# Patient Record
Sex: Male | Born: 1950
Health system: Southern US, Community
[De-identification: ages and names within clinical notes are randomized; demographics above are authoritative.]

## PROBLEM LIST (undated history)

## (undated) DIAGNOSIS — E119 Type 2 diabetes mellitus without complications: Secondary | ICD-10-CM

## (undated) DIAGNOSIS — R768 Other specified abnormal immunological findings in serum: Secondary | ICD-10-CM

## (undated) DIAGNOSIS — E669 Obesity, unspecified: Secondary | ICD-10-CM

## (undated) DIAGNOSIS — Z9119 Patient's noncompliance with other medical treatment and regimen: Secondary | ICD-10-CM

## (undated) DIAGNOSIS — R9389 Abnormal findings on diagnostic imaging of other specified body structures: Secondary | ICD-10-CM

## (undated) DIAGNOSIS — E785 Hyperlipidemia, unspecified: Secondary | ICD-10-CM

## (undated) DIAGNOSIS — I10 Essential (primary) hypertension: Secondary | ICD-10-CM

## (undated) DIAGNOSIS — I739 Peripheral vascular disease, unspecified: Secondary | ICD-10-CM

## (undated) DIAGNOSIS — M109 Gout, unspecified: Secondary | ICD-10-CM

## (undated) DIAGNOSIS — Z91199 Patient's noncompliance with other medical treatment and regimen due to unspecified reason: Secondary | ICD-10-CM

## (undated) DIAGNOSIS — Z9289 Personal history of other medical treatment: Secondary | ICD-10-CM

## (undated) HISTORY — DX: Obesity, unspecified: E66.9

## (undated) HISTORY — DX: Abnormal findings on diagnostic imaging of other specified body structures: R93.89

## (undated) HISTORY — DX: Other specified abnormal immunological findings in serum: R76.8

## (undated) HISTORY — DX: Peripheral vascular disease, unspecified: I73.9

## (undated) HISTORY — DX: Hyperlipidemia, unspecified: E78.5

## (undated) HISTORY — DX: Gout, unspecified: M10.9

## (undated) HISTORY — DX: Patient's noncompliance with other medical treatment and regimen due to unspecified reason: Z91.199

## (undated) HISTORY — DX: Patient's noncompliance with other medical treatment and regimen: Z91.19

## (undated) HISTORY — PX: BALLOON ANGIOPLASTY, ARTERY: SHX564

## (undated) HISTORY — DX: Personal history of other medical treatment: Z92.89

---

## 2014-08-26 ENCOUNTER — Emergency Department (HOSPITAL_COMMUNITY): Payer: Self-pay

## 2014-08-26 ENCOUNTER — Emergency Department (HOSPITAL_COMMUNITY)
Admission: EM | Admit: 2014-08-26 | Discharge: 2014-08-26 | Disposition: A | Discharge status: Home / Self Care | Payer: Self-pay | Attending: Emergency Medicine | Admitting: Emergency Medicine

## 2014-08-26 ENCOUNTER — Encounter (HOSPITAL_COMMUNITY): Payer: Self-pay | Admitting: Emergency Medicine

## 2014-08-26 DIAGNOSIS — I10 Essential (primary) hypertension: Secondary | ICD-10-CM | POA: Insufficient documentation

## 2014-08-26 DIAGNOSIS — R0789 Other chest pain: Secondary | ICD-10-CM | POA: Insufficient documentation

## 2014-08-26 DIAGNOSIS — E669 Obesity, unspecified: Secondary | ICD-10-CM | POA: Insufficient documentation

## 2014-08-26 DIAGNOSIS — I7090 Unspecified atherosclerosis: Secondary | ICD-10-CM | POA: Insufficient documentation

## 2014-08-26 DIAGNOSIS — R51 Headache: Secondary | ICD-10-CM | POA: Insufficient documentation

## 2014-08-26 DIAGNOSIS — R519 Headache, unspecified: Secondary | ICD-10-CM

## 2014-08-26 DIAGNOSIS — E119 Type 2 diabetes mellitus without complications: Secondary | ICD-10-CM | POA: Insufficient documentation

## 2014-08-26 DIAGNOSIS — R079 Chest pain, unspecified: Secondary | ICD-10-CM | POA: Insufficient documentation

## 2014-08-26 DIAGNOSIS — R109 Unspecified abdominal pain: Secondary | ICD-10-CM | POA: Insufficient documentation

## 2014-08-26 HISTORY — DX: Type 2 diabetes mellitus without complications: E11.9

## 2014-08-26 HISTORY — DX: Essential (primary) hypertension: I10

## 2014-08-26 LAB — CBC WITH DIFFERENTIAL/PLATELET
Basophils Absolute: 0.1 10*3/uL (ref 0.0–0.1)
Basophils Relative: 1 % (ref 0–1)
Eosinophils Absolute: 0.1 10*3/uL (ref 0.0–0.7)
Eosinophils Relative: 2 % (ref 0–5)
HEMATOCRIT: 45.5 % (ref 39.0–52.0)
Hemoglobin: 15.1 g/dL (ref 13.0–17.0)
LYMPHS PCT: 33 % (ref 12–46)
Lymphs Abs: 2.2 10*3/uL (ref 0.7–4.0)
MCH: 25.2 pg — ABNORMAL LOW (ref 26.0–34.0)
MCHC: 33.2 g/dL (ref 30.0–36.0)
MCV: 75.8 fL — AB (ref 78.0–100.0)
MONO ABS: 0.5 10*3/uL (ref 0.1–1.0)
MONOS PCT: 7 % (ref 3–12)
NEUTROS ABS: 3.8 10*3/uL (ref 1.7–7.7)
Neutrophils Relative %: 57 % (ref 43–77)
Platelets: 197 10*3/uL (ref 150–400)
RBC: 6 MIL/uL — ABNORMAL HIGH (ref 4.22–5.81)
RDW: 15 % (ref 11.5–15.5)
WBC: 6.6 10*3/uL (ref 4.0–10.5)

## 2014-08-26 LAB — COMPREHENSIVE METABOLIC PANEL
ALT: 20 U/L (ref 0–53)
ANION GAP: 17 — AB (ref 5–15)
AST: 21 U/L (ref 0–37)
Albumin: 3.7 g/dL (ref 3.5–5.2)
Alkaline Phosphatase: 123 U/L — ABNORMAL HIGH (ref 39–117)
BUN: 14 mg/dL (ref 6–23)
CO2: 23 meq/L (ref 19–32)
CREATININE: 1.18 mg/dL (ref 0.50–1.35)
Calcium: 9.3 mg/dL (ref 8.4–10.5)
Chloride: 100 mEq/L (ref 96–112)
GFR, EST AFRICAN AMERICAN: 75 mL/min — AB (ref >90)
GFR, EST NON AFRICAN AMERICAN: 64 mL/min — AB (ref >90)
Glucose, Bld: 102 mg/dL — ABNORMAL HIGH (ref 70–99)
Potassium: 3.8 mEq/L (ref 3.7–5.3)
Sodium: 140 mEq/L (ref 137–147)
Total Bilirubin: 0.4 mg/dL (ref 0.3–1.2)
Total Protein: 7.7 g/dL (ref 6.0–8.3)

## 2014-08-26 LAB — TROPONIN I

## 2014-08-26 LAB — PRO B NATRIURETIC PEPTIDE: Pro B Natriuretic peptide (BNP): 21.5 pg/mL (ref 0–125)

## 2014-08-26 LAB — LIPASE, BLOOD: Lipase: 30 U/L (ref 11–59)

## 2014-08-26 MED ORDER — HYDROCHLOROTHIAZIDE 25 MG PO TABS: 25.0000 mg | ORAL_TABLET | Freq: Every day | ORAL | Status: DC

## 2014-08-26 MED ORDER — OXYCODONE-ACETAMINOPHEN 5-325 MG PO TABS: 1.0000 | ORAL_TABLET | ORAL | Status: DC | PRN

## 2014-08-26 MED ORDER — SODIUM CHLORIDE 0.9 % IV BOLUS (SEPSIS)
1000.0000 mL | Freq: Once | INTRAVENOUS | Status: AC
  Administered 2014-08-26: 1000 mL via INTRAVENOUS

## 2014-08-26 MED ORDER — OXYCODONE-ACETAMINOPHEN 5-325 MG PO TABS
1.0000 | ORAL_TABLET | Freq: Once | ORAL | Status: AC
  Administered 2014-08-26: 1 via ORAL
  Filled 2014-08-26: qty 1

## 2014-08-26 MED ORDER — IOHEXOL 350 MG/ML SOLN
50.0000 mL | Freq: Once | INTRAVENOUS | Status: AC | PRN
  Administered 2014-08-26: 50 mL via INTRAVENOUS

## 2014-08-26 MED ORDER — SODIUM CHLORIDE 0.9 % IJ SOLN
INTRAMUSCULAR | Status: AC
  Filled 2014-08-26: qty 250

## 2014-08-26 MED ORDER — MORPHINE SULFATE 4 MG/ML IJ SOLN
4.0000 mg | Freq: Once | INTRAMUSCULAR | Status: AC
Start: 2014-08-26 — End: 2014-08-26
  Administered 2014-08-26: 4 mg via INTRAVENOUS
  Filled 2014-08-26: qty 1

## 2014-08-26 NOTE — ED Notes (Signed)
Unable to draw labs with IV access - notified phlebotomy.

## 2014-08-26 NOTE — ED Notes (Signed)
Notified CT of IV access. 

## 2014-08-26 NOTE — ED Provider Notes (Signed)
CSN: [id number]     Arrival date & time 08/26/14  0855 History   First MD Initiated Contact with Patient 08/26/14 [phone]     Chief Complaint  Patient presents with  . Chest Pain  . Shortness of Breath     (Consider location/radiation/quality/duration/timing/severity/associated sxs/prior Treatment) HPI  Patient presents with multiple complaints.  Pt with hx obesity, HTN, DM, p/w several days of intermittent lightheadedness and headache, occuring mostly at night.  The lightheadedness and the sharp shooting pains in his head are intermittent, last for only seconds to minutes at a time, and the pain occurs at various locations in his head, usually shooting up one side of his head or the other.  States this is different from his typical headache.  Does have family hx brain aneurysm.   Pt also notes for the past few weeks he has had intermittent chest pain, and intermittent SOB.  The tightness in his chest occurs randomly throughout the day, lasts 1-2 minutes.  Does not happen with exertion or with deep inspiration.  Pt notes SOB occurs after he has done his daily walk in the morning. Denies family hx CAD.      Pt notes multiple other chronic problems including intermittent numbness in his extremities that come and go, occasional pains in his abdomen after eating.  Moved to [place] from [place] one month ago.  Has seen Dr Delbert Harness but plans to switch doctors.  Is taking Lisinopril for HTN.    Past Medical History  Diagnosis Date  . Hypertension   . Diabetes mellitus without complication    History reviewed. No pertinent past surgical history. History reviewed. No pertinent family history. History  Substance Use Topics  . Smoking status: Not on file  . Smokeless tobacco: Not on file  . Alcohol Use: No    Review of Systems  All other systems reviewed and are negative.     Allergies  Review of patient's allergies indicates no known allergies.  Home Medications   Prior to Admission  medications   Not on File   BP 209/106  Pulse 82  Temp(Src) 97.8 F (36.6 C) (Oral)  Resp 15  SpO2 100% Physical Exam  Nursing note and vitals reviewed. Constitutional: He appears well-developed and well-nourished. No distress.  HENT:  Head: Normocephalic and atraumatic.  Neck: Neck supple.  Cardiovascular: Normal rate and regular rhythm.   Pulmonary/Chest: Effort normal and breath sounds normal. No respiratory distress. He has no wheezes. He has no rales.  Abdominal: Soft. He exhibits no distension. There is no tenderness. There is no rebound and no guarding.  obese  Musculoskeletal: He exhibits no edema and no tenderness.  Neurological: He is alert. He exhibits normal muscle tone.  CN II-XII intact, EOMs intact, no pronator drift, grip strengths equal bilaterally; strength 5/5 in all extremities, sensation intact in all extremities; finger to nose, heel to shin, rapid alternating movements normal; gait is normal.     Skin: He is not diaphoretic.  Psychiatric: He has a normal mood and affect. His behavior is normal.    ED Course  Procedures (including critical care time) Labs Review Labs Reviewed  CBC WITH DIFFERENTIAL - Abnormal; Notable for the following:    RBC 6.00 (*)    MCV 75.8 (*)    MCH 25.2 (*)    All other components within normal limits  COMPREHENSIVE METABOLIC PANEL - Abnormal; Notable for the following:    Glucose, Bld 102 (*)    Alkaline Phosphatase 123 (*)  GFR calc non Af Amer 64 (*)    GFR calc Af Amer 75 (*)    Anion gap 17 (*)    All other components within normal limits  TROPONIN I  LIPASE, BLOOD  PRO B NATRIURETIC PEPTIDE    Imaging Review Dg Chest 2 View  08/26/2014   CLINICAL DATA:  Chest pain, shortness of breath, leg weakness. History of hypertension and asthma.  EXAM: CHEST  2 VIEW  COMPARISON:  None.  FINDINGS: Normal cardiac silhouette. Atherosclerotic plaque within a tortuous and possibly mildly ectatic thoracic aorta. Evaluation  retrosternal clear space is obscured secondary to overlying soft tissues. No focal airspace opacities. No pleural effusion or pneumothorax. No evidence of edema. No acute osseus abnormalities.  IMPRESSION: 1.  No acute cardiopulmonary disease. 2. Atherosclerotic plaque within a tortuous and possibly ectatic thoracic aorta.   Electronically Signed   By: Simonne Come M.D.   On: 08/26/2014 10:20   Ct Head Wo Contrast  08/26/2014   CLINICAL DATA:  Left parietal headache. Left leg numbness. Hypertension.  EXAM: CT HEAD WITHOUT CONTRAST  TECHNIQUE: Contiguous axial images were obtained from the base of the skull through the vertex without intravenous contrast.  COMPARISON:  None.  FINDINGS: There is mild atrophy with diffuse sulcal prominence and symmetric prominence of the bifrontal extra-axial spaces. The gray-white differentiation is maintained. No CT evidence of acute large territory infarct. No intraparenchymal or extra-axial mass or hemorrhage. Normal size and configuration of the ventricles and basilar cisterns. No midline shift. Limited visualization the paranasal sinuses and mastoid air cells are normal. No for air-fluid levels. Regional soft tissues are normal. No displaced calvarial fracture.  IMPRESSION: Mild atrophy without acute intracranial process.   Electronically Signed   By: Simonne Come M.D.   On: 08/26/2014 10:13     EKG Interpretation   Date/Time:  :29 AM Discussed pt, workup, and plan with Dr  Rhunette Croft.   MDM   Final diagnoses:  Nonintractable episodic headache, unspecified headache type  Atypical chest pain  Intermittent abdominal pain  Atherosclerotic plaque    Afebrile nontoxic patient with HTN, DM p/w multiple complaints, mostly chronic.  Pt's main concern was lightheadedness and occasional sharp shooting pains in his head.  BP 207/115 on arrival.  Neurologically intact.   CT head negative for acute change.   Pt also noting intermittent CP and SOB that are not connected and generally occur randomly or at expected times (SOB after exercise session).  These have been ongoing for weeks.  CXR shows plaque in thoracic aorta, no acute changes.  EKG with nonspecific changes.  Troponin negative.   Pt does have risk factors but his chest pain is very atypical and his last chest pain was last night.  Labs unremarkable. Blood pressure elevated upon arrival despite patient taking his lisinopril this morning.  I treated his head pains with morphine, BP improved. Attempted to do CTA to r/o aneurysm given family hx but patient  was a very difficult stick, [place] IV placed by my attending Dr Rhunette Croft.  Unfortunately the IV contrast extravasated into his right upper arm, evaluated by radiologist.   Unable to get this study.  MRI unavailable today, scheduled as outpatient.  Pt also given neurology follow up.  PCP resources given for follow up.  D/C home with percocet and HCTZ.  Discussed result, findings, treatment, and follow up  with patient.  Pt given return precautions.  Pt verbalizes understanding and agrees with plan.       Trixie Dredge, PA-C 08/26/14 1720

## 2014-08-26 NOTE — Discharge Instructions (Signed)
Read the information below.  Use the prescribed medication as directed.  Please discuss all new medications with your pharmacist.  Do not take additional tylenol while taking the prescribed pain medication to avoid overdose.  You may return to the Emergency Department at any time for worsening condition or any new symptoms that concern you.  If you develop high fevers, worsening abdominal pain, uncontrolled vomiting, or are unable to tolerate fluids by mouth, return to the ER for a recheck.    You are having a headache. No specific cause was found today for your headache. It may have been a migraine or other cause of headache. Stress, anxiety, fatigue, and depression are common triggers for headaches. Your headache today does not appear to be life-threatening or require hospitalization, but often the exact cause of headaches is not determined in the emergency department. Therefore, follow-up with your doctor is very important to find out what may have caused your headache, and whether or not you need any further diagnostic testing or treatment. Sometimes headaches can appear benign (not harmful), but then more serious symptoms can develop which should prompt an immediate re-evaluation by your doctor or the emergency department. SEEK MEDICAL ATTENTION IF: You develop possible problems with medications prescribed.  The medications don't resolve your headache, if it recurs , or if you have multiple episodes of vomiting or can't take fluids. You have a change from the usual headache. RETURN IMMEDIATELY IF you develop a sudden, severe headache or confusion, become poorly responsive or faint, develop a fever above 100.58F or problem breathing, have a change in speech, vision, swallowing, or understanding, or develop new weakness, numbness, tingling, incoordination, or have a seizure.   Abdominal Pain Many things can cause abdominal pain. Usually, abdominal pain is not caused by a disease and will improve without  treatment. It can often be observed and treated at home. Your health care provider will do a physical exam and possibly order blood tests and X-rays to help determine the seriousness of your pain. However, in many cases, more time must pass before a clear cause of the pain can be found. Before that point, your health care provider may not know if you need more testing or further treatment. HOME CARE INSTRUCTIONS  Monitor your abdominal pain for any changes. The following actions may help to alleviate any discomfort you are experiencing:  Only take over-the-counter or prescription medicines as directed by your health care provider.  Do not take laxatives unless directed to do so by your health care provider.  Try a clear liquid diet (broth, tea, or water) as directed by your health care provider. Slowly move to a bland diet as tolerated. SEEK MEDICAL CARE IF:  You have unexplained abdominal pain.  You have abdominal pain associated with nausea or diarrhea.  You have pain when you urinate or have a bowel movement.  You experience abdominal pain that wakes you in the night.  You have abdominal pain that is worsened or improved by eating food.  You have abdominal pain that is worsened with eating fatty foods.  You have a fever. SEEK IMMEDIATE MEDICAL CARE IF:   Your pain does not go away within 2 hours.  You keep throwing up (vomiting).  Your pain is felt only in portions of the abdomen, such as the right side or the left lower portion of the abdomen.  You pass bloody or black tarry stools. MAKE SURE YOU:  Understand these instructions.   Will watch your condition.  Will get help right away if you are not doing well or get worse.  Document Released: 09/16/2005 Document Revised: 12/12/2013 Document Reviewed: 08/16/2013 [facility] Patient Information 2015 [place], [place]. This information is not intended to replace advice given to you by your health care provider. Make sure you  discuss any questions you have with your health care provider.  Chest Pain (Nonspecific) It is often hard to give a diagnosis for the cause of chest pain. There is always a chance that your pain could be related to something serious, such as a heart attack or a blood clot in the lungs. You need to follow up with your doctor. HOME CARE  If antibiotic medicine was given, take it as directed by your doctor. Finish the medicine even if you start to feel better.  For the next few days, avoid activities that bring on chest pain. Continue physical activities as told by your doctor.  Do not use any tobacco products. This includes cigarettes, chewing tobacco, and e-cigarettes.  Avoid drinking alcohol.  Only take medicine as told by your doctor.  Follow your doctor's suggestions for more testing if your chest pain does not go away.  Keep all doctor visits you made. GET HELP IF:  Your chest pain does not go away, even after treatment.  You have a rash with blisters on your chest.  You have a fever. GET HELP RIGHT AWAY IF:   You have more pain or pain that spreads to your arm, neck, jaw, back, or belly (abdomen).  You have shortness of breath.  You cough more than usual or cough up blood.  You have very bad back or belly pain.  You feel sick to your stomach (nauseous) or throw up (vomit).  You have very bad weakness.  You pass out (faint).  You have chills. This is an emergency. Do not wait to see if the problems will go away. Call your local emergency services (911 in U.S.). Do not drive yourself to the hospital. MAKE SURE YOU:   Understand these instructions.  Will watch your condition.  Will get help right away if you are not doing well or get worse. Document Released: 05/25/2008 Document Revised: 12/12/2013 Document Reviewed: 05/25/2008 [facility] Patient Information 2015 [place], [place]. This information is not intended to replace advice given to you by your health care  provider. Make sure you discuss any questions you have with your health care provider.  Atherosclerosis Atherosclerosis, or hardening of the arteries, is the buildup of plaque within the major arteries in the body. Plaque is made up of fats (lipids), cholesterol, calcium, and fibrous tissue. Plaque can narrow or block blood flow within an artery. Plaque can break off and cause damage to the affected organ. Plaque can also "rupture." When plaque ruptures within an artery, a clot can form, causing a sudden (acute) blockage of the artery. Untreated atherosclerosis can cause serious health problems or death.  RISK FACTORS  High cholesterol levels.  Smoking.  Obesity.  Lack of activity or exercise.  Eating a diet high in saturated fat.  Family history.  Diabetes. SIGNS AND SYMPTOMS  Symptoms of atherosclerosis can occur when blood flow to an artery is slowed or blocked. Severity and onset of symptoms depends on how extensive the narrowing or blockage is. A sudden plaque rupture can bring immediate, life-threatening symptoms. Atherosclerosis can affect different arteries in the body, for example:  Coronary arteries. The coronary arteries supply the heart with blood. When the coronary arteries are narrowed or  blocked from atherosclerosis, this is known as coronary artery disease (CAD). CAD can cause a heart attack. Common heart attack symptoms include:  Chest pain or pain that radiates to the neck, arm, jaw, or in the upper, middle back (mid-scapular pain).  Shortness of breath without cause.  Profuse sweating while at rest.  Irregular heartbeats.  Nausea or gastrointestinal upset.  Carotid arteries. The carotid arteries supply the brain with blood. They are located on each side of your neck. When blood flow to these arteries is slowed or blocked, a transient ischemic attack (TIA) or stroke can occur. A TIA is considered a "mini-stroke" or "warning stroke." TIA symptoms are the same as  stroke symptoms, but they are temporary and last less than 24 hours. A stroke can cause permanent damage or death. Common TIA and stroke symptoms include:  Sudden numbness or weakness to one side of your body, such as the face, arm, or leg.  Sudden confusion or trouble speaking or understanding.  Sudden trouble seeing out of one or both eyes.  Sudden trouble walking, loss of balance, or dizziness.  Sudden, severe headache with no known cause.  Arteries in the legs. When arteries in the lower legs become narrowed or blocked, this is known as peripheral vascular disease (PVD). PVD can cause a symptom called claudication. Claudication is pain or a burning feeling in your legs when walking or exercising and usually goes away with rest. Very severe PVD can cause pain in your legs while at rest.  Renal arteries. The renal arteries supply the kidneys with blood. Blockage of the renal arteries can cause a decline in kidney function or high blood pressure (hypertension).  Gastrointestinal arteries (mesenteric circulation). Abdominal pain may occur after eating. DIAGNOSIS  Your health care provider may perform the following tests to diagnose atherosclerosis:  Blood tests.  Stress test.  Echocardiogram.  Nuclear scan.  Ankle/brachial index.  Ultrasonography.  Computed tomography (CT) scan.  Angiography. TREATMENT  Atherosclerosis treatment includes the following:  Lifestyle changes such as:  Quitting smoking. Your health care provider can help you with smoking cessation.  Eating a diet low in saturated fat. A registered dietitian can educate you on healthy food options, such as helping you understand the difference between good fat and bad fat.  Following an exercise program approved by your health care provider.  Maintaining a healthy weight. Lose weight as approved by your health care provider.  Have your cholesterol levels checked as directed by your health care  provider.  Medicines. Cholesterol medicines can help slow or stop the progression of atherosclerosis.  Different procedural or surgical interventions to treat atherosclerosis include:  Balloon angioplasty. The technical name for balloon angioplasty is percutaneous transluminal angioplasty (PTA). In this procedure, a catheter with a small balloon at the tip is inserted through the blocked or narrowed artery. The balloon is then inflated. When the balloon is inflated, the fatty plaque is compressed against the artery wall, allowing better blood flow within the artery.  Balloon angioplasty and stenting. In this procedure, balloon angioplasty is combined with a stenting procedure. A stent is a small, metal mesh tube that keeps the artery open. After the artery is opened up by the balloon technique, the stent is then deployed. The stent is permanent.  Open heart surgery or bypass surgery. To perform this type of surgery, a healthy vessel is first "harvested" from either the leg or arm. The harvested vessel is then used to "bypass" the blocked atherosclerotic vessel so new blood  flow can be established.  Atherectomy. Atherectomy is a procedure that uses a catheter with a sharp blade to remove plaque from an artery. A chamber in the catheter collects the plaque.  Endarterectomy. An endarterectomy is a surgical procedure where a surgeon removes plaque from an artery.  Amputation. When blockages in the lower legs are very severe and circulation cannot be restored, amputation may be required. SEEK IMMEDIATE MEDICAL CARE IF:  You are having heart attack symptoms, such as:  Chest pain or pain that radiates to the neck, arm, jaw, or in the upper, middle back (mid-scapular pain).  Shortness of breath without cause.  Profuse sweating while at rest.  Irregular heartbeats.  Nausea or gastrointestinal upset.  You are having stroke symptoms, such as sudden:  Numbness or weakness to one side of your  body, such as the face, arm, or leg.  Confusion or trouble speaking or understanding.  Trouble seeing out of one or both eyes.  Trouble walking, loss of balance, or dizziness.  Severe headache with no known cause.  Your hands or feet are bluish, cold, or you have pain in them.  You have bad abdominal pain after eating. Symptoms of heart attack or stroke may represent a serious problem that is an emergency. Do not wait to see if the symptoms will go away. Get medical help right away. Call your local emergency services (911 in the U.S.). Do not drive yourself to the hospital. Document Released: 02/27/2004 Document Revised: 04/23/2014 Document Reviewed: 02/09/2012 [facility] Patient Information 2015 [place], [place]. This information is not intended to replace advice given to you by your health care provider. Make sure you discuss any questions you have with your health care provider.  General Headache Without Cause A general headache is pain or discomfort felt around the head or neck area. The cause may not be found.  HOME CARE   Keep all doctor visits.  Only take medicines as told by your doctor.  Lie down in a dark, quiet room when you have a headache.  Keep a journal to find out if certain things bring on headaches. For example, write down:  What you eat and drink.  How much sleep you get.  Any change to your diet or medicines.  Relax by getting a massage or doing other relaxing activities.  Put ice or heat packs on the head and neck area as told by your doctor.  Lessen stress.  Sit up straight. Do not tighten (tense) your muscles.  Quit smoking if you smoke.  Lessen how much alcohol you drink.  Lessen how much caffeine you drink, or stop drinking caffeine.  Eat and sleep on a regular schedule.  Get 7 to 9 hours of sleep, or as told by your doctor.  Keep lights dim if bright lights bother you or make your headaches worse. GET HELP RIGHT AWAY IF:   Your headache  becomes really bad.  You have a fever.  You have a stiff neck.  You have trouble seeing.  Your muscles are weak, or you lose muscle control.  You lose your balance or have trouble walking.  You feel like you will pass out (faint), or you pass out.  You have really bad symptoms that are different than your first symptoms.  You have problems with the medicines given to you by your doctor.  Your medicines do not work.  Your headache feels different than the other headaches.  You feel sick to your stomach (nauseous) or throw up (  vomit). MAKE SURE YOU:   Understand these instructions.  Will watch your condition.  Will get help right away if you are not doing well or get worse. Document Released: 09/15/2008 Document Revised: 02/29/2012 Document Reviewed: 11/27/2011 [facility] Patient Information 2015 [place], [place]. This information is not intended to replace advice given to you by your health care provider. Make sure you discuss any questions you have with your health care provider.   Emergency Department Resource Guide 1) Find a Doctor and Pay Out of Pocket Although you won't have to find out who is covered by your insurance plan, it is a good idea to ask around and get recommendations. You will then need to call the office and see if the doctor you have chosen will accept you as a new patient and what types of options they offer for patients who are self-pay. Some doctors offer discounts or will set up payment plans for their patients who do not have insurance, but you will need to ask so you aren't surprised when you get to your appointment.  2) Contact Your Local Health Department Not all health departments have doctors that can see patients for sick visits, but many do, so it is worth a call to see if yours does. If you don't know where your local health department is, you can check in your phone book. The CDC also has a tool to help you locate your state's health department,  and many state websites also have listings of all of their local health departments.  3) Find a Walk-in Clinic If your illness is not likely to be very severe or complicated, you may want to try a walk in clinic. These are popping up all over the country in pharmacies, drugstores, and shopping centers. They're usually staffed by nurse practitioners or physician assistants that have been trained to treat common illnesses and complaints. They're usually fairly quick and inexpensive. However, if you have serious medical issues or chronic medical problems, these are probably not your best option.  No Primary Care Doctor: - Call Health Connect at  [phone] - they can help you locate a primary care doctor that  accepts your insurance, provides certain services, etc. - Physician Referral Service- [phone]  Chronic Pain Problems: Organization         Address  Phone   Notes  [facility]  [phone] Patients need to be referred by their primary care doctor.   Medication Assistance: Organization         Address  Phone   Notes  [facility] Medication [facility] [address] [place] [place], [place] [address] [phone] --Must be a resident of [facility] -- Must have NO insurance coverage whatsoever (no Medicaid/ Medicare, etc.) -- The pt. MUST have a primary care doctor that directs their care regularly and follows them in the community   [facility]  [phone]   [employer]  [phone]    Agencies that provide inexpensive medical care: Organization         Address  Phone   Notes  [facility]  [phone]   [facility]    [phone]   [facility] [address] [place], [place] [address] [phone]   [facility] [address], [place] [phone]   [facility]anned Parenthood    [phone]   [facility]    [phone]   [facility]  [address], [place] Phone:  [phone], Fax:  [phone] Hours of Operation:  9 am - 6 pm, M-F.  Also accepts Medicaid/Medicare and self-pay.  [facility]  [address], [place] Phone: [phone], Fax: [phone]. Hours of Operation:  8:30 am - 5:30 pm, M-F.  Also accepts Medicaid and self-pay.  [facility] [place] [address] [place] Phone: [phone]   [facility] [address] Natasha Bence [place], [place] [phone], Ext. [phone] Mondays & Thursdays: 7-9 AM.  First 15 patients are seen on a first come, first serve basis.    Medicaid-accepting [facility] Providers:  Organization         Address  Phone   Notes  [facility] [address], [employer] [phone] Also accepts self-pay patients.  [facility] [address] Laurell Josephs [place], [place]  [phone]   [facility] [address], [place] [phone]   [facility] [address] [place] [phone]   Renaye Rakers [address], [place]   [phone] Only accepts [place] patients after they have their name applied to their card.   Self-Pay (no insurance) in [facility]:  Organization         Address  Phone   Notes  Sickle Cell Patients, [facility] Medicine [address] [place], [place] [phone]   [facility] [address] [place], [place] [phone]   [facility] [place]  [address], [place] [phone]   [facility]/Dr. Osei-Bonsu  [address], [place] or [address], [place] [phone] Phone number for both [place] and [place] locations is the same.  [facility] [address], [place] [phone]   [facility] [address] [place] or [address] [phone]   [facility] [address], [place] [phone], phone; [phone], fax Sees patients 1st and 3rd Saturday of every month.  Must not qualify for public or private insurance (i.e. Medicaid, Medicare, [place] Health Choice, Veterans' Benefits)  Household income should be no more than 200% of the poverty level The clinic cannot treat you if you are pregnant or think you are pregnant  Sexually transmitted diseases are not treated at the clinic.    Dental Care: Organization         Address  Phone  Notes  [facility] Department of [facility] [address] [place], [place] [phone] Accepts children up to age 51 who are enrolled in [place] or [place] Health Choice; pregnant women with a Medicaid card; and children who have applied for Medicaid or [place] Health Choice, but were declined, whose parents can pay a reduced fee at time of service.  [facility]  [address], [place] [phone] Accepts children up to age 37 who are enrolled in [place] or [place] Health Choice; pregnant women with a Medicaid card; and children who have applied for Medicaid or [place] Health Choice, but were declined, whose parents can pay a reduced fee at time of service.  [facility]  [address]  Lynne [place] [phone] Patients are seen by appointment only. Walk-ins are not accepted. [facility] will see patients 29 years of age and older. Monday - Tuesday (8am-5pm) Most Wednesdays (8:30-5pm) $30 per visit, cash only  [facility] Adult Dental Access PRO[facility]  [address], [place] [facility] [phone] Patients are seen by appointment only. Walk-ins are not accepted. [facility] will see patients 45 years of age and older. One Wednesday Evening (Monthly: Volunteer Based).  $30 per visit,  cash only  [employer]  [phone] for adults; Children under age 56, call Graduate Pediatric Dentistry at [phone]. Children aged 12-14, please call [phone] to request a pediatric application.  Dental services are provided in all areas of dental care including fillings, crowns and bridges, complete and partial dentures, implants, gum treatment, root canals, and extractions. Preventive care is also provided. Treatment is provided to both adults and children. Patients are selected via a lottery and there is often a waiting list.   [facility] [address], [place]  [phone] [email or web address]   [facility] [address] [place], [place] [phone], Ext. [phone] Second and Fourth Thursday of each month, opens at 6:30 AM; Clinic ends at 9 AM.  Patients are seen on a first-come first-served basis, and a limited number are seen during each clinic.   [facility]  [address] Ether Griffins [place], [place] [phone]   Eligibility Requirements You must have lived in [place], [place], or [place] counties for at least the last three months.   You cannot be eligible for state or federal sponsored [place], including [employer], [place], or [employer].   You generally cannot be eligible for healthcare insurance through your employer.    How to apply: Eligibility screenings are held every Tuesday and Wednesday afternoon from 1:00 pm until 4:00 pm. You do not need an appointment for the interview!  [facility] [address] [place], [place] [phone]   [facility]  [phone]   [facility] Health Department  [phone]   [facility]  [phone]    Behavioral Health Resources in the Community: Intensive Outpatient Programs Organization         Address  Phone  Notes  [facility] [address]  [place], [place] [phone]   [facility] Outpatient [address] [place], [place] [phone]   [facility] [address] [place], [place]  [phone]   [facility] [address]  [place], [place] [phone] or [phone]   Substance Abuse Resources Organization         Address  Phone  Notes  [facility]  [phone]   [facility]  [phone]   [facility] [place]  [phone]   Floydene Flock  [phone]   Residential & Outpatient Substance Abuse Program  [phone]   Psychological Services Organization         Address  Phone  Notes  [facility]  [phone]   [facility]  [phone]   [facility] [address] [place] [phone] or [phone]    Mobile Crisis Teams Organization         Address  Phone  Notes  [facility], Mobile Crisis Care Unit  [phone]   [facility]  [address] [place], [place] [phone]   Doristine Locks  [address] [place] [phone]    Self-Help/Support Groups Organization         Address  Phone             Notes  [facility] - variety of support groups  [phone]- [phone] Call for more information  Narcotics Anonymous (NA), [facility] [address], [employer] [place]  2 meetings at this location   Statistician         Address  Phone  Notes  [facility] [address],    [place]  [phone]   [facility]  [address], [place] [address], [place], [place] [phone]   [facility] [address] [place], [place] [phone] Admissions: 8am-3pm M-F  [facility] [address]    [place], [place] [phone]   [facility] [address] [place], [place], [place] [phone]   [facility] [address],  [place], [place] [phone]   [facility] Intensive Outpatient [address], [place], [place] [phone]   [facility] ([facility] [address]  [place], [place] [phone] or [phone]   [facility] ([facility] [address] [place], [place] [phone] Accepts Medicaid  [facility] [address],  [place] [phone] Substance Abuse/Addiction Treatment   [facility] Organization         Address  Phone  Notes  [facility]  [phone]   Angie Fava, PhD [address] [place] Kn[place], [place]   [phone] or [phone]   [facility]   [address] [place], [place] [phone]   [facility] [address], [place], [place] [phone] Insurance/Medicaid/sponsorship through [facility] and Families [address]                                    [place], [place] [phone] Therapy/tele-psych/case  [facility] [address], [place] [phone]    Dr. Lolly Mustache  [phone]   [facility]  [facility] 1) [address] [place] 2) [address] [place] 3)  [address], [place] [phone]  [phone]   [facility] Child Abuse Hotline [phone] or [phone] (After Hours)

## 2014-08-26 NOTE — Progress Notes (Signed)
Patient arrived to CT c/o pain at IV site- Tested x 2 with saline flush, stated it was "stinging." Power inj test bolus of 15ml saline (of 40 ml programmed to deliver) which infiltrated and injection was stopped.  IV was dc'd, and 1 attempt to the rt forearm made by this technologist- Patient is extremely anxious, and shaking and tensed during IV stick, despite excellent blood return, the IV was removed due to patient unable to tolerate. RN notified by other CT team member, Davis.

## 2014-08-26 NOTE — Progress Notes (Signed)
Patient ID: Charles [name], male   DOB: 1951/10/27, 63 y.o.   MRN: [id number]  50 mL Omni 350 contrast extravasation in right upper arm during CT injection.  Tissue soft.  Neuromuscular function intact.  IV removed.  Orders entered in Epic.

## 2014-08-26 NOTE — ED Notes (Signed)
Patient transported to CT 

## 2014-08-26 NOTE — ED Notes (Signed)
Dr. Rhunette Croft at bedside attempting U/S guided IV access.

## 2014-08-26 NOTE — ED Notes (Signed)
Pt has multiple complaints. Reports chest pain for extended amount of time that occurs in diff places in his chest. Has sob, headache, dizziness, left leg numbness that started  Last night. bp is 207/115, reports taking his meds as prescribed.

## 2014-08-26 NOTE — ED Notes (Signed)
Patient returned from CT

## 2014-08-26 NOTE — ED Notes (Signed)
Meal bag given  

## 2014-08-26 NOTE — ED Notes (Addendum)
IV team paged for access so patient can go for CT angio. Only access is a #22 in hand (that took 3 attempts to get).

## 2014-08-26 NOTE — ED Notes (Addendum)
Unable to do CT as the #20 IV blew during the test prior to the procedure. CT team attempted to start another IV and were unsuccessful. Notified PA.

## 2014-08-26 NOTE — ED Provider Notes (Signed)
Medical screening examination/treatment/procedure(s) were conducted as a shared visit with non-physician practitioner(s) and myself.  I personally evaluated the patient during the encounter.   EKG Interpretation   Date/Time:  Sunday August 26 2014 09:02:10 EDT Ventricular Rate:  83 PR Interval:  156 QRS Duration: 86 QT Interval:  402 QTC Calculation: 472 R Axis:   62 Text Interpretation:  Sinus rhythm with sinus arrhythmia with occasional  Premature ventricular complexes Abnormal ECG Non-specific abnormality, ST  segment, and/or T-wave No comparison Confirmed by Rhunette Croft, MD, Janey Genta  [phone]) on 08/26/2014 3:16:18 PM       Angiocath insertion Performed by: Derwood Kaplan  Consent: Verbal consent obtained. Risks and benefits: risks, benefits and alternatives were discussed Time out: Immediately prior to procedure a "time out" was called to verify the correct patient, procedure, equipment, support staff and site/side marked as required.  Preparation: Patient was prepped and draped in the usual sterile fashion.  Vein Location: right antecubital fossa  Ultrasound Guided  Gauge: 20  Normal blood return and flush without difficulty Patient tolerance: Patient tolerated the procedure well with no immediate complications.   Pt with multiple complains. Chest pain - atypical. Different locations. Has CAD risk factors, but the pain is atypical and there is no acute findings. Trops x 2 ordered. We will give him cards f/u.  H/a - no focal neuro deficits. Reports fam hx of brain AN. We will get CT - A. If no aneurysm, will get Neurology f/u.      Derwood Kaplan, MD 08/26/14 [phone]

## 2014-08-26 NOTE — ED Notes (Signed)
Patient returned from CT. Per CT, IV infiltrated with contrast. Ice pack to arm, arm is elevated. Scan unable to be done. Notified PA Chad and Dr. Rhunette Croft.

## 2014-08-26 NOTE — ED Notes (Signed)
Patient returned from CT/Xray

## 2014-08-29 ENCOUNTER — Telehealth (HOSPITAL_BASED_OUTPATIENT_CLINIC_OR_DEPARTMENT_OTHER): Payer: Self-pay | Admitting: Emergency Medicine

## 2014-09-12 ENCOUNTER — Emergency Department (HOSPITAL_COMMUNITY)
Admission: EM | Admit: 2014-09-12 | Discharge: 2014-09-12 | Disposition: A | Discharge status: Home / Self Care | Payer: Self-pay | Attending: Emergency Medicine | Admitting: Emergency Medicine

## 2014-09-12 ENCOUNTER — Encounter (HOSPITAL_COMMUNITY): Payer: Self-pay | Admitting: Emergency Medicine

## 2014-09-12 ENCOUNTER — Emergency Department (HOSPITAL_COMMUNITY): Payer: Self-pay

## 2014-09-12 DIAGNOSIS — S93401A Sprain of unspecified ligament of right ankle, initial encounter: Secondary | ICD-10-CM

## 2014-09-12 DIAGNOSIS — Y9289 Other specified places as the place of occurrence of the external cause: Secondary | ICD-10-CM | POA: Insufficient documentation

## 2014-09-12 DIAGNOSIS — Z7982 Long term (current) use of aspirin: Secondary | ICD-10-CM | POA: Insufficient documentation

## 2014-09-12 DIAGNOSIS — E119 Type 2 diabetes mellitus without complications: Secondary | ICD-10-CM | POA: Insufficient documentation

## 2014-09-12 DIAGNOSIS — S99919A Unspecified injury of unspecified ankle, initial encounter: Secondary | ICD-10-CM

## 2014-09-12 DIAGNOSIS — S8990XA Unspecified injury of unspecified lower leg, initial encounter: Secondary | ICD-10-CM | POA: Insufficient documentation

## 2014-09-12 DIAGNOSIS — Y9389 Activity, other specified: Secondary | ICD-10-CM | POA: Insufficient documentation

## 2014-09-12 DIAGNOSIS — S99929A Unspecified injury of unspecified foot, initial encounter: Secondary | ICD-10-CM

## 2014-09-12 DIAGNOSIS — X500XXA Overexertion from strenuous movement or load, initial encounter: Secondary | ICD-10-CM | POA: Insufficient documentation

## 2014-09-12 DIAGNOSIS — S93409A Sprain of unspecified ligament of unspecified ankle, initial encounter: Secondary | ICD-10-CM | POA: Insufficient documentation

## 2014-09-12 DIAGNOSIS — Z79899 Other long term (current) drug therapy: Secondary | ICD-10-CM | POA: Insufficient documentation

## 2014-09-12 DIAGNOSIS — I10 Essential (primary) hypertension: Secondary | ICD-10-CM | POA: Insufficient documentation

## 2014-09-12 MED ORDER — IBUPROFEN 400 MG PO TABS
600.0000 mg | ORAL_TABLET | Freq: Once | ORAL | Status: AC
  Administered 2014-09-12: 600 mg via ORAL
  Filled 2014-09-12 (×2): qty 1

## 2014-09-12 MED ORDER — OXYCODONE-ACETAMINOPHEN 5-325 MG PO TABS: 1.0000 | ORAL_TABLET | Freq: Four times a day (QID) | ORAL | Status: DC | PRN

## 2014-09-12 MED ORDER — IBUPROFEN 600 MG PO TABS: 600.0000 mg | ORAL_TABLET | Freq: Four times a day (QID) | ORAL | Status: DC | PRN

## 2014-09-12 MED ORDER — OXYCODONE-ACETAMINOPHEN 5-325 MG PO TABS
1.0000 | ORAL_TABLET | Freq: Once | ORAL | Status: AC
  Administered 2014-09-12: 1 via ORAL
  Filled 2014-09-12: qty 1

## 2014-09-12 NOTE — ED Notes (Signed)
Patient with elevated bp.  Denies chest pain, denies headache, denies any sx.  Patient has hx of hypertension.  He will recheck bp when he gets home  He will be taking his bp meds shortly.  Patient family aware that patient to return to emergency department if he has any concerns.  ERNP aware of same

## 2014-09-12 NOTE — ED Provider Notes (Signed)
CSN: [id number]     Arrival date & time 09/12/14  0049 History   First MD Initiated Contact with Patient 09/12/14 [phone]     Chief Complaint  Patient presents with  . Ankle Injury     (Consider location/radiation/quality/duration/timing/severity/associated sxs/prior Treatment) HPI Comments: Patient states he was coming down the stairs, when he missed a step, rolling.  His right ankle.  Initially, it was fine.  He was able to ambulate without discomfort, but over a period of several hours.  He noticed swelling, and increased pain.  He tried elevation, ice, and rest with little relief  Patient is a 63 y.o. male presenting with lower extremity injury. The history is provided by the patient.  Ankle Injury This is a new problem. The current episode started today. The problem occurs constantly. The problem has been gradually worsening. Associated symptoms include joint swelling. Pertinent negatives include no fever, numbness or weakness. The symptoms are aggravated by exertion. He has tried rest and immobilization for the symptoms. The treatment provided no relief.    Past Medical History  Diagnosis Date  . Hypertension   . Diabetes mellitus without complication    History reviewed. No pertinent past surgical history. No family history on file. History  Substance Use Topics  . Smoking status: Never Smoker   . Smokeless tobacco: Not on file  . Alcohol Use: No    Review of Systems  Constitutional: Negative for fever.  Musculoskeletal: Positive for joint swelling.  Neurological: Negative for weakness and numbness.  All other systems reviewed and are negative.     Allergies  Review of patient's allergies indicates no known allergies.  Home Medications   Prior to Admission medications   Medication Sig Start Date End Date Taking? Authorizing Provider  aspirin 81 MG chewable tablet Chew 81 mg by mouth daily.     Historical Provider, MD  hydrochlorothiazide (HYDRODIURIL) 25 MG tablet  Take 1 tablet (25 mg total) by mouth daily. 08/26/14   Trixie Dredge, PA-C  ibuprofen (ADVIL,MOTRIN) 600 MG tablet Take 1 tablet (600 mg total) by mouth every 6 (six) hours as needed. 09/12/14   Arman Filter, NP  lisinopril (PRINIVIL,ZESTRIL) 20 MG tablet Take 40 mg by mouth daily.  08/13/14   Historical Provider, MD  Multiple Vitamin (MULTIVITAMIN WITH MINERALS) TABS tablet Take 1 tablet by mouth daily.    Historical Provider, MD  nitroGLYCERIN (NITROSTAT) 0.3 MG SL tablet Place 0.3 mg under the tongue every 5 (five) minutes as needed for chest pain.  08/13/14 08/13/15  Historical Provider, MD  oxyCODONE-acetaminophen (PERCOCET/ROXICET) 5-325 MG per tablet Take 1 tablet by mouth every 4 (four) hours as needed for moderate pain or severe pain. 08/26/14   Trixie Dredge, PA-C  oxyCODONE-acetaminophen (PERCOCET/ROXICET) 5-325 MG per tablet Take 1 tablet by mouth every 6 (six) hours as needed for severe pain. 09/12/14   Arman Filter, NP   BP 165/96  Pulse 88  Temp(Src) 98.2 F (36.8 C) (Oral)  Resp 14  SpO2 96% Physical Exam  Nursing note and vitals reviewed. Constitutional: He appears well-developed and well-nourished.  HENT:  Head: Normocephalic.  Eyes: Pupils are equal, round, and reactive to light.  Neck: Normal range of motion.  Cardiovascular: Normal rate.   Pulmonary/Chest: Effort normal.  Abdominal: Soft.  Musculoskeletal: He exhibits tenderness. He exhibits no edema.       Right ankle: He exhibits decreased range of motion and swelling. He exhibits no ecchymosis. Tenderness.  Neurological: He is alert.  Skin:  Skin is warm. No erythema.    ED Course  Procedures (including critical care time) Labs Review Labs Reviewed - No data to display  Imaging Review Dg Ankle Complete Right  09/12/2014   CLINICAL DATA:  Twisted ankle, pain, swelling laterally  EXAM: RIGHT ANKLE - COMPLETE 3+ VIEW  COMPARISON:  None.  FINDINGS: Moderate lateral soft tissue swelling. Small moderate joint effusion. Mild  anterior tibiotalar osteophyte formation. No fracture or dislocation.  IMPRESSION: Ankle sprain   Electronically Signed   By: Esperanza Heir M.D.   On: 09/12/2014 01:57     EKG Interpretation None      MDM  Extra reviewed.  Negative for fracture.  Patient has been placed in an ASO, given anti-inflammatory, as well as pain control.  Patient is to followup with orthopedics as needed.  He is been given rehabilitation exercises to start in 10 days to 2 weeks Final diagnoses:  Ankle sprain, right, initial encounter         Arman Filter, NP 09/12/14 0246

## 2014-09-12 NOTE — Discharge Instructions (Signed)
Acute Ankle Sprain °with Phase I Rehab °An acute ankle sprain is a partial or complete tear in one or more of the ligaments of the ankle due to traumatic injury. The severity of the injury depends on both the number of ligaments sprained and the grade of sprain. There are 3 grades of sprains.  °· A grade 1 sprain is a mild sprain. There is a slight pull without obvious tearing. There is no loss of strength, and the muscle and ligament are the correct length. °· A grade 2 sprain is a moderate sprain. There is tearing of fibers within the substance of the ligament where it connects two bones or two cartilages. The length of the ligament is increased, and there is usually decreased strength. °· A grade 3 sprain is a complete rupture of the ligament and is uncommon. °In addition to the grade of sprain, there are three types of ankle sprains.  °Lateral ankle sprains: This is a sprain of one or more of the three ligaments on the outer side (lateral) of the ankle. These are the most common sprains. °Medial ankle sprains: There is one large triangular ligament of the inner side (medial) of the ankle that is susceptible to injury. Medial ankle sprains are less common. °Syndesmosis, "high ankle," sprains: The syndesmosis is the ligament that connects the two bones of the lower leg. Syndesmosis sprains usually only occur with very severe ankle sprains. °SYMPTOMS °· Pain, tenderness, and swelling in the ankle, starting at the side of injury that may progress to the whole ankle and foot with time. °· "Pop" or tearing sensation at the time of injury. °· Bruising that may spread to the heel. °· Impaired ability to walk soon after injury. °CAUSES  °· Acute ankle sprains are caused by trauma placed on the ankle that temporarily forces or pries the anklebone (talus) out of its normal socket. °· Stretching or tearing of the ligaments that normally hold the joint in place (usually due to a twisting injury). °RISK INCREASES  WITH: °· Previous ankle sprain. °· [place] in which the foot may land awkwardly (i.e., basketball, volleyball, or soccer) or walking or running on uneven or rough surfaces. °· Shoes with inadequate support to prevent sideways motion when stress occurs. °· Poor strength and flexibility. °· Poor balance skills. °· Contact [place]. °PREVENTION  °· Warm up and stretch properly before activity. °· Maintain physical fitness: °¨ Ankle and leg flexibility, muscle strength, and endurance. °¨ Cardiovascular fitness. °· Balance training activities. °· Use proper technique and have a coach correct improper technique. °· Taping, protective strapping, bracing, or high-top tennis shoes may help prevent injury. Initially, tape is best; however, it loses most of its support function within 10 to 15 minutes. °· Wear proper-fitted protective shoes (High-top shoes with taping or bracing is more effective than either alone). °· Provide the ankle with support during [place] and practice activities for 12 months following injury. °PROGNOSIS  °· If treated properly, ankle sprains can be expected to recover completely; however, the length of recovery depends on the degree of injury. °· A grade 1 sprain usually heals enough in 5 to 7 days to allow modified activity and requires an average of 6 weeks to heal completely. °· A grade 2 sprain requires 6 to 10 weeks to heal completely. °· A grade 3 sprain requires 12 to 16 weeks to heal. °· A syndesmosis sprain often takes more than 3 months to heal. °RELATED COMPLICATIONS  °· Frequent recurrence of symptoms may   result in a chronic problem. Appropriately addressing the problem the first time decreases the frequency of recurrence and optimizes healing time. Severity of the initial sprain does not predict the likelihood of later instability. °· Injury to other structures (bone, cartilage, or tendon). °· A chronically unstable or arthritic ankle joint is a possibility with repeated  sprains. °TREATMENT °Treatment initially involves the use of ice, medication, and compression bandages to help reduce pain and inflammation. Ankle sprains are usually immobilized in a walking cast or boot to allow for healing. Crutches may be recommended to reduce pressure on the injury. After immobilization, strengthening and stretching exercises may be necessary to regain strength and a full range of motion. Surgery is rarely needed to treat ankle sprains. °MEDICATION  °· Nonsteroidal anti-inflammatory medications, such as aspirin and ibuprofen (do not take for the first 3 days after injury or within 7 days before surgery), or other minor pain relievers, such as acetaminophen, are often recommended. Take these as directed by your caregiver. Contact your caregiver immediately if any bleeding, stomach upset, or signs of an allergic reaction occur from these medications. °· Ointments applied to the skin may be helpful. °· Pain relievers may be prescribed as necessary by your caregiver. Do not take prescription pain medication for longer than 4 to 7 days. Use only as directed and only as much as you need. °HEAT AND COLD °· Cold treatment (icing) is used to relieve pain and reduce inflammation for acute and chronic cases. Cold should be applied for 10 to 15 minutes every 2 to 3 hours for inflammation and pain and immediately after any activity that aggravates your symptoms. Use ice packs or an ice massage. °· Heat treatment may be used before performing stretching and strengthening activities prescribed by your caregiver. Use a heat pack or a warm soak. °SEEK IMMEDIATE MEDICAL CARE IF:  °· Pain, swelling, or bruising worsens despite treatment. °· You experience pain, numbness, discoloration, or coldness in the foot or toes. °· New, unexplained symptoms develop (drugs used in treatment may produce side effects.) °EXERCISES  °PHASE I EXERCISES °RANGE OF MOTION (ROM) AND STRETCHING EXERCISES - Ankle Sprain, Acute Phase I,  Weeks 1 to 2 °These exercises may help you when beginning to restore flexibility in your ankle. You will likely work on these exercises for the 1 to 2 weeks after your injury. Once your physician, physical therapist, or athletic trainer sees adequate progress, he or she will advance your exercises. While completing these exercises, remember:  °· Restoring tissue flexibility helps normal motion to return to the joints. This allows healthier, less painful movement and activity. °· An effective stretch should be held for at least 30 seconds. °· A stretch should never be painful. You should only feel a gentle lengthening or release in the stretched tissue. °RANGE OF MOTION - Dorsi/Plantar Flexion °· While sitting with your right / left knee straight, draw the top of your foot upwards by flexing your ankle. Then reverse the motion, pointing your toes downward. °· Hold each position for __________ seconds. °· After completing your first set of exercises, repeat this exercise with your knee bent. °Repeat __________ times. Complete this exercise __________ times per day.  °RANGE OF MOTION - Ankle Alphabet °· Imagine your right / left big toe is a pen. °· Keeping your hip and knee still, write out the entire alphabet with your "pen." Make the letters as large as you can without increasing any discomfort. °Repeat __________ times. Complete this exercise __________   times per day.  °STRENGTHENING EXERCISES - Ankle Sprain, Acute -Phase I, Weeks 1 to 2 °These exercises may help you when beginning to restore strength in your ankle. You will likely work on these exercises for 1 to 2 weeks after your injury. Once your physician, physical therapist, or athletic trainer sees adequate progress, he or she will advance your exercises. While completing these exercises, remember:  °· Muscles can gain both the endurance and the strength needed for everyday activities through controlled exercises. °· Complete these exercises as instructed by  your physician, physical therapist, or athletic trainer. Progress the resistance and repetitions only as guided. °· You may experience muscle soreness or fatigue, but the pain or discomfort you are trying to eliminate should never worsen during these exercises. If this pain does worsen, stop and make certain you are following the directions exactly. If the pain is still present after adjustments, discontinue the exercise until you can discuss the trouble with your clinician. °STRENGTH - Dorsiflexors °· Secure a rubber exercise band/tubing to a fixed object (i.e., table, pole) and loop the other end around your right / left foot. °· Sit on the floor facing the fixed object. The band/tubing should be slightly tense when your foot is relaxed. °· Slowly draw your foot back toward you using your ankle and toes. °· Hold this position for __________ seconds. Slowly release the tension in the band and return your foot to the starting position. °Repeat __________ times. Complete this exercise __________ times per day.  °STRENGTH - Plantar-flexors  °· Sit with your right / left leg extended. Holding onto both ends of a rubber exercise band/tubing, loop it around the ball of your foot. Keep a slight tension in the band. °· Slowly push your toes away from you, pointing them downward. °· Hold this position for __________ seconds. Return slowly, controlling the tension in the band/tubing. °Repeat __________ times. Complete this exercise __________ times per day.  °STRENGTH - Ankle Eversion °· Secure one end of a rubber exercise band/tubing to a fixed object (table, pole). Loop the other end around your foot just before your toes. °· Place your fists between your knees. This will focus your strengthening at your ankle. °· Drawing the band/tubing across your opposite foot, slowly, pull your little toe out and up. Make sure the band/tubing is positioned to resist the entire motion. °· Hold this position for __________ seconds. °Have  your muscles resist the band/tubing as it slowly pulls your foot back to the starting position.  °Repeat __________ times. Complete this exercise __________ times per day.  °STRENGTH - Ankle Inversion °· Secure one end of a rubber exercise band/tubing to a fixed object (table, pole). Loop the other end around your foot just before your toes. °· Place your fists between your knees. This will focus your strengthening at your ankle. °· Slowly, pull your big toe up and in, making sure the band/tubing is positioned to resist the entire motion. °· Hold this position for __________ seconds. °· Have your muscles resist the band/tubing as it slowly pulls your foot back to the starting position. °Repeat __________ times. Complete this exercises __________ times per day.  °STRENGTH - Towel Curls °· Sit in a chair positioned on a non-carpeted surface. °· Place your right / left foot on a towel, keeping your heel on the floor. °· Pull the towel toward your heel by only curling your toes. Keep your heel on the floor. °· If instructed by your physician, physical therapist,   or athletic trainer, add weight to the end of the towel. Repeat _____5_____ times. Complete this exercise ___2_______ times per day. Document Released: 07/08/2005 Document Revised: 04/23/2014 Document Reviewed: 03/21/2009 [facility] Patient Information 2015 [place], [place]. This information is not intended to replace advice given to you by your health care provider. Make sure you discuss any questions you have with your health care provider. Start to exercise on a 10-14 with the ankle support for at least 2 weeks, and then as needed.  You have been given a referral to Dr. Charlann Boxer, if needed.  For followup

## 2014-09-12 NOTE — Progress Notes (Signed)
Orthopedic Tech Progress Note Patient Details:  Charles [name] 04/06/1951 [id number]  Ortho Devices Type of Ortho Device: ASO Ortho Device/Splint Interventions: Application   Haskell Flirt 09/12/2014, 2:07 AM

## 2014-09-12 NOTE — ED Notes (Signed)
Pt. missed his step and twisted his right ankle yesterday afternoon , presents with pain / mild swelling at right lateral ankle .

## 2014-09-12 NOTE — ED Provider Notes (Signed)
Medical screening examination/treatment/procedure(s) were performed by non-physician practitioner and as supervising physician I was immediately available for consultation/collaboration.   EKG Interpretation None        Roshanda Balazs, MD 09/12/14 1816 

## 2014-09-12 NOTE — ED Notes (Signed)
Patient denies pain and is resting comfortably.  

## 2014-10-09 ENCOUNTER — Encounter: Payer: Self-pay | Admitting: Medical

## 2014-10-09 ENCOUNTER — Ambulatory Visit (INDEPENDENT_AMBULATORY_CARE_PROVIDER_SITE_OTHER): Payer: BC Managed Care – PPO | Admitting: Medical

## 2014-10-09 VITALS — BP 172/100 | HR 92 | Temp 98.2°F | Resp 16 | Ht 67.0 in | Wt 251.0 lb

## 2014-10-09 DIAGNOSIS — K529 Noninfective gastroenteritis and colitis, unspecified: Secondary | ICD-10-CM

## 2014-10-09 DIAGNOSIS — Z23 Encounter for immunization: Secondary | ICD-10-CM

## 2014-10-09 DIAGNOSIS — M25571 Pain in right ankle and joints of right foot: Secondary | ICD-10-CM

## 2014-10-09 DIAGNOSIS — E1165 Type 2 diabetes mellitus with hyperglycemia: Secondary | ICD-10-CM

## 2014-10-09 DIAGNOSIS — S99911A Unspecified injury of right ankle, initial encounter: Secondary | ICD-10-CM

## 2014-10-09 DIAGNOSIS — M25579 Pain in unspecified ankle and joints of unspecified foot: Secondary | ICD-10-CM | POA: Insufficient documentation

## 2014-10-09 DIAGNOSIS — IMO0002 Reserved for concepts with insufficient information to code with codable children: Secondary | ICD-10-CM

## 2014-10-09 DIAGNOSIS — I10 Essential (primary) hypertension: Secondary | ICD-10-CM

## 2014-10-09 NOTE — Progress Notes (Signed)
Subjective:   Charles [name] is a 63 y.o. male presenting on 10/09/2014 with Ankle Pain, Hypertension and establish as a new patient  Was seeing doctor in [place] prior, moved here in August to live near his sister.  Ankle injury - had right ankle sprain on 09/11/14.  He missed a step going downstairs at sister's house, landed on lateral portion of right ankle, then fell and rolled onto the ground.  Was seen in the ED for this.  Had xrays, advised he had a bad sprain, given brace, crutches, pain mediation and sent him home.   Pain is not much better other than less swelling.  Now having to use cane to walk, having daily pain, pain awakes him at night.   Taking Advil OTC, 2 tablets every 4 hours, wearing and ACE wrap.   Wore the original brace for 2 weeks.    He went to the ED 3 weeks prior to the ankle sprain for dizziness, wooziness, had run out of this BP medications . Was given script for Lisinopril and HCT.   Had eval including cardiac workup, reportedly negative.   [place] [facility] [place] [facility] [place] [facility] [place].  No proir heart disease specified.  Hypertension - diagnosed 10 years ago.  Hx/o noncompliance.   Has had problems begin without insurance at times.   Since moving here didn't have doctor or insurance.   Has only been back on BP medication since recent ED visit.  Has been on Lisinopril 40 and HCTZ 25  only in the past.    Diabetes - hasn't taken meds for 3-4 years.  Diagnosed 6-7 years ago.  Was on a tiny blue pill in the past, but made him dizzy and dropped his glucose too low.  Has been controlling with diet.  Not using diet discretion.  Last glucose was 120 in [place] in July.    He notes hx/o hyperlipidemia, wants to recheck this.  His last c/o is 3+ weeks of ongoing diarrhea.  >5-6 loose stools daily, no improvement in 3 weeks.  No fever, no blood, no recent travel, no recent exposures, no household contacts with same, no recent antibiotics.   Taking nothing  for this.     Review of Systems ROS as in subjective      Objective:  BP 172/100  Pulse 92  Temp(Src) 98.2 F (36.8 C) (Oral)  Resp 16  Ht 5\' 7"  (1.702 m)  Wt 251 lb (113.853 kg)  BMI 39.30 kg/m2  General appearance: alert, no distress, WD/WN, obese AA male HEENT: normocephalic, sclerae anicteric, TMs pearly, nares patent, no discharge or erythema, pharynx normal Oral cavity: MMM, no lesions Neck: supple, no lymphadenopathy, no thyromegaly, no masses Heart: RRR, normal S1, S2, no murmurs Lungs: CTA bilaterally, no wheezes, rhonchi, or rales Abdomen: +bs, soft, non tender, non distended, no masses, no hepatomegaly, no splenomegaly Pulses: 2+ symmetric, upper and lower extremities, normal cap refill Ext: no edema MSK: tender over entire right ankle, tender over tarsal bones, tender over medial and lateral ligaments, decreased ROM of ankle in general due to pain.  otherwise LE unremarkable Neuro: normal feet monofilament exam, normal sensation      Assessment: Encounter Diagnoses  Name Primary?  . Pain in joint, ankle and foot, right Yes  . Ankle injury, right, initial encounter   . Essential hypertension   . Diabetes type 2, uncontrolled   . Flu vaccine need   . Chronic diarrhea  Plan: Ankle and foot pain - will send for xrays.  F/u pending xrays.  For now, c/t rest, elevation, ice/heat alternatively, Aleve OTC  HTN - discused risks of hypertension, noncompliance.   Restart medications.  Diabetes type 2 - labs today.  Discussed risks of poor diabetes control, compliance, diet, exercise.   Counseled on the influenza virus vaccine.  Vaccine information sheet given.  Influenza vaccine given after consent obtained.  Diarrhea - there was some miscommunications today.   I addressed his most pressing issue - ankle pain, addressed noncompliance and uncontrolled hypertension, and reviewed history.  After I had left the room he was upset I didn't address the diarrhea  issue.   I believe he had too many complaints and expectations to be handled in his first visit, but nevertheless addresses his diarrhea issue.   He will collects stool samples, turn into lab.  discussed possible etiologies of chronic diarrhea.   F/u pending labs.   Charles [name] was seen today for ankle pain, hypertension and establish as a new patient.  Diagnoses and associated orders for this visit:  Pain in joint, ankle and foot, right - DG Ankle Complete Right; Future - DG Foot Complete Right; Future  Ankle injury, right, initial encounter - DG Ankle Complete Right; Future - DG Foot Complete Right; Future  Essential hypertension - Lipid panel  Diabetes type 2, uncontrolled - Hemoglobin A1c  Flu vaccine need - Flu Vaccine QUAD 36+ mos PF IM (Fluarix Quad PF)  Chronic diarrhea - Stool culture - Clostridium difficile EIA - Fecal Lactoferrin - Ova and parasite examination    Return pending xrays, labs.

## 2014-10-10 LAB — HEMOGLOBIN A1C
HEMOGLOBIN A1C: 6.6 % — AB (ref <5.7)
Mean Plasma Glucose: 143 mg/dL — ABNORMAL HIGH (ref <117)

## 2014-10-10 MED ORDER — HYDROCHLOROTHIAZIDE 25 MG PO TABS: 25.0000 mg | ORAL_TABLET | Freq: Every day | ORAL | Status: DC

## 2014-10-10 MED ORDER — HYDROCODONE-ACETAMINOPHEN 7.5-325 MG PO TABS: 1.0000 | ORAL_TABLET | Freq: Four times a day (QID) | ORAL | Status: DC | PRN

## 2014-10-10 MED ORDER — IBUPROFEN 600 MG PO TABS: 600.0000 mg | ORAL_TABLET | Freq: Four times a day (QID) | ORAL | Status: DC | PRN

## 2014-10-10 MED ORDER — LISINOPRIL 20 MG PO TABS: 40.0000 mg | ORAL_TABLET | Freq: Every day | ORAL | Status: DC

## 2014-10-10 NOTE — Addendum Note (Signed)
Addended by: Jac CanavanYSINGER, Kourtlynn Trevor S on: 10/10/2014 01:10 PM   Modules accepted: Orders

## 2014-10-11 ENCOUNTER — Ambulatory Visit
Admission: RE | Admit: 2014-10-11 | Discharge: 2014-10-11 | Disposition: A | Discharge status: Home / Self Care | Payer: BC Managed Care – PPO | Source: Ambulatory Visit | Attending: Medical | Admitting: Medical

## 2014-10-11 DIAGNOSIS — S99911A Unspecified injury of right ankle, initial encounter: Secondary | ICD-10-CM

## 2014-10-11 DIAGNOSIS — M25571 Pain in right ankle and joints of right foot: Secondary | ICD-10-CM

## 2014-10-15 ENCOUNTER — Telehealth: Payer: Self-pay | Admitting: Medical

## 2014-10-15 NOTE — Telephone Encounter (Signed)
Call and see if he turned in the stool for studies?

## 2014-10-16 ENCOUNTER — Other Ambulatory Visit: Payer: Self-pay | Admitting: Medical

## 2014-10-16 NOTE — Telephone Encounter (Signed)
Patient has not returned the stool samples yet. He states that he will try and return them this week.

## 2014-10-17 ENCOUNTER — Other Ambulatory Visit: Payer: Self-pay | Admitting: Medical

## 2014-10-17 LAB — C. DIFFICILE GDH AND TOXIN A/B
C. DIFF TOXIN A/B: NOT DETECTED
C. difficile GDH: NOT DETECTED

## 2014-10-17 LAB — FECAL LACTOFERRIN, QUANT: Lactoferrin: POSITIVE

## 2014-10-17 LAB — OVA AND PARASITE EXAMINATION: OP: NONE SEEN

## 2014-10-17 MED ORDER — CIPROFLOXACIN HCL 250 MG PO TABS: 250.0000 mg | ORAL_TABLET | Freq: Two times a day (BID) | ORAL | Status: DC

## 2014-10-17 MED ORDER — CIPROFLOXACIN HCL 500 MG PO TABS: 500.0000 mg | ORAL_TABLET | Freq: Two times a day (BID) | ORAL | Status: DC

## 2014-10-20 LAB — STOOL CULTURE

## 2014-10-23 ENCOUNTER — Encounter: Payer: Self-pay | Admitting: Medical

## 2014-10-23 ENCOUNTER — Other Ambulatory Visit: Payer: Self-pay | Admitting: Family Medicine

## 2014-10-23 ENCOUNTER — Telehealth: Payer: Self-pay | Admitting: Medical

## 2014-10-23 ENCOUNTER — Ambulatory Visit (INDEPENDENT_AMBULATORY_CARE_PROVIDER_SITE_OTHER): Payer: BC Managed Care – PPO | Admitting: Medical

## 2014-10-23 VITALS — BP 150/100 | HR 92 | Temp 98.2°F | Resp 16 | Wt 252.0 lb

## 2014-10-23 DIAGNOSIS — I700 Atherosclerosis of aorta: Secondary | ICD-10-CM

## 2014-10-23 DIAGNOSIS — K529 Noninfective gastroenteritis and colitis, unspecified: Secondary | ICD-10-CM

## 2014-10-23 DIAGNOSIS — E1159 Type 2 diabetes mellitus with other circulatory complications: Secondary | ICD-10-CM | POA: Insufficient documentation

## 2014-10-23 DIAGNOSIS — R197 Diarrhea, unspecified: Secondary | ICD-10-CM

## 2014-10-23 DIAGNOSIS — I10 Essential (primary) hypertension: Secondary | ICD-10-CM

## 2014-10-23 DIAGNOSIS — F1990 Other psychoactive substance use, unspecified, uncomplicated: Secondary | ICD-10-CM

## 2014-10-23 DIAGNOSIS — E119 Type 2 diabetes mellitus without complications: Secondary | ICD-10-CM

## 2014-10-23 DIAGNOSIS — F1911 Other psychoactive substance abuse, in remission: Secondary | ICD-10-CM

## 2014-10-23 DIAGNOSIS — M1009 Idiopathic gout, multiple sites: Secondary | ICD-10-CM

## 2014-10-23 DIAGNOSIS — H18413 Arcus senilis, bilateral: Secondary | ICD-10-CM

## 2014-10-23 DIAGNOSIS — M2550 Pain in unspecified joint: Secondary | ICD-10-CM

## 2014-10-23 DIAGNOSIS — Z1211 Encounter for screening for malignant neoplasm of colon: Secondary | ICD-10-CM

## 2014-10-23 DIAGNOSIS — Z8619 Personal history of other infectious and parasitic diseases: Secondary | ICD-10-CM

## 2014-10-23 LAB — LIPID PANEL
CHOLESTEROL: 195 mg/dL (ref 0–200)
HDL: 51 mg/dL (ref >39)
LDL Cholesterol: 122 mg/dL — ABNORMAL HIGH (ref 0–99)
Total CHOL/HDL Ratio: 3.8 Ratio
Triglycerides: 111 mg/dL (ref <150)
VLDL: 22 mg/dL (ref 0–40)

## 2014-10-23 LAB — URIC ACID: Uric Acid, Serum: 8.1 mg/dL — ABNORMAL HIGH (ref 4.0–7.8)

## 2014-10-23 MED ORDER — AMLODIPINE BESYLATE 10 MG PO TABS: 10.0000 mg | ORAL_TABLET | Freq: Every day | ORAL | Status: DC

## 2014-10-23 NOTE — Telephone Encounter (Signed)
Refer to Dr. Elnoria HowardHung for chronic diarrhea and colon cancer screening

## 2014-10-23 NOTE — Telephone Encounter (Signed)
Patient has an appointment to see Dr. Elnoria HowardHung on 11/05/14 @ 1000 am . [phone]

## 2014-10-23 NOTE — Patient Instructions (Signed)
Thank you for giving me the opportunity to serve you today.    Your diagnosis today includes: Encounter Diagnoses  Name Primary?  . Essential hypertension Yes  . Diabetes type 2, controlled   . Acute idiopathic gout of multiple sites   . History of hepatitis B   . Atherosclerosis of aorta   . Polyarthralgia   . Arcus senilis of both eyes   . Chronic diarrhea   . Special screening for malignant neoplasms, colon   . History of drug abuse      Specific recommendations today include:  Continue lisinopril 20 mg 2 tablets daily for blood pressure  For now continue hydrochlorothiazide but we may change this depending on your gout labs  Begin new blood pressure medication amlodipine 10 mg daily  We'll call with lab results  We will refer you to Dr. Elnoria HowardHung gastroenterology for chronic diarrhea and for your first screening colonoscopy  For now take a daily probiotic such as align over-the-counter for the diarrhea  Pending labs I will probably start you on a cholesterol medication to reduce cholesterol and lower your risk of heart disease  An x-ray you had done in September showed atherosclerosis of the aorta  We will also consider a baseline cardiac stress test at some point going forward  Return pending labs.    I have included other useful information below for your review.  Atherosclerosis Atherosclerosis, or hardening of the arteries, is the buildup of plaque within the major arteries in the body. Plaque is made up of fats (lipids), cholesterol, calcium, and fibrous tissue. Plaque can narrow or block blood flow within an artery. Plaque can break off and cause damage to the affected organ. Plaque can also "rupture." When plaque ruptures within an artery, a clot can form, causing a sudden (acute) blockage of the artery. Untreated atherosclerosis can cause serious health problems or death.  RISK FACTORS  High cholesterol levels.  Smoking.  Obesity.  Lack of activity or  exercise.  Eating a diet high in saturated fat.  Family history.  Diabetes. SIGNS AND SYMPTOMS  Symptoms of atherosclerosis can occur when blood flow to an artery is slowed or blocked. Severity and onset of symptoms depends on how extensive the narrowing or blockage is. A sudden plaque rupture can bring immediate, life-threatening symptoms. Atherosclerosis can affect different arteries in the body, for example:  Coronary arteries. The coronary arteries supply the heart with blood. When the coronary arteries are narrowed or blocked from atherosclerosis, this is known as coronary artery disease (CAD). CAD can cause a heart attack. Common heart attack symptoms include:  Chest pain or pain that radiates to the neck, arm, jaw, or in the upper, middle back (mid-scapular pain).  Shortness of breath without cause.  Profuse sweating while at rest.  Irregular heartbeats.  Nausea or gastrointestinal upset.  Carotid arteries. The carotid arteries supply the brain with blood. They are located on each side of your neck. When blood flow to these arteries is slowed or blocked, a transient ischemic attack (TIA) or stroke can occur. A TIA is considered a "mini-stroke" or "warning stroke." TIA symptoms are the same as stroke symptoms, but they are temporary and last less than 24 hours. A stroke can cause permanent damage or death. Common TIA and stroke symptoms include:  Sudden numbness or weakness to one side of your body, such as the face, arm, or leg.  Sudden confusion or trouble speaking or understanding.  Sudden trouble seeing out of one or  both eyes.  Sudden trouble walking, loss of balance, or dizziness.  Sudden, severe headache with no known cause.  Arteries in the legs. When arteries in the lower legs become narrowed or blocked, this is known as peripheral vascular disease (PVD). PVD can cause a symptom called claudication. Claudication is pain or a burning feeling in your legs when walking  or exercising and usually goes away with rest. Very severe PVD can cause pain in your legs while at rest.  Renal arteries. The renal arteries supply the kidneys with blood. Blockage of the renal arteries can cause a decline in kidney function or high blood pressure (hypertension).  Gastrointestinal arteries (mesenteric circulation). Abdominal pain may occur after eating. DIAGNOSIS  Your health care provider may perform the following tests to diagnose atherosclerosis:  Blood tests.  Stress test.  Echocardiogram.  Nuclear scan.  Ankle/brachial index.  Ultrasonography.  Computed tomography (CT) scan.  Angiography. TREATMENT  Atherosclerosis treatment includes the following:  Lifestyle changes such as:  Quitting smoking. Your health care provider can help you with smoking cessation.  Eating a diet low in saturated fat. A registered dietitian can educate you on healthy food options, such as helping you understand the difference between good fat and bad fat.  Following an exercise program approved by your health care provider.  Maintaining a healthy weight. Lose weight as approved by your health care provider.  Have your cholesterol levels checked as directed by your health care provider.  Medicines. Cholesterol medicines can help slow or stop the progression of atherosclerosis.  Different procedural or surgical interventions to treat atherosclerosis include:  Balloon angioplasty. The technical name for balloon angioplasty is percutaneous transluminal angioplasty (PTA). In this procedure, a catheter with a small balloon at the tip is inserted through the blocked or narrowed artery. The balloon is then inflated. When the balloon is inflated, the fatty plaque is compressed against the artery wall, allowing better blood flow within the artery.  Balloon angioplasty and stenting. In this procedure, balloon angioplasty is combined with a stenting procedure. A stent is a small, metal  mesh tube that keeps the artery open. After the artery is opened up by the balloon technique, the stent is then deployed. The stent is permanent.  Open heart surgery or bypass surgery. To perform this type of surgery, a healthy vessel is first "harvested" from either the leg or arm. The harvested vessel is then used to "bypass" the blocked atherosclerotic vessel so new blood flow can be established.  Atherectomy. Atherectomy is a procedure that uses a catheter with a sharp blade to remove plaque from an artery. A chamber in the catheter collects the plaque.  Endarterectomy. An endarterectomy is a surgical procedure where a surgeon removes plaque from an artery.  Amputation. When blockages in the lower legs are very severe and circulation cannot be restored, amputation may be required. SEEK IMMEDIATE MEDICAL CARE IF:  You are having heart attack symptoms, such as:  Chest pain or pain that radiates to the neck, arm, jaw, or in the upper, middle back (mid-scapular pain).  Shortness of breath without cause.  Profuse sweating while at rest.  Irregular heartbeats.  Nausea or gastrointestinal upset.  You are having stroke symptoms, such as sudden:  Numbness or weakness to one side of your body, such as the face, arm, or leg.  Confusion or trouble speaking or understanding.  Trouble seeing out of one or both eyes.  Trouble walking, loss of balance, or dizziness.  Severe headache with  no known cause.  Your hands or feet are bluish, cold, or you have pain in them.  You have bad abdominal pain after eating. Symptoms of heart attack or stroke may represent a serious problem that is an emergency. Do not wait to see if the symptoms will go away. Get medical help right away. Call your local emergency services (911 in the [place] Do not drive yourself to the hospital. Document Released: 02/27/2004 Document Revised: 04/23/2014 Document Reviewed: 02/09/2012 [facility] Patient Information 2015  [place], [place]. This information is not intended to replace advice given to you by your health care provider. Make sure you discuss any questions you have with your health care provider.   Gout Gout is an inflammatory arthritis caused by a buildup of uric acid crystals in the joints. Uric acid is a chemical that is normally present in the blood. When the level of uric acid in the blood is too high it can form crystals that deposit in your joints and tissues. This causes joint redness, soreness, and swelling (inflammation). Repeat attacks are common. Over time, uric acid crystals can form into masses (tophi) near a joint, destroying bone and causing disfigurement. Gout is treatable and often preventable. CAUSES  The disease begins with elevated levels of uric acid in the blood. Uric acid is produced by your body when it breaks down a naturally found substance called purines. Certain foods you eat, such as meats and fish, contain high amounts of purines. Causes of an elevated uric acid level include:  Being passed down from parent to child (heredity).  Diseases that cause increased uric acid production (such as obesity, psoriasis, and certain cancers).  Excessive alcohol use.  Diet, especially diets rich in meat and seafood.  Medicines, including certain cancer-fighting medicines (chemotherapy), water pills (diuretics), and aspirin.  Chronic kidney disease. The kidneys are no longer able to remove uric acid well.  Problems with metabolism. Conditions strongly associated with gout include:  Obesity.  High blood pressure.  High cholesterol.  Diabetes. Not everyone with elevated uric acid levels gets gout. It is not understood why some people get gout and others do not. Surgery, joint injury, and eating too much of certain foods are some of the factors that can lead to gout attacks. SYMPTOMS   An attack of gout comes on quickly. It causes intense pain with redness, swelling, and warmth in  a joint.  Fever can occur.  Often, only one joint is involved. Certain joints are more commonly involved:  Base of the big toe.  Knee.  Ankle.  Wrist.  Finger. Without treatment, an attack usually goes away in a few days to weeks. Between attacks, you usually will not have symptoms, which is different from many other forms of arthritis. DIAGNOSIS  Your caregiver will suspect gout based on your symptoms and exam. In some cases, tests may be recommended. The tests may include:  Blood tests.  Urine tests.  X-rays.  Joint fluid exam. This exam requires a needle to remove fluid from the joint (arthrocentesis). Using a microscope, gout is confirmed when uric acid crystals are seen in the joint fluid. TREATMENT  There are two phases to gout treatment: treating the sudden onset (acute) attack and preventing attacks (prophylaxis).  Treatment of an Acute Attack.  Medicines are used. These include anti-inflammatory medicines or steroid medicines.  An injection of steroid medicine into the affected joint is sometimes necessary.  The painful joint is rested. Movement can worsen the arthritis.  You may use  warm or cold treatments on painful joints, depending which works best for you.  Treatment to Prevent Attacks.  If you suffer from frequent gout attacks, your caregiver may advise preventive medicine. These medicines are started after the acute attack subsides. These medicines either help your kidneys eliminate uric acid from your body or decrease your uric acid production. You may need to stay on these medicines for a very long time.  The early phase of treatment with preventive medicine can be associated with an increase in acute gout attacks. For this reason, during the first few months of treatment, your caregiver may also advise you to take medicines usually used for acute gout treatment. Be sure you understand your caregiver's directions. Your caregiver may make several  adjustments to your medicine dose before these medicines are effective.  Discuss dietary treatment with your caregiver or dietitian. Alcohol and drinks high in sugar and fructose and foods such as meat, poultry, and seafood can increase uric acid levels. Your caregiver or dietitian can advise you on drinks and foods that should be limited. HOME CARE INSTRUCTIONS   Do not take aspirin to relieve pain. This raises uric acid levels.  Only take over-the-counter or prescription medicines for pain, discomfort, or fever as directed by your caregiver.  Rest the joint as much as possible. When in bed, keep sheets and blankets off painful areas.  Keep the affected joint raised (elevated).  Apply warm or cold treatments to painful joints. Use of warm or cold treatments depends on which works best for you.  Use crutches if the painful joint is in your leg.  Drink enough fluids to keep your urine clear or pale yellow. This helps your body get rid of uric acid. Limit alcohol, sugary drinks, and fructose drinks.  Follow your dietary instructions. Pay careful attention to the amount of protein you eat. Your daily diet should emphasize fruits, vegetables, whole grains, and fat-free or low-fat milk products. Discuss the use of coffee, vitamin C, and cherries with your caregiver or dietitian. These may be helpful in lowering uric acid levels.  Maintain a healthy body weight. SEEK MEDICAL CARE IF:   You develop diarrhea, vomiting, or any side effects from medicines.  You do not feel better in 24 hours, or you are getting worse. SEEK IMMEDIATE MEDICAL CARE IF:   Your joint becomes suddenly more tender, and you have chills or a fever. MAKE SURE YOU:   Understand these instructions.  Will watch your condition.  Will get help right away if you are not doing well or get worse. Document Released: 12/04/2000 Document Revised: 04/23/2014 Document Reviewed: 07/20/2012 [facility] Patient Information 2015  [place], [place]. This information is not intended to replace advice given to you by your health care provider. Make sure you discuss any questions you have with your health care provider.    Chronic Diarrhea Diarrhea is frequent loose and watery bowel movements. It can cause you to feel weak and dehydrated. Dehydration can cause you to become tired and thirsty and to have a dry mouth, decreased urination, and dark yellow urine. Diarrhea is a sign of another problem, most often an infection that will not last long. In most cases, diarrhea lasts 2-3 days. Diarrhea that lasts longer than 4 weeks is called long-lasting (chronic) diarrhea. It is important to treat your diarrhea as directed by your health care provider to lessen or prevent future episodes of diarrhea.  CAUSES  There are many causes of chronic diarrhea. The following are some  possible causes:   Gastrointestinal infections caused by viruses, bacteria, or parasites.   Food poisoning or food allergies.   Certain medicines, such as antibiotics, chemotherapy, and laxatives.   Artificial sweeteners and fructose.   Digestive disorders, such as celiac disease and inflammatory bowel diseases.   Irritable bowel syndrome.  Some disorders of the pancreas.  Disorders of the thyroid.  Reduced blood flow to the intestines.  Cancer. Sometimes the cause of chronic diarrhea is unknown. RISK FACTORS  Having a severely weakened immune system, such as from HIV or AIDS.   Taking certain types of cancer-fighting drugs (such as with chemotherapy) or other medicines.   Having had a recent organ transplant.   Having a portion of the stomach or small bowel removed.   Traveling to countries where food and water supplies are often contaminated.  SYMPTOMS  In addition to frequent, loose stools, diarrhea may cause:   Cramping.   Abdominal pain.   Nausea.   Fever.  Fatigue.  Urgent need to use the bathroom.  Loss of bowel  control. DIAGNOSIS  Your health care provider must take a careful history and perform a physical exam. Tests given are based on your symptoms and history. Tests may include:   Blood or stool tests. Three or more stool samples may be examined. Stool cultures may be used to test for bacteria or parasites.   X-rays.   A procedure in which a thin tube is inserted into the mouth or rectum (endoscopy). This allows the health care provider to look inside the intestine.  TREATMENT   Treatment is aimed at correcting the cause of the diarrhea when possible.  Diarrhea caused by an infection can often be treated with antibiotic medicines.  Diarrhea not caused by an infection may require you to take long-term medicine or have surgery. Specific treatment should be discussed with your health care provider.  If the cause cannot be determined, treatment aims to relieve symptoms and prevent dehydration. Serious health problems can occur if you do not maintain proper fluid levels. Treatment may include:  Taking an oral rehydration solution (ORS).  Not drinking beverages that contain caffeine (such as tea, coffee, and soft drinks).  Not drinking alcohol.  Maintaining well-balanced nutrition to help you recover faster. HOME CARE INSTRUCTIONS   Drink enough fluids to keep urine clear or pale yellow. Drink 1 cup (8 oz) of fluid for each diarrhea episode. Avoid fluids that contain simple sugars, fruit juices, whole milk products, and sodas. Hydrate with an ORS. You may purchase the ORS or prepare it at home by mixing the following ingredients together:   - tsp (1.7-3  mL) table salt.   tsp (3  mL) baking soda.   tsp (1.7 mL) salt substitute containing potassium chloride.  1 tbsp (20 mL) sugar.  4.2 c (1 L) of water.   Certain foods and beverages may increase the speed at which food moves through the gastrointestinal (GI) tract. These foods and beverages should be avoided. They  include:  Caffeinated and alcoholic beverages.  High-fiber foods, such as raw fruits and vegetables, nuts, seeds, and whole grain breads and cereals.  Foods and beverages sweetened with sugar alcohols, such as xylitol, sorbitol, and mannitol.   Some foods may be well tolerated and may help thicken stool. These include:  Starchy foods, such as rice, toast, pasta, low-sugar cereal, oatmeal, grits, baked potatoes, crackers, and bagels.  Bananas.  Applesauce.  Add probiotic-rich foods to help increase healthy bacteria in the GI  tract. These include yogurt and fermented milk products.  Wash your hands well after each diarrhea episode.  Only take over-the-counter or prescription medicines as directed by your health care provider.  Take a warm bath to relieve any burning or pain from frequent diarrhea episodes. SEEK MEDICAL CARE IF:   You are not urinating as often.  Your urine is a dark color.  You become very tired or dizzy.  You have severe pain in the abdomen or rectum.  Your have blood or pus in your stools.  Your stools look black and tarry. SEEK IMMEDIATE MEDICAL CARE IF:   You are unable to keep fluids down.  You have persistent vomiting.  You have blood in your stool.  Your stools are black and tarry.  You do not urinate in 6-8 hours, or there is only a small amount of very dark urine.  You have abdominal pain that increases or localizes.  You have weakness, dizziness, confusion, or lightheadedness.  You have a severe headache.  Your diarrhea gets worse or does not get better.  You have a fever or persistent symptoms for more than 2-3 days.  You have a fever and your symptoms suddenly get worse. MAKE SURE YOU:   Understand these instructions.  Will watch your condition.  Will get help right away if you are not doing well or get worse. Document Released: 02/27/2004 Document Revised: 12/12/2013 Document Reviewed: 06/01/2013 [facility] Patient  Information 2015 [place]. This information is not intended to replace advice given to you by your health care provider. Make sure you discuss any questions you have with your health care provider.

## 2014-10-23 NOTE — Progress Notes (Signed)
Subjective:   Charles [name] is a 63 y.o. male presenting on 10/23/2014 with Follow-up  here for recheck.  I saw him recently for a new patient for multiple concerns, and he returns to discuss further this multiple concerns.    Chest x-ray back in September showed atherosclerosis, he knows no prior medication for lipids. He has been a diabetic for many years  Hypertension, compliant with lisinopril 40 mg daily, HCTZ 25 mg daily for blood pressure not at goal  Since last visit his ankle and foot pain is a little better but still given him problems but right after he last saw us, his right hand and second and third fingers at the MCP's swollen up and was painful. He continues to have pain but less swelling decreased range of motion of the same area.  He notes today that he has a history of gout. Was on allopurinol in the remote past.  Does know that alcohol and meat aggravate the gout.  His diarrhea is somewhat improved from the recent Cipro, here to discuss the stool studies next steps  No prior colonoscopy  He has concerns about his liver, history of hepatitis B in the remote past.  No other aggravating or relieving factors.  No other complaint.  Review of Systems ROS as in subjective      Objective:   Filed Vitals:   10/23/14 1038  BP: 150/100  Pulse: 92  Temp: 98.2 F (36.8 C)  Resp: 16    General appearance: alert, no distress, WD/WN bilat outer cornea with bluish circumferential hue Neck: supple, no lymphadenopathy, no thyromegaly, no masses, no bruits Heart: RRR, normal S1, S2, no murmurs Lungs: CTA bilaterally, no wheezes, rhonchi, or rales Abdomen: +bs, soft, non tender, non distended, no masses, no hepatomegaly, no splenomegaly Pulses: 2+ symmetric, upper and lower extremities, normal cap refill Ext: no edema MSK: right 2nd and 3rd MCPs with sweling and pain and decreased ROM      Assessment: Encounter Diagnoses  Name Primary?  . Essential hypertension  Yes  . Diabetes type 2, controlled   . Acute idiopathic gout of multiple sites   . History of hepatitis B   . Atherosclerosis of aorta   . Polyarthralgia   . Arcus senilis of both eyes   . Chronic diarrhea   . Special screening for malignant neoplasms, colon   . History of drug abuse      Plan: We discussed his variety of concerns.  Added amlodipine for hypertension today may end up stopping HCTZ depending on gout concerns. Pending labs we'll likely add back on allopurinol. Referral to GI. Additional labs today.  Specific recommendations today include:  Continue lisinopril 20 mg 2 tablets daily for blood pressure  For now continue hydrochlorothiazide but we may change this depending on your gout labs  Begin new blood pressure medication amlodipine 10 mg daily  We'll call with lab results  We will refer you to Dr. Elnoria HowardHung gastroenterology for chronic diarrhea and for your first screening colonoscopy  For now take a daily probiotic such as align over-the-counter for the diarrhea  Pending labs I will probably start you on a cholesterol medication to reduce cholesterol and lower your risk of heart disease  An x-ray you had done in September showed atherosclerosis of the aorta  We will also consider a baseline cardiac stress test at some point going forward  Ethelene Brownsnthony was seen today for follow-up.  Diagnoses and associated orders for this visit:  Essential hypertension -  Lipid panel  Diabetes type 2, controlled  Acute idiopathic gout of multiple sites - Uric acid  History of hepatitis B - Hepatitis B core antibody, IgM - Hepatitis B surface antibody - Hepatitis B surface antigen - Hepatitis C antibody - HIV antibody - RPR  Atherosclerosis of aorta  Polyarthralgia  Arcus senilis of both eyes  Chronic diarrhea - Ambulatory referral to Gastroenterology  Special screening for malignant neoplasms, colon - Ambulatory referral to Gastroenterology  History of drug  abuse - Hepatitis B core antibody, IgM - Hepatitis B surface antibody - Hepatitis B surface antigen - Hepatitis C antibody - HIV antibody - RPR    Return pending labs.

## 2014-10-24 ENCOUNTER — Other Ambulatory Visit: Payer: Self-pay | Admitting: Medical

## 2014-10-24 LAB — HEPATITIS B SURFACE ANTIGEN: HEP B S AG: NEGATIVE

## 2014-10-24 LAB — HEPATITIS C ANTIBODY: HCV AB: REACTIVE — AB

## 2014-10-24 LAB — HIV ANTIBODY (ROUTINE TESTING W REFLEX): HIV 1&2 Ab, 4th Generation: NONREACTIVE

## 2014-10-24 LAB — HEPATITIS B CORE ANTIBODY, IGM: Hep B C IgM: NONREACTIVE

## 2014-10-24 LAB — HEPATITIS B SURFACE ANTIBODY, QUANTITATIVE: Hepatitis B-Post: 0.1 m[IU]/mL

## 2014-10-24 LAB — RPR

## 2014-10-24 MED ORDER — ROSUVASTATIN CALCIUM 10 MG PO TABS: 10.0000 mg | ORAL_TABLET | Freq: Every day | ORAL | Status: DC

## 2014-10-24 MED ORDER — METHYLPREDNISOLONE (PAK) 4 MG PO TABS: ORAL_TABLET | ORAL | Status: DC

## 2014-10-24 MED ORDER — ALLOPURINOL 300 MG PO TABS: 300.0000 mg | ORAL_TABLET | Freq: Every day | ORAL | Status: DC

## 2014-10-24 NOTE — Telephone Encounter (Signed)
Patient is aware of his appointment to see Dr. Elnoria HowardHung.

## 2014-12-04 LAB — HM COLONOSCOPY

## 2014-12-18 ENCOUNTER — Encounter: Payer: Self-pay | Admitting: Medical

## 2014-12-21 HISTORY — PX: COLONOSCOPY: SHX174

## 2015-01-18 ENCOUNTER — Ambulatory Visit (INDEPENDENT_AMBULATORY_CARE_PROVIDER_SITE_OTHER): Payer: BLUE CROSS/BLUE SHIELD | Admitting: Medical

## 2015-01-18 VITALS — BP 122/80 | HR 102 | Temp 98.1°F | Resp 14 | Wt 250.0 lb

## 2015-01-18 DIAGNOSIS — R7309 Other abnormal glucose: Secondary | ICD-10-CM

## 2015-01-18 DIAGNOSIS — E669 Obesity, unspecified: Secondary | ICD-10-CM

## 2015-01-18 DIAGNOSIS — Z23 Encounter for immunization: Secondary | ICD-10-CM

## 2015-01-18 DIAGNOSIS — B1920 Unspecified viral hepatitis C without hepatic coma: Secondary | ICD-10-CM

## 2015-01-18 DIAGNOSIS — I10 Essential (primary) hypertension: Secondary | ICD-10-CM

## 2015-01-18 DIAGNOSIS — R358 Other polyuria: Secondary | ICD-10-CM

## 2015-01-18 DIAGNOSIS — R3589 Other polyuria: Secondary | ICD-10-CM

## 2015-01-18 DIAGNOSIS — IMO0002 Reserved for concepts with insufficient information to code with codable children: Secondary | ICD-10-CM

## 2015-01-18 DIAGNOSIS — R631 Polydipsia: Secondary | ICD-10-CM

## 2015-01-18 DIAGNOSIS — E785 Hyperlipidemia, unspecified: Secondary | ICD-10-CM

## 2015-01-18 DIAGNOSIS — E1165 Type 2 diabetes mellitus with hyperglycemia: Secondary | ICD-10-CM

## 2015-01-18 LAB — POCT URINALYSIS DIPSTICK
Bilirubin, UA: NEGATIVE
Blood, UA: NEGATIVE
LEUKOCYTES UA: NEGATIVE
NITRITE UA: NEGATIVE
Spec Grav, UA: >=1.03
UROBILINOGEN UA: NEGATIVE
pH, UA: 6

## 2015-01-18 LAB — CBC WITH DIFFERENTIAL/PLATELET
Basophils Absolute: 0 10*3/uL (ref 0.0–0.1)
Basophils Relative: 0 % (ref 0–1)
Eosinophils Absolute: 0.2 10*3/uL (ref 0.0–0.7)
Eosinophils Relative: 2 % (ref 0–5)
HEMATOCRIT: 46 % (ref 39.0–52.0)
HEMOGLOBIN: 15.4 g/dL (ref 13.0–17.0)
LYMPHS ABS: 2.5 10*3/uL (ref 0.7–4.0)
Lymphocytes Relative: 28 % (ref 12–46)
MCH: 25.6 pg — AB (ref 26.0–34.0)
MCHC: 33.5 g/dL (ref 30.0–36.0)
MCV: 76.4 fL — ABNORMAL LOW (ref 78.0–100.0)
MONO ABS: 0.4 10*3/uL (ref 0.1–1.0)
MONOS PCT: 5 % (ref 3–12)
MPV: 10.2 fL (ref 8.6–12.4)
NEUTROS ABS: 5.8 10*3/uL (ref 1.7–7.7)
Neutrophils Relative %: 65 % (ref 43–77)
Platelets: 216 10*3/uL (ref 150–400)
RBC: 6.02 MIL/uL — ABNORMAL HIGH (ref 4.22–5.81)
RDW: 15.8 % — ABNORMAL HIGH (ref 11.5–15.5)
WBC: 8.9 10*3/uL (ref 4.0–10.5)

## 2015-01-18 LAB — POCT CBG (FASTING - GLUCOSE)-MANUAL ENTRY: GLUCOSE FASTING, POC: 202 mg/dL — AB (ref 70–99)

## 2015-01-18 LAB — BASIC METABOLIC PANEL
BUN: 15 mg/dL (ref 6–23)
CALCIUM: 9.8 mg/dL (ref 8.4–10.5)
CO2: 23 meq/L (ref 19–32)
CREATININE: 1.12 mg/dL (ref 0.50–1.35)
Chloride: 100 mEq/L (ref 96–112)
Glucose, Bld: 197 mg/dL — ABNORMAL HIGH (ref 70–99)
POTASSIUM: 4.1 meq/L (ref 3.5–5.3)
Sodium: 136 mEq/L (ref 135–145)

## 2015-01-18 LAB — MICROALBUMIN / CREATININE URINE RATIO
CREATININE, URINE: 223.1 mg/dL
Microalb Creat Ratio: 30.5 mg/g — ABNORMAL HIGH (ref 0.0–30.0)
Microalb, Ur: 6.8 mg/dL — ABNORMAL HIGH (ref <2.0)

## 2015-01-18 LAB — HEPATIC FUNCTION PANEL
ALK PHOS: 150 U/L — AB (ref 39–117)
ALT: 30 U/L (ref 0–53)
AST: 30 U/L (ref 0–37)
Albumin: 4.3 g/dL (ref 3.5–5.2)
BILIRUBIN DIRECT: 0.1 mg/dL (ref 0.0–0.3)
Indirect Bilirubin: 0.4 mg/dL (ref 0.2–1.2)
Total Bilirubin: 0.5 mg/dL (ref 0.2–1.2)
Total Protein: 7.8 g/dL (ref 6.0–8.3)

## 2015-01-18 LAB — LIPID PANEL
Cholesterol: 164 mg/dL (ref 0–200)
HDL: 42 mg/dL (ref >39)
LDL Cholesterol: 86 mg/dL (ref 0–99)
Total CHOL/HDL Ratio: 3.9 Ratio
Triglycerides: 181 mg/dL — ABNORMAL HIGH (ref <150)
VLDL: 36 mg/dL (ref 0–40)

## 2015-01-18 LAB — POCT GLYCOSYLATED HEMOGLOBIN (HGB A1C): Hemoglobin A1C: 8.9

## 2015-01-18 LAB — C-PEPTIDE: C PEPTIDE: 3.44 ng/mL (ref 0.80–3.90)

## 2015-01-18 MED ORDER — SAXAGLIPTIN-METFORMIN ER 5-1000 MG PO TB24: 1.0000 | ORAL_TABLET | Freq: Every day | ORAL | Status: DC

## 2015-01-18 MED ORDER — INSULIN GLARGINE 100 UNIT/ML SOLOSTAR PEN: 15.0000 [IU] | PEN_INJECTOR | Freq: Every day | SUBCUTANEOUS | Status: DC

## 2015-01-18 MED ORDER — AMLODIPINE BESYLATE 10 MG PO TABS: 10.0000 mg | ORAL_TABLET | Freq: Every day | ORAL | Status: DC

## 2015-01-18 MED ORDER — LISINOPRIL 20 MG PO TABS: 40.0000 mg | ORAL_TABLET | Freq: Every day | ORAL | Status: DC

## 2015-01-18 MED ORDER — ALLOPURINOL 300 MG PO TABS: 300.0000 mg | ORAL_TABLET | Freq: Every day | ORAL | Status: DC

## 2015-01-18 NOTE — Progress Notes (Signed)
Subjective: Here for f/u on numerous concerns and this is typically how he shows up with a whole list of items to discuss, gets irritable if we don't address them.  Of note, he was contacted after +Hep C labs in 10/2014, advised to return that week, but never returned til now, says we never called him.  However, he wouldn't have known about the result without us calling him.   Thus, here today for hep C+f/u.  He notes in the last week having polyuria, polydipsia, glucose readings as high as 400.   Hasn't been on diabetes medication since numbers have been controlled prior.   Diet - no discretion, but not sure what to eat.  HTN - compliant with medication changes we made last visit  Hyperlipidemia - compliant with medication added last visit, Crestor  He has lots of questions about Hep C, Hep B, what he needs to do.  Worried about his kidney function given the sugar readings.  Still having ankle pains.    Has acne popping up on back, arms, chest  Has questions about last labs, last visit.  He has seen GI since last visit here.  Objective: BP 122/80 mmHg  Pulse 102  Temp(Src) 98.1 F (36.7 C) (Oral)  Resp 14  Wt 250 lb (113.399 kg)  Gen: wd, wn, nad, obese AA male, somewhat irritable affect, seems to be either upset or non trusting of me Heart:RRR, normal s1, s2, no murmurs Lungs: clear Abdomen: +bs, soft, nontender, no mass, no organomegaly Ext: no edema Pulses: 2+ HENT:unremarkable Oral:MMM, no lesions Neck: supple, nontender, no thyromegaly, no lymphadenopathy   Assessment: Encounter Diagnoses  Name Primary?  . Type II diabetes mellitus, uncontrolled Yes  . Elevated glucose   . Obesity   . Hepatitis C virus infection, unspecified chronicity   . Hyperlipidemia   . Need for prophylactic vaccination and inoculation against viral hepatitis   . Essential hypertension   . Polydipsia   . Polyuria    Plan: Type 2 diabetes presumably, polyuria, polydipsia - HgbA1C was  6.6% back in November 2015.  Today glucose was 202 on fingerstick fasting, 8.9% HgbA1C today.  UA reviewed.  Recently seems to have hit a threshold with glucoses readings in the 400s.   STAT labs today.  Begin Kombiglyze 5/1000mg  daily, Lantus 15u QHS, check glucose daily.  Nurse gave demo on glucometer and Lantus  Obesity - lifestyle changes advised  Hep C+ on labs in 10/2014.  He never returned for f/u.  Discussed diagnosis, possible complications, possible treatment, additional labs, and precautions.  additional labs today.  Hyperlipidemia - compliant with crestor.  Labs today since starting crestor.   considering liver disease, may need to change this though  Counseled on the Hepatitis A virus vaccine.  Vaccine information sheet given.  Hepatitis A vaccine given after consent obtained.  Patient was advised to return in 6 months for Hep A #2.  Counseled on the Hepatitis B virus vaccine.  Vaccine information sheet given.  Hepatitis B vaccine given after consent obtained.  Patient was advised to return in 2 months for Hep B #2, and in 6 months for Hep B #3.   Hypertension much improved after we modified medications last visit  F/u pending labs

## 2015-01-18 NOTE — Patient Instructions (Signed)
Diabetes Meal Planning Guide The diabetes meal planning guide is a tool to help you plan your meals and snacks. It is important for people with diabetes to manage their blood glucose (sugar) levels. Choosing the right foods and the right amounts throughout your day will help control your blood glucose. Eating right can even help you improve your blood pressure and reach or maintain a healthy weight.  CARBOHYDRATE COUNTING MADE EASY When you eat carbohydrates, they turn to sugar. This raises your blood glucose level. Counting carbohydrates can help you control this level so you feel better. When you plan your meals by counting carbohydrates, you can have more flexibility in what you eat and balance your medicine with your food intake.  Carbohydrate counting simply means adding up the total amount of carbohydrate grams in your meals and snacks. Try to eat about the same amount at each meal. Foods with carbohydrates are listed below.   Each portion below is 1 carbohydrate serving or 15 grams of carbohydrates.    1800 Calorie Diet for Diabetes Meal Planning The 1800 calorie diet is designed for eating up to 1800 calories each day. Following this diet and making healthy meal choices can help improve overall health. This diet controls blood sugar (glucose) levels and can also help lower blood pressure and cholesterol.  SERVING SIZES Measuring foods and serving sizes helps to make sure you are getting the right amount of food. The list below tells how big or small some common serving sizes are:  1 oz.........4 stacked dice.  3 oz.........Deck of cards.  1 tsp........Tip of little finger.  1 tbs........Thumb.  2 tbs........Golf ball.   cup.......Half of a fist.  1 cup........A fist.   GUIDELINES FOR CHOOSING FOODS The goal of this diet is to eat a variety of foods and limit calories to 1800 each day. This can be done by choosing foods that are low in calories and fat. The diet also suggests  eating small amounts of food frequently. Doing this helps control your blood glucose levels so they do not get too high or too low. Each meal or snack may include a protein food source to help you feel more satisfied and to stabilize your blood glucose. Try to eat about the same amount of food around the same time each day. This includes weekend days, travel days, and days off work. Space your meals about 4 to 5 hours apart and add a snack between them if you wish.  For example, a daily food plan could include breakfast, a morning snack, lunch, dinner, and an evening snack. Healthy meals and snacks include whole grains, vegetables, fruits, lean meats, poultry, fish, and dairy products. As you plan your meals, select a variety of foods. Choose from the bread and starch, vegetable, fruit, dairy, and meat/protein groups. Examples of foods from each group and their suggested serving sizes are listed below. Use measuring cups and spoons to become familiar with what a healthy portion looks like. Bread and Starch Each serving equals 15 grams of carbohydrates.  1 slice bread.   bagel.   cup cold cereal (unsweetened).   cup hot cereal or mashed potatoes.  1 small potato (size of a computer mouse).   cup cooked pasta or rice.   English muffin.  1 cup broth-based soup.  3 cups of popcorn.  4 to 6 whole-wheat crackers.   cup cooked beans, peas, or corn. Vegetable Each serving equals 5 grams of carbohydrates.   cup cooked vegetables.  1   cup raw vegetables.   cup tomato or vegetable juice. Fruit Each serving equals 15 grams of carbohydrates.  1 small apple or orange.  1 cup watermelon or strawberries.   cup applesauce (no sugar added).  2 tbs raisins.   banana.   cup canned fruit, packed in water, its own juice, or sweetened with a sugar substitute.   cup unsweetened fruit juice. Dairy Each serving equals 12 to 15 grams of carbohydrates.  1 cup fat-free milk.  6  oz artificially sweetened yogurt or plain yogurt.  1 cup low-fat buttermilk.  1 cup soy milk.  1 cup almond milk. Meat/Protein  1 large egg.  2 to 3 oz meat, poultry, or fish.   cup low-fat cottage cheese.  1 tbs peanut butter.  1 oz low-fat cheese.   cup tuna in water.   cup tofu. Fat  1 tsp oil.  1 tsp trans-fat-free margarine.  1 tsp butter.  1 tsp mayonnaise.  2 tbs avocado.  1 tbs salad dressing.  1 tbs cream cheese.  2 tbs sour cream.  SAMPLE 1800 CALORIE DIET PLAN Breakfast   cup unsweetened cereal (1 carb serving).  1 cup fat-free milk (1 carb serving).  1 slice whole-wheat toast (1 carb serving).   small banana (1 carb serving).  1 scrambled egg.  1 tsp trans-fat-free margarine. Lunch  Tuna sandwich.  2 slices whole-wheat bread (2 carb servings).   cup canned tuna in water, drained.  1 tbs reduced fat mayonnaise.  1 stalk celery, chopped.  2 slices tomato.  1 lettuce leaf.  1 cup carrot sticks.  24 to 30 seedless grapes (2 carb servings).  6 oz light yogurt (1 carb serving). Afternoon Snack  3 graham cracker squares (1 carb serving).  Fat-free milk, 1 cup (1 carb serving).  1 tbs peanut butter. Dinner  3 oz salmon, broiled with 1 tsp oil.  1 cup mashed potatoes (2 carb servings) with 1 tsp trans-fat-free margarine.  1 cup fresh or frozen green beans.  1 cup steamed asparagus.  1 cup fat-free milk (1 carb serving). Evening Snack  3 cups air-popped popcorn (1 carb serving).  2 tbs parmesan cheese sprinkled on top.  MEAL PLAN Use this worksheet to help you make a daily meal plan based on the 1800 calorie diet suggestions. If you are using this plan to help you control your blood glucose, you may interchange carbohydrate-containing foods (dairy, starches, and fruits). Select a variety of fresh foods of varying colors and flavors. The total amount of carbohydrate in your meals or snacks is more important  than making sure you include all of the food groups every time you eat. Choose from the following foods to build your day's meals:  8 Starches.  4 Vegetables.  3 Fruits.  2 Dairy.  6 to 7 oz Meat/Protein.  Up to 4 Fats.  Your dietician can use this worksheet to help you decide how many servings and which types of foods are right for you. BREAKFAST Food Group and Servings / Food Choice Starch ________________________________________________________ Dairy _________________________________________________________ Fruit _________________________________________________________ Meat/Protein __________________________________________________ Fat ___________________________________________________________ LUNCH Food Group and Servings / Food Choice Starch ________________________________________________________ Meat/Protein __________________________________________________ Vegetable _____________________________________________________ Fruit _________________________________________________________ Dairy _________________________________________________________ Fat ___________________________________________________________ AFTERNOON SNACK Food Group and Servings / Food Choice Starch ________________________________________________________ Meat/Protein __________________________________________________ Fruit __________________________________________________________ Dairy _________________________________________________________ DINNER Food Group and Servings / Food Choice Starch _________________________________________________________ Meat/Protein ___________________________________________________ Dairy __________________________________________________________ Vegetable ______________________________________________________ Fruit ___________________________________________________________ Fat ____________________________________________________________ EVENING SNACK Food  Group and Servings / Food Choice   Fruit __________________________________________________________ Meat/Protein ___________________________________________________ Dairy __________________________________________________________ Starch _________________________________________________________ DAILY TOTALS Starch ____________________________ Vegetable _________________________ Fruit _____________________________ Dairy _____________________________ Meat/Protein______________________ Fat _______________________________ Document Released: 06/29/2005 Document Revised: 02/29/2012 Document Reviewed: 10/23/2011 [employer] Patient Information 2013 [employer].  

## 2015-01-19 LAB — INSULIN, RANDOM: Insulin: 40.3 u[IU]/mL — ABNORMAL HIGH (ref 2.0–19.6)

## 2015-01-21 ENCOUNTER — Telehealth: Payer: Self-pay | Admitting: Medical

## 2015-01-21 LAB — HEPATITIS C RNA QUANTITATIVE: HCV Quantitative: NOT DETECTED IU/mL (ref <15)

## 2015-01-21 NOTE — Telephone Encounter (Signed)
Pt concerned with precautions on Saxagliptin-Metformin which state not to take this med if you have liver issues and since pt has liver issues with recent dx of Hep C. Also this med is too expensive for pt to get. Pt says his blood sugars has averaged 300 all weekend with numbers around 300, 259, 278 and in the 300's. Pt also wants to know his lab results. Said he received a call stating that his labs were good & all results were not in yet but no mention about which labs were good and which were not in yet. Pt wants more details on labs.

## 2015-01-21 NOTE — Telephone Encounter (Signed)
All labs aren't back yet

## 2015-01-22 LAB — HEPATITIS C GENOTYPE

## 2015-01-22 NOTE — Telephone Encounter (Signed)
Patient states that he is unsure if he wants to know the DM medication or the insulin considering he doesn't know the results of his Liver test yet. He said the pill form of the medication was going to cost him $ 50.oo a month he can't afford that. Please, advise if he needs to make an appointment

## 2015-01-22 NOTE — Telephone Encounter (Signed)
   Patient is aware that lab results are not ready.

## 2015-02-01 ENCOUNTER — Telehealth: Payer: Self-pay | Admitting: Family Medicine

## 2015-02-01 NOTE — Telephone Encounter (Signed)
Notes Recorded by Jac Canavanavid S Tysinger, PA-C on 01/24/2015 at 7:37 AM The lab results show that there is no hepatitis C virus detected which shows he cleared the virus. So he was hep C positive at one time but no longer has virus in his system. As a follow-up the next time we talk in person we will discuss potentially doing a liver ultrasound and a few other tests just to make sure there is nothing wrong with liver  Other findings on labs is early marker for kidney diseases elevated due to diabetes or blood pressure, a lab call the alkaline phosphate is elevated  Here are the recommendations: 1-since Kombiglyze XR was too expensive, make sure he used a coupon card. If not he needs to try to run this through insurance with the coupon card at the pharmacy. Or HE needs to call his insurance company to see which similar medication they would cover or ask they sent him a formulary so we'll know 2 - in the meantime I'll send Janumet as an alternative to Kombiglyze XR. So he needs to be either taking Kombiglyze XR daily or Janumet twice daily for diabetes 3-the Crestor is working to control his cholesterol and I do not believe it will be a problem with his liver along as we check the liver enzymes periodically like we do in general. If he is really worried about this I can change him to Livalo which is also for cholesterol but doesn't go to the liver. This may be more expensive however. See if he wants to change 4-if he is not taking vitamin D I would recommend he take over-the-counter vitamin D 2000 units a day. This is likely the reason his Alkalline Phosphatase lab is elevated 5-continue the rest of the medications as usual including Lantus 15 units at bedtime, the 2 blood pressure medications lisinopril and Norvasc, multivitamin, aspirin daily, allopurinol daily for gout  Overall I hope this is reassuring to him that he cleared to hepatitis C.  Let us plan a follow-up in 3 months regarding cholesterol,  diabetes, blood pressure, recheck labs, recheck liver test, sooner if needed

## 2015-02-01 NOTE — Telephone Encounter (Signed)
Patient is aware of Kristian CoveyShane Tysinger PA message in full detail and all of Kristian CoveyShane Tysinger PA recommendations in full. Patient understood everything but still had some issues in regards to his medication regimen. He has an appointment on Wednesday of next week which he will then discuss in further detail about his concerns in regards to his medications.

## 2015-02-06 ENCOUNTER — Other Ambulatory Visit: Payer: Self-pay | Admitting: Family Medicine

## 2015-02-06 ENCOUNTER — Telehealth: Payer: Self-pay | Admitting: Medical

## 2015-02-06 ENCOUNTER — Encounter: Payer: Self-pay | Admitting: Medical

## 2015-02-06 ENCOUNTER — Ambulatory Visit (INDEPENDENT_AMBULATORY_CARE_PROVIDER_SITE_OTHER): Payer: BLUE CROSS/BLUE SHIELD | Admitting: Medical

## 2015-02-06 ENCOUNTER — Other Ambulatory Visit: Payer: Self-pay

## 2015-02-06 VITALS — BP 122/90 | HR 86 | Temp 98.1°F | Resp 15 | Wt 224.0 lb

## 2015-02-06 DIAGNOSIS — I10 Essential (primary) hypertension: Secondary | ICD-10-CM

## 2015-02-06 DIAGNOSIS — E669 Obesity, unspecified: Secondary | ICD-10-CM

## 2015-02-06 DIAGNOSIS — R768 Other specified abnormal immunological findings in serum: Secondary | ICD-10-CM

## 2015-02-06 DIAGNOSIS — E1110 Type 2 diabetes mellitus with ketoacidosis without coma: Secondary | ICD-10-CM

## 2015-02-06 DIAGNOSIS — M1000 Idiopathic gout, unspecified site: Secondary | ICD-10-CM

## 2015-02-06 DIAGNOSIS — E1165 Type 2 diabetes mellitus with hyperglycemia: Secondary | ICD-10-CM

## 2015-02-06 DIAGNOSIS — IMO0002 Reserved for concepts with insufficient information to code with codable children: Secondary | ICD-10-CM

## 2015-02-06 DIAGNOSIS — R7689 Other specified abnormal immunological findings in serum: Secondary | ICD-10-CM

## 2015-02-06 DIAGNOSIS — E785 Hyperlipidemia, unspecified: Secondary | ICD-10-CM

## 2015-02-06 DIAGNOSIS — R894 Abnormal immunological findings in specimens from other organs, systems and tissues: Secondary | ICD-10-CM

## 2015-02-06 MED ORDER — METFORMIN HCL 1000 MG PO TABS: 1000.0000 mg | ORAL_TABLET | Freq: Two times a day (BID) | ORAL | Status: DC

## 2015-02-06 NOTE — Patient Instructions (Signed)
Please check your insurance company for co-pay's for the following tests:  Abdominal ultrasound to check his liver given his history of hepatitis C and obesity  I would like to check some additional lab tests including alpha-fetoprotein, ferritin level, vitamin D level, and uric acid  Please check your insurance company's formulary for the following diabetes medications in case we need to switch:  Kombiglyze XR   Janumet  Farxiga  Invokana  Tanzeum  Victoza  Check your insurance coverage for the following vaccinations:  Tdap  Pneumococcal vaccine 23  Currently we will keep the following medications the same: For diabetes  Begin generic metformin 1000 mg twice daily but start out half tablet twice daily for the first week in case of nausea or loose stool  Continue Lantus but increase to 20 units at nighttime  For hypertension/high blood pressure  Continue Norvasc and lisinopril daily  For gout continue allopurinol 300 mg once daily  For cholesterol  continue Crestor 10 mg daily at bedtime  Continue aspirin 81 mg daily at bedtime  We will refer you to a nutritionist  Exercise regularly eat a healthy diabetic diet  Return in 6 months for your second round of hepatitis A and B vaccines

## 2015-02-06 NOTE — Progress Notes (Signed)
Subjective: Here in discussion from last visit in January, to review labs, medications, concerns with medications.    He has a history of diabetes type 2 uncontrolled, obesity, hyperlipidemia, hypertension. He states that the Kombiglyze XR was too expensive, not sure about the Janumet we prescribed as an alternative.  Worried about commercials on tv.   Has been on metformin prior.  He is compliant with Norvasc and lisinopril.  Stopped crestor, worried about liver, on a fixed income.   He is taking Lantus 15 units daily at bedtime.  He is taking allopurinol 300 mg daily  He is taking aspirin 81 mg daily  He notes a week ago had a sharp chest pain, lasted for a second, but out of the blue.  No subsequent pains.     Has some occasional burning pains in his toes bilat.  After his last visit looking at medication side effects in reading about his medications he decided he did not want to take Crestor worried about his liver.  He also  Past Medical History  Diagnosis Date  . Hypertension   . Diabetes mellitus without complication   . Obesity   . Hyperlipidemia   . Gout   . Hepatitis C antibody test positive     prior infection, resolved   ROS as in subjective   Objective: BP 122/90 mmHg  Pulse 86  Temp(Src) 98.1 F (36.7 C) (Oral)  Resp 15  Wt 224 lb (101.606 kg)  Gen: wd, wn, nad otherwise not examined  Assessment: Encounter Diagnoses  Name Primary?  . Diabetes type 2, uncontrolled   . Hepatitis C antibody test positive Yes  . Hyperlipidemia   . Essential hypertension   . Obesity   . Idiopathic gout, unspecified chronicity, unspecified site    Plan: I reviewed his labs from January 29 showing basic metabolic panel with glucose of 197 otherwise normal, urinalysis showed 3+ glucose, some ketones (fasting), 1.030 SG, otherwise normal, blood count showed mildly elevated red blood cell count RDW mildly elevated, MCH and MCV a little low, hemoglobin A1c of 8.9%, hepatitis  C viral load not detected, HCV genotype canceled due to no virus detected, micro albumin was elevated, total cholesterol 164, LDL cholesterol 86, triglycerides 181, total cholesterol to HDL ratio 3.9, C-peptide was 3.44 within normal range, insulin was elevated 40.3, alkaline phosphatase was elevated at 150, otherwise liver panel normal.  Uric acid 8.1 in November 2015. In November his hepatitis B labs were normal, negative for HIV and syphilis.  At this point consider abdominal ultrasound, labs for alpha-fetoprotein and ferritin and vitamin D. Recheck uric acid.  He wants to check insurance copay first  Hypertension-continue Norvasc and lisinopril  Hyperlipidemia-continue Crestor  Diabetes type 2 uncontrolled-continue Lantus 15 units at bedtime, begin back on Metformin generic 1000mg  BID   Gout-continue allopurinol daily   Work on efforts to eat a healthy diet and lose weight. Referral to nutritionist.  Immunizations-advised pneumococcal vaccine, Tdap vaccine. He is up-to-date on influenza vaccine, we gave hep A and hep B vaccine at last visit.  We reviewed his labs, recommendations, medications, concerns. 45 minutes spent face-to-face in discussion and concerns  This note was dictated using [employer] voice recognition software.  Any spelling, word, or phrase errors above were unintentional.

## 2015-02-06 NOTE — Telephone Encounter (Signed)
Refer to nutritionist 

## 2015-02-06 NOTE — Telephone Encounter (Signed)
[facility]

## 2015-04-18 ENCOUNTER — Encounter: Payer: Self-pay | Admitting: Medical

## 2015-04-18 ENCOUNTER — Telehealth: Payer: Self-pay | Admitting: Medical

## 2015-04-18 ENCOUNTER — Ambulatory Visit (INDEPENDENT_AMBULATORY_CARE_PROVIDER_SITE_OTHER): Payer: BLUE CROSS/BLUE SHIELD | Admitting: Medical

## 2015-04-18 VITALS — BP 140/100 | HR 90 | Temp 97.9°F | Resp 20 | Wt 251.0 lb

## 2015-04-18 DIAGNOSIS — E785 Hyperlipidemia, unspecified: Secondary | ICD-10-CM

## 2015-04-18 DIAGNOSIS — Z23 Encounter for immunization: Secondary | ICD-10-CM

## 2015-04-18 DIAGNOSIS — Z9119 Patient's noncompliance with other medical treatment and regimen: Secondary | ICD-10-CM | POA: Diagnosis not present

## 2015-04-18 DIAGNOSIS — K219 Gastro-esophageal reflux disease without esophagitis: Secondary | ICD-10-CM

## 2015-04-18 DIAGNOSIS — M25571 Pain in right ankle and joints of right foot: Secondary | ICD-10-CM

## 2015-04-18 DIAGNOSIS — M1000 Idiopathic gout, unspecified site: Secondary | ICD-10-CM | POA: Diagnosis not present

## 2015-04-18 DIAGNOSIS — E1165 Type 2 diabetes mellitus with hyperglycemia: Secondary | ICD-10-CM | POA: Diagnosis not present

## 2015-04-18 DIAGNOSIS — I10 Essential (primary) hypertension: Secondary | ICD-10-CM | POA: Diagnosis not present

## 2015-04-18 DIAGNOSIS — Z8619 Personal history of other infectious and parasitic diseases: Secondary | ICD-10-CM | POA: Diagnosis not present

## 2015-04-18 DIAGNOSIS — J4521 Mild intermittent asthma with (acute) exacerbation: Secondary | ICD-10-CM

## 2015-04-18 DIAGNOSIS — R0602 Shortness of breath: Secondary | ICD-10-CM | POA: Diagnosis not present

## 2015-04-18 DIAGNOSIS — M25552 Pain in left hip: Secondary | ICD-10-CM

## 2015-04-18 DIAGNOSIS — Z91199 Patient's noncompliance with other medical treatment and regimen due to unspecified reason: Secondary | ICD-10-CM

## 2015-04-18 DIAGNOSIS — IMO0002 Reserved for concepts with insufficient information to code with codable children: Secondary | ICD-10-CM

## 2015-04-18 MED ORDER — LISINOPRIL 20 MG PO TABS: 40.0000 mg | ORAL_TABLET | Freq: Every day | ORAL | Status: DC

## 2015-04-18 MED ORDER — AMLODIPINE BESYLATE 10 MG PO TABS: 10.0000 mg | ORAL_TABLET | Freq: Every day | ORAL | Status: DC

## 2015-04-18 MED ORDER — METFORMIN HCL 1000 MG PO TABS: 1000.0000 mg | ORAL_TABLET | Freq: Two times a day (BID) | ORAL | Status: DC

## 2015-04-18 MED ORDER — CETIRIZINE HCL 10 MG PO TABS: 10.0000 mg | ORAL_TABLET | Freq: Every day | ORAL | Status: DC

## 2015-04-18 MED ORDER — ROSUVASTATIN CALCIUM 10 MG PO TABS: 10.0000 mg | ORAL_TABLET | Freq: Every day | ORAL | Status: DC

## 2015-04-18 MED ORDER — OMEPRAZOLE 40 MG PO CPDR
40.0000 mg | DELAYED_RELEASE_CAPSULE | Freq: Every day | ORAL | Status: DC
Start: 2015-04-18 — End: 2015-06-12

## 2015-04-18 MED ORDER — ALBUTEROL SULFATE 108 (90 BASE) MCG/ACT IN AEPB: 1.0000 | INHALATION_SPRAY | Freq: Four times a day (QID) | RESPIRATORY_TRACT | Status: DC | PRN

## 2015-04-18 MED ORDER — ALLOPURINOL 300 MG PO TABS: 300.0000 mg | ORAL_TABLET | Freq: Every day | ORAL | Status: DC

## 2015-04-18 MED ORDER — INSULIN GLARGINE 100 UNIT/ML SOLOSTAR PEN: 15.0000 [IU] | PEN_INJECTOR | Freq: Every day | SUBCUTANEOUS | Status: DC

## 2015-04-18 MED ORDER — BECLOMETHASONE DIPROPIONATE 80 MCG/ACT IN AERS: 1.0000 | INHALATION_SPRAY | Freq: Two times a day (BID) | RESPIRATORY_TRACT | Status: DC

## 2015-04-18 NOTE — Telephone Encounter (Signed)
Refer to Dr. Sherlean FootLucey for right ankle and left hip pain

## 2015-04-18 NOTE — Patient Instructions (Signed)
For asthma flare  Begin Cetirizine/Zyrtec at bedtime daily for the next month  Begin Omeprazole once daily in the morning for acid/gas/bloating  Begin Qvar inhaler, 1 puff twice daily for prevention.  Rinse mouth out with water after use  Use albuterol inhaler, 1-2 puffs every 4-6 hours as needed for wheezing, shortness of breath, cough spells  Restart the Crestor  Recheck here in 1 month

## 2015-04-18 NOTE — Progress Notes (Signed)
Subjective: Here for several concerns, and despite the fact that we have discussed prior that he has to narrow down concerns, he won't let me finish getting the history of 1 compliant before he goes into the next 2 issues.  He notes a history of asthma although this is the first time we've ever discussed this or he is not disclosed this prior.  For the last few weeks been having some heaviness on the chest, shortness of breath and the other night awoke sleep with shortness of breath. Denies chest pain, no swelling in legs, no recent weight gain. He had an inhaler prior but it is empty.  He notes some mild allergy issues, runny nose, congestion, sneezing. He doesn't recall any prior stress test.  Hypertension-not checking blood pressures regularly but is compliant with medication  Hyperlipidemia-he ran out of Crestor wasn't sure if he was supposed to continue this or not. Ran out about a month ago  He gets a little dizzy sometimes he be stands up too quick  Here for the next round of his hepatitis B vaccine  Still having right ankle and left hip pains for months, and we saw him for the right ankle a few months ago. Does not getting any better.   He notes belly pains, gas, acid reflux   Past Medical History  Diagnosis Date  . Hypertension   . Diabetes mellitus without complication   . Obesity   . Hyperlipidemia   . Gout   . Hepatitis C antibody test positive     prior infection, resolved   No past surgical history on file.   Objective: BP 140/100 mmHg  Pulse 90  Temp(Src) 97.9 F (36.6 C) (Oral)  Resp 20  Wt 251 lb (113.853 kg)  SpO2 95%   General appearance: alert, no distress, WD/WN, obese AA male seated in chair HEENT: normocephalic, sclerae anicteric, TMs pearly, nares patent, no discharge or erythema, pharynx normal Oral cavity: MMM, no lesions Neck: supple, no lymphadenopathy, no thyromegaly, no masses, no JVD Heart: RRR, normal S1, S2, no murmurs Lungs: few scattered  wheezes, no rhonchi, or rales Abdomen: +bs, soft, mild epigastric tenderness, otherwise non tender, non distended, no masses, no hepatomegaly, no splenomegaly Pulses: 2+ symmetric, upper and lower extremities, normal cap refill Ext: no edema MSK: left hip nontender with ROM, rest of MSK not examined at this visit    Adult ECG Report  Indication: SOB  Rate: 84 bpm  Rhythm: normal sinus rhythm and premature ventricular contractions (PVC)  QRS Axis: 55 degrees  PR Interval:  QRS Duration: 90ms  QTc:  Conduction Disturbances: none  Other Abnormalities: none  Patient's cardiac risk factors are: advanced age (older than 67 for men, 66 for women), diabetes mellitus, dyslipidemia, hypertension, male gender, obesity (BMI >= 30 kg/m2) and sedentary lifestyle.  EKG comparison: none  Narrative Interpretation: occasional PVCs      Assessment: Encounter Diagnoses  Name Primary?  Marland Kitchen Asthma with acute exacerbation, mild intermittent Yes  . SOB (shortness of breath)   . Essential hypertension   . Right ankle pain   . Left hip pain   . Hyperlipidemia   . Diabetes type 2, uncontrolled   . Noncompliance   . Need for prophylactic vaccination and inoculation against viral hepatitis   . History of hepatitis B   . Gastroesophageal reflux disease without esophagitis   . Idiopathic gout, unspecified chronicity, unspecified site    Plan: Unfortunately with all of his visits this been difficult  to work with him as he always seems to demand to discuss multiple issues despite limited allotted time. We usually in the handling many issues and having to defer some.  Currently I feel like his shortness of breath is asthma related. EKG is unchanged, and after doing a round of nebulized albuterol his lungs were actually clear and he felt much better.  Begin trial of Qvar twice daily, discussed proper use, albuterol as needed, prescribed both. Begin antihistamine daily at bedtime. If not seeing  significant improvement in the next few days then recheck, consider chest x-ray at that time as well  Begin omeprazole for GERD, avoid GERD triggers foods  Continue current medicines for blood pressure, diabetes, and hyperlipidemia, and restart Crestor, recheck on chronic medications in one month including fasting labs  Counseled on the Hepatitis B virus vaccine.  Vaccine information sheet given.  Hepatitis B vaccine given after consent obtained.  Patient was advised to return in August for the last combo Hep A/hep B vaccine  Referral to orthopedist for right ankle and left hip pain

## 2015-04-19 NOTE — Telephone Encounter (Signed)
Patient is aware of his appointment to see Dr. Sherlean FootLucey. Dr. Sherlean FootLucey [address] [place], [place]  [phone] Apr 24, 2015 @ 2 pm

## 2015-04-20 ENCOUNTER — Telehealth: Payer: Self-pay | Admitting: Medical

## 2015-04-23 NOTE — Telephone Encounter (Signed)
P.A. Qvar denied, pt needs trial of 2 alternatives, Asmanex and Flovent, do you want to switch?

## 2015-04-24 ENCOUNTER — Other Ambulatory Visit: Payer: Self-pay | Admitting: Medical

## 2015-04-24 MED ORDER — MOMETASONE FUROATE 220 MCG/INH IN AEPB: 2.0000 | INHALATION_SPRAY | Freq: Two times a day (BID) | RESPIRATORY_TRACT | Status: DC

## 2015-04-24 NOTE — Telephone Encounter (Signed)
Switch to Asmanex, meds sent

## 2015-04-29 NOTE — Telephone Encounter (Signed)
Pt informed

## 2015-05-22 DIAGNOSIS — R768 Other specified abnormal immunological findings in serum: Secondary | ICD-10-CM

## 2015-05-22 HISTORY — DX: Other specified abnormal immunological findings in serum: R76.8

## 2015-06-10 ENCOUNTER — Encounter: Payer: Self-pay | Admitting: Medical

## 2015-06-10 ENCOUNTER — Telehealth: Payer: Self-pay | Admitting: Medical

## 2015-06-10 ENCOUNTER — Ambulatory Visit: Payer: BLUE CROSS/BLUE SHIELD | Admitting: Medical

## 2015-06-10 ENCOUNTER — Ambulatory Visit (INDEPENDENT_AMBULATORY_CARE_PROVIDER_SITE_OTHER): Payer: BLUE CROSS/BLUE SHIELD | Admitting: Medical

## 2015-06-10 VITALS — BP 132/90 | HR 88 | Resp 14 | Wt 251.0 lb

## 2015-06-10 DIAGNOSIS — M4692 Unspecified inflammatory spondylopathy, cervical region: Secondary | ICD-10-CM | POA: Diagnosis not present

## 2015-06-10 DIAGNOSIS — I700 Atherosclerosis of aorta: Secondary | ICD-10-CM

## 2015-06-10 DIAGNOSIS — M25571 Pain in right ankle and joints of right foot: Secondary | ICD-10-CM | POA: Diagnosis not present

## 2015-06-10 DIAGNOSIS — M1009 Idiopathic gout, multiple sites: Secondary | ICD-10-CM | POA: Diagnosis not present

## 2015-06-10 DIAGNOSIS — I739 Peripheral vascular disease, unspecified: Secondary | ICD-10-CM | POA: Diagnosis not present

## 2015-06-10 DIAGNOSIS — IMO0002 Reserved for concepts with insufficient information to code with codable children: Secondary | ICD-10-CM

## 2015-06-10 DIAGNOSIS — E1165 Type 2 diabetes mellitus with hyperglycemia: Secondary | ICD-10-CM

## 2015-06-10 DIAGNOSIS — M542 Cervicalgia: Secondary | ICD-10-CM

## 2015-06-10 DIAGNOSIS — E669 Obesity, unspecified: Secondary | ICD-10-CM | POA: Diagnosis not present

## 2015-06-10 DIAGNOSIS — I10 Essential (primary) hypertension: Secondary | ICD-10-CM

## 2015-06-10 DIAGNOSIS — Z6841 Body Mass Index (BMI) 40.0 and over, adult: Secondary | ICD-10-CM

## 2015-06-10 DIAGNOSIS — M47812 Spondylosis without myelopathy or radiculopathy, cervical region: Secondary | ICD-10-CM

## 2015-06-10 DIAGNOSIS — E785 Hyperlipidemia, unspecified: Secondary | ICD-10-CM | POA: Diagnosis not present

## 2015-06-10 LAB — COMPREHENSIVE METABOLIC PANEL
ALBUMIN: 4.4 g/dL (ref 3.5–5.2)
ALK PHOS: 100 U/L (ref 39–117)
ALT: 22 U/L (ref 0–53)
AST: 19 U/L (ref 0–37)
BUN: 15 mg/dL (ref 6–23)
CALCIUM: 9.7 mg/dL (ref 8.4–10.5)
CHLORIDE: 103 meq/L (ref 96–112)
CO2: 23 mEq/L (ref 19–32)
Creat: 1.16 mg/dL (ref 0.50–1.35)
GLUCOSE: 97 mg/dL (ref 70–99)
POTASSIUM: 4.7 meq/L (ref 3.5–5.3)
Sodium: 143 mEq/L (ref 135–145)
TOTAL PROTEIN: 7.7 g/dL (ref 6.0–8.3)
Total Bilirubin: 0.3 mg/dL (ref 0.2–1.2)

## 2015-06-10 LAB — CBC
HEMATOCRIT: 44.6 % (ref 39.0–52.0)
Hemoglobin: 14.3 g/dL (ref 13.0–17.0)
MCH: 24.4 pg — ABNORMAL LOW (ref 26.0–34.0)
MCHC: 32.1 g/dL (ref 30.0–36.0)
MCV: 76.2 fL — ABNORMAL LOW (ref 78.0–100.0)
MPV: 10.3 fL (ref 8.6–12.4)
Platelets: 261 10*3/uL (ref 150–400)
RBC: 5.85 MIL/uL — ABNORMAL HIGH (ref 4.22–5.81)
RDW: 17 % — ABNORMAL HIGH (ref 11.5–15.5)
WBC: 9.1 10*3/uL (ref 4.0–10.5)

## 2015-06-10 LAB — LIPID PANEL
CHOL/HDL RATIO: 3.5 ratio
Cholesterol: 137 mg/dL (ref 0–200)
HDL: 39 mg/dL — AB (ref >=40)
LDL CALC: 76 mg/dL (ref 0–99)
TRIGLYCERIDES: 111 mg/dL (ref <150)
VLDL: 22 mg/dL (ref 0–40)

## 2015-06-10 LAB — HEMOGLOBIN A1C
HEMOGLOBIN A1C: 6.3 % — AB (ref <5.7)
MEAN PLASMA GLUCOSE: 134 mg/dL — AB (ref <117)

## 2015-06-10 LAB — URIC ACID: Uric Acid, Serum: 4 mg/dL (ref 4.0–7.8)

## 2015-06-10 NOTE — Progress Notes (Signed)
Subjective: Here for med check and multiple issues as usual.  Diabetes type 2 - compliant with Metformin  BID, taking 22 units Lantus daily.  Checks glucose most days per week.  On days he checks often is in the 110s.   Declines nutritionist at this time.   Wants to know if he really needs to be on this medication.  Hyperlipidemia - compliant with Crestor  daily without side effects.  HTN - compliant with Lisinopril  daily, Norvasc  daily.  Doesn't check BPs regularly.  Gout - compliant with Allopurinol  daily.  Having problems with ankle.  Sprained ankle 09/2014, and hans't gotten better.  Saw orthopedist, visit was very brief, lady came in and referred him to physical therapy.  He was very unhappy with the visit.   He was frustrated with the visit didn't go to physical therapy.   Can walk 50 ft and then get pain.  He does note hx/o stent in right leg by vascular surgeon in remote past.   Has rash of right lower leg for years, intermittent.  Thinks its related to blood flow.  He notes hx/o toe injury that he ended up being hospital for.  Had surgery due to circulation problems.   Had similar pains from blood flow a year later.  Stent was put in the vessel. No prior cardiology visit, no prior stress test.    Needs refill on GERD medication which was working fine.  No other c/o  Objective: BP 132/90 mmHg  Pulse 88  Resp 14  Wt 251 lb (113.853 kg)  Wt Readings from Last 3 Encounters:  06/10/15 251 lb (113.853 kg)  04/18/15 251 lb (113.853 kg)  02/06/15 224 lb (101.606 kg)   BP Readings from Last 3 Encounters:  06/10/15 132/90  04/18/15 140/100  02/06/15 122/90   General appearance: alert, no distress, WD/WN, obese  AA male HEENT: normocephalic, sclerae anicteric, TMs pearly, nares patent, no discharge or erythema, pharynx normal Oral cavity: MMM, no lesions Neck: supple, tender left posterior neck, mildly decreased ROM in general due to pain, no  lymphadenopathy, no thyromegaly, no masses, no bruits Heart: RRR, normal S1, S2, no murmurs Lungs: CTA bilaterally, no wheezes, rhonchi, or rales Abdomen: +bs, soft, non tender, non distended, no masses, no hepatomegaly, no splenomegaly Pulses: 1+ pedal pulses right leg , 2+ left leg, UE pulse normal, normal cap refill Ext: no edema MSK: trenderness right medial ankle ligaments, otherwise nontender, no deformity Skin: rough brown raised bumpy skin right anterior lower leg suggestive of stasis dermatitis Neuro: monofilament exam normal, normal sensation and strength of bilat UE and LE   Assessment: Encounter Diagnoses  Name Primary?  . Diabetes type 2, uncontrolled Yes  . Acute idiopathic gout of multiple sites   . Essential hypertension   . Hyperlipidemia   . Obesity   . Atherosclerosis of aorta   . Cervical arthritis   . Claudication   . Neck pain   . Right ankle pain     Plan: DM type 2 -  compliment with Metformin  BID, Lantus 22 u QHS.  Not checking feet or glucose every day.   discussed diet, exercise, compliant, monitoring glucose, daily foot checks.  Gout - labs today, c/t Allopurinol  daily  HTN - not at goal.  C/t Norvasc  daily, c/t Lisinopril  daily.   Pending labs, consider other modification  Obesity -  Needs to lose weight  Hyperlipidemia, atherosclerosis - c/t Crestor  daily,labs today  Cervical  arthritis - send for C spine xray, refer to PT  Leg pain, claudication - given his overall cardiac risk, refer to cardiovascular specialist.  Needs stress tests, recheck on right leg claudication  Right ankle pain - sorry to hear about the bad experience at ortho.  Advised he wear the boot given by ortho, and refer to PT  Spent >45 minutes face to face in eval and discussion and plan.   F/u pending labs

## 2015-06-10 NOTE — Telephone Encounter (Signed)
Refer to physical therapy for neck and arm pain and ankle pain.   Neck and arm pain ongoing for months.  Needs help with this and general stretching program.   Make note for physical therapy -  right ankle pain ongoing since sprain injury 09/2014.

## 2015-06-10 NOTE — Telephone Encounter (Signed)
I fax over the patients information to [facility] and there office will contact the patient for his appointment for PT.  [facility] [phone]

## 2015-06-11 ENCOUNTER — Telehealth: Payer: Self-pay | Admitting: Medical

## 2015-06-11 ENCOUNTER — Other Ambulatory Visit: Payer: Self-pay | Admitting: Family Medicine

## 2015-06-11 DIAGNOSIS — I739 Peripheral vascular disease, unspecified: Secondary | ICD-10-CM

## 2015-06-11 LAB — MICROALBUMIN / CREATININE URINE RATIO
Creatinine, Urine: 209.8 mg/dL
MICROALB UR: 0.9 mg/dL (ref <2.0)
MICROALB/CREAT RATIO: 4.3 mg/g (ref 0.0–30.0)

## 2015-06-11 NOTE — Telephone Encounter (Signed)
I thought I sent note already but don't see it.  So please refer to [facility] for leg claudication, screening for heart disease, multiple risk factors

## 2015-06-11 NOTE — Telephone Encounter (Signed)
Vincenza Hews I scheduled his appointment at [facility] [address] ([facility]) to see Dr. Rennis Golden On 07/31/2015 @ 300 pm. She said this was the first available and new patients are not allowed to see PA at first visit. Is this okay? If not give me another place to check.    [address] [place], [place] [address] [phone]

## 2015-06-11 NOTE — Telephone Encounter (Signed)
That will be fine.  Its not super urgent

## 2015-06-12 ENCOUNTER — Other Ambulatory Visit: Payer: Self-pay | Admitting: Medical

## 2015-06-12 MED ORDER — METFORMIN HCL 1000 MG PO TABS: 1000.0000 mg | ORAL_TABLET | Freq: Two times a day (BID) | ORAL | Status: DC

## 2015-06-12 MED ORDER — MOMETASONE FUROATE 220 MCG/INH IN AEPB: 2.0000 | INHALATION_SPRAY | Freq: Two times a day (BID) | RESPIRATORY_TRACT | Status: DC

## 2015-06-12 MED ORDER — ASPIRIN 81 MG PO CHEW: 81.0000 mg | CHEWABLE_TABLET | Freq: Every day | ORAL | Status: DC

## 2015-06-12 MED ORDER — ROSUVASTATIN CALCIUM 10 MG PO TABS: 10.0000 mg | ORAL_TABLET | Freq: Every day | ORAL | Status: DC

## 2015-06-12 MED ORDER — OMEPRAZOLE 40 MG PO CPDR: 40.0000 mg | DELAYED_RELEASE_CAPSULE | Freq: Every day | ORAL | Status: DC

## 2015-06-12 MED ORDER — LISINOPRIL-HYDROCHLOROTHIAZIDE 20-12.5 MG PO TABS: 1.0000 | ORAL_TABLET | Freq: Every day | ORAL | Status: DC

## 2015-06-12 MED ORDER — ALLOPURINOL 300 MG PO TABS: 300.0000 mg | ORAL_TABLET | Freq: Every day | ORAL | Status: DC

## 2015-06-12 MED ORDER — AMLODIPINE BESYLATE 10 MG PO TABS: 10.0000 mg | ORAL_TABLET | Freq: Every day | ORAL | Status: DC

## 2015-06-12 MED ORDER — CETIRIZINE HCL 10 MG PO TABS: 10.0000 mg | ORAL_TABLET | Freq: Every day | ORAL | Status: DC

## 2015-06-12 MED ORDER — INSULIN GLARGINE 100 UNIT/ML SOLOSTAR PEN: 23.0000 [IU] | PEN_INJECTOR | Freq: Every day | SUBCUTANEOUS | Status: DC

## 2015-06-12 NOTE — Telephone Encounter (Signed)
Charles [name], Please try and call this patient. See msg below

## 2015-06-12 NOTE — Telephone Encounter (Signed)
LMOM TO CB. CLS 

## 2015-06-13 ENCOUNTER — Other Ambulatory Visit: Payer: Self-pay | Admitting: Medical

## 2015-06-13 MED ORDER — INSULIN GLARGINE 300 UNIT/ML ~~LOC~~ SOPN
23.0000 [IU] | PEN_INJECTOR | Freq: Every day | SUBCUTANEOUS | Status: DC
Start: 2015-06-13 — End: 2015-12-02

## 2015-06-17 ENCOUNTER — Ambulatory Visit
Admission: RE | Admit: 2015-06-17 | Discharge: 2015-06-17 | Disposition: A | Discharge status: Home / Self Care | Payer: BLUE CROSS/BLUE SHIELD | Source: Ambulatory Visit | Attending: Medical | Admitting: Medical

## 2015-06-17 DIAGNOSIS — M47812 Spondylosis without myelopathy or radiculopathy, cervical region: Secondary | ICD-10-CM

## 2015-06-17 DIAGNOSIS — M542 Cervicalgia: Secondary | ICD-10-CM

## 2015-07-22 DIAGNOSIS — I739 Peripheral vascular disease, unspecified: Secondary | ICD-10-CM

## 2015-07-22 DIAGNOSIS — R9389 Abnormal findings on diagnostic imaging of other specified body structures: Secondary | ICD-10-CM

## 2015-07-22 HISTORY — DX: Abnormal findings on diagnostic imaging of other specified body structures: R93.89

## 2015-07-22 HISTORY — DX: Peripheral vascular disease, unspecified: I73.9

## 2015-07-24 ENCOUNTER — Telehealth: Payer: Self-pay | Admitting: Medical

## 2015-07-24 ENCOUNTER — Other Ambulatory Visit: Payer: Self-pay | Admitting: Medical

## 2015-07-24 MED ORDER — AZILSARTAN-CHLORTHALIDONE 40-12.5 MG PO TABS: 1.0000 | ORAL_TABLET | Freq: Every day | ORAL | Status: DC

## 2015-07-24 NOTE — Telephone Encounter (Signed)
Patient is aware of Kristian Covey PA message and I left samples and coupon card up front for the patient to pick up.

## 2015-07-24 NOTE — Telephone Encounter (Signed)
I sent Edarbychlor to replace Lisinopril.   Give coupon card, samples, and if too expensive AFTER activating coupon then let me know

## 2015-07-24 NOTE — Telephone Encounter (Signed)
Pt called and stated that he is almost out of lisinopril hctz. He states at his office visit he was told that when he finished that medication you wanted to switch him to a different bp med. He now has two days left. Please call pt and let him know what he needs to be switched to and send into pt's pharmacy. Pt uses [facility] on [address] and can be reached at [phone].

## 2015-07-31 ENCOUNTER — Encounter: Payer: Self-pay | Admitting: Internal Medicine

## 2015-07-31 ENCOUNTER — Ambulatory Visit (INDEPENDENT_AMBULATORY_CARE_PROVIDER_SITE_OTHER): Payer: BLUE CROSS/BLUE SHIELD | Admitting: Internal Medicine

## 2015-07-31 VITALS — BP 170/98 | HR 112 | Ht 66.0 in | Wt 250.5 lb

## 2015-07-31 DIAGNOSIS — J45909 Unspecified asthma, uncomplicated: Secondary | ICD-10-CM

## 2015-07-31 DIAGNOSIS — I700 Atherosclerosis of aorta: Secondary | ICD-10-CM

## 2015-07-31 DIAGNOSIS — I739 Peripheral vascular disease, unspecified: Secondary | ICD-10-CM

## 2015-07-31 DIAGNOSIS — E669 Obesity, unspecified: Secondary | ICD-10-CM

## 2015-07-31 DIAGNOSIS — IMO0002 Reserved for concepts with insufficient information to code with codable children: Secondary | ICD-10-CM

## 2015-07-31 DIAGNOSIS — E785 Hyperlipidemia, unspecified: Secondary | ICD-10-CM | POA: Diagnosis not present

## 2015-07-31 DIAGNOSIS — I10 Essential (primary) hypertension: Secondary | ICD-10-CM | POA: Diagnosis not present

## 2015-07-31 DIAGNOSIS — R000 Tachycardia, unspecified: Secondary | ICD-10-CM

## 2015-07-31 DIAGNOSIS — R0609 Other forms of dyspnea: Secondary | ICD-10-CM

## 2015-07-31 DIAGNOSIS — R0602 Shortness of breath: Secondary | ICD-10-CM

## 2015-07-31 DIAGNOSIS — E1165 Type 2 diabetes mellitus with hyperglycemia: Secondary | ICD-10-CM

## 2015-07-31 NOTE — Progress Notes (Signed)
OFFICE NOTE  Chief Complaint:  Shortness of breath and right ankle pain  Primary Care Physician: Ernst Breach, PA-C  HPI:  Charles [name] is a pleasant 64 year old male who is originally from [place]. He is here for evaluation of progressive shortness of breath. He does have a history of asthma but recently is been having some cough and shortness of breath. He also has a history of diabetes on insulin, dyslipidemia, hypertension, and PAD. He tells me in 1996 he had a painful toe and was found to have rest pain in the office and was admitted for peripheral angiogram. He underwent a balloon angioplasty and ultimately required a stent per his report. We do not have those records available and we'll try to request them. He has not had follow-up since that time including no peripheral Dopplers. He's never had carotid Dopplers. He says in the past he's had a stress test but it's been many years ago. He denies any chest pain but is progressively more short of breath. He's also morbidly obese.  PMHx:  Past Medical History  Diagnosis Date  . Hypertension   . Diabetes mellitus without complication   . Obesity   . Hyperlipidemia   . Gout   . Hepatitis C antibody test positive 05/2015    prior infection, resolved    Past Surgical History  Procedure Laterality Date  . Balloon angioplasty, artery N/A also placed stent    FAMHx:  Family History  Problem Relation Age of Onset  . Asthma Mother   . Heart attack Mother   . Hypertension Mother   . Hyperlipidemia Mother   . Hypertension Father   . Diabetes Father   . HIV Sister   . Liver disease Brother   . Lung cancer Brother   . Hypertension Brother     SOCHx:   reports that he quit smoking about 20 years ago. His smoking use included Cigarettes. He has a 30 pack-year smoking history. He does not have any smokeless tobacco history on file. He reports that he does not drink alcohol or use illicit  drugs.  ALLERGIES:  Allergies  Allergen Reactions  . Penicillins Other (See Comments)    Per pt blacked out after injection as a child.     ROS: A comprehensive review of systems was negative except for: Respiratory: positive for asthma, cough and dyspnea on exertion Cardiovascular: positive for claudication  HOME MEDS: Current Outpatient Prescriptions  Medication Sig Dispense Refill  . Albuterol Sulfate (PROAIR RESPICLICK) 108 (90 BASE) MCG/ACT AEPB Inhale 1 puff into the lungs every 6 (six) hours as needed. 1 each 1  . allopurinol (ZYLOPRIM) 300 MG tablet Take 1 tablet (300 mg total) by mouth daily. 90 tablet 3  . amLODipine (NORVASC) 10 MG tablet Take 1 tablet (10 mg total) by mouth daily. 90 tablet 3  . aspirin 81 MG chewable tablet Chew 1 tablet (81 mg total) by mouth daily. 90 tablet 3  . Azilsartan-Chlorthalidone (EDARBYCLOR) 40-12.5 MG TABS Take 1 tablet by mouth daily. 30 tablet 2  . cetirizine (ZYRTEC) 10 MG tablet Take 1 tablet (10 mg total) by mouth at bedtime. 90 tablet 3  . Insulin Glargine (LANTUS SOLOSTAR) 100 UNIT/ML Solostar Pen Inject 23 Units into the skin daily at 10 pm. 5 pen 3  . Insulin Glargine (TOUJEO SOLOSTAR) 300 UNIT/ML SOPN Inject 23 Units into the skin at bedtime. 1.5 mL 3  . metFORMIN (GLUCOPHAGE) 1000 MG tablet Take 1 tablet (1,000  mg total) by mouth 2 (two) times daily with a meal. 180 tablet 3  . mometasone (ASMANEX 60 METERED DOSES) 220 MCG/INH inhaler Inhale 2 puffs into the lungs 2 (two) times daily. 1 Inhaler 2  . Multiple Vitamin (MULTIVITAMIN WITH MINERALS) TABS tablet Take 1 tablet by mouth daily.    . nitroGLYCERIN (NITROSTAT) 0.3 MG SL tablet Place 0.3 mg under the tongue every 5 (five) minutes as needed for chest pain.     Marland Kitchen omeprazole (PRILOSEC) 40 MG capsule Take 1 capsule (40 mg total) by mouth daily. 90 capsule 1  . ONETOUCH DELICA LANCETS 33G MISC   0  . ONETOUCH VERIO test strip   0  . rosuvastatin (CRESTOR) 10 MG tablet Take 1 tablet  (10 mg total) by mouth daily. 90 tablet 3   No current facility-administered medications for this visit.    LABS/IMAGING: No results found for this or any previous visit (from the past 48 hour(s)). No results found.  WEIGHTS: Wt Readings from Last 3 Encounters:  07/31/15 250 lb 8 oz (113.626 kg)  06/10/15 251 lb (113.853 kg)  04/18/15 251 lb (113.853 kg)    VITALS: BP 170/98 mmHg  Pulse 112  Ht  (1.676 m)  Wt 250 lb 8 oz (113.626 kg)  BMI 40.45 kg/m2  EXAM: General appearance: alert, no distress and morbidly obese Neck: no carotid bruit and no JVD Lungs: diminished breath sounds bibasilar and wheezes bilaterally Heart: regular rate and rhythm, S1, S2 normal, no murmur, click, rub or gallop Abdomen: soft, non-tender; bowel sounds normal; no masses,  no organomegaly and Morbidly obese Extremities: extremities normal, atraumatic, no cyanosis or edema Pulses: 1+ pulses Skin: Red papular rash over the right shin Neurologic: Grossly normal Psych: Pleasant  EKG: Sinus tachycardia at 112, possible left atrial enlargement  ASSESSMENT: 1. Progressive dyspnea on exertion 2. Tachycardia 3. PAD with prior stent to the right leg in 1996 4. Asthma 5. Type 2 diabetes 6. Hypertension 7. Dyslipidemia  PLAN: 1.   Mr. Bacallao has numerous cardiovascular risk factors including known PAD. He has not had reassessment of his peripheral arterial disease since the 1990s. He is overdue for a repeat ultrasound and I'll go ahead and order lower chimney arterial Dopplers. Is not clear whether he is describing claudication or right ankle pain, which he insists is due to a sprain that he suffered about 6 months ago. It would be unusual for him to still have pain related to that. In addition will obtain carotid Dopplers as is likely that he has some carotid artery disease as well. From a cardiac standpoint, he should have a stress test and will arrange for a 2 day Lexiscan nuclear stress test. I'm  concerned about heart failure and/or significant coronary artery disease with his resting tachycardia. He did not use albuterol today, therefore I do not think that's contributing to his tachycardia.  Thanks for the kind referral. I'll be in contact with the results of his studies.  Chrystie Nose, MD, [facility] Attending Cardiologist [facility]  Chrystie Nose 07/31/2015, 5:10 PM

## 2015-07-31 NOTE — Patient Instructions (Signed)
Your physician recommends that you schedule a follow-up appointment After Test  Your physician has requested that you have a lower extremity arterial duplex. This test is an ultrasound of the arteries in the legs. It looks at arterial blood flow in the legs. Allow one hour for Lower Arterial scans. There are no restrictions or special instructions  Your physician has requested that you have a carotid duplex. This test is an ultrasound of the carotid arteries in your neck. It looks at blood flow through these arteries that supply the brain with blood. Allow one hour for this exam. There are no restrictions or special instructions.  Your physician has requested that you have a lexiscan myoview. For further information please visit [email or web address] Please follow instruction sheet, as given.

## 2015-08-02 ENCOUNTER — Telehealth: Payer: Self-pay | Admitting: Medical

## 2015-08-07 ENCOUNTER — Other Ambulatory Visit: Payer: Self-pay | Admitting: Medical

## 2015-08-07 MED ORDER — LOSARTAN POTASSIUM-HCTZ 100-12.5 MG PO TABS: 1.0000 | ORAL_TABLET | Freq: Every day | ORAL | Status: DC

## 2015-08-07 NOTE — Telephone Encounter (Signed)
P.A. Tressia Danas denied, pt needs trial of covered alternative Losartan-HCTZ, Valsartan-HCTZ, Telmisartan-HCTZ Do you want to switch?

## 2015-08-07 NOTE — Telephone Encounter (Signed)
Losartan HCT sent.  Lets change to this

## 2015-08-09 ENCOUNTER — Inpatient Hospital Stay (HOSPITAL_COMMUNITY): Admission: RE | Admit: 2015-08-09 | Payer: BLUE CROSS/BLUE SHIELD | Source: Ambulatory Visit

## 2015-08-12 ENCOUNTER — Other Ambulatory Visit: Payer: Self-pay | Admitting: Internal Medicine

## 2015-08-12 DIAGNOSIS — I10 Essential (primary) hypertension: Secondary | ICD-10-CM

## 2015-08-12 DIAGNOSIS — I6523 Occlusion and stenosis of bilateral carotid arteries: Secondary | ICD-10-CM

## 2015-08-12 DIAGNOSIS — I739 Peripheral vascular disease, unspecified: Secondary | ICD-10-CM

## 2015-08-12 NOTE — Telephone Encounter (Signed)
Pt informed

## 2015-08-15 ENCOUNTER — Ambulatory Visit (HOSPITAL_COMMUNITY)
Admission: RE | Admit: 2015-08-15 | Discharge: 2015-08-15 | Disposition: A | Discharge status: Home / Self Care | Payer: BLUE CROSS/BLUE SHIELD | Source: Ambulatory Visit | Attending: Internal Medicine | Admitting: Internal Medicine

## 2015-08-15 ENCOUNTER — Ambulatory Visit (HOSPITAL_BASED_OUTPATIENT_CLINIC_OR_DEPARTMENT_OTHER)
Admission: RE | Admit: 2015-08-15 | Discharge: 2015-08-15 | Disposition: A | Discharge status: Home / Self Care | Payer: BLUE CROSS/BLUE SHIELD | Source: Ambulatory Visit | Attending: Internal Medicine | Admitting: Internal Medicine

## 2015-08-15 DIAGNOSIS — Z95820 Peripheral vascular angioplasty status with implants and grafts: Secondary | ICD-10-CM | POA: Insufficient documentation

## 2015-08-15 DIAGNOSIS — E785 Hyperlipidemia, unspecified: Secondary | ICD-10-CM | POA: Insufficient documentation

## 2015-08-15 DIAGNOSIS — E119 Type 2 diabetes mellitus without complications: Secondary | ICD-10-CM | POA: Insufficient documentation

## 2015-08-15 DIAGNOSIS — I10 Essential (primary) hypertension: Secondary | ICD-10-CM | POA: Insufficient documentation

## 2015-08-15 DIAGNOSIS — I739 Peripheral vascular disease, unspecified: Secondary | ICD-10-CM | POA: Diagnosis not present

## 2015-08-15 DIAGNOSIS — Z87891 Personal history of nicotine dependence: Secondary | ICD-10-CM | POA: Insufficient documentation

## 2015-08-15 DIAGNOSIS — I6523 Occlusion and stenosis of bilateral carotid arteries: Secondary | ICD-10-CM

## 2015-08-20 ENCOUNTER — Other Ambulatory Visit: Payer: Self-pay | Admitting: *Deleted

## 2015-08-20 DIAGNOSIS — I6523 Occlusion and stenosis of bilateral carotid arteries: Secondary | ICD-10-CM

## 2015-08-22 DIAGNOSIS — Z9289 Personal history of other medical treatment: Secondary | ICD-10-CM

## 2015-08-22 HISTORY — DX: Personal history of other medical treatment: Z92.89

## 2015-08-27 ENCOUNTER — Telehealth (HOSPITAL_COMMUNITY): Payer: Self-pay

## 2015-08-27 NOTE — Telephone Encounter (Signed)
Encounter complete. 

## 2015-08-29 ENCOUNTER — Ambulatory Visit (HOSPITAL_COMMUNITY)
Admission: RE | Admit: 2015-08-29 | Discharge: 2015-08-29 | Disposition: A | Discharge status: Home / Self Care | Payer: BLUE CROSS/BLUE SHIELD | Source: Ambulatory Visit | Attending: Cardiovascular Disease | Admitting: Cardiovascular Disease

## 2015-08-29 DIAGNOSIS — Z8249 Family history of ischemic heart disease and other diseases of the circulatory system: Secondary | ICD-10-CM | POA: Insufficient documentation

## 2015-08-29 DIAGNOSIS — J45909 Unspecified asthma, uncomplicated: Secondary | ICD-10-CM | POA: Insufficient documentation

## 2015-08-29 DIAGNOSIS — Z6841 Body Mass Index (BMI) 40.0 and over, adult: Secondary | ICD-10-CM | POA: Diagnosis not present

## 2015-08-29 DIAGNOSIS — E119 Type 2 diabetes mellitus without complications: Secondary | ICD-10-CM | POA: Insufficient documentation

## 2015-08-29 DIAGNOSIS — Z87891 Personal history of nicotine dependence: Secondary | ICD-10-CM | POA: Insufficient documentation

## 2015-08-29 DIAGNOSIS — I10 Essential (primary) hypertension: Secondary | ICD-10-CM | POA: Diagnosis not present

## 2015-08-29 DIAGNOSIS — I739 Peripheral vascular disease, unspecified: Secondary | ICD-10-CM | POA: Insufficient documentation

## 2015-08-29 DIAGNOSIS — R9439 Abnormal result of other cardiovascular function study: Secondary | ICD-10-CM | POA: Insufficient documentation

## 2015-08-29 DIAGNOSIS — R0602 Shortness of breath: Secondary | ICD-10-CM | POA: Diagnosis not present

## 2015-08-29 DIAGNOSIS — R000 Tachycardia, unspecified: Secondary | ICD-10-CM

## 2015-08-29 DIAGNOSIS — R42 Dizziness and giddiness: Secondary | ICD-10-CM | POA: Diagnosis not present

## 2015-08-29 DIAGNOSIS — E669 Obesity, unspecified: Secondary | ICD-10-CM | POA: Insufficient documentation

## 2015-08-30 ENCOUNTER — Ambulatory Visit (HOSPITAL_COMMUNITY)
Admission: RE | Admit: 2015-08-30 | Discharge: 2015-08-30 | Disposition: A | Discharge status: Home / Self Care | Payer: BLUE CROSS/BLUE SHIELD | Source: Ambulatory Visit | Attending: Cardiology | Admitting: Cardiology

## 2015-08-30 LAB — MYOCARDIAL PERFUSION IMAGING
CHL CUP NUCLEAR SDS: 3
CHL CUP NUCLEAR SRS: 0
CHL CUP NUCLEAR SSS: 3
CHL CUP RESTING HR STRESS: 83 {beats}/min
LV sys vol: 49 mL
LVDIAVOL: 108 mL
NUC STRESS TID: 0.77
Peak HR: 110 {beats}/min

## 2015-08-30 MED ORDER — REGADENOSON 0.4 MG/5ML IV SOLN
0.4000 mg | Freq: Once | INTRAVENOUS | Status: AC
  Administered 2015-08-29: 0.4 mg via INTRAVENOUS

## 2015-08-30 MED ORDER — TECHNETIUM TC 99M SESTAMIBI GENERIC - CARDIOLITE
31.1000 | Freq: Once | INTRAVENOUS | Status: AC | PRN
  Administered 2015-08-30: 31.1 via INTRAVENOUS

## 2015-08-30 MED ORDER — TECHNETIUM TC 99M SESTAMIBI GENERIC - CARDIOLITE
30.8000 | Freq: Once | INTRAVENOUS | Status: AC | PRN
  Administered 2015-08-29: 30.8 via INTRAVENOUS

## 2015-08-30 MED ORDER — AMINOPHYLLINE 25 MG/ML IV SOLN
75.0000 mg | Freq: Once | INTRAVENOUS | Status: AC
  Administered 2015-08-29: 75 mg via INTRAVENOUS

## 2015-09-19 ENCOUNTER — Ambulatory Visit: Payer: BLUE CROSS/BLUE SHIELD | Admitting: Internal Medicine

## 2015-10-09 ENCOUNTER — Ambulatory Visit (INDEPENDENT_AMBULATORY_CARE_PROVIDER_SITE_OTHER): Payer: BLUE CROSS/BLUE SHIELD | Admitting: Internal Medicine

## 2015-10-09 ENCOUNTER — Encounter: Payer: Self-pay | Admitting: Internal Medicine

## 2015-10-09 VITALS — BP 129/85 | HR 112 | Ht 66.0 in | Wt 257.5 lb

## 2015-10-09 DIAGNOSIS — R0609 Other forms of dyspnea: Secondary | ICD-10-CM

## 2015-10-09 DIAGNOSIS — E785 Hyperlipidemia, unspecified: Secondary | ICD-10-CM | POA: Diagnosis not present

## 2015-10-09 DIAGNOSIS — E669 Obesity, unspecified: Secondary | ICD-10-CM

## 2015-10-09 DIAGNOSIS — I10 Essential (primary) hypertension: Secondary | ICD-10-CM

## 2015-10-09 DIAGNOSIS — I739 Peripheral vascular disease, unspecified: Secondary | ICD-10-CM

## 2015-10-09 NOTE — Patient Instructions (Signed)
Your physician wants you to follow-up in: 6 Months. You will receive a reminder letter in the mail two months in advance. If you don't receive a letter, please call our office to schedule the follow-up appointment.   If you need a refill on your cardiac medications before your next appointment, please call your pharmacy.  

## 2015-10-09 NOTE — Progress Notes (Signed)
OFFICE NOTE  Chief Complaint:  Follow-up tests  Primary Care Physician: Ernst Breach, PA-C  HPI:  Charles [name] is a pleasant 64 year old male who is originally from [place]. He is here for evaluation of progressive shortness of breath. He does have a history of asthma but recently is been having some cough and shortness of breath. He also has a history of diabetes on insulin, dyslipidemia, hypertension, and PAD. He tells me in 1996 he had a painful toe and was found to have rest pain in the office and was admitted for peripheral angiogram. He underwent a balloon angioplasty and ultimately required a stent per his report. We do not have those records available and we'll try to request them. He has not had follow-up since that time including no peripheral Dopplers. He's never had carotid Dopplers. He says in the past he's had a stress test but it's been many years ago. He denies any chest pain but is progressively more short of breath. He's also morbidly obese.  I saw [name] back in the office today. He's recently been very anxious since he's had an episode of chest discomfort. This was after lifting a heavy couch. He says symptoms were sharp and intermittent during the first day and improved after a couple of days. What constant and did not sound anginal in nature. He did undergo nuclear stress test which was negative for ischemia with normal LV function. He had bilateral lower extremity ultrasounds which show patent vessels and no evidence of a stenosis of his previously placed peripheral stent. He also had bilateral carotid artery Dopplers which indicate mild carotid disease.  PMHx:  Past Medical History  Diagnosis Date  . Hypertension   . Diabetes mellitus without complication (HCC)   . Obesity   . Hyperlipidemia   . Gout   . Hepatitis C antibody test positive 05/2015    prior infection, resolved    Past Surgical History  Procedure Laterality Date  .  Balloon angioplasty, artery N/A also placed stent    FAMHx:  Family History  Problem Relation Age of Onset  . Asthma Mother   . Heart attack Mother   . Hypertension Mother   . Hyperlipidemia Mother   . Hypertension Father   . Diabetes Father   . HIV Sister   . Liver disease Brother   . Lung cancer Brother   . Hypertension Brother     SOCHx:   reports that he quit smoking about 20 years ago. His smoking use included Cigarettes. He has a 30 pack-year smoking history. He does not have any smokeless tobacco history on file. He reports that he does not drink alcohol or use illicit drugs.  ALLERGIES:  Allergies  Allergen Reactions  . Penicillins Other (See Comments)    Per pt blacked out after injection as a child.     ROS: A comprehensive review of systems was negative.  HOME MEDS: Current Outpatient Prescriptions  Medication Sig Dispense Refill  . Albuterol Sulfate (PROAIR RESPICLICK) 108 (90 BASE) MCG/ACT AEPB Inhale 1 puff into the lungs every 6 (six) hours as needed. 1 each 1  . allopurinol (ZYLOPRIM) 300 MG tablet Take 1 tablet (300 mg total) by mouth daily. 90 tablet 3  . amLODipine (NORVASC) 10 MG tablet Take 1 tablet (10 mg total) by mouth daily. 90 tablet 3  . aspirin 81 MG chewable tablet Chew 1 tablet (81 mg total) by mouth daily. 90 tablet 3  . Azilsartan-Chlorthalidone (  EDARBYCLOR) 40-12.5 MG TABS Take 1 tablet by mouth daily. 30 tablet 2  . cetirizine (ZYRTEC) 10 MG tablet Take 1 tablet (10 mg total) by mouth at bedtime. 90 tablet 3  . Insulin Glargine (LANTUS SOLOSTAR) 100 UNIT/ML Solostar Pen Inject 23 Units into the skin daily at 10 pm. 5 pen 3  . Insulin Glargine (TOUJEO SOLOSTAR) 300 UNIT/ML SOPN Inject 23 Units into the skin at bedtime. 1.5 mL 3  . losartan-hydrochlorothiazide (HYZAAR) 100-12.5 MG per tablet Take 1 tablet by mouth daily. 30 tablet 2  . metFORMIN (GLUCOPHAGE) 1000 MG tablet Take 1 tablet (1,000 mg total) by mouth 2 (two) times daily with a  meal. 180 tablet 3  . mometasone (ASMANEX 60 METERED DOSES) 220 MCG/INH inhaler Inhale 2 puffs into the lungs 2 (two) times daily. 1 Inhaler 2  . Multiple Vitamin (MULTIVITAMIN WITH MINERALS) TABS tablet Take 1 tablet by mouth daily.    Marland Kitchen. omeprazole (PRILOSEC) 40 MG capsule Take 1 capsule (40 mg total) by mouth daily. 90 capsule 1  . ONETOUCH DELICA LANCETS 33G MISC   0  . ONETOUCH VERIO test strip   0  . rosuvastatin (CRESTOR) 10 MG tablet Take 1 tablet (10 mg total) by mouth daily. 90 tablet 3  . nitroGLYCERIN (NITROSTAT) 0.3 MG SL tablet Place 0.3 mg under the tongue every 5 (five) minutes as needed for chest pain.      No current facility-administered medications for this visit.    LABS/IMAGING: No results found for this or any previous visit (from the past 48 hour(s)). No results found.  WEIGHTS: Wt Readings from Last 3 Encounters:  10/09/15 257 lb 8 oz (116.801 kg)  08/29/15 250 lb (113.399 kg)  07/31/15 250 lb 8 oz (113.626 kg)    VITALS: BP 129/85 mmHg  Pulse 112  Ht 5\' 6"  (1.676 m)  Wt 257 lb 8 oz (116.801 kg)  BMI 41.58 kg/m2  EXAM: Deferred  EKG: Deferred  ASSESSMENT: 1. Progressive dyspnea on exertion - low risk nuclear stress test with normal LV function 2. Tachycardia 3. PAD with prior stent to the right leg in 1996 - normal ABIs 4. Asthma 5. Type 2 diabetes 6. Hypertension 7. Dyslipidemia 8. Obesity  PLAN: 1.   Mr. Tiburcio PeaHarris fortunately had a low risk nuclear stress test and normal ABIs without evidence of obstruction in his lower extremities. His peripheral stent appears to be patent. He does have mild bilateral carotid disease. He does need aggressive risk factor modification including continued adequate glucose control, aggressive cholesterol control with a target LDL less than 70 or greater than 50% reduction, and blood pressure control with a goal of around 120/80. No changes were made in his medicines today. Plan to see him back in 6 months.  Chrystie NoseKenneth  C. Hilty, MD, [facility] Center[facility]ACC Attending Cardiologist [facility]  Chrystie NoseKenneth C Hilty 10/09/2015, 4:40 PM

## 2015-10-30 ENCOUNTER — Encounter (HOSPITAL_COMMUNITY): Payer: Self-pay | Admitting: Emergency Medicine

## 2015-10-30 ENCOUNTER — Emergency Department (HOSPITAL_COMMUNITY): Payer: BLUE CROSS/BLUE SHIELD

## 2015-10-30 ENCOUNTER — Emergency Department (HOSPITAL_COMMUNITY)
Admission: EM | Admit: 2015-10-30 | Discharge: 2015-10-30 | Disposition: A | Discharge status: Home / Self Care | Payer: BLUE CROSS/BLUE SHIELD | Attending: Emergency Medicine | Admitting: Emergency Medicine

## 2015-10-30 DIAGNOSIS — Z7984 Long term (current) use of oral hypoglycemic drugs: Secondary | ICD-10-CM | POA: Insufficient documentation

## 2015-10-30 DIAGNOSIS — E119 Type 2 diabetes mellitus without complications: Secondary | ICD-10-CM | POA: Diagnosis not present

## 2015-10-30 DIAGNOSIS — Z87891 Personal history of nicotine dependence: Secondary | ICD-10-CM | POA: Diagnosis not present

## 2015-10-30 DIAGNOSIS — Z794 Long term (current) use of insulin: Secondary | ICD-10-CM | POA: Diagnosis not present

## 2015-10-30 DIAGNOSIS — Z880 Allergy status to penicillin: Secondary | ICD-10-CM | POA: Insufficient documentation

## 2015-10-30 DIAGNOSIS — M25562 Pain in left knee: Secondary | ICD-10-CM | POA: Insufficient documentation

## 2015-10-30 DIAGNOSIS — Z8619 Personal history of other infectious and parasitic diseases: Secondary | ICD-10-CM | POA: Diagnosis not present

## 2015-10-30 DIAGNOSIS — I10 Essential (primary) hypertension: Secondary | ICD-10-CM | POA: Diagnosis not present

## 2015-10-30 DIAGNOSIS — Z79899 Other long term (current) drug therapy: Secondary | ICD-10-CM | POA: Diagnosis not present

## 2015-10-30 DIAGNOSIS — Z7982 Long term (current) use of aspirin: Secondary | ICD-10-CM | POA: Insufficient documentation

## 2015-10-30 DIAGNOSIS — E785 Hyperlipidemia, unspecified: Secondary | ICD-10-CM | POA: Insufficient documentation

## 2015-10-30 DIAGNOSIS — M109 Gout, unspecified: Secondary | ICD-10-CM | POA: Insufficient documentation

## 2015-10-30 DIAGNOSIS — E669 Obesity, unspecified: Secondary | ICD-10-CM | POA: Insufficient documentation

## 2015-10-30 LAB — CBG MONITORING, ED: Glucose-Capillary: 76 mg/dL (ref 65–99)

## 2015-10-30 MED ORDER — HYDROCODONE-ACETAMINOPHEN 5-325 MG PO TABS
2.0000 | ORAL_TABLET | Freq: Once | ORAL | Status: AC
  Administered 2015-10-30: 2 via ORAL
  Filled 2015-10-30: qty 2

## 2015-10-30 MED ORDER — OXYCODONE-ACETAMINOPHEN 5-325 MG PO TABS: 1.0000 | ORAL_TABLET | ORAL | Status: DC | PRN

## 2015-10-30 MED ORDER — ONDANSETRON 4 MG PO TBDP
4.0000 mg | ORAL_TABLET | Freq: Once | ORAL | Status: AC
  Administered 2015-10-30: 4 mg via ORAL
  Filled 2015-10-30: qty 1

## 2015-10-30 NOTE — Discharge Instructions (Signed)
Cryotherapy °Cryotherapy means treatment with cold. Ice or gel packs can be used to reduce both pain and swelling. Ice is the most helpful within the first 24 to 48 hours after an injury or flare-up from overusing a muscle or joint. Sprains, strains, spasms, burning pain, shooting pain, and aches can all be eased with ice. Ice can also be used when recovering from surgery. Ice is effective, has very few side effects, and is safe for most people to use. °PRECAUTIONS  °Ice is not a safe treatment option for people with: °· Raynaud phenomenon. This is a condition affecting small blood vessels in the extremities. Exposure to cold may cause your problems to return. °· Cold hypersensitivity. There are many forms of cold hypersensitivity, including: °· Cold urticaria. Red, itchy hives appear on the skin when the tissues begin to warm after being iced. °· Cold erythema. This is a red, itchy rash caused by exposure to cold. °· Cold hemoglobinuria. Red blood cells break down when the tissues begin to warm after being iced. The hemoglobin that carry oxygen are passed into the urine because they cannot combine with blood proteins fast enough. °· Numbness or altered sensitivity in the area being iced. °If you have any of the following conditions, do not use ice until you have discussed cryotherapy with your caregiver: °· Heart conditions, such as arrhythmia, angina, or chronic heart disease. °· High blood pressure. °· Healing wounds or open skin in the area being iced. °· Current infections. °· Rheumatoid arthritis. °· Poor circulation. °· Diabetes. °Ice slows the blood flow in the region it is applied. This is beneficial when trying to stop inflamed tissues from spreading irritating chemicals to surrounding tissues. However, if you expose your skin to cold temperatures for too long or without the proper protection, you can damage your skin or nerves. Watch for signs of skin damage due to cold. °HOME CARE INSTRUCTIONS °Follow  these tips to use ice and cold packs safely. °· Place a dry or damp towel between the ice and skin. A damp towel will cool the skin more quickly, so you may need to shorten the time that the ice is used. °· For a more rapid response, add gentle compression to the ice. °· Ice for no more than 10 to 20 minutes at a time. The bonier the area you are icing, the less time it will take to get the benefits of ice. °· Check your skin after 5 minutes to make sure there are no signs of a poor response to cold or skin damage. °· Rest 20 minutes or more between uses. °· Once your skin is numb, you can end your treatment. You can test numbness by very lightly touching your skin. The touch should be so light that you do not see the skin dimple from the pressure of your fingertip. When using ice, most people will feel these normal sensations in this order: cold, burning, aching, and numbness. °· Do not use ice on someone who cannot communicate their responses to pain, such as small children or people with dementia. °HOW TO MAKE AN ICE PACK °Ice packs are the most common way to use ice therapy. Other methods include ice massage, ice baths, and cryosprays. Muscle creams that cause a cold, tingly feeling do not offer the same benefits that ice offers and should not be used as a substitute unless recommended by your caregiver. °To make an ice pack, do one of the following: °· Place crushed ice or a   bag of frozen vegetables in a sealable plastic bag. Squeeze out the excess air. Place this bag inside another plastic bag. Slide the bag into a pillowcase or place a damp towel between your skin and the bag.  Mix 3 parts water with 1 part rubbing alcohol. Freeze the mixture in a sealable plastic bag. When you remove the mixture from the freezer, it will be slushy. Squeeze out the excess air. Place this bag inside another plastic bag. Slide the bag into a pillowcase or place a damp towel between your skin and the bag. SEEK MEDICAL CARE  IF:  You develop white spots on your skin. This may give the skin a blotchy (mottled) appearance.  Your skin turns blue or pale.  Your skin becomes waxy or hard.  Your swelling gets worse. MAKE SURE YOU:   Understand these instructions.  Will watch your condition.  Will get help right away if you are not doing well or get worse.   This information is not intended to replace advice given to you by your health care provider. Make sure you discuss any questions you have with your health care provider.   Document Released: 08/03/2011 Document Revised: 12/28/2014 Document Reviewed: 08/03/2011 Elsevier Interactive Patient Education 2016 Elsevier Inc.  Knee Pain Knee pain is a very common symptom and can have many causes. Knee pain often goes away when you follow your health care provider's instructions for relieving pain and discomfort at home. However, knee pain can develop into a condition that needs treatment. Some conditions may include:  Arthritis caused by wear and tear (osteoarthritis).  Arthritis caused by swelling and irritation (rheumatoid arthritis or gout).  A cyst or growth in your knee.  An infection in your knee joint.  An injury that will not heal.  Damage, swelling, or irritation of the tissues that support your knee (torn ligaments or tendinitis). If your knee pain continues, additional tests may be ordered to diagnose your condition. Tests may include X-rays or other imaging studies of your knee. You may also need to have fluid removed from your knee. Treatment for ongoing knee pain depends on the cause, but treatment may include:  Medicines to relieve pain or swelling.  Steroid injections in your knee.  Physical therapy.  Surgery. HOME CARE INSTRUCTIONS  Take medicines only as directed by your health care provider.  Rest your knee and keep it raised (elevated) while you are resting.  Do not do things that cause or worsen pain.  Avoid high-impact  activities or exercises, such as running, jumping rope, or doing jumping jacks.  Apply ice to the knee area:  Put ice in a plastic bag.  Place a towel between your skin and the bag.  Leave the ice on for 20 minutes, 2-3 times a day.  Ask your health care provider if you should wear an elastic knee support.  Keep a pillow under your knee when you sleep.  Lose weight if you are overweight. Extra weight can put pressure on your knee.  Do not use any tobacco products, including cigarettes, chewing tobacco, or electronic cigarettes. If you need help quitting, ask your health care provider. Smoking may slow the healing of any bone and joint problems that you may have. SEEK MEDICAL CARE IF:  Your knee pain continues, changes, or gets worse.  You have a fever along with knee pain.  Your knee buckles or locks up.  Your knee becomes more swollen. SEEK IMMEDIATE MEDICAL CARE IF:   Your knee  joint feels hot to the touch.  You have chest pain or trouble breathing.   This information is not intended to replace advice given to you by your health care provider. Make sure you discuss any questions you have with your health care provider.   Document Released: 10/04/2007 Document Revised: 12/28/2014 Document Reviewed: 07/23/2014 Elsevier Interactive Patient Education [employer] Elsevier Inc.

## 2015-10-30 NOTE — ED Provider Notes (Signed)
CSN: [id number]     Arrival date & time 10/30/15  1638 History  By signing my name below, I, Charles [name], attest that this documentation has been prepared under the direction and in the presence of non-physician practitioner, Arthor CaptainAbigail Overacker, PA-C. Electronically Signed: Freida Busmaniana [name], Scribe. 10/30/2015. 6:08 PM.  Chief Complaint  Patient presents with  . Knee Pain   The history is provided by the patient. No language interpreter was used.     HPI Comments:  Charles [name] is a 64 y.o. male who presents to the Emergency Department complaining of moderate constant left knee pain with associated swelling for ~ 1 week. Pt notes the swelling has mildly improved. He denies h/o same but notes he has a h/o arthritis in his neck and spine. He denies recent injury and trauma. He has been ambulating with a cane since onset of pain with little relief.  He has also been taking advil without relief.   Past Medical History  Diagnosis Date  . Hypertension   . Diabetes mellitus without complication (HCC)   . Obesity   . Hyperlipidemia   . Gout   . Hepatitis C antibody test positive 05/2015    prior infection, resolved   Past Surgical History  Procedure Laterality Date  . Balloon angioplasty, artery N/A also placed stent   Family History  Problem Relation Age of Onset  . Asthma Mother   . Heart attack Mother   . Hypertension Mother   . Hyperlipidemia Mother   . Hypertension Father   . Diabetes Father   . HIV Sister   . Liver disease Brother   . Lung cancer Brother   . Hypertension Brother    Social History  Substance Use Topics  . Smoking status: Former Smoker -- 1.00 packs/day for 30 years    Types: Cigarettes    Quit date: 07/31/1995  . Smokeless tobacco: None  . Alcohol Use: No    Review of Systems  Constitutional: Negative for fever and chills.  Respiratory: Negative for shortness of breath.   Cardiovascular: Negative for chest pain.  Musculoskeletal: Positive for joint  swelling and arthralgias.       Left knee   Allergies  Penicillins  Home Medications   Prior to Admission medications   Medication Sig Start Date End Date Taking? Authorizing Provider  Albuterol Sulfate (PROAIR RESPICLICK) 108 (90 BASE) MCG/ACT AEPB Inhale 1 puff into the lungs every 6 (six) hours as needed. 04/18/15   Kermit Baloavid S Tysinger, PA-C  allopurinol (ZYLOPRIM) 300 MG tablet Take 1 tablet (300 mg total) by mouth daily. 06/12/15   Kermit Baloavid S Tysinger, PA-C  amLODipine (NORVASC) 10 MG tablet Take 1 tablet (10 mg total) by mouth daily. 06/12/15   Kermit Baloavid S Tysinger, PA-C  aspirin 81 MG chewable tablet Chew 1 tablet (81 mg total) by mouth daily. 06/12/15   Kermit Baloavid S Tysinger, PA-C  Azilsartan-Chlorthalidone (EDARBYCLOR) 40-12.5 MG TABS Take 1 tablet by mouth daily. 07/24/15   Kermit Baloavid S Tysinger, PA-C  cetirizine (ZYRTEC) 10 MG tablet Take 1 tablet (10 mg total) by mouth at bedtime. 06/12/15   Kermit Baloavid S Tysinger, PA-C  Insulin Glargine (LANTUS SOLOSTAR) 100 UNIT/ML Solostar Pen Inject 23 Units into the skin daily at 10 pm. 06/12/15   Kermit Baloavid S Tysinger, PA-C  Insulin Glargine (TOUJEO SOLOSTAR) 300 UNIT/ML SOPN Inject 23 Units into the skin at bedtime. 06/13/15   Kermit Baloavid S Tysinger, PA-C  losartan-hydrochlorothiazide (HYZAAR) 100-12.5 MG per tablet Take 1 tablet by mouth daily. 08/07/15  Kermit Balo Tysinger, PA-C  metFORMIN (GLUCOPHAGE) 1000 MG tablet Take 1 tablet (1,000 mg total) by mouth 2 (two) times daily with a meal. 06/12/15   Kermit Balo Tysinger, PA-C  mometasone (ASMANEX 60 METERED DOSES) 220 MCG/INH inhaler Inhale 2 puffs into the lungs 2 (two) times daily. 06/12/15   Kermit Balo Tysinger, PA-C  Multiple Vitamin (MULTIVITAMIN WITH MINERALS) TABS tablet Take 1 tablet by mouth daily.    Historical Provider, MD  nitroGLYCERIN (NITROSTAT) 0.3 MG SL tablet Place 0.3 mg under the tongue every 5 (five) minutes as needed for chest pain.  08/13/14 08/13/15  Historical Provider, MD  omeprazole (PRILOSEC) 40 MG capsule Take 1 capsule  (40 mg total) by mouth daily. 06/12/15   Jac Canavan, PA-C  ONETOUCH DELICA LANCETS 33G MISC  01/19/15   Historical Provider, MD  Letta Pate VERIO test strip  03/26/15   Historical Provider, MD  rosuvastatin (CRESTOR) 10 MG tablet Take 1 tablet (10 mg total) by mouth daily. 06/12/15   Kermit Balo Tysinger, PA-C   BP 167/109 mmHg  Pulse 107  Temp(Src) 97.9 F (36.6 C) (Oral)  Resp 18  SpO2 96% Physical Exam  Constitutional: He is oriented to person, place, and time. He appears well-developed and well-nourished. No distress.  HENT:  Head: Normocephalic and atraumatic.  Eyes: Conjunctivae are normal.  Cardiovascular: Normal rate.   Pulmonary/Chest: Effort normal.  Abdominal: He exhibits no distension.  Musculoskeletal:  Pain along bilateral joint line apparent effusion of left knee Pain with flexion More comfortable with extension NVI Ambulating with cain   Neurological: He is alert and oriented to person, place, and time.  Skin: Skin is warm and dry.  Psychiatric: He has a normal mood and affect.  Nursing note and vitals reviewed.   ED Course  Procedures   DIAGNOSTIC STUDIES:  Oxygen Saturation is 96% on RA, normal by my interpretation.    COORDINATION OF CARE:  5:53 PM Discussed treatment plan with pt at bedside and pt agreed to plan.  Labs Review Labs Reviewed - No data to display  Imaging Review No results found. I have personally reviewed and evaluated these images as part of my medical decision-making.   EKG Interpretation None      MDM   Final diagnoses:  Knee pain, acute, left    MDM Number of Diagnoses or Management Options Knee pain, acute, left:  Patient X-Ray negative for obvious fracture or dislocation. Pain managed in ED. Pt advised to follow up with orthopedics if symptoms persist for possibility of missed fracture diagnosis. Patient given brace while in ED, conservative therapy recommended and discussed. Patient will be dc home & is agreeable  with above plan.   I personally performed the services described in this documentation, which was scribed in my presence. The recorded information has been reviewed and is accurate.      Caesar Mannella, PA-C 10/31/15 2042  Leta Baptist, MD 11/03/15 [phone]

## 2015-10-30 NOTE — ED Notes (Signed)
Pt. Had graham crackers and peanut butter for low blood sugar.

## 2015-10-30 NOTE — ED Notes (Signed)
Pt states that he has had L knee pain and swelling x 1 week. Denies injury or prior problems. Alert and oriented.

## 2015-11-19 ENCOUNTER — Telehealth: Payer: Self-pay | Admitting: Medical

## 2015-11-19 ENCOUNTER — Other Ambulatory Visit: Payer: Self-pay | Admitting: Medical

## 2015-11-19 MED ORDER — LOSARTAN POTASSIUM-HCTZ 100-12.5 MG PO TABS: 1.0000 | ORAL_TABLET | Freq: Every day | ORAL | Status: DC

## 2015-11-19 NOTE — Telephone Encounter (Signed)
Med sent and schedule him for med check, fasting

## 2015-11-19 NOTE — Telephone Encounter (Signed)
[facility]

## 2015-11-19 NOTE — Telephone Encounter (Signed)
Appt made

## 2015-11-29 ENCOUNTER — Encounter: Payer: Self-pay | Admitting: Medical

## 2015-11-29 ENCOUNTER — Ambulatory Visit (INDEPENDENT_AMBULATORY_CARE_PROVIDER_SITE_OTHER): Payer: BLUE CROSS/BLUE SHIELD | Admitting: Medical

## 2015-11-29 VITALS — BP 150/108 | HR 97 | Wt 267.0 lb

## 2015-11-29 DIAGNOSIS — M25569 Pain in unspecified knee: Secondary | ICD-10-CM | POA: Insufficient documentation

## 2015-11-29 DIAGNOSIS — I739 Peripheral vascular disease, unspecified: Secondary | ICD-10-CM | POA: Diagnosis not present

## 2015-11-29 DIAGNOSIS — E785 Hyperlipidemia, unspecified: Secondary | ICD-10-CM

## 2015-11-29 DIAGNOSIS — Z23 Encounter for immunization: Secondary | ICD-10-CM | POA: Diagnosis not present

## 2015-11-29 DIAGNOSIS — M1000 Idiopathic gout, unspecified site: Secondary | ICD-10-CM | POA: Insufficient documentation

## 2015-11-29 DIAGNOSIS — E669 Obesity, unspecified: Secondary | ICD-10-CM | POA: Diagnosis not present

## 2015-11-29 DIAGNOSIS — R635 Abnormal weight gain: Secondary | ICD-10-CM | POA: Diagnosis not present

## 2015-11-29 DIAGNOSIS — E118 Type 2 diabetes mellitus with unspecified complications: Secondary | ICD-10-CM | POA: Diagnosis not present

## 2015-11-29 DIAGNOSIS — I10 Essential (primary) hypertension: Secondary | ICD-10-CM | POA: Diagnosis not present

## 2015-11-29 DIAGNOSIS — M542 Cervicalgia: Secondary | ICD-10-CM

## 2015-11-29 DIAGNOSIS — M25562 Pain in left knee: Secondary | ICD-10-CM

## 2015-11-29 DIAGNOSIS — M1A00X0 Idiopathic chronic gout, unspecified site, without tophus (tophi): Secondary | ICD-10-CM | POA: Diagnosis not present

## 2015-11-29 LAB — HEMOGLOBIN A1C
Hgb A1c MFr Bld: 7 % — ABNORMAL HIGH (ref <5.7)
MEAN PLASMA GLUCOSE: 154 mg/dL — AB (ref <117)

## 2015-11-29 LAB — COMPREHENSIVE METABOLIC PANEL
ALBUMIN: 4.2 g/dL (ref 3.6–5.1)
ALT: 28 U/L (ref 9–46)
AST: 24 U/L (ref 10–35)
Alkaline Phosphatase: 96 U/L (ref 40–115)
BILIRUBIN TOTAL: 0.4 mg/dL (ref 0.2–1.2)
BUN: 17 mg/dL (ref 7–25)
CHLORIDE: 102 mmol/L (ref 98–110)
CO2: 26 mmol/L (ref 20–31)
CREATININE: 1.2 mg/dL (ref 0.70–1.25)
Calcium: 9.6 mg/dL (ref 8.6–10.3)
GLUCOSE: 117 mg/dL — AB (ref 65–99)
Potassium: 3.8 mmol/L (ref 3.5–5.3)
SODIUM: 141 mmol/L (ref 135–146)
Total Protein: 7.5 g/dL (ref 6.1–8.1)

## 2015-11-29 LAB — URIC ACID: Uric Acid, Serum: 4.3 mg/dL (ref 4.0–7.8)

## 2015-11-29 NOTE — Progress Notes (Signed)
Subjective: Chief Complaint  Patient presents with  . med check    fasting. no concerns   Here for med check.  Since last visit had episode of knee pain, swelling, went to the ED.   Stayed off the knee for a period of time, was given pain pill.     Diabetes type 2 - compliant with Metformin 1000mg  BID, taking 23 units Toujeo daily.  Insurance denied Lantus.  Checks glucose most days per week.  On days he checks it looks good  Hyperlipidemia - compliant with Crestor 10mg  daily without side effects.  HTN - compliant with losartan HCT as insurance wouldn't cover edarbychlor, compliant with Norvasc 10mg  daily.  Doesn't check BPs regularly.  Gout - compliant with Allopurinol 300mg  daily.  Neck pain ongoing, wants some relief, wonder if he needs other eval  Wonders if he should apply for disability given his limitations and health issues.  No other c/o  Objective: BP 150/108 mmHg  Pulse 97  Wt 267 lb (121.11 kg)  SpO2 98%  Wt Readings from Last 3 Encounters:  11/29/15 267 lb (121.11 kg)  10/09/15 257 lb 8 oz (116.801 kg)  08/29/15 250 lb (113.399 kg)   BP Readings from Last 3 Encounters:  11/29/15 150/108  10/30/15 128/82  10/09/15 129/85   General appearance: alert, no distress, WD/WN, obese  AA male Neck: supple, nontender today, no obvious decrease in ROM, no lymphadenopathy, no thyromegaly, no masses, no bruits Heart: RRR, normal S1, S2, no murmurs Lungs: CTA bilaterally, no wheezes, rhonchi, or rales Pulses: 1+ pedal pulses right leg , 2+ left leg, UE pulse normal, normal cap refill Ext: no edema MSK: left knee nontender, no swelling, normal ROM, no laxity, rest of legs nontender, no swelling   Assessment: Encounter Diagnoses  Name Primary?  . Essential hypertension Yes  . Hyperlipidemia   . PAD (peripheral artery disease) (HCC)   . Diabetes mellitus with complication (HCC)   . Knee pain, acute, left   . Cervical pain (neck)   . Need for prophylactic  vaccination and inoculation against influenza   . Weight gain   . Obesity     Plan: HTN - c/t Norvasc 10mg  daily, c/t Losartan 100/12.5mg  daily, and pending labs, consider modification as BP not at goal.  Consider spironolactone and stopping HCT, consider adding beta blocker  Hyperlipidemia - c/t Crestor 10mg  daily, Aspirin 81mg  daily, and low cholesterol diet  diabetes type 2 - c/t metformin 1000mg  BID, Toujeo 23 units daily, f/u pending labs, c/t foot checks, yearly eye exam, diabetic diet  Obesity - needs to work on losing weight.  Pending labs, consider changing to farxiga or Victoza for benefit of weight loss as well.  Discussed cardiac rehab or other supervised exercise program  Neck pain - pending labs, consider PT vs MRI.    Gout - labs today, c/t Allopurinol 300mg  daily   Counseled on the influenza virus vaccine.  Vaccine information sheet given.  Influenza vaccine given after consent obtained.  PVD - c/t to work on risk factor modification  F/u pending labs

## 2015-12-02 ENCOUNTER — Other Ambulatory Visit: Payer: Self-pay | Admitting: Medical

## 2015-12-02 MED ORDER — INSULIN GLARGINE 300 UNIT/ML ~~LOC~~ SOPN: 25.0000 [IU] | PEN_INJECTOR | Freq: Every day | SUBCUTANEOUS | Status: DC

## 2015-12-02 MED ORDER — ALLOPURINOL 300 MG PO TABS: 300.0000 mg | ORAL_TABLET | Freq: Every day | ORAL | Status: DC

## 2015-12-02 MED ORDER — SPIRONOLACTONE 25 MG PO TABS: 25.0000 mg | ORAL_TABLET | Freq: Every day | ORAL | Status: DC

## 2015-12-02 MED ORDER — AMLODIPINE BESYLATE 10 MG PO TABS: 10.0000 mg | ORAL_TABLET | Freq: Every day | ORAL | Status: DC

## 2015-12-02 MED ORDER — LOSARTAN POTASSIUM-HCTZ 100-12.5 MG PO TABS: 1.0000 | ORAL_TABLET | Freq: Every day | ORAL | Status: DC

## 2015-12-02 MED ORDER — METFORMIN HCL 1000 MG PO TABS: 1000.0000 mg | ORAL_TABLET | Freq: Two times a day (BID) | ORAL | Status: DC

## 2015-12-02 MED ORDER — ASPIRIN 81 MG PO CHEW: 81.0000 mg | CHEWABLE_TABLET | Freq: Every day | ORAL | Status: DC

## 2015-12-02 MED ORDER — OMEPRAZOLE 40 MG PO CPDR: 40.0000 mg | DELAYED_RELEASE_CAPSULE | Freq: Every day | ORAL | Status: DC

## 2015-12-03 ENCOUNTER — Other Ambulatory Visit: Payer: Self-pay | Admitting: Medical

## 2015-12-03 MED ORDER — LIRAGLUTIDE 18 MG/3ML ~~LOC~~ SOPN: 18.0000 mg | PEN_INJECTOR | Freq: Every day | SUBCUTANEOUS | Status: DC

## 2015-12-04 ENCOUNTER — Telehealth: Payer: Self-pay | Admitting: Medical

## 2015-12-04 MED ORDER — LIRAGLUTIDE 18 MG/3ML ~~LOC~~ SOPN: 1.8000 mg | PEN_INJECTOR | Freq: Every day | SUBCUTANEOUS | Status: DC

## 2015-12-04 NOTE — Telephone Encounter (Signed)
Recv'd fax from pharmacy needing clarification for Victoza dosage, per Vincenza HewsShane should have been 1.8mg  Called pharmacy & corrected

## 2016-01-02 ENCOUNTER — Telehealth: Payer: Self-pay | Admitting: Medical

## 2016-01-02 ENCOUNTER — Other Ambulatory Visit: Payer: Self-pay | Admitting: Medical

## 2016-01-02 MED ORDER — BAYER CONTOUR NEXT MONITOR W/DEVICE KIT: PACK | Status: DC

## 2016-01-02 MED ORDER — BLOOD GLUCOSE TEST VI STRP: ORAL_STRIP | Status: DC

## 2016-01-02 MED ORDER — ALBIGLUTIDE 50 MG ~~LOC~~ PEN: 50.0000 mg | PEN_INJECTOR | SUBCUTANEOUS | Status: DC

## 2016-01-02 NOTE — Telephone Encounter (Signed)
Pt states needs new meter & strips sent to new pharmacy [facility]. Insurance pays for Contour Next Meter.  States he test 2-3 times a day & he is on insulin.  Also states the Victoza that he tried he had a bad reaction to, had nausea, vomiting, diarrhea, headache & was unable to tolerate it and wants something else sent in.

## 2016-01-02 NOTE — Telephone Encounter (Signed)
We can try switching to different pen.   I sent Tanzeum pen for weekly (not daily) injection.  Try this instead of Victoza, however, if too expensive, then call back.  Call out his diabetes testing supplies

## 2016-01-02 NOTE — Telephone Encounter (Signed)
Pt says Tanzeum is not covered by his insurance so need a different med. Also need test strip, monitor and all meds sent to [email or web address]. [facility]

## 2016-01-02 NOTE — Telephone Encounter (Signed)
Pt is aware. And sent in meter and strips pt does not want lancets

## 2016-01-03 ENCOUNTER — Other Ambulatory Visit: Payer: Self-pay | Admitting: Medical

## 2016-01-03 MED ORDER — BLOOD GLUCOSE TEST VI STRP: ORAL_STRIP | Status: DC

## 2016-01-03 MED ORDER — BAYER CONTOUR NEXT MONITOR W/DEVICE KIT: PACK | Status: DC

## 2016-01-03 MED ORDER — DULAGLUTIDE 1.5 MG/0.5ML ~~LOC~~ SOAJ: 1.5000 mg | SUBCUTANEOUS | Status: DC

## 2016-01-03 NOTE — Telephone Encounter (Signed)
Reordered for his new pharmacy

## 2016-01-03 NOTE — Telephone Encounter (Signed)
pls call out test strips.

## 2016-01-05 ENCOUNTER — Telehealth: Payer: Self-pay | Admitting: Medical

## 2016-01-05 NOTE — Telephone Encounter (Signed)
P.A. TRULICITY 

## 2016-01-10 NOTE — Telephone Encounter (Signed)
P.A. Approved, called pharmacy & went thru for $25, left message for pt

## 2016-01-10 NOTE — Telephone Encounter (Signed)
Discontinued this medication due to Trulicity being approved

## 2016-01-17 ENCOUNTER — Telehealth: Payer: Self-pay | Admitting: Medical

## 2016-01-17 MED ORDER — LOSARTAN POTASSIUM-HCTZ 100-12.5 MG PO TABS: 1.0000 | ORAL_TABLET | Freq: Every day | ORAL | Status: DC

## 2016-01-17 NOTE — Telephone Encounter (Signed)
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

## 2016-01-17 NOTE — Telephone Encounter (Signed)
Pt is due for followup and stated he would call and make it before he runs out of the 30 days I am sending in

## 2016-02-27 ENCOUNTER — Telehealth: Payer: Self-pay

## 2016-02-27 ENCOUNTER — Ambulatory Visit (INDEPENDENT_AMBULATORY_CARE_PROVIDER_SITE_OTHER): Payer: BLUE CROSS/BLUE SHIELD | Admitting: Medical

## 2016-02-27 ENCOUNTER — Encounter: Payer: Self-pay | Admitting: Medical

## 2016-02-27 VITALS — BP 138/98 | HR 68 | Wt 268.0 lb

## 2016-02-27 DIAGNOSIS — M5412 Radiculopathy, cervical region: Secondary | ICD-10-CM | POA: Diagnosis not present

## 2016-02-27 DIAGNOSIS — R110 Nausea: Secondary | ICD-10-CM

## 2016-02-27 DIAGNOSIS — M542 Cervicalgia: Secondary | ICD-10-CM | POA: Diagnosis not present

## 2016-02-27 DIAGNOSIS — R109 Unspecified abdominal pain: Secondary | ICD-10-CM | POA: Diagnosis not present

## 2016-02-27 DIAGNOSIS — Z91199 Patient's noncompliance with other medical treatment and regimen due to unspecified reason: Secondary | ICD-10-CM | POA: Insufficient documentation

## 2016-02-27 DIAGNOSIS — I739 Peripheral vascular disease, unspecified: Secondary | ICD-10-CM | POA: Diagnosis not present

## 2016-02-27 DIAGNOSIS — I10 Essential (primary) hypertension: Secondary | ICD-10-CM

## 2016-02-27 DIAGNOSIS — Z9119 Patient's noncompliance with other medical treatment and regimen: Secondary | ICD-10-CM

## 2016-02-27 DIAGNOSIS — R202 Paresthesia of skin: Secondary | ICD-10-CM | POA: Diagnosis not present

## 2016-02-27 DIAGNOSIS — E118 Type 2 diabetes mellitus with unspecified complications: Secondary | ICD-10-CM | POA: Diagnosis not present

## 2016-02-27 DIAGNOSIS — E785 Hyperlipidemia, unspecified: Secondary | ICD-10-CM

## 2016-02-27 DIAGNOSIS — G8929 Other chronic pain: Secondary | ICD-10-CM

## 2016-02-27 DIAGNOSIS — E669 Obesity, unspecified: Secondary | ICD-10-CM | POA: Diagnosis not present

## 2016-02-27 LAB — COMPREHENSIVE METABOLIC PANEL
ALT: 27 U/L (ref 9–46)
AST: 20 U/L (ref 10–35)
Albumin: 4.2 g/dL (ref 3.6–5.1)
Alkaline Phosphatase: 81 U/L (ref 40–115)
BUN: 19 mg/dL (ref 7–25)
CALCIUM: 9.7 mg/dL (ref 8.6–10.3)
CO2: 24 mmol/L (ref 20–31)
Chloride: 102 mmol/L (ref 98–110)
Creat: 1.27 mg/dL — ABNORMAL HIGH (ref 0.70–1.25)
GLUCOSE: 127 mg/dL — AB (ref 65–99)
POTASSIUM: 4.7 mmol/L (ref 3.5–5.3)
Sodium: 139 mmol/L (ref 135–146)
Total Bilirubin: 0.3 mg/dL (ref 0.2–1.2)
Total Protein: 7.8 g/dL (ref 6.1–8.1)

## 2016-02-27 LAB — CBC
HEMATOCRIT: 42.2 % (ref 39.0–52.0)
Hemoglobin: 13.4 g/dL (ref 13.0–17.0)
MCH: 24 pg — ABNORMAL LOW (ref 26.0–34.0)
MCHC: 31.8 g/dL (ref 30.0–36.0)
MCV: 75.6 fL — AB (ref 78.0–100.0)
MPV: 10.3 fL (ref 8.6–12.4)
Platelets: 243 10*3/uL (ref 150–400)
RBC: 5.58 MIL/uL (ref 4.22–5.81)
RDW: 17.5 % — ABNORMAL HIGH (ref 11.5–15.5)
WBC: 8.3 10*3/uL (ref 4.0–10.5)

## 2016-02-27 LAB — LIPASE: Lipase: 81 U/L — ABNORMAL HIGH (ref 7–60)

## 2016-02-27 MED ORDER — SPIRONOLACTONE 25 MG PO TABS: 25.0000 mg | ORAL_TABLET | Freq: Every day | ORAL | Status: DC

## 2016-02-27 MED ORDER — LOSARTAN POTASSIUM-HCTZ 100-12.5 MG PO TABS: 1.0000 | ORAL_TABLET | Freq: Every day | ORAL | Status: DC

## 2016-02-27 NOTE — Progress Notes (Signed)
Subjective: Chief Complaint  Patient presents with  . Follow-up    on victoza. but switched to trulicity. having an allergic reaction to it. has a constant pain in his stomach. unsure if he should be taking medication with pain in his stomach. stopped taking medication. victoza he had n/v and dizziness. metformin is not working he states. 120-165 fasting. sugar dropped to 97 a few days ago.    Has a constant pain in stomach.  Has had this for a while.  Tried anti gas medication in the past.   Still gets some gas.  Pain is on the right, sometimes left.   Has had this for almost a year.    Not worse with eating.   Just comes out of the blue, flares up from time to time.   No prior scans of abdomen.    diabetes - taking metformin only.  Got nausea with Victoza.  After reading about Truliicty , decided not to take this.  For whatever reason he thinks we stopped Toujeo last visit so he hasn't been taking this. Glucose running 130-160s.  HTN - he is taking spironolactone, amlodipine, but he ran out of Losartan HCT as he didn't come back as scheduled sooner for f/u  Has hx/o chronic neck pain.  Continues to have neck pain.  sometimes gets pain in left arm, sometimes right arm.  Pain is severe at times.  Has not done physical therapy.   always feels numb in left hand.  Was recommended to have neck surgery years ago but was told he had 50/50 change of getting paralyzed so he never pursued surgery  hyperlipemia - compliant with statin and aspirin daily  He is not exercising.  Using little diet discretion.  He notes having a colonoscopy 2016.   Past Medical History  Diagnosis Date  . Hypertension   . Diabetes mellitus without complication (HCC)   . Obesity   . Hyperlipidemia   . Gout   . Hepatitis C antibody test positive 05/2015    prior infection, resolved  . History of cardiovascular stress test 9/16    Lexiscan, EF 55%, no ischemia, Dr. Rennis GoldenHilty  . Abnormal carotid ultrasound 07/2015    mild  bilateral stenosis, repeat in 1 year.  Dr. Rennis GoldenHilty  . PVD (peripheral vascular disease) (HCC) 07/2015    normal ABIs, Dr. Rennis GoldenHilty  . Noncompliance    Past Surgical History  Procedure Laterality Date  . Balloon angioplasty, artery N/A also placed stent  . Colonoscopy  2016    Dr. Elnoria HowardHung   ROS as in subjective      Objective: BP 138/98 mmHg  Pulse 68  Wt 268 lb (121.564 kg)  Wt Readings from Last 3 Encounters:  02/27/16 268 lb (121.564 kg)  11/29/15 267 lb (121.11 kg)  10/09/15 257 lb 8 oz (116.801 kg)     Objective: BP 138/98 mmHg  Pulse 68  Wt 268 lb (121.564 kg)  General appearance: alert, no distress, WD/WN, obese AA male Neck: tender posterior neck, somewhat decreased rotation bilat, but overall neck ROM relative normal, supple, no lymphadenopathy, no thyromegaly, no masses Heart: RRR, normal S1, S2, no murmurs Lungs: CTA bilaterally, no wheezes, rhonchi, or rales Abdomen: +bs, soft,mild generalized tenderness, non distended, no masses, no hepatomegaly, no splenomegaly Back: non tender Musculoskeletal: arms nontender, no swelling, no obvious deformity Extremities: no edema, no cyanosis, no clubbing Pulses: 1+ symmetric, upper and lower extremities, normal cap refill Neurological: strength and sensation seems to be relatively normal  of UE    Assessment: Encounter Diagnoses  Name Primary?  . Diabetes mellitus with complication (HCC) Yes  . Abdominal pain, unspecified abdominal location   . Nausea without vomiting   . Chronic neck pain   . Noncompliance   . Essential hypertension   . Cervical radiculitis   . Left hand paresthesia   . PAD (peripheral artery disease) (HCC)   . Hyperlipidemia   . Obesity     Plan: Diabetes type 2 - noncompliant.  He is not exercising, doesn't seem to be motivated to exercise, not really using diet discretion, but seems to blame me for his lack of sugar control, not willing to take certain medications.  He is agreeable today to try  Trulicity.  He will continue Metformin for now.     abdominal pain - possibly due to metformin (nausea/GI upset) but unclear.   [place] ordered, labs today  Nausea - with Victoza, but possible metformin related as well  chronic neck pain, radiculitis, left hand numbness - referral for MRI at his request.     Noncompliance - with diet, exercise, and medications  HTN - c/t Spironolactone  daily, c/t Losartan HCT 100/12.5mg  daily, c/t Amlodpieine  daily  PAD - reviewed his 2016 cardiology notes including ABI and carotid ultrasounds  Hyperlipidemia - c/t statin, ASA  daily  Obesity - advised he needs to do his part to work on exercise, diet, and weight loss  We will get records from colonoscopy done 2016 with Dr. Elnoria Howard

## 2016-02-27 NOTE — Telephone Encounter (Signed)
Mri needs a peer to peer review the number is [phone]

## 2016-02-28 ENCOUNTER — Other Ambulatory Visit: Payer: Self-pay | Admitting: Medical

## 2016-02-28 MED ORDER — EMPAGLIFLOZIN-LINAGLIPTIN 25-5 MG PO TABS: 1.0000 | ORAL_TABLET | Freq: Every day | ORAL | Status: DC

## 2016-02-28 NOTE — Telephone Encounter (Signed)
Lets try again, see other message from lab results

## 2016-03-01 ENCOUNTER — Telehealth: Payer: Self-pay | Admitting: Medical

## 2016-03-02 ENCOUNTER — Telehealth: Payer: Self-pay | Admitting: Medical

## 2016-03-02 NOTE — Telephone Encounter (Signed)
[facility],  Spoke with pt and he does not have any medication to start yet & has stopped the Metformin.  I told him I could see if we had samples but he states he doesn't have a way to pick them up today.

## 2016-03-02 NOTE — Telephone Encounter (Signed)
Call him back today/Monday  1- get update on belly pain or any belly symptoms.   2- since his lab was abnormal, I do recommend abdominal imaging.  Lets start with ultrasound which should be easier to get covered by insurance and can help evaluate the liver and gall bladder.   3- make sure he stopped Metformin, he already stopped Toujeo, make sure he started Glyxambi.  If not more abdominal pain, then can start Trulicity. 4-as a follow up from the phone call last week, he initially told me in the room that he wanted a neck scan.  However, when you called him he seemed to not want the imaging.   This is sending mixed messages.   Since he has chronic neck pain, I recommend referral to ortho/[facility].  They can then decide if he needs neck imaging  See if in agreement, any other concerns?

## 2016-03-02 NOTE — Telephone Encounter (Signed)
See if we have some Glyxambi samples to give him

## 2016-03-02 NOTE — Telephone Encounter (Signed)
Samples up front 

## 2016-03-02 NOTE — Telephone Encounter (Signed)
Pt stated that he has not started the glyxambi and still took the metformin which he wants to stop completely because it is causing him dizziness and light headedness on top of the stomach pain. He does not want u/s until he has been on glyxambi for a little but because he believe the metformin is causing all this. Sample are upfront for pt. Pt declines ortho referral right now even though he gave the ok on Friday. Stated he is worrying about his diabetes right now and everything else can wait. Told him when [facility] called just tell them he will call back to schedule that way he can give him self some time to be [facility] the [facility] and he said okay.

## 2016-03-02 NOTE — Telephone Encounter (Signed)
Just to clarify, we had already told him to stop the Metformin

## 2016-03-02 NOTE — Telephone Encounter (Signed)
I told him even though he hasnt gotten the other medication he still needs to stop metformin if it is causing him so much discomfort he stated he was still taking it until he got Glyxambi. He is aware he was supposed to stop taking it

## 2016-03-06 ENCOUNTER — Other Ambulatory Visit: Payer: BLUE CROSS/BLUE SHIELD

## 2016-03-08 ENCOUNTER — Other Ambulatory Visit: Payer: BLUE CROSS/BLUE SHIELD

## 2016-03-12 ENCOUNTER — Encounter: Payer: Self-pay | Admitting: Family Medicine

## 2016-03-13 NOTE — Telephone Encounter (Signed)
Called [employer] t# [phone] and P.A. Was denied, pt needs trial of Invokana.  Do you want to switch?

## 2016-03-13 NOTE — Telephone Encounter (Signed)
Called pt and he states he only has enough meds til Monday but doesn't have transportation to come and pick up samples.  He states he will try the new medication Invokana and let us know if he has any problems. Please send in if you are ok with switching

## 2016-03-16 ENCOUNTER — Other Ambulatory Visit: Payer: Self-pay | Admitting: Medical

## 2016-03-16 MED ORDER — OMEPRAZOLE 40 MG PO CPDR: 40.0000 mg | DELAYED_RELEASE_CAPSULE | Freq: Every day | ORAL | Status: DC

## 2016-03-16 MED ORDER — CANAGLIFLOZIN 100 MG PO TABS: 100.0000 mg | ORAL_TABLET | Freq: Every day | ORAL | Status: DC

## 2016-03-16 MED ORDER — DULAGLUTIDE 1.5 MG/0.5ML ~~LOC~~ SOAJ: 1.5000 mg | SUBCUTANEOUS | Status: DC

## 2016-03-16 NOTE — Telephone Encounter (Signed)
I sent Invokana.  So at this point, he should be taking Invokana 100mg  daily and Trulicity injection once weekly.

## 2016-03-16 NOTE — Telephone Encounter (Signed)
Called pharmacy & Invokana went thru for $20, called pt and informed

## 2016-04-24 ENCOUNTER — Ambulatory Visit (INDEPENDENT_AMBULATORY_CARE_PROVIDER_SITE_OTHER): Payer: BLUE CROSS/BLUE SHIELD | Admitting: Internal Medicine

## 2016-04-24 ENCOUNTER — Encounter: Payer: Self-pay | Admitting: Internal Medicine

## 2016-04-24 VITALS — BP 130/90 | HR 97 | Ht 66.0 in | Wt 265.0 lb

## 2016-04-24 DIAGNOSIS — M542 Cervicalgia: Secondary | ICD-10-CM

## 2016-04-24 DIAGNOSIS — E118 Type 2 diabetes mellitus with unspecified complications: Secondary | ICD-10-CM | POA: Diagnosis not present

## 2016-04-24 DIAGNOSIS — E785 Hyperlipidemia, unspecified: Secondary | ICD-10-CM

## 2016-04-24 DIAGNOSIS — I739 Peripheral vascular disease, unspecified: Secondary | ICD-10-CM

## 2016-04-24 DIAGNOSIS — I10 Essential (primary) hypertension: Secondary | ICD-10-CM | POA: Diagnosis not present

## 2016-04-24 NOTE — Patient Instructions (Signed)
You have been referred to Dr. Donalee CitrinGary Cram - neurosurgeon   Your physician recommends that you return for lab work FASTING to check cholesterol   Your physician wants you to follow-up in: 6 months with Dr. Rennis GoldenHilty.  You will receive a reminder letter in the mail two months in advance. If you don't receive a letter, please call our office to schedule the follow-up appointment.

## 2016-04-24 NOTE — Progress Notes (Signed)
OFFICE NOTE  Chief Complaint:  Routine follow-up, occasional chest pain  Primary Care Physician: Crisoforo Oxford, PA-C  HPI:  Levorn Oleski is a pleasant 65 year old male who is originally from [place]. He is here for evaluation of progressive shortness of breath. He does have a history of asthma but recently is been having some cough and shortness of breath. He also has a history of diabetes on insulin, dyslipidemia, hypertension, and PAD. He tells me in 1996 he had a painful toe and was found to have rest pain in the office and was admitted for peripheral angiogram. He underwent a balloon angioplasty and ultimately required a stent per his report. We do not have those records available and we'll try to request them. He has not had follow-up since that time including no peripheral Dopplers. He's never had carotid Dopplers. He says in the past he's had a stress test but it's been many years ago. He denies any chest pain but is progressively more short of breath. He's also morbidly obese.  I saw Mr. Panetta back in the office today. He's recently been very anxious since he's had an episode of chest discomfort. This was after lifting a heavy couch. He says symptoms were sharp and intermittent during the first day and improved after a couple of days. What constant and did not sound anginal in nature. He did undergo nuclear stress test which was negative for ischemia with normal LV function. He had bilateral lower extremity ultrasounds which show patent vessels and no evidence of a stenosis of his previously placed peripheral stent. He also had bilateral carotid artery Dopplers which indicate mild carotid disease.  04/24/2016  Mr. Groninger returns today for follow-up. He's had some intermittent episodes of sharp chest pain. This is also associated with neck pain and some pain that radiates down his arms. He also reports some loose stool and diarrhea. This was worse on metformin  however after discontinuing he still has symptoms. He does get some dizziness which I suspect is related to neck pain. He denies any claudication. His Dopplers in August of last year showed normal ABIs and mild bilateral carotid disease. This is due for follow-up in August of next year. He is also overdue for a lipid profile.  PMHx:  Past Medical History  Diagnosis Date  . Hypertension   . Diabetes mellitus without complication ([place])   . Obesity   . Hyperlipidemia   . Gout   . Hepatitis C antibody test positive 05/2015    prior infection, resolved  . History of cardiovascular stress test 9/16    Lexiscan, EF 55%, no ischemia, Dr. Debara Pickett  . Abnormal carotid ultrasound 07/2015    mild bilateral stenosis, repeat in 1 year.  Dr. Debara Pickett  . PVD (peripheral vascular disease) ([place]) 07/2015    normal ABIs, Dr. Debara Pickett  . Noncompliance     Past Surgical History  Procedure Laterality Date  . Balloon angioplasty, artery N/A also placed stent  . Colonoscopy  2016    Dr. Benson Norway    FAMHx:  Family History  Problem Relation Age of Onset  . Asthma Mother   . Heart attack Mother   . Hypertension Mother   . Hyperlipidemia Mother   . Hypertension Father   . Diabetes Father   . HIV Sister   . Liver disease Brother   . Lung cancer Brother   . Hypertension Brother     SOCHx:   reports that he quit smoking  about 20 years ago. His smoking use included Cigarettes. He has a 30 pack-year smoking history. He does not have any smokeless tobacco history on file. He reports that he does not drink alcohol or use illicit drugs.  ALLERGIES:  Allergies  Allergen Reactions  . Penicillins Other (See Comments)    Per pt blacked out after injection as a child.     ROS: A comprehensive review of systems was negative.  HOME MEDS: Current Outpatient Prescriptions  Medication Sig Dispense Refill  . Albuterol Sulfate (PROAIR RESPICLICK) 836 (90 BASE) MCG/ACT AEPB Inhale 1 puff into the lungs every 6 (six) hours  as needed. 1 each 1  . allopurinol (ZYLOPRIM) 300 MG tablet Take 1 tablet (300 mg total) by mouth daily. 90 tablet 3  . amLODipine (NORVASC) 10 MG tablet Take 1 tablet (10 mg total) by mouth daily. 90 tablet 3  . aspirin 81 MG chewable tablet Chew 1 tablet (81 mg total) by mouth daily. 90 tablet 3  . Blood Glucose Monitoring Suppl (BAYER CONTOUR NEXT MONITOR) w/Device KIT Test 2-3 times a day 1 kit 0  . canagliflozin (INVOKANA) 100 MG TABS tablet Take 1 tablet (100 mg total) by mouth daily before breakfast. 30 tablet 5  . Glucose Blood (BLOOD GLUCOSE TEST STRIPS) STRP Test 2-3 times daily. Uses a contour next meter 100 each 0  . losartan-hydrochlorothiazide (HYZAAR) 100-12.5 MG tablet Take 1 tablet by mouth daily. 90 tablet 3  . Multiple Vitamin (MULTIVITAMIN WITH MINERALS) TABS tablet Take 1 tablet by mouth daily.    Marland Kitchen omeprazole (PRILOSEC) 40 MG capsule Take 1 capsule (40 mg total) by mouth daily. 90 capsule 3  . ONETOUCH DELICA LANCETS 62H MISC   0  . oxyCODONE-acetaminophen (PERCOCET) 5-325 MG tablet Take 1-2 tablets by mouth every 4 (four) hours as needed. 20 tablet 0  . rosuvastatin (CRESTOR) 10 MG tablet Take 1 tablet (10 mg total) by mouth daily. 90 tablet 3  . spironolactone (ALDACTONE) 25 MG tablet Take 1 tablet (25 mg total) by mouth daily. 90 tablet 3  . nitroGLYCERIN (NITROSTAT) 0.3 MG SL tablet Place 0.3 mg under the tongue every 5 (five) minutes as needed for chest pain. Reported on 02/27/2016     No current facility-administered medications for this visit.    LABS/IMAGING: No results found for this or any previous visit (from the past 48 hour(s)). No results found.  WEIGHTS: Wt Readings from Last 3 Encounters:  04/24/16 265 lb (120.203 kg)  02/27/16 268 lb (121.564 kg)  11/29/15 267 lb (121.11 kg)    VITALS: BP 130/90 mmHg  Pulse 97  Ht '5\' 6"'  (1.676 m)  Wt 265 lb (120.203 kg)  BMI 42.79 kg/m2  EXAM: General appearance: alert, no distress and morbidly obese Neck:  no carotid bruit and no JVD Lungs: clear to auscultation bilaterally Heart: regular rate and rhythm, S1, S2 normal, no murmur, click, rub or gallop Abdomen: morbidly obese Extremities: extremities normal, atraumatic, no cyanosis or edema Pulses: 2+ and symmetric Skin: Skin color, texture, turgor normal. No rashes or lesions Neurologic: Mental status: Alert, oriented, thought content appropriate Psych: Pleasant  EKG: Normal sinus rhythm at 97, left posterior fascicular block  ASSESSMENT: 1. Tachycardia 2. PAD with prior stent to the right leg in 1996 - normal ABIs 3. Asthma 4. Type 2 diabetes 5. Hypertension 6. Dyslipidemia 7. Obesity 8. Cervicalgia/dizziness  PLAN: 1.   Mr. Normoyle continues to have problems with neck pain and radiating pain down his arms and  anterior chest. This sharp chest pain episodes do not seem to be cardiac. He had recent negative stress testing. He continues to have problems with some loose stools and diarrhea diarrhea. Advised that he could consider trying Imodium for several days. He should contact his gastroenterologist that did his recent colonoscopy. I referred him to neurosurgery for evaluation of his neck pain. He reports that his MRI was denied after was ordered by his primary care provider. May be more helpful for him to be evaluated by specialist. Fortunately, he goes on Medicare in January and that may help some of his insurance coverage. Plan to check a lipid profile today. Follow-up in 6 months.  Pixie Casino, MD, [facility] Attending Cardiologist Tony C Dameion Briles 04/24/2016, 8:39 AM

## 2016-05-01 LAB — LIPID PANEL
Cholesterol: 121 mg/dL — ABNORMAL LOW (ref 125–200)
HDL: 39 mg/dL — ABNORMAL LOW (ref >=40)
LDL CALC: 56 mg/dL (ref <130)
TRIGLYCERIDES: 128 mg/dL (ref <150)
Total CHOL/HDL Ratio: 3.1 Ratio (ref <=5.0)
VLDL: 26 mg/dL (ref <30)

## 2016-05-04 ENCOUNTER — Encounter: Payer: Self-pay | Admitting: Internal Medicine

## 2016-05-15 ENCOUNTER — Telehealth: Payer: Self-pay

## 2016-05-15 NOTE — Telephone Encounter (Signed)
[facility] 05[facility]15/2016.

## 2016-05-19 ENCOUNTER — Other Ambulatory Visit: Payer: Self-pay | Admitting: Family Medicine

## 2016-05-19 DIAGNOSIS — E785 Hyperlipidemia, unspecified: Secondary | ICD-10-CM

## 2016-06-05 ENCOUNTER — Ambulatory Visit
Admission: RE | Admit: 2016-06-05 | Discharge: 2016-06-05 | Disposition: A | Discharge status: Home / Self Care | Payer: BLUE CROSS/BLUE SHIELD | Source: Ambulatory Visit | Attending: Family Medicine | Admitting: Family Medicine

## 2016-06-05 DIAGNOSIS — E785 Hyperlipidemia, unspecified: Secondary | ICD-10-CM

## 2016-07-01 ENCOUNTER — Other Ambulatory Visit: Payer: Self-pay | Admitting: Medical

## 2016-07-02 ENCOUNTER — Telehealth: Payer: Self-pay

## 2016-07-02 NOTE — Telephone Encounter (Signed)
Pt has switched to another practice and is no longer under Kristian CoveyShane Tysinger, PA care

## 2016-08-16 IMAGING — CT CT HEAD W/O CM
1 series · 16 of 30 positions shown, 20 images · non-contrast
Comparison: None.

CLINICAL DATA: Left parietal headache. Left leg numbness.
Hypertension.

EXAM:
CT HEAD WITHOUT CONTRAST
TECHNIQUE: Contiguous axial images were obtained from the base of the skull
through the vertex without intravenous contrast.

[Series 2: head 5.0 h30s · axial · 0.46mm/px · z∈[-109,+26]mm · 16 of 31 slices shown, 20 images]
[im 2/31  brain]
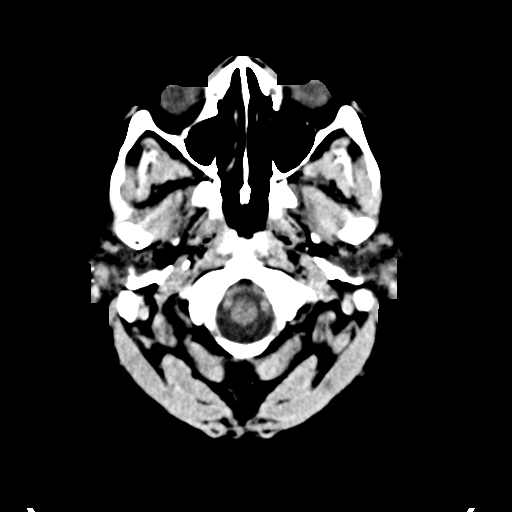
[im 2/31  bone]
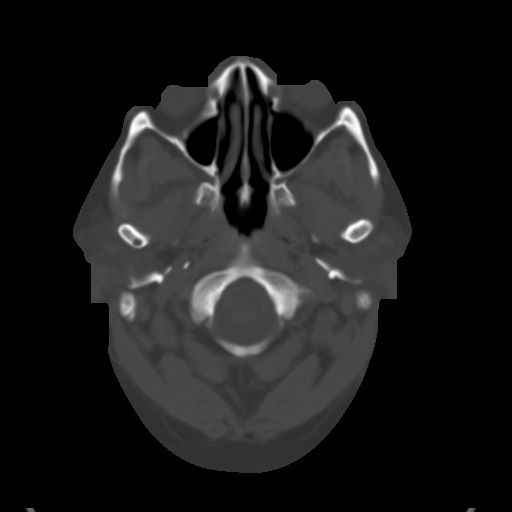
[im 4/31  brain]
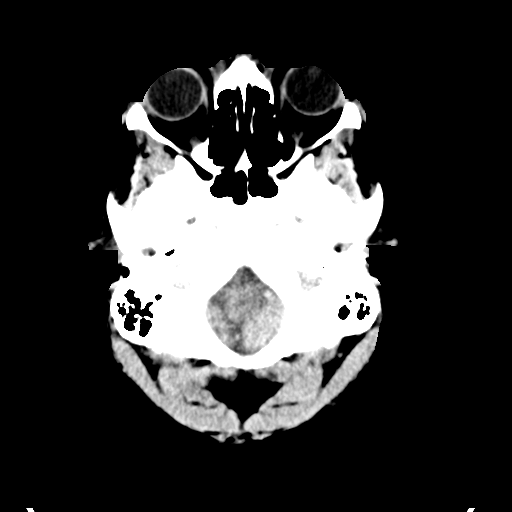
[im 6/31  brain]
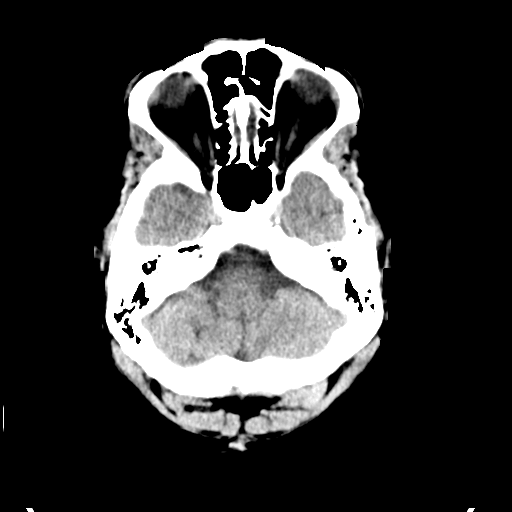
[im 8/31  brain]
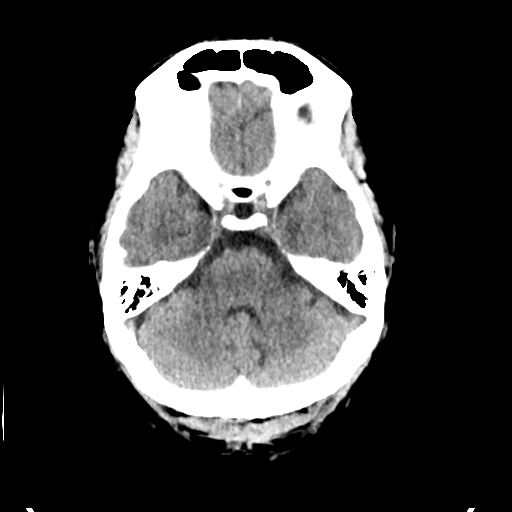
[im 9/31  brain]
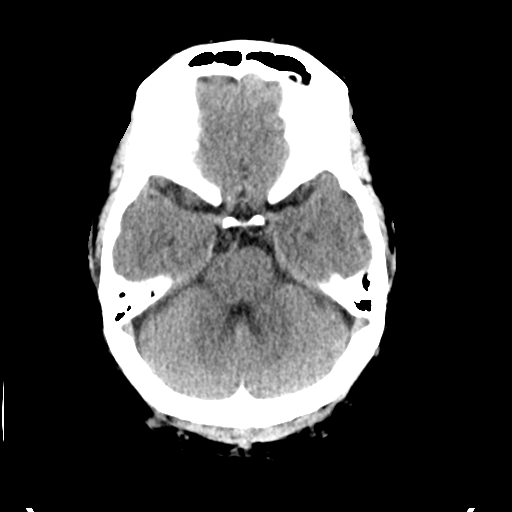
[im 9/31  bone]
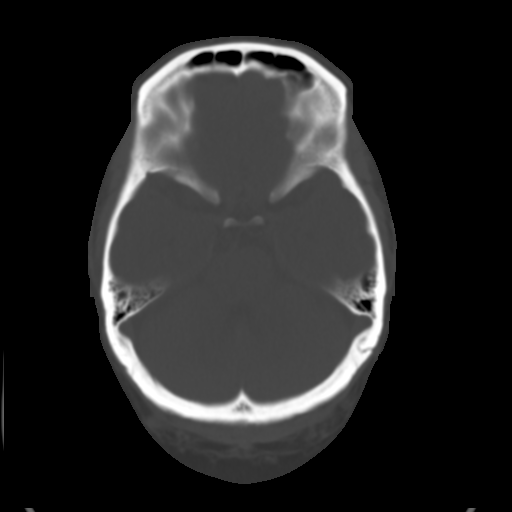
[im 11/31  brain]
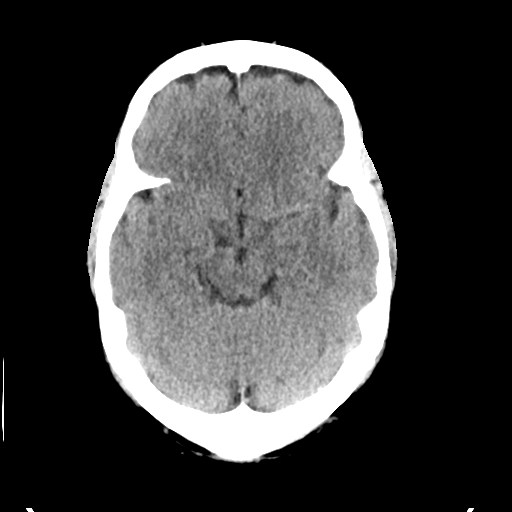
[im 13/31  brain]
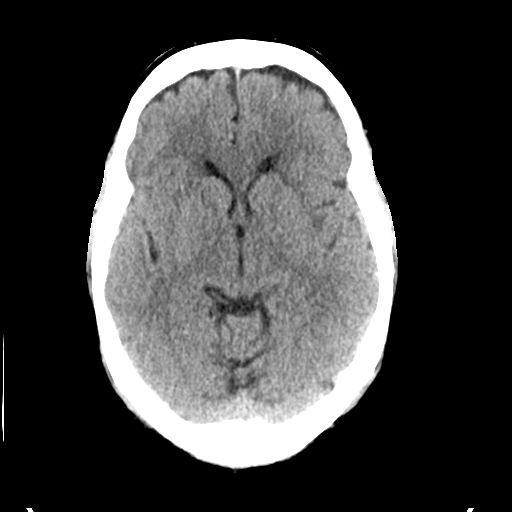
[im 15/31  brain]
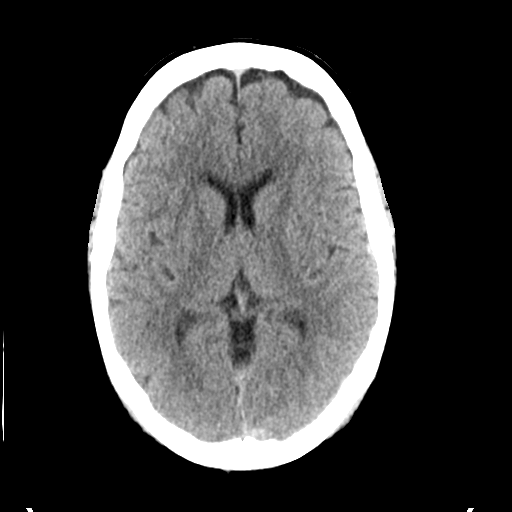
[im 16/31  brain]
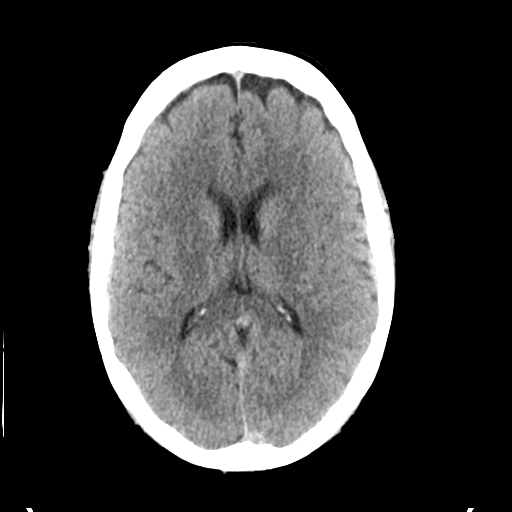
[im 16/31  bone]
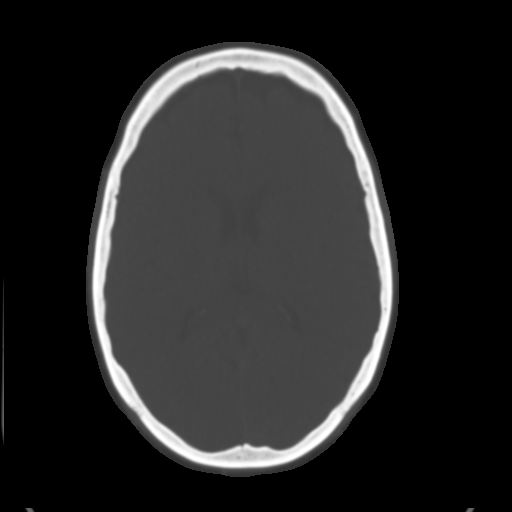
[im 18/31  brain]
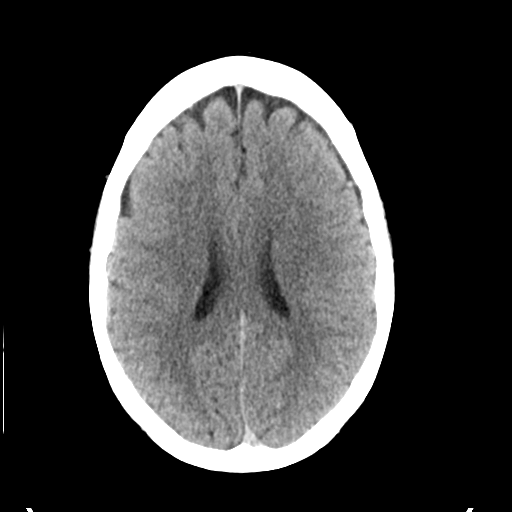
[im 20/31  brain]
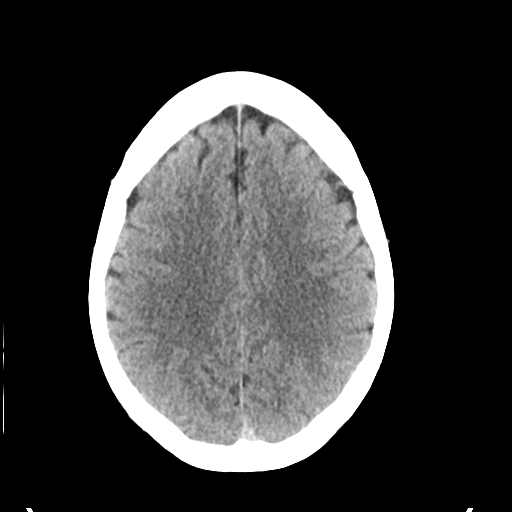
[im 22/31  brain]
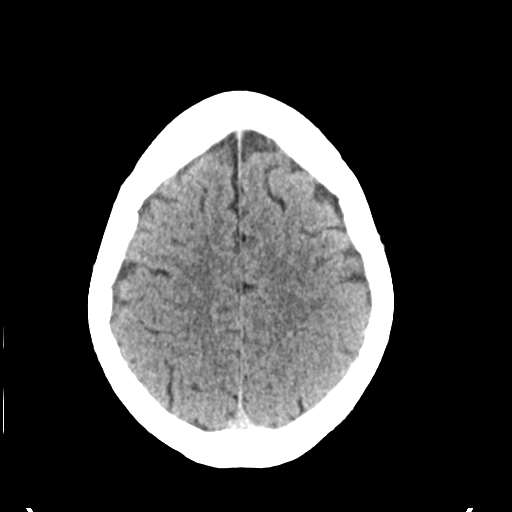
[im 23/31  brain]
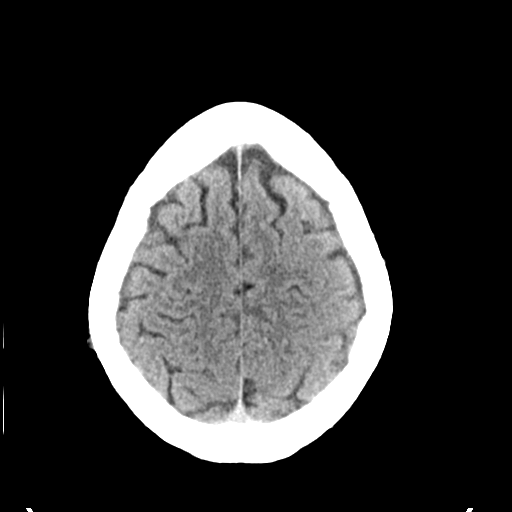
[im 23/31  bone]
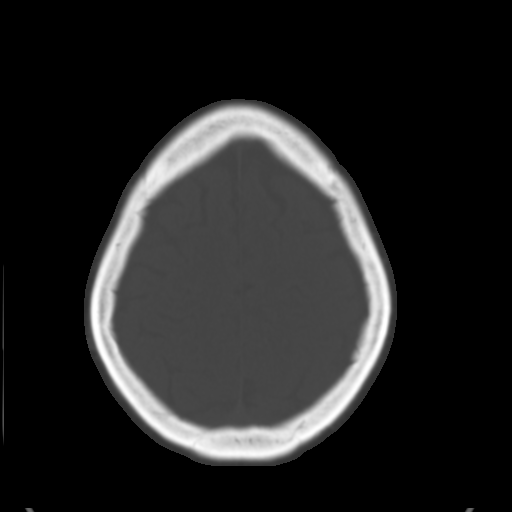
[im 25/31  brain]
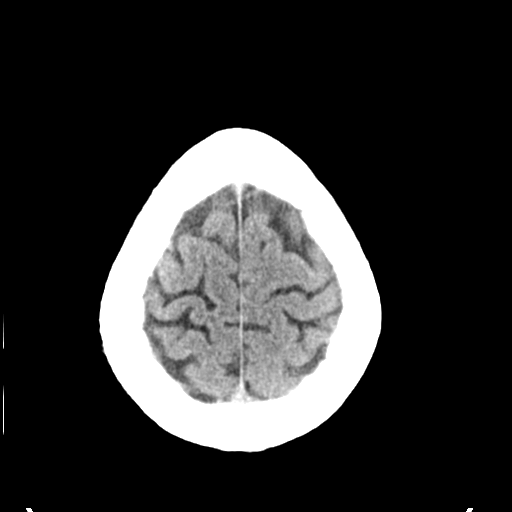
[im 27/31  brain]
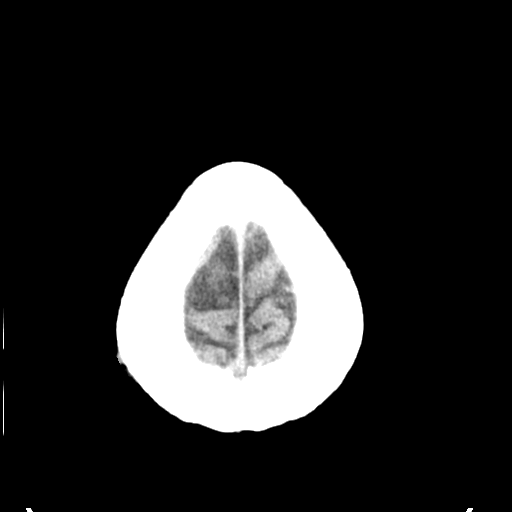
[im 29/31  brain]
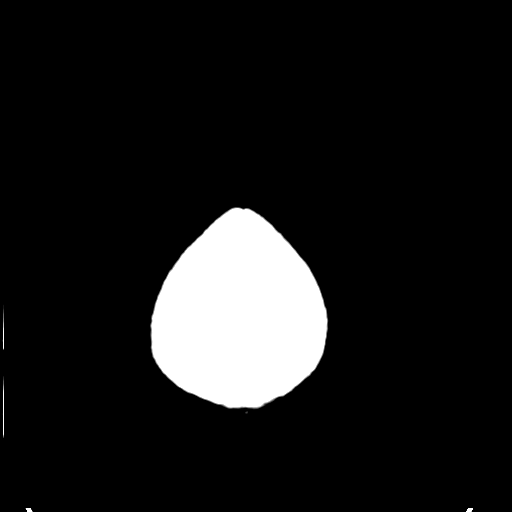

[16 of 30 positions shown; findings below may reference images not displayed]

FINDINGS: There is mild atrophy with diffuse sulcal prominence and symmetric
prominence of the bifrontal extra-axial spaces. The gray-white
differentiation is maintained. No CT evidence of acute large
territory infarct. No intraparenchymal or extra-axial mass or
hemorrhage. Normal size and configuration of the ventricles and
basilar cisterns. No midline shift. Limited visualization the
paranasal sinuses and mastoid air cells are normal. No for air-fluid
levels. Regional soft tissues are normal. No displaced calvarial
fracture.
IMPRESSION: Mild atrophy without acute intracranial process.

## 2016-08-16 IMAGING — CR DG CHEST 2V
2 series · 2 of 2 positions shown · non-contrast
Comparison: None.

CLINICAL DATA: Chest pain, shortness of breath, leg weakness.
History of hypertension and asthma.

EXAM:
CHEST  2 VIEW

[w chest pa]
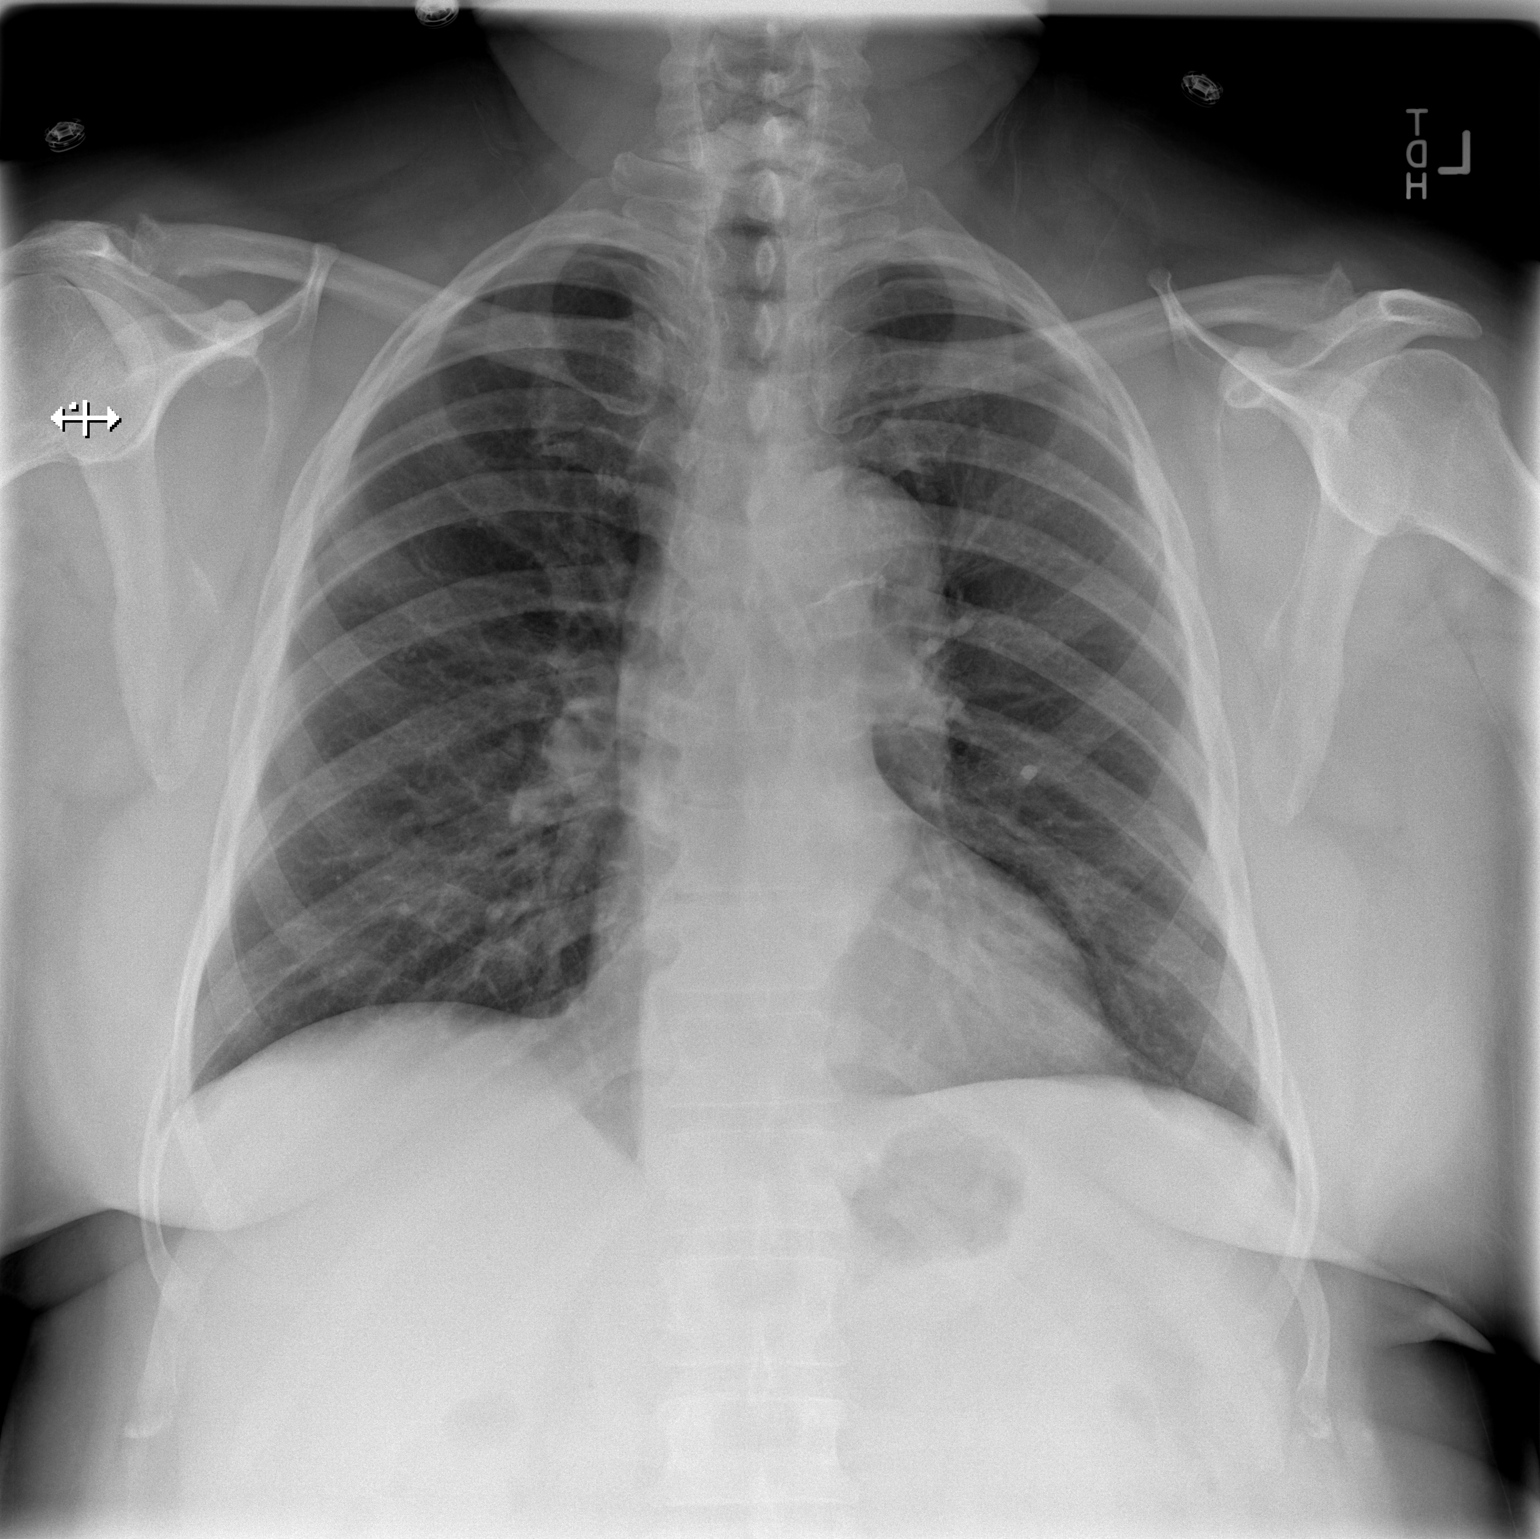

[w chest lat]
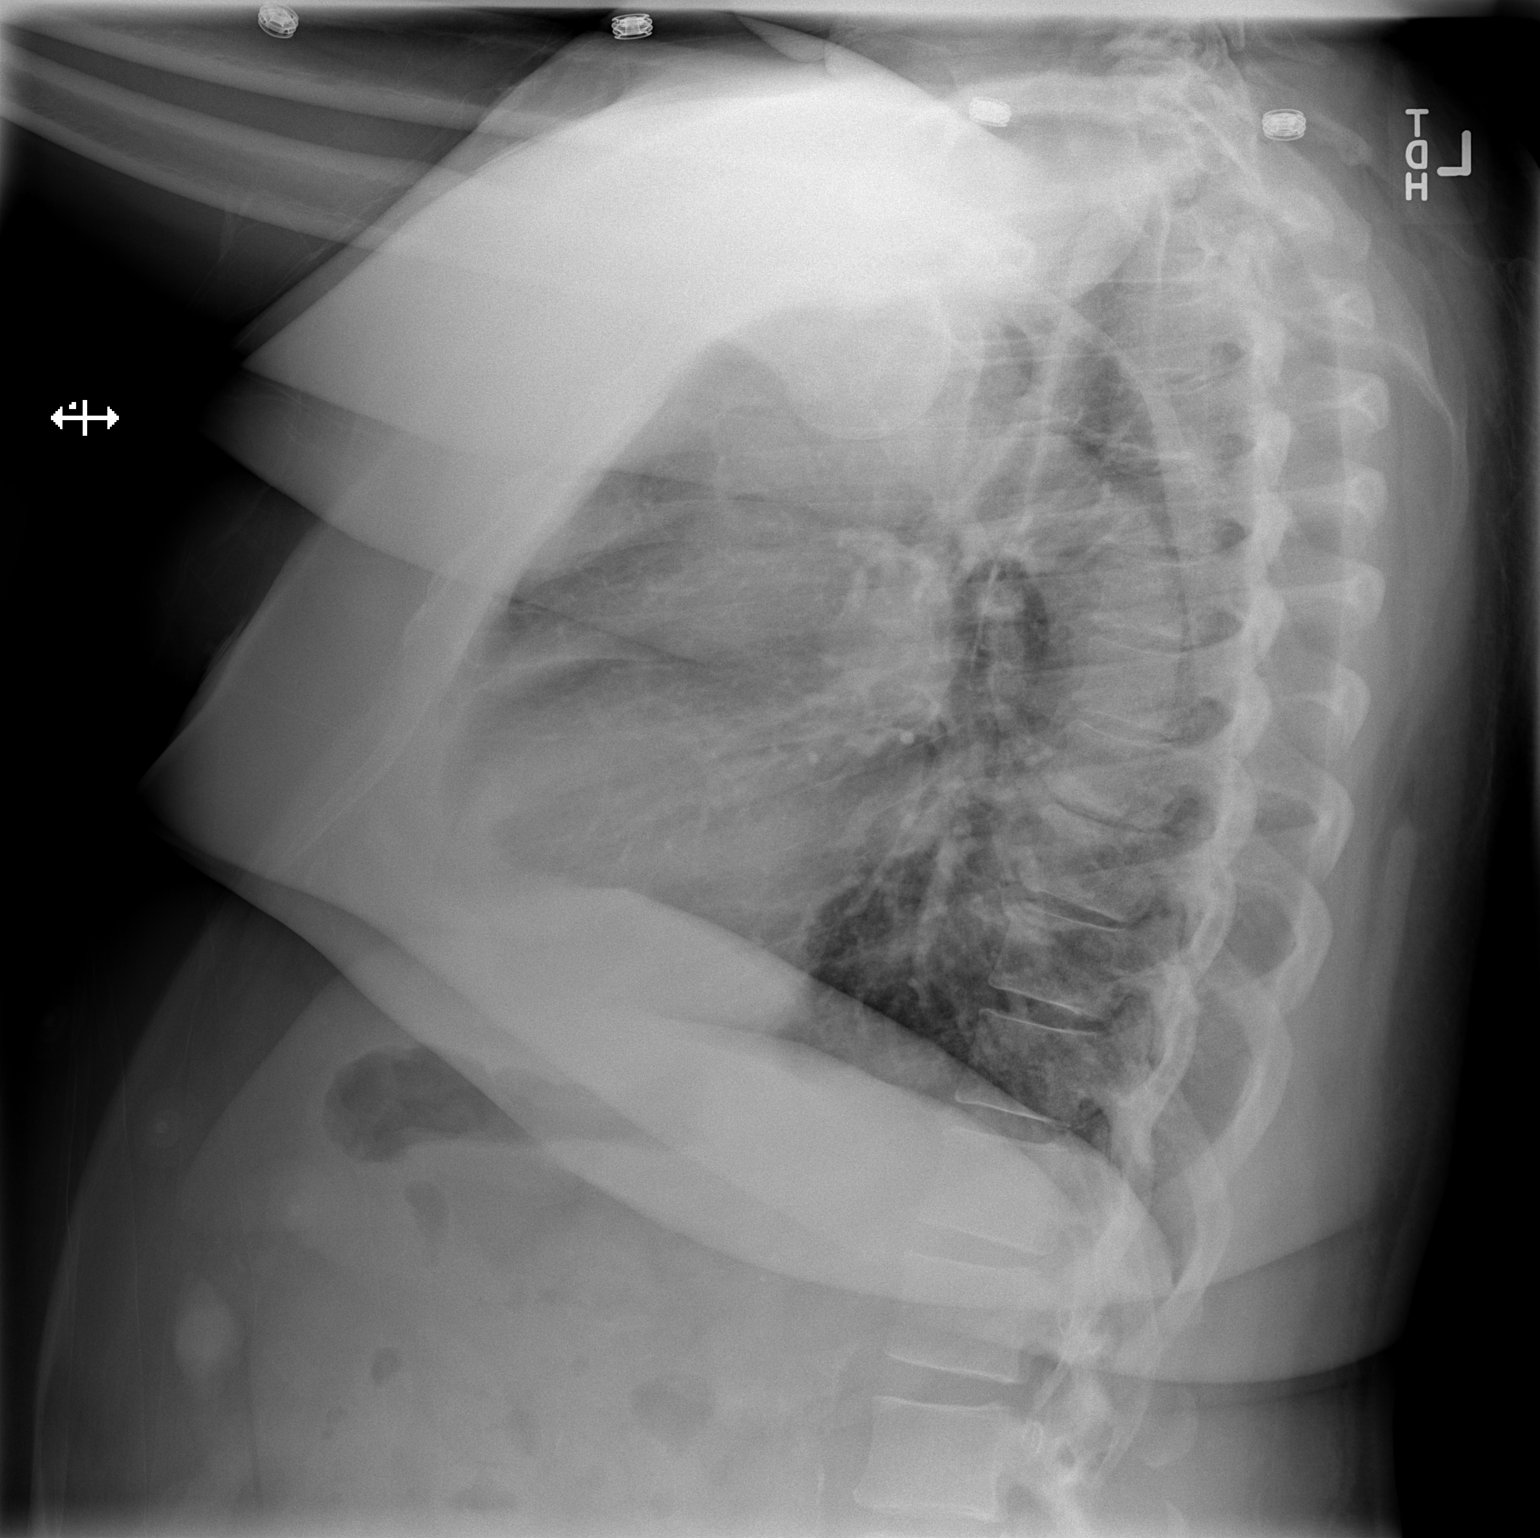

[2 of 2 positions shown; findings below may reference images not displayed]

FINDINGS: Normal cardiac silhouette. Atherosclerotic plaque within a tortuous
and possibly mildly ectatic thoracic aorta. Evaluation retrosternal
clear space is obscured secondary to overlying soft tissues. No
focal airspace opacities. No pleural effusion or pneumothorax. No
evidence of edema. No acute osseus abnormalities.
IMPRESSION: 1.  No acute cardiopulmonary disease.
2. Atherosclerotic plaque within a tortuous and possibly ectatic
thoracic aorta.

## 2016-08-19 ENCOUNTER — Other Ambulatory Visit: Payer: Self-pay | Admitting: Family

## 2016-08-19 DIAGNOSIS — M5442 Lumbago with sciatica, left side: Secondary | ICD-10-CM

## 2016-09-02 IMAGING — CR DG ANKLE COMPLETE 3+V*R*
3 series · 3 of 3 positions shown · non-contrast
Comparison: None.

CLINICAL DATA: Twisted ankle, pain, swelling laterally

EXAM:
RIGHT ANKLE - COMPLETE 3+ VIEW

[x ankle ap right]
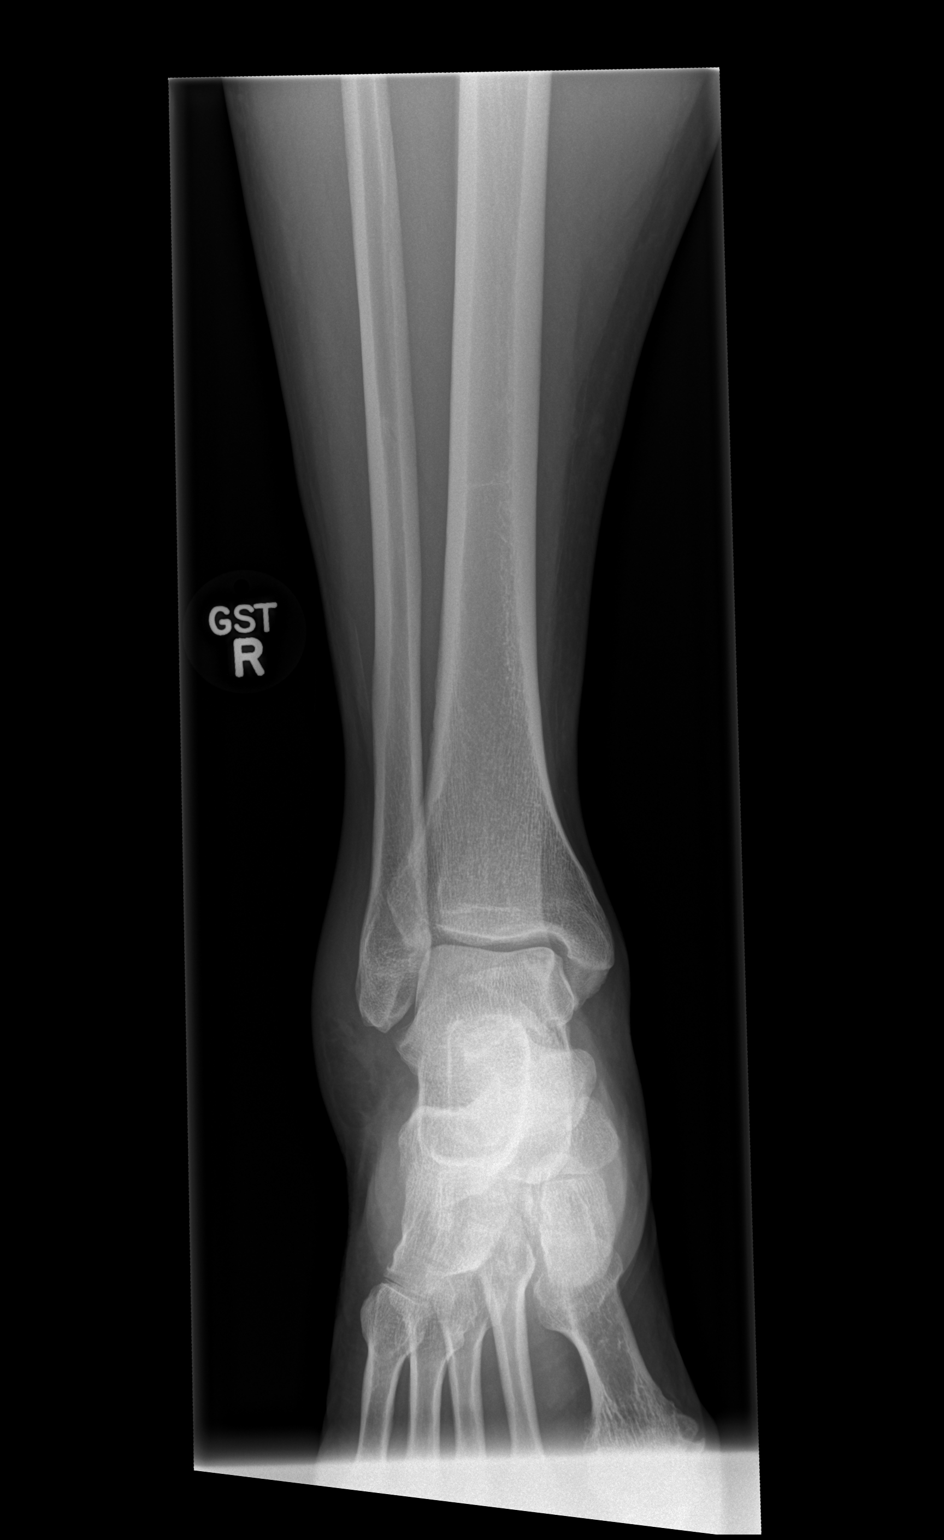

[x ankle obl right]
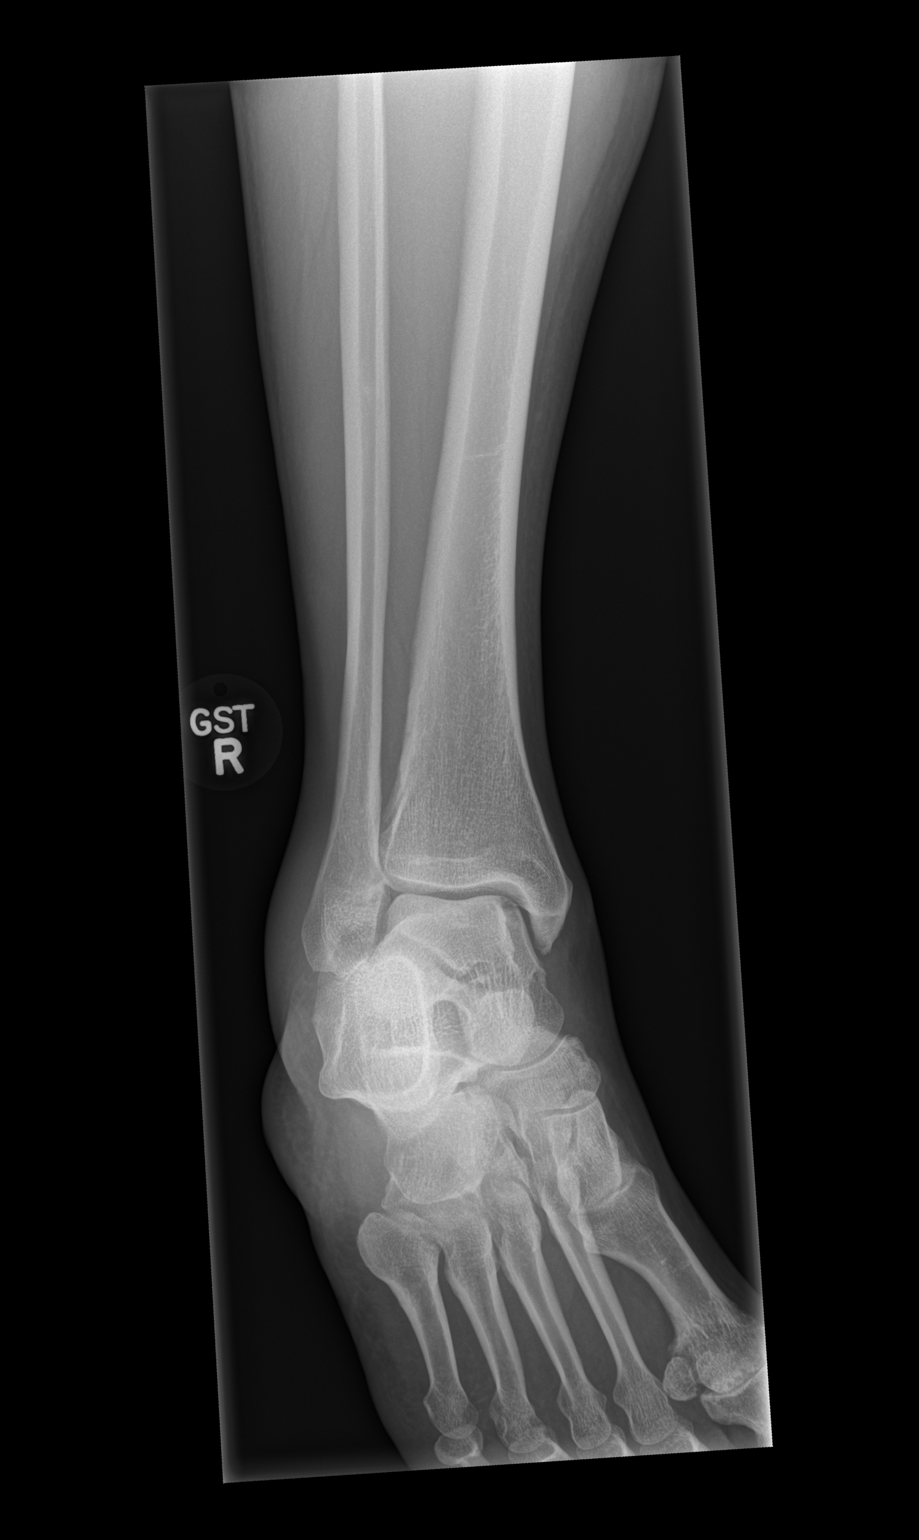

[x ankle lat right]
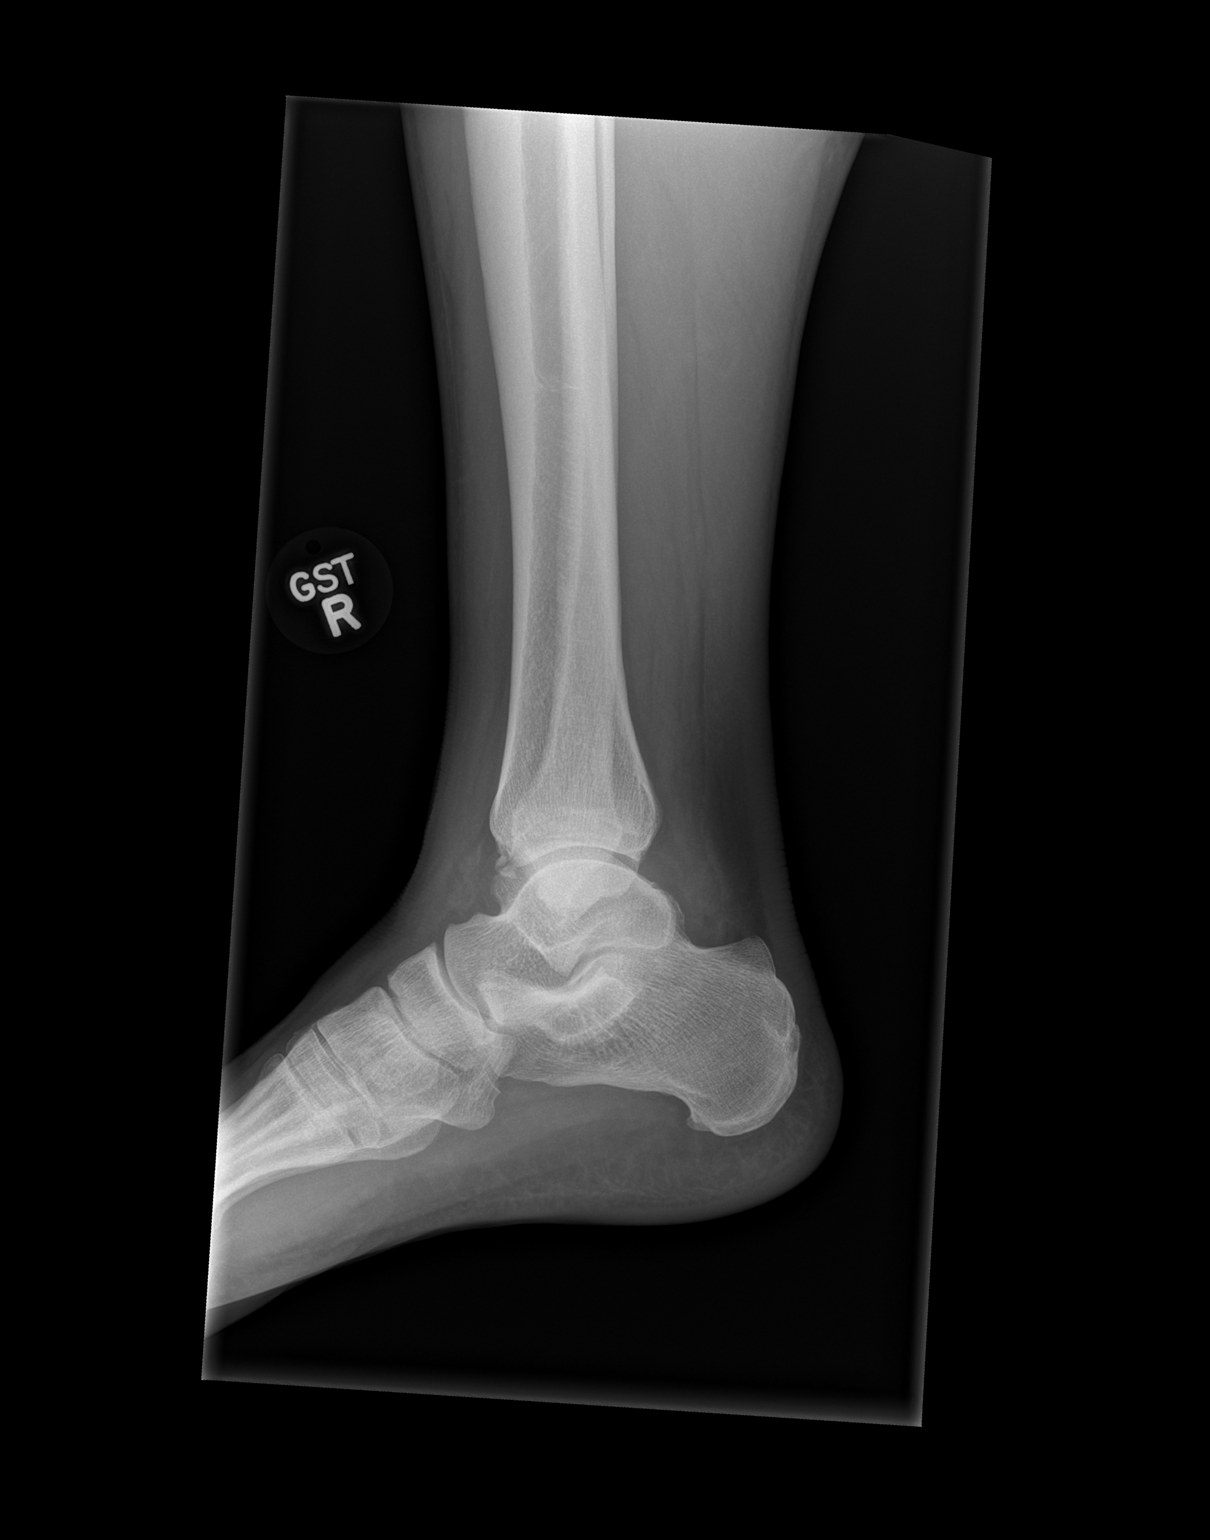

[3 of 3 positions shown; findings below may reference images not displayed]

FINDINGS: Moderate lateral soft tissue swelling. Small moderate joint
effusion. Mild anterior tibiotalar osteophyte formation. No fracture
or dislocation.
IMPRESSION: Ankle sprain

## 2016-09-04 ENCOUNTER — Other Ambulatory Visit: Payer: No Typology Code available for payment source

## 2016-11-06 LAB — VITAMIN B12: Vitamin B-12: 476

## 2016-12-25 ENCOUNTER — Other Ambulatory Visit: Payer: Self-pay | Admitting: Medical

## 2017-01-20 ENCOUNTER — Telehealth: Payer: Self-pay

## 2017-01-20 ENCOUNTER — Other Ambulatory Visit: Payer: Self-pay

## 2017-01-20 NOTE — Telephone Encounter (Signed)
Called and l/m for pt to call back about pick up meter and test strips from the office

## 2017-01-20 NOTE — Telephone Encounter (Signed)
Contour next strips are not covered by insurance. They need script for accuchek meter, test strip [facility] [address]

## 2017-01-21 NOTE — Telephone Encounter (Signed)
LMTC

## 2017-01-22 ENCOUNTER — Other Ambulatory Visit: Payer: Self-pay | Admitting: Medical

## 2017-01-22 NOTE — Telephone Encounter (Signed)
Called and l/m for patient to call us back about refills and meter.

## 2017-01-25 NOTE — Telephone Encounter (Signed)
Called an l/m for p/t  To call us back.

## 2017-01-26 ENCOUNTER — Other Ambulatory Visit: Payer: Self-pay | Admitting: Medical

## 2017-01-27 NOTE — Telephone Encounter (Signed)
L/m for pt call us back. 

## 2017-01-28 NOTE — Telephone Encounter (Signed)
I called left message for pt to call us back

## 2017-03-03 ENCOUNTER — Telehealth: Payer: Self-pay | Admitting: Internal Medicine

## 2017-03-03 NOTE — Telephone Encounter (Signed)
Received records from [facility] for appointment on 03/11/17 with Dr Hilty.  Records put with Dr Hilty's schedule for 03/11/17. lp °

## 2017-03-11 ENCOUNTER — Telehealth: Payer: Self-pay | Admitting: Medical

## 2017-03-11 ENCOUNTER — Ambulatory Visit (INDEPENDENT_AMBULATORY_CARE_PROVIDER_SITE_OTHER): Payer: Medicare PPO | Admitting: Internal Medicine

## 2017-03-11 ENCOUNTER — Encounter: Payer: Self-pay | Admitting: Internal Medicine

## 2017-03-11 VITALS — BP 154/84 | HR 93 | Ht 66.0 in | Wt 266.0 lb

## 2017-03-11 DIAGNOSIS — I10 Essential (primary) hypertension: Secondary | ICD-10-CM

## 2017-03-11 DIAGNOSIS — G4719 Other hypersomnia: Secondary | ICD-10-CM

## 2017-03-11 DIAGNOSIS — E785 Hyperlipidemia, unspecified: Secondary | ICD-10-CM | POA: Diagnosis not present

## 2017-03-11 NOTE — Telephone Encounter (Signed)
Please remove me as PCP.  I am still getting records from other specialists.

## 2017-03-11 NOTE — Telephone Encounter (Signed)
[facility]- this is no longer our pt. Trixie Rude/RLB

## 2017-03-11 NOTE — Patient Instructions (Signed)
Medication Instructions:  Your physician recommends that you continue on your current medications as directed. Please refer to the Current Medication list given to you today.  If you need a refill on your cardiac medications before your next appointment, please call your pharmacy.  Labwork: [facility] AS[facility]  Follow-Up: Your physician wants you to follow-up in: 6 months with DR HILT Thank you for choosing [facility]!!    Charnette Younkin, LPN

## 2017-03-11 NOTE — Addendum Note (Signed)
Addended by: Lindell SparELKINS, JENNA M on: 03/11/2017 02:53 PM   Modules accepted: Orders

## 2017-03-11 NOTE — Progress Notes (Addendum)
OFFICE NOTE  Chief Complaint:  Occasional palpitations  Primary Care Physician: Crisoforo Oxford, PA-C  HPI:  Charles [name] is a pleasant 66 year old male who is originally from new [place]. He is here for evaluation of progressive shortness of breath. He does have a history of asthma but recently is been having some cough and shortness of breath. He also has a history of diabetes on insulin, dyslipidemia, hypertension, and PAD. He tells me in 1996 he had a painful toe and was found to have rest pain in the office and was admitted for peripheral angiogram. He underwent a balloon angioplasty and ultimately required a stent per his report. We do not have those records available and we'll try to request them. He has not had follow-up since that time including no peripheral Dopplers. He's never had carotid Dopplers. He says in the past he's had a stress test but it's been many years ago. He denies any chest pain but is progressively more short of breath. He's also morbidly obese.  I saw Charles [name] back in the office today. He's recently been very anxious since he's had an episode of chest discomfort. This was after lifting a heavy couch. He says symptoms were sharp and intermittent during the first day and improved after a couple of days. What constant and did not sound anginal in nature. He did undergo nuclear stress test which was negative for ischemia with normal LV function. He had bilateral lower extremity ultrasounds which show patent vessels and no evidence of a stenosis of his previously placed peripheral stent. He also had bilateral carotid artery Dopplers which indicate mild carotid disease.  04/24/2016  Charles [name] returns today for follow-up. He's had some intermittent episodes of sharp chest pain. This is also associated with neck pain and some pain that radiates down his arms. He also reports some loose stool and diarrhea. This was worse on metformin however after  discontinuing he still has symptoms. He does get some dizziness which I suspect is related to neck pain. He denies any claudication. His Dopplers in August of last year showed normal ABIs and mild bilateral carotid disease. This is due for follow-up in August of next year. He is also overdue for a lipid profile.  03/11/2017  Charles [name] was seen today in follow-up. He reports some occasional palpitations. He denies any chest pain or worsening shortness of breath. His diarrhea that he had previously was resolved. He denies any pain with walking. Blood pressure was elevated today however he just recently took his medicines. A repeat blood pressure check came down to 134/80. 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 He is due for repeat lipid profile. He recently was put on oral blood sugar medication which caused him some shortness of breath and other symptoms however he stopped taking it and the symptoms resolved. He denies any claudication.  PMHx:  Past Medical History:  Diagnosis Date  . Abnormal carotid ultrasound 07/2015   mild bilateral stenosis, repeat in 1 year.  Dr. Debara Pickett  . Diabetes mellitus without complication ([place])   . Gout   . Hepatitis C antibody test positive 05/2015   prior infection, resolved  . History of cardiovascular stress test 9/16   Lexiscan, EF 55%, no ischemia, Dr. Debara Pickett  . Hyperlipidemia   . Hypertension   . Noncompliance   .  Obesity   . PVD (peripheral vascular disease) ([place]) 07/2015   normal ABIs, Dr. Debara Pickett    Past Surgical History:  Procedure Laterality Date  . BALLOON ANGIOPLASTY, ARTERY N/A also placed stent  . COLONOSCOPY  2016   Dr. Benson Norway    FAMHx:  Family History  Problem Relation Age of Onset  . Asthma Mother   . Heart attack Mother   . Hypertension Mother   . Hyperlipidemia Mother   . Hypertension Father   .  Diabetes Father   . HIV Sister   . Liver disease Brother   . Lung cancer Brother   . Hypertension Brother     SOCHx:   reports that he quit smoking about 21 years ago. His smoking use included Cigarettes. He has a 30.00 pack-year smoking history. He does not have any smokeless tobacco history on file. He reports that he does not drink alcohol or use drugs.  ALLERGIES:  Allergies  Allergen Reactions  . Penicillins Other (See Comments)    Per pt blacked out after injection as a child.     ROS: A comprehensive review of systems was negative.  HOME MEDS: Current Outpatient Prescriptions  Medication Sig Dispense Refill  . Albuterol Sulfate (PROAIR RESPICLICK) 297 (90 BASE) MCG/ACT AEPB Inhale 1 puff into the lungs every 6 (six) hours as needed. 1 each 1  . allopurinol (ZYLOPRIM) 300 MG tablet Take 1 tablet (300 mg total) by mouth daily. 90 tablet 3  . amLODipine (NORVASC) 10 MG tablet Take 1 tablet (10 mg total) by mouth daily. 90 tablet 3  . aspirin 81 MG chewable tablet Chew 1 tablet (81 mg total) by mouth daily. 90 tablet 3  . Blood Glucose Monitoring Suppl (BAYER CONTOUR NEXT MONITOR) w/Device KIT Test 2-3 times a day 1 kit 0  . glucose blood (BAYER CONTOUR NEXT TEST) test strip DX:E11.9 100 each 3  . losartan-hydrochlorothiazide (HYZAAR) 100-12.5 MG tablet Take 1 tablet by mouth daily. 90 tablet 3  . Multiple Vitamin (MULTIVITAMIN WITH MINERALS) TABS tablet Take 1 tablet by mouth daily.    Marland Kitchen omeprazole (PRILOSEC) 40 MG capsule Take 1 capsule (40 mg total) by mouth daily. 90 capsule 3  . ONETOUCH DELICA LANCETS 98X MISC   0  . rosuvastatin (CRESTOR) 10 MG tablet TAKE 1 TABLET EVERY DAY 90 tablet 0  . spironolactone (ALDACTONE) 25 MG tablet Take 1 tablet (25 mg total) by mouth daily. (Patient taking differently: Take 25 mg by mouth as needed. ) 90 tablet 3  . TOUJEO SOLOSTAR 300 UNIT/ML SOPN Inject 23 Units as directed daily.  1  . nitroGLYCERIN (NITROSTAT) 0.3 MG SL tablet Place  0.3 mg under the tongue every 5 (five) minutes as needed for chest pain. Reported on 02/27/2016     No current facility-administered medications for this visit.     LABS/IMAGING: No results found for this or any previous visit (from the past 48 hour(s)). No results found.  WEIGHTS: Wt Readings from Last 3 Encounters:  03/11/17 266 lb (120.7 kg)  04/24/16 265 lb (120.2 kg)  02/27/16 268 lb (121.6 kg)    VITALS: BP (!) 154/84   Pulse 93   Ht _0  (1.676 m)   Wt 266 lb (120.7 kg)   BMI 42.93 kg/m   EXAM: General appearance: alert, no distress and morbidly obese Neck: no carotid bruit and no JVD Lungs: clear to auscultation bilaterally Heart: regular rate and rhythm, S1, S2 normal, no murmur, click, rub or gallop Abdomen:  morbidly obese Extremities: extremities normal, atraumatic, no cyanosis or edema Pulses: 2+ and symmetric Skin: Skin color, texture, turgor normal. No rashes or lesions Neurologic: Mental status: Alert, oriented, thought content appropriate Psych: Pleasant  EKG: Normal sinus rhythm at 93  ASSESSMENT: 1. Tachycardia 2. PAD with prior stent to the right leg in 1996 - normal ABIs 3. Asthma 4. Type 2 diabetes 5. Hypertension 6. Dyslipidemia 7. Obesity 8. Cervicalgia/dizziness 9. Elevated EPWSS at 13  PLAN: 1.   Charles [name] denies any chest pain or worsening shortness of breath. Blood pressure seems to be at goal after recheck. He is due for repeat lipid profile which were ordered today. I also had him fill out a sleep questionnaire. 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 I did perform a sleep questionnaire today and he scored 13 indicating an increased risk of underlying sleep disorder. Based on this I recommend try to schedule him for a sleep study. Plan to see him back in 6 months with a goal  weight loss of 20-30 pounds.  Pixie Casino, MD, [facility] Attending Cardiologist [place] C Hilty 03/11/2017, 10:02 AM

## 2017-03-11 NOTE — Telephone Encounter (Signed)
He is way past due for diabetes f/u

## 2017-03-12 ENCOUNTER — Telehealth: Payer: Self-pay | Admitting: Internal Medicine

## 2017-03-12 NOTE — Telephone Encounter (Signed)
Patient called and notified that MD has ordered sleep study based on sleep questionnaire and symptoms per OV 3/22. Patient agrees and is aware a scheduler will contact him to arrange an appointment.

## 2017-04-01 ENCOUNTER — Other Ambulatory Visit: Payer: Self-pay | Admitting: Medical

## 2017-04-15 LAB — BASIC METABOLIC PANEL
BUN: 18 (ref 4–21)
Creatinine: 1.2 (ref 0.6–1.3)
GLUCOSE: 136
POTASSIUM: 4.1 (ref 3.4–5.3)
SODIUM: 138 (ref 137–147)

## 2017-04-15 LAB — HEMOGLOBIN A1C: Hemoglobin A1C: 7.9

## 2017-04-30 ENCOUNTER — Encounter (HOSPITAL_BASED_OUTPATIENT_CLINIC_OR_DEPARTMENT_OTHER): Payer: Medicare PPO

## 2017-05-01 ENCOUNTER — Other Ambulatory Visit: Payer: Self-pay | Admitting: Medical

## 2017-05-07 ENCOUNTER — Ambulatory Visit (INDEPENDENT_AMBULATORY_CARE_PROVIDER_SITE_OTHER): Payer: Medicare PPO | Admitting: Family Medicine

## 2017-05-07 ENCOUNTER — Encounter: Payer: Self-pay | Admitting: Family Medicine

## 2017-05-07 VITALS — BP 132/82 | HR 100 | Resp 12 | Ht 66.0 in | Wt 260.1 lb

## 2017-05-07 DIAGNOSIS — Z6841 Body Mass Index (BMI) 40.0 and over, adult: Secondary | ICD-10-CM

## 2017-05-07 DIAGNOSIS — H9191 Unspecified hearing loss, right ear: Secondary | ICD-10-CM | POA: Diagnosis not present

## 2017-05-07 DIAGNOSIS — M545 Low back pain, unspecified: Secondary | ICD-10-CM | POA: Insufficient documentation

## 2017-05-07 DIAGNOSIS — G8929 Other chronic pain: Secondary | ICD-10-CM | POA: Insufficient documentation

## 2017-05-07 DIAGNOSIS — I10 Essential (primary) hypertension: Secondary | ICD-10-CM | POA: Diagnosis not present

## 2017-05-07 DIAGNOSIS — IMO0001 Reserved for inherently not codable concepts without codable children: Secondary | ICD-10-CM

## 2017-05-07 DIAGNOSIS — E1165 Type 2 diabetes mellitus with hyperglycemia: Secondary | ICD-10-CM | POA: Diagnosis not present

## 2017-05-07 DIAGNOSIS — E669 Obesity, unspecified: Secondary | ICD-10-CM

## 2017-05-07 NOTE — Progress Notes (Signed)
HPI:   [name] is a 66 y.o. male, who is here today to establish care with me.  Former PCP: Ms Charles [name] at [employer], [place] Last preventive routine visit: 2017  Chronic medical problems: DM II (2003), HTN (1996), gout (last attack a while ago),GERD (on PPI for a year).  He sees cardiologists q 6 months.  Hypertension:   Dx 9-10 years ago. Currently on Hyzaar 100-12.5 mg daily and Amlodipine 10 mg daily.  + Hx of PAD.  He is taking medications as instructed, no side effects reported.  He has not noted unusual headache, visual changes, exertional chest pain, dyspnea,  focal weakness, or edema.   Lab Results  Component Value Date   CREATININE 1.27 (H) 02/27/2016   BUN 19 02/27/2016   NA 139 02/27/2016   K 4.7 02/27/2016   CL 102 02/27/2016   CO2 24 02/27/2016   He has not been consistent with a healthy diet and regular exercise. About 1-2 weeks ago he stopped eating bread, still getting pasta and rice frequently, he acknowledges problems with portion control. He does not exercise regularly , left foot callus.  He lives with his nephew.   Diabetes Mellitus II:   Dx about 6-7 years ago.  Currently on Metformin 500 mg bid.  Checking BS's : 160's-low 200's. Hypoglycemia: Denies. He has not ben able to afford other medications. He is tolerating Metformin well. He denies abdominal pain, nausea, vomiting, polydipsia, polyuria, or polyphagia. No numbness, tingling, or burning.  Last eye exam a year ago.   Concerns today:   -Right ear "clotted", 3-4 days, decreased hearing. For a while he has had intermittent hearing loss and alleviated by poping. Denies ear trauma,earache,or drainage.  He wears ear plugs when he takes a shower. Denies Hx of allergies.   Low back pain: She is reporting history of cervical and lower back pain, chronic lower back sharp/achy pain radiated to right hip for about 2 years, he denies numbness, tingling, saddle  anesthesia, urine/bowel incontinence.  Pain is exacerbated by prolonged walking and standing, alleviated by rest.  Pain is "bad" sometimes. She has not tried OTC medications.  Reports Hx of "spinal arthritis" He denies recent trauma.  Also complaining of feet pain, right is described as aching, usually at night when he is in bed, sometimes it keeps him from sleep. Left foot pain, plantar, exacerbated by walking, alleviated by rest. Attributed to callus on affected area.  According to patient, he stopped taking OTC ibuprofen because his "kidneys."   Review of Systems  Constitutional: Positive for fatigue. Negative for activity change, appetite change, fever and unexpected weight change.  HENT: Negative for mouth sores, nosebleeds, sore throat and trouble swallowing.   Eyes: Negative for redness and visual disturbance.  Respiratory: Negative for cough, shortness of breath and wheezing.   Cardiovascular: Negative for chest pain, palpitations and leg swelling.  Gastrointestinal: Negative for abdominal pain, nausea and vomiting.  Endocrine: Negative for polydipsia, polyphagia and polyuria.  Genitourinary: Negative for decreased urine volume, dysuria and hematuria.  Musculoskeletal: Positive for arthralgias and back pain. Negative for gait problem and joint swelling.  Skin: Negative for rash and wound.  Neurological: Negative for dizziness, syncope, weakness, numbness and headaches.  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 Negative for confusion.      Current Outpatient Prescriptions on File Prior to Visit  Medication Sig Dispense Refill  . allopurinol (ZYLOPRIM) 300 MG tablet  Take 1 tablet (300 mg total) by mouth daily. 90 tablet 3  . amLODipine (NORVASC) 10 MG tablet Take 1 tablet (10 mg total) by mouth daily. 90 tablet 3  . Blood Glucose Monitoring Suppl (BAYER CONTOUR NEXT MONITOR) w/Device KIT Test 2-3 times a day 1  kit 0  . glucose blood (BAYER CONTOUR NEXT TEST) test strip DX:E11.9 100 each 3  . losartan-hydrochlorothiazide (HYZAAR) 100-12.5 MG tablet Take 1 tablet by mouth daily. 90 tablet 3  . omeprazole (PRILOSEC) 40 MG capsule Take 1 capsule (40 mg total) by mouth daily. 90 capsule 3  . ONETOUCH DELICA LANCETS 31D MISC   0  . rosuvastatin (CRESTOR) 10 MG tablet TAKE 1 TABLET EVERY DAY 90 tablet 0   No current facility-administered medications on file prior to visit.      Past Medical History:  Diagnosis Date  . Abnormal carotid ultrasound 07/2015   mild bilateral stenosis, repeat in 1 year.  Dr. Debara [name]  . Diabetes mellitus without complication ([place])   . Gout   . Hepatitis C antibody test positive 05/2015   prior infection, resolved  . History of cardiovascular stress test 9/16   Lexiscan, EF 55%, no ischemia, Dr. Debara [name]  . Hyperlipidemia   . Hypertension   . Noncompliance   . Obesity   . PVD (peripheral vascular disease) ([place]) 07/2015   normal ABIs, Dr. Debara [name]   Allergies  Allergen Reactions  . Penicillins Other (See Comments)    Per pt blacked out after injection as a child.     Family History  Problem Relation Age of Onset  . Asthma Mother   . Heart attack Mother   . Hypertension Mother   . Hyperlipidemia Mother   . Hypertension Father   . Diabetes Father   . HIV Sister   . Liver disease Brother   . Lung cancer Brother   . Hypertension Brother     Social History   Social History  . Marital status: Divorced    Spouse name: N/A  . Number of children: N/A  . Years of education: N/A   Social History Main Topics  . Smoking status: Former Smoker    Packs/day: 1.00    Years: 30.00    Types: Cigarettes    Quit date: 07/31/1995  . Smokeless tobacco: Never Used  . Alcohol use No  . Drug use: No  . Sexual activity: Not Asked   Other Topics Concern  . None   Social History Narrative  . None    Vitals:   05/07/17 1119  BP: 132/82  Pulse: 100  Resp: 12  O2 sat at  RA 96%. Body mass index is 41.99 kg/m.   Physical Exam  Nursing note and vitals reviewed. Constitutional: He is oriented to person, place, and time. He appears well-developed. No distress.  HENT:  Head: Atraumatic.  Right Ear: Tympanic membrane and external ear normal.  Left Ear: External ear normal.  Mouth/Throat: Oropharynx is clear and moist and mucous membranes are normal. Abnormal dentition.  Cerumen excess bilateral, right impaction. L TM seen partially.   Eyes: Conjunctivae and EOM are normal. Pupils are equal, round, and reactive to light.  Neck: No thyroid mass and no thyromegaly present.  Cardiovascular: Normal rate and regular rhythm.   No murmur heard. Pulses:      Dorsalis pedis pulses are 2+ on the right side, and 2+ on the left side.  Respiratory: Effort normal and breath sounds normal. No respiratory distress.  GI: Soft.  He exhibits no mass. There is no hepatomegaly. There is no tenderness.  Musculoskeletal: He exhibits no edema.       Lumbar back: He exhibits no tenderness and no bony tenderness.  Left lower back with left hip ROM. Mild knee crepitus bilateral.   Lymphadenopathy:    He has no cervical adenopathy.  Neurological: He is alert and oriented to person, place, and time. He has normal strength. Gait normal.  SLR negative bilateral.  Skin: Skin is warm. No erythema.  Psychiatric: His mood appears anxious. Cognition and memory are normal.  Poor groomed, good eye contact.    Diabetic foot exam:  Monofilament normal bilateral. Peripheral pulses present (DP). + Calluses + Hypertrophic/long toenails.   ASSESSMENT AND PLAN:    Shizuo was seen today for establish care.  Diagnoses and all orders for this visit:  Uncontrolled type 2 diabetes mellitus with hyperglycemia, without long-term current use of insulin (HCC)  HgA1C at above goal. No changes in current management, he is due for HgA1C 3-4 months. Regular exercise and healthy diet with  avoidance of added sugar food intake is an important part of treatment and recommended. Annual eye exam, periodic dental and foot care recommended. F/U in 4 months  Essential hypertension  Adequately controlled. No changes in current management. DASH-low salt diet recommended. Eye exam recommended annually. F/U in 4 months, before if needed.  Chronic left-sided low back pain, with sciatica presence unspecified  Wt loss may help. OTC Tylenol 500 mg tid. Local heat. OTC Lidocaine path or topical icy hot with Lidocaine.  Class 3 obesity with serious comorbidity and body mass index (BMI) of 40.0 to 44.9 in adult, unspecified obesity type ([place])  We discussed benefits of wt loss as well as adverse effects of obesity. Consistency with healthy diet and physical activity recommended. Daily brisk walking for 15-30 min as tolerated.  Hearing loss of right ear, unspecified hearing loss type  ? Conductive: cerumen excess bilateral R>L. I do not recommend ear lavage. Recommend OTC products,Debrox.    Face to face :11-34 am- 12:16 pm  > 50% was dedicated to discussion of Dx,prognosis,complications if not well controlled, as well as some side effects of medications. We focus also on counseling about healthy diet and life style changes.      Paizlee Kinder G. [place], MD  [facility] [place]. [place] office.

## 2017-05-07 NOTE — Patient Instructions (Signed)
A few things to remember from today's visit:   Uncontrolled type 2 diabetes mellitus with hyperglycemia, without long-term current use of insulin (HCC)  Essential hypertension  Chronic left-sided low back pain, with sciatica presence unspecified  Class 3 obesity with serious comorbidity and body mass index (BMI) of 40.0 to 44.9 in adult, unspecified obesity type (HCC)  HgA1C goal < 7.0. Avoid sugar added food:regular soft drinks, energy drinks, and sports drinks. candy. cakes. cookies. pies and cobblers. sweet rolls, pastries, and donuts. fruit drinks, such as fruitades and fruit punch. dairy desserts, such as ice cream  Mediterranean diet has showed benefits for sugar control.  How much and what type of carbohydrate foods are important for managing diabetes. The balance between how much insulin is in your body and the carbohydrate you eat makes a difference in your blood glucose levels.  Fasting blood sugar ideally 130 or less, 2 hours after meals less than 180.   Regular exercise also will help with controlling disease, daily brisk walking as tolerated for 15-30 min definitively will help.   Podiatry and optometrist referrals are not needed.    Avoid skipping meals, blood sugar might drop and cause serious problems. Remember checking feet periodically, good dental hygiene, and annual eye exam.     Please be sure medication list is accurate. If a new problem present, please set up appointment sooner than planned today.

## 2017-05-10 ENCOUNTER — Other Ambulatory Visit: Payer: Self-pay | Admitting: Medical

## 2017-05-20 ENCOUNTER — Encounter: Payer: Self-pay | Admitting: Endocrinology

## 2017-05-20 ENCOUNTER — Ambulatory Visit (INDEPENDENT_AMBULATORY_CARE_PROVIDER_SITE_OTHER): Payer: Medicare PPO | Admitting: Endocrinology

## 2017-05-20 VITALS — BP 136/84 | HR 95 | Ht 66.0 in | Wt 260.0 lb

## 2017-05-20 DIAGNOSIS — E1165 Type 2 diabetes mellitus with hyperglycemia: Secondary | ICD-10-CM

## 2017-05-20 LAB — POCT GLYCOSYLATED HEMOGLOBIN (HGB A1C): Hemoglobin A1C: 7.5

## 2017-05-20 MED ORDER — BROMOCRIPTINE MESYLATE 2.5 MG PO TABS: ORAL_TABLET | ORAL | 11 refills | Status: DC

## 2017-05-20 NOTE — Patient Instructions (Addendum)
good diet and exercise significantly improve the control of your diabetes.  please let me know if you wish to be referred to a dietician.  high blood sugar is very risky to your health.  you should see an eye doctor and dentist every year.  It is very important to get all recommended vaccinations.  Controlling your blood pressure and cholesterol drastically reduces the damage diabetes does to your body.  Those who smoke should quit.  Please discuss these with your doctor.  check your blood sugar once a day.  vary the time of day when you check, between before the 3 meals, and at bedtime.  also check if you have symptoms of your blood sugar being too high or too low.  please keep a record of the readings and bring it to your next appointment here (or you can bring the meter itself).  You can write it on any piece of paper.  please call us sooner if your blood sugar goes below 70, or if you have a lot of readings over 200.   Please continue the same metformin, and: I have sent a prescription to your pharmacy, to add "bromocriptine," to help your blood sugar.  Please come back for a follow-up appointment in 3 months.

## 2017-05-20 NOTE — Progress Notes (Signed)
Subjective:    Patient ID: Charles [name], male    DOB: [birth year], 66 y.o.   MRN: [id number]  HPI pt is referred by Dr Charles [name], for diabetes.  Pt states DM was dx'ed in 2003; he has mild if any neuropathy of the lower extremities; he has associated PAD; he has never been on insulin; pt says his diet and exercise are poor; he has never had pancreatitis, pancreatic surgery, severe hypoglycemia or DKA.  He takes metformin as rx'ed.  He requests generic meds only.   Past Medical History:  Diagnosis Date  . Abnormal carotid ultrasound 07/2015   mild bilateral stenosis, repeat in 1 year.  Dr. Debara [name]  . Diabetes mellitus without complication (Charles [name])   . Gout   . Hepatitis C antibody test positive 05/2015   prior infection, resolved  . History of cardiovascular stress test 9/16   Lexiscan, EF 55%, no ischemia, Dr. Debara [name]  . Hyperlipidemia   . Hypertension   . Noncompliance   . Obesity   . PVD (peripheral vascular disease) ([place]) 07/2015   normal ABIs, Dr. Debara [name]    Past Surgical History:  Procedure Laterality Date  . BALLOON ANGIOPLASTY, ARTERY N/A also placed stent  . COLONOSCOPY  2016   Dr. Benson [name]    Social History   Social History  . Marital status: Divorced    Spouse name: N/A  . Number of children: N/A  . Years of education: N/A   Occupational History  . Not on file.   Social History Main Topics  . Smoking status: Former Smoker    Packs/day: 1.00    Years: 30.00    Types: Cigarettes    Quit date: 07/31/1995  . Smokeless tobacco: Never Used  . Alcohol use No  . Drug use: No  . Sexual activity: Not on file   Other Topics Concern  . Not on file   Social History Narrative  . No narrative on file    Current Outpatient Prescriptions on File Prior to Visit  Medication Sig Dispense Refill  . allopurinol (ZYLOPRIM) 300 MG tablet Take 1 tablet (300 mg total) by mouth daily. 90 tablet 3  . amLODipine (NORVASC) 10 MG tablet Take 1 tablet (10 mg total) by mouth daily. 90 tablet  3  . Blood Glucose Monitoring Suppl (BAYER CONTOUR NEXT MONITOR) w/Device KIT Test 2-3 times a day 1 kit 0  . glucose blood (BAYER CONTOUR NEXT TEST) test strip DX:E11.9 100 each 3  . losartan-hydrochlorothiazide (HYZAAR) 100-12.5 MG tablet Take 1 tablet by mouth daily. 90 tablet 3  . ONETOUCH DELICA LANCETS 32N MISC   0  . rosuvastatin (CRESTOR) 10 MG tablet TAKE 1 TABLET EVERY DAY 90 tablet 0   No current facility-administered medications on file prior to visit.     Allergies  Allergen Reactions  . Penicillins Other (See Comments)    Per pt blacked out after injection as a child.     Family History  Problem Relation Age of Onset  . Asthma Mother   . Heart attack Mother   . Hypertension Mother   . Hyperlipidemia Mother   . Hypertension Father   . Diabetes Father   . HIV Sister   . Liver disease Brother   . Lung cancer Brother   . Hypertension Brother     BP 136/84   Pulse 95   Ht '5\' 6"'  (1.676 m)   Wt 260 lb (117.9 kg)   SpO2 98%   BMI 41.97 kg/m  Review of Systems denies blurry vision, headache, chest pain, sob, n/v, muscle cramps, excessive diaphoresis, memory loss, cold intolerance, rhinorrhea, and easy bruising.  He lost 100 lbs after dx, but has since regained.  He has nocturia.       Objective:   Physical Exam VS: see vs page GEN: no distress HEAD: head: no deformity eyes: no periorbital swelling, no proptosis external nose and ears are normal mouth: no lesion seen NECK: supple, thyroid is not enlarged CHEST WALL: no deformity LUNGS: clear to auscultation CV: reg rate and rhythm, no murmur ABD: abdomen is soft, nontender.  no hepatosplenomegaly.  not distended.  no hernia MUSCULOSKELETAL: muscle bulk and strength are grossly normal.  no obvious joint swelling.  gait is normal and steady EXTEMITIES: no deformity.  no ulcer on the feet.  feet are of normal color and temp.  1+ bilat leg edema PULSES: dorsalis pedis intact bilat.  no carotid bruit NEURO:   cn 2-12 grossly intact.   readily moves all 4's.  sensation is intact to touch on the feet.   SKIN:  Normal texture and temperature.  No rash or suspicious lesion is visible.  Heavy calluses on the feet.   NODES:  None palpable at the neck.   PSYCH: alert, well-oriented.  Does not appear anxious nor depressed.     A1c=7.5%  I personally reviewed electrocardiogram tracing (03/11/17): Indication: dyslipidemia Impression: NSR.  No MI.  No hypertrophy. Compared to 2017: LPFB has resolved     Assessment & Plan:  Type 2 DM, with PAD: he needs increased rx, if it can be done with a regimen that avoids or minimizes hypoglycemia.  Edema: this limits rx options.  Obesity: new to me: he declines surgery.    Patient Instructions  good diet and exercise significantly improve the control of your diabetes.  please let me know if you wish to be referred to a dietician.  high blood sugar is very risky to your health.  you should see an eye doctor and dentist every year.  It is very important to get all recommended vaccinations.  Controlling your blood pressure and cholesterol drastically reduces the damage diabetes does to your body.  Those who smoke should quit.  Please discuss these with your doctor.  check your blood sugar once a day.  vary the time of day when you check, between before the 3 meals, and at bedtime.  also check if you have symptoms of your blood sugar being too high or too low.  please keep a record of the readings and bring it to your next appointment here (or you can bring the meter itself).  You can write it on any piece of paper.  please call us sooner if your blood sugar goes below 70, or if you have a lot of readings over 200.   Please continue the same metformin, and: I have sent a prescription to your pharmacy, to add "bromocriptine," to help your blood sugar.  Please come back for a follow-up appointment in 3 months.

## 2017-05-27 ENCOUNTER — Other Ambulatory Visit: Payer: Self-pay | Admitting: Anesthesiology

## 2017-05-27 DIAGNOSIS — M5090 Cervical disc disorder, unspecified, unspecified cervical region: Secondary | ICD-10-CM

## 2017-05-28 LAB — COLOGUARD

## 2017-05-31 ENCOUNTER — Encounter: Payer: Self-pay | Admitting: Family Medicine

## 2017-06-10 ENCOUNTER — Ambulatory Visit
Admission: RE | Admit: 2017-06-10 | Discharge: 2017-06-10 | Disposition: A | Discharge status: Home / Self Care | Payer: Medicare PPO | Source: Ambulatory Visit | Attending: Anesthesiology | Admitting: Anesthesiology

## 2017-06-10 DIAGNOSIS — M5090 Cervical disc disorder, unspecified, unspecified cervical region: Secondary | ICD-10-CM

## 2017-06-22 ENCOUNTER — Other Ambulatory Visit: Payer: Self-pay | Admitting: Family Medicine

## 2017-06-22 MED ORDER — AMLODIPINE BESYLATE 10 MG PO TABS: 10.0000 mg | ORAL_TABLET | Freq: Every day | ORAL | 1 refills | Status: DC

## 2017-06-22 MED ORDER — LOSARTAN POTASSIUM-HCTZ 100-12.5 MG PO TABS: 1.0000 | ORAL_TABLET | Freq: Every day | ORAL | 1 refills | Status: DC

## 2017-06-22 MED ORDER — ALLOPURINOL 300 MG PO TABS: 300.0000 mg | ORAL_TABLET | Freq: Every day | ORAL | 1 refills | Status: DC

## 2017-06-24 ENCOUNTER — Other Ambulatory Visit: Payer: Self-pay | Admitting: Family Medicine

## 2017-06-24 MED ORDER — ACCU-CHEK SMARTVIEW CONTROL VI LIQD: 1.0000 | Freq: Once | 0 refills | Status: AC

## 2017-06-24 MED ORDER — ACCU-CHEK NANO SMARTVIEW W/DEVICE KIT: 1.0000 | PACK | Freq: Once | 0 refills | Status: AC

## 2017-06-24 MED ORDER — BD SWAB SINGLE USE REGULAR PADS: 1.0000 | MEDICATED_PAD | Freq: Every day | 3 refills | Status: DC

## 2017-06-24 MED ORDER — GLUCOSE BLOOD VI STRP: ORAL_STRIP | 12 refills | Status: DC

## 2017-06-24 MED ORDER — ROSUVASTATIN CALCIUM 10 MG PO TABS: 10.0000 mg | ORAL_TABLET | Freq: Every day | ORAL | 0 refills | Status: DC

## 2017-06-24 MED ORDER — ACCU-CHEK FASTCLIX LANCET KIT: 1.0000 | PACK | Freq: Once | 0 refills | Status: AC

## 2017-06-24 MED ORDER — METFORMIN HCL 500 MG PO TABS: 500.0000 mg | ORAL_TABLET | Freq: Two times a day (BID) | ORAL | 3 refills | Status: DC

## 2017-07-01 DIAGNOSIS — L84 Corns and callosities: Secondary | ICD-10-CM | POA: Diagnosis not present

## 2017-07-01 DIAGNOSIS — E119 Type 2 diabetes mellitus without complications: Secondary | ICD-10-CM | POA: Diagnosis not present

## 2017-07-01 DIAGNOSIS — L853 Xerosis cutis: Secondary | ICD-10-CM | POA: Diagnosis not present

## 2017-07-06 ENCOUNTER — Encounter (HOSPITAL_BASED_OUTPATIENT_CLINIC_OR_DEPARTMENT_OTHER): Payer: Medicare PPO

## 2017-07-06 MED ORDER — OMEPRAZOLE 40 MG PO CPDR: 40.0000 mg | DELAYED_RELEASE_CAPSULE | Freq: Every day | ORAL | 2 refills | Status: DC

## 2017-07-14 ENCOUNTER — Other Ambulatory Visit: Payer: Self-pay

## 2017-08-12 NOTE — Progress Notes (Signed)
[name] is a 66 y.o.male, who is here today to follow on HTN and some other chronic medical problems. He was last seen on 05/07/17.  Currently he is on Hyzaar 100-12.5 mg daily and Norvasc 10 mg daily. Marland Kitchen He is taking medications as instructed, no side effects reported.  He has not noted unusual headache, visual changes, exertional chest pain, dyspnea,  focal weakness, or edema.    Lab Results  Component Value Date   CREATININE 1.2 04/15/2017   BUN 18 04/15/2017   NA 138 04/15/2017   K 4.1 04/15/2017   CL 102 02/27/2016   CO2 24 02/27/2016     Hx of DM II, he is currently following with Dr Everardo All. He is not sure why he was taking Bromocriptine. He tells me that he had a "doctor from [facility]" visiting him at home and told him this was an "old medication." States that he does not want to resume, it was also expensive.  He is tolerating Metformin well. He would like to have A1C check today.  Lab Results  Component Value Date   HGBA1C 7.5 05/20/2017   Denies polydipsia, polyuria, or polyphagia. No numbness, tingling, or burning.  FG 90's-120 and post prandials 130-140, no BS's > 200.  Last eye exam about a year ago.Marland Kitchen  Hyperlipidemia:  Currently on Crestor 10 mg daily. Following a low fat diet: "Trying."  He has not noted side effects with medication.  Lab Results  Component Value Date   CHOL 121 (L) 04/30/2016   HDL 39 (L) 04/30/2016   LDLCALC 56 04/30/2016   TRIG 128 04/30/2016   CHOLHDL 3.1 04/30/2016   He does not exercise regularly due to chronic foot pain.He follows with podiatrists.  GERD: He is currently on Omeprazole 40 mg daily.  Denies abdominal pain, nausea, vomiting, changes in bowel habits, blood in stool or melena.  Gout: He is currently on Allopurinol 300 g daily. Last gout attack when he first started medication, years ago.   Lab Results  Component Value Date   LABURIC 4.3 11/29/2015     Review of Systems    Constitutional: Negative for activity change, appetite change, fatigue, fever and unexpected weight change.  HENT: Negative for mouth sores, nosebleeds, sore throat and trouble swallowing.   Eyes: Negative for redness and visual disturbance.  Respiratory: Negative for apnea, cough, shortness of breath and wheezing.   Cardiovascular: Negative for chest pain, palpitations and leg swelling.  Gastrointestinal: Negative for abdominal pain, nausea and vomiting.       No changes in bowel habits.  Endocrine: Negative for polydipsia, polyphagia and polyuria.  Genitourinary: Negative for decreased urine volume, dysuria and hematuria.  Musculoskeletal: Positive for arthralgias. Negative for gait problem.  Skin: Negative for rash and wound.  Neurological: Negative for syncope, weakness, numbness and headaches.  Psychiatric/Behavioral: Negative for confusion. The patient is nervous/anxious.      Current Outpatient Prescriptions on File Prior to Visit  Medication Sig Dispense Refill  . Alcohol Swabs (B-D SINGLE USE SWABS REGULAR) PADS 1 Device by Does not apply route daily. 90 each 3  . allopurinol (ZYLOPRIM) 300 MG tablet Take 1 tablet (300 mg total) by mouth daily. 90 tablet 1  . amLODipine (NORVASC) 10 MG tablet Take 1 tablet (10 mg total) by mouth daily. 90 tablet 1  . glucose blood (ACCU-CHEK SMARTVIEW) test strip Use as instructed (Patient taking differently: Use to test blood sugars 2-3 times daily.) 100 each 12  .  losartan-hydrochlorothiazide (HYZAAR) 100-12.5 MG tablet Take 1 tablet by mouth daily. 90 tablet 1  . omeprazole (PRILOSEC) 40 MG capsule Take 1 capsule (40 mg total) by mouth daily. 30 capsule 2   No current facility-administered medications on file prior to visit.      Past Medical History:  Diagnosis Date  . Abnormal carotid ultrasound 07/2015   mild bilateral stenosis, repeat in 1 year.  Dr. Rennis Golden  . Diabetes mellitus without complication ([facility])   . Gout   . Hepatitis C  antibody test positive 05/2015   prior infection, resolved  . History of cardiovascular stress test 9/16   Lexiscan, EF 55%, no ischemia, Dr. Rennis Golden  . Hyperlipidemia   . Hypertension   . Noncompliance   . Obesity   . PVD (peripheral vascular disease) ([facility]) 07/2015   normal ABIs, Dr. Rennis Golden    Allergies  Allergen Reactions  . Penicillins Other (See Comments)    Per pt blacked out after injection as a child.     Social History   Social History  . Marital status: Divorced    Spouse name: N/A  . Number of children: N/A  . Years of education: N/A   Social History Main Topics  . Smoking status: Former Smoker    Packs/day: 1.00    Years: 30.00    Types: Cigarettes    Quit date: 07/31/1995  . Smokeless tobacco: Never Used  . Alcohol use No  . Drug use: No  . Sexual activity: Not Asked   Other Topics Concern  . None   Social History Narrative  . None    Vitals:   08/13/17 0817  BP: 126/80  Pulse: 78  Resp: 12  SpO2: 97%   Body mass index is 40.92 kg/m.   Physical Exam  Nursing note and vitals reviewed. Constitutional: He is oriented to person, place, and time. He appears well-developed. No distress.  HENT:  Head: Normocephalic and atraumatic.  Mouth/Throat: Oropharynx is clear and moist and mucous membranes are normal.  Eyes: Pupils are equal, round, and reactive to light. Conjunctivae are normal.  Cardiovascular: Normal rate and regular rhythm.   No murmur heard. Pulses:      Dorsalis pedis pulses are 2+ on the right side, and 2+ on the left side.  Respiratory: Effort normal and breath sounds normal. No respiratory distress.  GI: Soft. He exhibits no mass. There is no hepatomegaly. There is no tenderness.  Musculoskeletal: He exhibits no edema.  Lymphadenopathy:    He has no cervical adenopathy.  Neurological: He is alert and oriented to person, place, and time. He has normal strength.  Skin: Skin is warm. No erythema.  Psychiatric: He has a normal mood  and affect. Cognition and memory are normal.  Well groomed, good eye contact.   Diabetic Foot Exam - Simple   Simple Foot Form Visual Inspection See comments:  Yes Sensation Testing Intact to touch and monofilament testing bilaterally:  Yes Pulse Check Posterior Tibialis and Dorsalis pulse intact bilaterally:  Yes Comments Calluses and mild IP deformities bilateral.      ASSESSMENT AND PLAN:   [name] was seen today for follow-up.  Diagnoses and all orders for this visit:  Lab Results  Component Value Date   CREATININE 1.14 08/13/2017   BUN 17 08/13/2017   NA 142 08/13/2017   K 4.1 08/13/2017   CL 104 08/13/2017   CO2 30 08/13/2017   Lab Results  Component Value Date   CHOL 133 08/13/2017  HDL 37.50 (L) 08/13/2017   LDLCALC 78 08/13/2017   TRIG 86.0 08/13/2017   CHOLHDL 4 08/13/2017   Lab Results  Component Value Date   LABURIC 4.3 08/13/2017     Diabetes mellitus with complication ([facility])  Explained that it has been < 3 months since his last HgA1C. Recommend continue following with Dr Everardo All. He is due for eye exam.  -     metFORMIN (GLUCOPHAGE) 500 MG tablet; Take 1 tablet (500 mg total) by mouth 2 (two) times daily with a meal.  Essential hypertension  Adequately controlled. No changes in current management. DASH diet recommended. Eye exam recommended annually. F/U in 4 months, before if needed.  -     Basic metabolic panel  Hyperlipidemia, unspecified hyperlipidemia type  No changes in Crestor. We will follow labs done today and will give further recommendations accordingly. F/U in 6-12 months.  -     rosuvastatin (CRESTOR) 10 MG tablet; Take 1 tablet (10 mg total) by mouth daily. -     Lipid panel  Idiopathic chronic gout without tophus, unspecified site  No changes in current management, will follow Uric acid adjust med if needed. F/U in 12 months.  -     Uric acid  Class 3 obesity with serious comorbidity and body mass index  (BMI) of 40.0 to 44.9 in adult, unspecified obesity type (HCC)  We discussed benefits of wt loss as well as adverse effects of obesity. Consistency with healthy diet and physical activity recommended.   Not sure about Prevanr 13, will review records from former PCP.    -Charles [name] was advised to return sooner than planned today if new concerns arise.     [name]. [place], MD  [facility]. [place] office.

## 2017-08-13 ENCOUNTER — Encounter: Payer: Self-pay | Admitting: Family Medicine

## 2017-08-13 ENCOUNTER — Ambulatory Visit (INDEPENDENT_AMBULATORY_CARE_PROVIDER_SITE_OTHER): Payer: Medicare PPO | Admitting: Family Medicine

## 2017-08-13 VITALS — BP 126/80 | HR 78 | Resp 12 | Ht 66.0 in | Wt 253.5 lb

## 2017-08-13 DIAGNOSIS — E118 Type 2 diabetes mellitus with unspecified complications: Secondary | ICD-10-CM

## 2017-08-13 DIAGNOSIS — M1A00X0 Idiopathic chronic gout, unspecified site, without tophus (tophi): Secondary | ICD-10-CM

## 2017-08-13 DIAGNOSIS — E785 Hyperlipidemia, unspecified: Secondary | ICD-10-CM

## 2017-08-13 DIAGNOSIS — IMO0001 Reserved for inherently not codable concepts without codable children: Secondary | ICD-10-CM

## 2017-08-13 DIAGNOSIS — Z6841 Body Mass Index (BMI) 40.0 and over, adult: Secondary | ICD-10-CM

## 2017-08-13 DIAGNOSIS — I10 Essential (primary) hypertension: Secondary | ICD-10-CM | POA: Diagnosis not present

## 2017-08-13 DIAGNOSIS — E669 Obesity, unspecified: Secondary | ICD-10-CM | POA: Diagnosis not present

## 2017-08-13 LAB — BASIC METABOLIC PANEL
BUN: 17 mg/dL (ref 6–23)
CO2: 30 mEq/L (ref 19–32)
Calcium: 9.8 mg/dL (ref 8.4–10.5)
Chloride: 104 mEq/L (ref 96–112)
Creatinine, Ser: 1.14 mg/dL (ref 0.40–1.50)
GFR: 82.75 mL/min (ref >60.00)
Glucose, Bld: 107 mg/dL — ABNORMAL HIGH (ref 70–99)
POTASSIUM: 4.1 meq/L (ref 3.5–5.1)
SODIUM: 142 meq/L (ref 135–145)

## 2017-08-13 LAB — LIPID PANEL
CHOLESTEROL: 133 mg/dL (ref 0–200)
HDL: 37.5 mg/dL — AB (ref >39.00)
LDL Cholesterol: 78 mg/dL (ref 0–99)
NonHDL: 95.35
Total CHOL/HDL Ratio: 4
Triglycerides: 86 mg/dL (ref 0.0–149.0)
VLDL: 17.2 mg/dL (ref 0.0–40.0)

## 2017-08-13 LAB — URIC ACID: URIC ACID, SERUM: 4.3 mg/dL (ref 4.0–7.8)

## 2017-08-13 MED ORDER — ROSUVASTATIN CALCIUM 10 MG PO TABS: 10.0000 mg | ORAL_TABLET | Freq: Every day | ORAL | 3 refills | Status: DC

## 2017-08-13 MED ORDER — METFORMIN HCL 500 MG PO TABS: 500.0000 mg | ORAL_TABLET | Freq: Two times a day (BID) | ORAL | 3 refills | Status: DC

## 2017-08-13 NOTE — Patient Instructions (Addendum)
A few things to remember from today's visit:   Essential hypertension  Hyperlipidemia, unspecified hyperlipidemia type  PAD (peripheral artery disease) ([facility])  Idiopathic chronic gout without tophus, unspecified site  Class 3 obesity with serious comorbidity and body mass index (BMI) of 40.0 to 44.9 in adult, unspecified obesity type ([facility]CC) Metformin was sent to your pharmacy by Dr Everardo All in 06/2017.     Blood pressure goal for most people is less than 140/90. Some populations (older than 60) the goal is less than 150/90.  Most recent cardiologists' recommendations recommend blood pressure at or less than 130/80.   Elevated blood pressure increases the risk of strokes, heart and kidney disease, and eye problems. Regular physical activity and a healthy diet (DASH diet) usually help. Low salt diet. Take medications as instructed.  Caution with some over the counter medications as cold medications, dietary products (for weight loss), and Ibuprofen or Aleve (frequent use);all these medications could cause elevation of blood pressure.   DASH Eating Plan DASH stands for "Dietary Approaches to Stop Hypertension." The DASH eating plan is a healthy eating plan that has been shown to reduce high blood pressure (hypertension). It may also reduce your risk for type 2 diabetes, heart disease, and stroke. The DASH eating plan may also help with weight loss. What are tips for following this plan? General guidelines  Avoid eating more than 2,300 mg (milligrams) of salt (sodium) a day. If you have hypertension, you may need to reduce your sodium intake to 1,500 mg a day.  Limit alcohol intake to no more than 1 drink a day for nonpregnant women and 2 drinks a day for men. One drink equals 12 oz of beer, 5 oz of wine, or 1 oz of hard liquor.  Work with your health care provider to maintain a healthy body weight or to lose weight. Ask what an ideal weight is for you.  Get at least 30 minutes of  exercise that causes your heart to beat faster (aerobic exercise) most days of the week. Activities may include walking, swimming, or biking.  Work with your health care provider or diet and nutrition specialist (dietitian) to adjust your eating plan to your individual calorie needs. Reading food labels  Check food labels for the amount of sodium per serving. Choose foods with less than 5 percent of the Daily Value of sodium. Generally, foods with less than 300 mg of sodium per serving fit into this eating plan.  To find whole grains, look for the word "whole" as the first word in the ingredient list. Shopping  Buy products labeled as "low-sodium" or "no salt added."  Buy fresh foods. Avoid canned foods and premade or frozen meals. Cooking  Avoid adding salt when cooking. Use salt-free seasonings or herbs instead of table salt or sea salt. Check with your health care provider or pharmacist before using salt substitutes.  Do not fry foods. Cook foods using healthy methods such as baking, boiling, grilling, and broiling instead.  Cook with heart-healthy oils, such as olive, canola, soybean, or sunflower oil. Meal planning   Eat a balanced diet that includes: ? 5 or more servings of fruits and vegetables each day. At each meal, try to fill half of your plate with fruits and vegetables. ? Up to 6-8 servings of whole grains each day. ? Less than 6 oz of lean meat, poultry, or fish each day. A 3-oz serving of meat is about the same size as a deck of cards. One egg  equals 1 oz. ? 2 servings of low-fat dairy each day. ? A serving of nuts, seeds, or beans 5 times each week. ? Heart-healthy fats. Healthy fats called Omega-3 fatty acids are found in foods such as flaxseeds and coldwater fish, like sardines, salmon, and mackerel.  Limit how much you eat of the following: ? Canned or prepackaged foods. ? Food that is high in trans fat, such as fried foods. ? Food that is high in saturated fat,  such as fatty meat. ? Sweets, desserts, sugary drinks, and other foods with added sugar. ? Full-fat dairy products.  Do not salt foods before eating.  Try to eat at least 2 vegetarian meals each week.  Eat more home-cooked food and less restaurant, buffet, and fast food.  When eating at a restaurant, ask that your food be prepared with less salt or no salt, if possible. What foods are recommended? The items listed may not be a complete list. Talk with your dietitian about what dietary choices are best for you. Grains Whole-grain or whole-wheat bread. Whole-grain or whole-wheat pasta. Brown rice. Orpah Cobb. Bulgur. Whole-grain and low-sodium cereals. Pita bread. Low-fat, low-sodium crackers. Whole-wheat flour tortillas. Vegetables Fresh or frozen vegetables (raw, steamed, roasted, or grilled). Low-sodium or reduced-sodium tomato and vegetable juice. Low-sodium or reduced-sodium tomato sauce and tomato paste. Low-sodium or reduced-sodium canned vegetables. Fruits All fresh, dried, or frozen fruit. Canned fruit in natural juice (without added sugar). Meat and other protein foods Skinless chicken or [place]. Ground chicken or [place]. Pork with fat trimmed off. Fish and seafood. Egg whites. Dried beans, peas, or lentils. Unsalted nuts, nut butters, and seeds. Unsalted canned beans. Lean cuts of beef with fat trimmed off. Low-sodium, lean deli meat. Dairy Low-fat (1%) or fat-free (skim) milk. Fat-free, low-fat, or reduced-fat cheeses. Nonfat, low-sodium ricotta or cottage cheese. Low-fat or nonfat yogurt. Low-fat, low-sodium cheese. Fats and oils Soft margarine without trans fats. Vegetable oil. Low-fat, reduced-fat, or light mayonnaise and salad dressings (reduced-sodium). Canola, safflower, olive, soybean, and sunflower oils. Avocado. Seasoning and other foods Herbs. Spices. Seasoning mixes without salt. Unsalted popcorn and pretzels. Fat-free sweets. What foods are not recommended? The  items listed may not be a complete list. Talk with your dietitian about what dietary choices are best for you. Grains Baked goods made with fat, such as croissants, muffins, or some breads. Dry pasta or rice meal packs. Vegetables Creamed or fried vegetables. Vegetables in a cheese sauce. Regular canned vegetables (not low-sodium or reduced-sodium). Regular canned tomato sauce and paste (not low-sodium or reduced-sodium). Regular tomato and vegetable juice (not low-sodium or reduced-sodium). Rosita Fire. Olives. Fruits Canned fruit in a light or heavy syrup. Fried fruit. Fruit in cream or butter sauce. Meat and other protein foods Fatty cuts of meat. Ribs. Fried meat. Tomasa Blase. Sausage. Bologna and other processed lunch meats. Salami. Fatback. Hotdogs. Bratwurst. Salted nuts and seeds. Canned beans with added salt. Canned or smoked fish. Whole eggs or egg yolks. Chicken or [place] with skin. Dairy Whole or 2% milk, cream, and half-and-half. Whole or full-fat cream cheese. Whole-fat or sweetened yogurt. Full-fat cheese. Nondairy creamers. Whipped toppings. Processed cheese and cheese spreads. Fats and oils Butter. Stick margarine. Lard. Shortening. Ghee. Bacon fat. Tropical oils, such as coconut, palm kernel, or palm oil. Seasoning and other foods Salted popcorn and pretzels. Onion salt, garlic salt, seasoned salt, table salt, and sea salt. Worcestershire sauce. Tartar sauce. Barbecue sauce. Teriyaki sauce. Soy sauce, including reduced-sodium. Steak sauce. Canned and packaged gravies. Fish sauce.  Oyster sauce. Cocktail sauce. Horseradish that you find on the shelf. Ketchup. Mustard. Meat flavorings and tenderizers. Bouillon cubes. Hot sauce and Tabasco sauce. Premade or packaged marinades. Premade or packaged taco seasonings. Relishes. Regular salad dressings. Where to find more information:  National Heart, Lung, and Blood Institute: PopSteam.is  American Heart Association:  www.heart.org Summary  The DASH eating plan is a healthy eating plan that has been shown to reduce high blood pressure (hypertension). It may also reduce your risk for type 2 diabetes, heart disease, and stroke.  With the DASH eating plan, you should limit salt (sodium) intake to 2,300 mg a day. If you have hypertension, you may need to reduce your sodium intake to 1,500 mg a day.  When on the DASH eating plan, aim to eat more fresh fruits and vegetables, whole grains, lean proteins, low-fat dairy, and heart-healthy fats.  Work with your health care provider or diet and nutrition specialist (dietitian) to adjust your eating plan to your individual calorie needs. This information is not intended to replace advice given to you by your health care provider. Make sure you discuss any questions you have with your health care provider. Document Released: 11/26/2011 Document Revised: 11/30/2016 Document Reviewed: 11/30/2016 Elsevier Interactive Patient Education  2017 [employer].  Please be sure medication list is accurate. If a new problem present, please set up appointment sooner than planned today.

## 2017-08-14 ENCOUNTER — Encounter: Payer: Self-pay | Admitting: Family Medicine

## 2017-08-18 ENCOUNTER — Ambulatory Visit: Payer: Medicare PPO | Admitting: Endocrinology

## 2017-09-10 ENCOUNTER — Ambulatory Visit: Payer: Medicare PPO | Admitting: Endocrinology

## 2017-10-01 ENCOUNTER — Encounter: Payer: Self-pay | Admitting: Endocrinology

## 2017-10-01 ENCOUNTER — Ambulatory Visit (INDEPENDENT_AMBULATORY_CARE_PROVIDER_SITE_OTHER): Payer: Medicare PPO | Admitting: Endocrinology

## 2017-10-01 VITALS — BP 144/64 | HR 102 | Wt 258.4 lb

## 2017-10-01 DIAGNOSIS — E118 Type 2 diabetes mellitus with unspecified complications: Secondary | ICD-10-CM | POA: Diagnosis not present

## 2017-10-01 LAB — POCT GLYCOSYLATED HEMOGLOBIN (HGB A1C): HEMOGLOBIN A1C: 6.8

## 2017-10-01 NOTE — Patient Instructions (Addendum)
check your blood sugar once a day.  vary the time of day when you check, between before the 3 meals, and at bedtime.  also check if you have symptoms of your blood sugar being too high or too low.  please keep a record of the readings and bring it to your next appointment here (or you can bring the meter itself).  You can write it on any piece of paper.  please call us sooner if your blood sugar goes below 70, or if you have a lot of readings over 200. Please continue the same metformin. Please come back for a follow-up appointment in 3 months.   

## 2017-10-01 NOTE — Progress Notes (Signed)
69 

## 2017-10-01 NOTE — Progress Notes (Signed)
Subjective:    Patient ID: Charles [name], male    DOB: [birth year], 66 y.o.   MRN: [id number]  HPI Pt returns for f/u of diabetes mellitus: DM type: 2 Dx'ed: 1324 Complications: PAD Therapy: metformin  DKA: never Severe hypoglycemia: never Pancreatitis: never Pancreatic imaging: never Other: he declines metformin-XR; he declines weight loss surgery; edema limits rx options Interval history: pt states he feels well in general.  pt says he stopped bromocriptine, because [employer] Dr advised him to d/c it.   Past Medical History:  Diagnosis Date  . Abnormal carotid ultrasound 07/2015   mild bilateral stenosis, repeat in 1 year.  Dr. Debara Pickett  . Diabetes mellitus without complication ([place])   . Gout   . Hepatitis C antibody test positive 05/2015   prior infection, resolved  . History of cardiovascular stress test 9/16   Lexiscan, EF 55%, no ischemia, Dr. Debara Pickett  . Hyperlipidemia   . Hypertension   . Noncompliance   . Obesity   . PVD (peripheral vascular disease) ([place]) 07/2015   normal ABIs, Dr. Debara Pickett    Past Surgical History:  Procedure Laterality Date  . BALLOON ANGIOPLASTY, ARTERY N/A also placed stent  . COLONOSCOPY  2016   Dr. Benson Norway    Social History   Social History  . Marital status: Divorced    Spouse name: N/A  . Number of children: N/A  . Years of education: N/A   Occupational History  . Not on file.   Social History Main Topics  . Smoking status: Former Smoker    Packs/day: 1.00    Years: 30.00    Types: Cigarettes    Quit date: 07/31/1995  . Smokeless tobacco: Never Used  . Alcohol use No  . Drug use: No  . Sexual activity: Not on file   Other Topics Concern  . Not on file   Social History Narrative  . No narrative on file    Current Outpatient Prescriptions on File Prior to Visit  Medication Sig Dispense Refill  . ACCU-CHEK FASTCLIX LANCETS MISC Use to check blood sugars 2-3 times daily.    . Alcohol Swabs (B-D SINGLE USE SWABS REGULAR) PADS 1  Device by Does not apply route daily. 90 each 3  . allopurinol (ZYLOPRIM) 300 MG tablet Take 1 tablet (300 mg total) by mouth daily. 90 tablet 1  . amLODipine (NORVASC) 10 MG tablet Take 1 tablet (10 mg total) by mouth daily. 90 tablet 1  . Blood Glucose Monitoring Suppl (ACCU-CHEK NANO SMARTVIEW) w/Device KIT Use as directed.    Marland Kitchen glucose blood (ACCU-CHEK SMARTVIEW) test strip Use as instructed (Patient taking differently: Use to test blood sugars 2-3 times daily.) 100 each 12  . losartan-hydrochlorothiazide (HYZAAR) 100-12.5 MG tablet Take 1 tablet by mouth daily. 90 tablet 1  . metFORMIN (GLUCOPHAGE) 500 MG tablet Take 1 tablet (500 mg total) by mouth 2 (two) times daily with a meal. 180 tablet 3  . omeprazole (PRILOSEC) 40 MG capsule Take 1 capsule (40 mg total) by mouth daily. 30 capsule 2  . rosuvastatin (CRESTOR) 10 MG tablet Take 1 tablet (10 mg total) by mouth daily. 90 tablet 3   No current facility-administered medications on file prior to visit.     Allergies  Allergen Reactions  . Penicillins Other (See Comments)    Per pt blacked out after injection as a child.     Family History  Problem Relation Age of Onset  . Asthma Mother   . Heart  attack Mother   . Hypertension Mother   . Hyperlipidemia Mother   . Hypertension Father   . Diabetes Father   . HIV Sister   . Liver disease Brother   . Lung cancer Brother   . Hypertension Brother     BP (!) 144/64   Pulse (!) 102   Wt 258 lb 6.4 oz (117.2 kg)   SpO2 95%   BMI 41.71 kg/m   Review of Systems Denies n/v.  He has lost 2 lbs.      Objective:   Physical Exam VITAL SIGNS:  See vs page GENERAL: no distress Pulses: foot pulses are intact bilaterally.   MSK: no deformity of the feet or ankles.  CV: 1+ bilat bilat edema of the legs Skin:  no ulcer on the feet or ankles.  normal color and temp on the feet and ankles Neuro: sensation is intact to touch on the feet and ankles.     A1c=6.8%  Lab Results    Component Value Date   CREATININE 1.14 08/13/2017   BUN 17 08/13/2017   NA 142 08/13/2017   K 4.1 08/13/2017   CL 104 08/13/2017   CO2 30 08/13/2017      Assessment & Plan:  Type 2 DM: well-controlled.  Edema, persistent: this limits rx options.  HTN: recheck next time  Patient Instructions  check your blood sugar once a day.  vary the time of day when you check, between before the 3 meals, and at bedtime.  also check if you have symptoms of your blood sugar being too high or too low.  please keep a record of the readings and bring it to your next appointment here (or you can bring the meter itself).  You can write it on any piece of paper.  please call us sooner if your blood sugar goes below 70, or if you have a lot of readings over 200.   Please continue the same metformin Please come back for a follow-up appointment in 3 months.

## 2017-11-08 ENCOUNTER — Other Ambulatory Visit: Payer: Self-pay | Admitting: Family Medicine

## 2017-11-19 DIAGNOSIS — L853 Xerosis cutis: Secondary | ICD-10-CM | POA: Diagnosis not present

## 2017-11-19 DIAGNOSIS — E119 Type 2 diabetes mellitus without complications: Secondary | ICD-10-CM | POA: Diagnosis not present

## 2017-11-19 DIAGNOSIS — L84 Corns and callosities: Secondary | ICD-10-CM | POA: Diagnosis not present

## 2017-12-08 ENCOUNTER — Ambulatory Visit: Payer: Medicare PPO | Admitting: Family Medicine

## 2017-12-27 NOTE — Progress Notes (Signed)
Charles [name] is a 67 y.o.male, who is here today to follow on HTN.  Last seen on 08/13/17. Since his last OV,he has followed with Dr Loanne Drilling for DM II.   Currently he is on Amlodipine 10 mg daily and Hyzaar 100-12.5 mg daily. Home BP's: He does not check BP at home. Last eye exam: About a year ago. He is taking medications as instructed, no side effects reported.  He has not noted unusual headache, visual changes, exertional chest pain, dyspnea, focal weakness, or edema.    Lab Results  Component Value Date   CREATININE 1.14 08/13/2017   BUN 17 08/13/2017   NA 142 08/13/2017   K 4.1 08/13/2017   CL 104 08/13/2017   CO2 30 08/13/2017    GERD: He is on Omeprazole 40 mg daily. It helps with burping and chest discomfort,exacerbated by certain foods.   "Minimized pain" but has not resolved it.  She is concerned about this chest discomfort, usually happens at rest associated with food intake.  He denies associated diaphoresis or syncope. Occasionally with deep breath due to pain so he feels short of breath.  He thinks he needs to see cardiologist again and he is requesting an EKG.  He follows with cardiologist already, last seen on 03/11/17.  According to patient, he was instructed to arrange follow-up appointment with Dr. Woodfin Ganja has not done it yet.  Chest discomfort is exacerbated by certain food intake and usually at rest.   Hyperlipidemia: He is also concerned about his cholesterol, he would like it Currently on Crestor 10 mg daily. Following a low fat diet: Not consistently.Marland Kitchen  He has not noted side effects with medication.  Lab Results  Component Value Date   CHOL 133 08/13/2017   HDL 37.50 (L) 08/13/2017   LDLCALC 78 08/13/2017   TRIG 86.0 08/13/2017   CHOLHDL 4 08/13/2017     Review of Systems  Constitutional: Negative for activity change, appetite change, fatigue and fever.  HENT: Negative for nosebleeds, sore throat and trouble swallowing.     Eyes: Negative for redness and visual disturbance.  Respiratory: Negative for cough, shortness of breath and wheezing.   Cardiovascular: Negative for palpitations and leg swelling.  Gastrointestinal: Negative for abdominal pain, nausea and vomiting.  Endocrine: Negative for polydipsia, polyphagia and polyuria.  Genitourinary: Negative for decreased urine volume, dysuria and hematuria.  Musculoskeletal: Negative for gait problem and myalgias.  Skin: Negative for rash and wound.  Neurological: Negative for syncope, weakness and headaches.  Psychiatric/Behavioral: Negative for confusion. The patient is nervous/anxious.      Current Outpatient Medications on File Prior to Visit  Medication Sig Dispense Refill  . ACCU-CHEK FASTCLIX LANCETS MISC Use to check blood sugars 2-3 times daily.    . Alcohol Swabs (B-D SINGLE USE SWABS REGULAR) PADS 1 Device by Does not apply route daily. 90 each 3  . allopurinol (ZYLOPRIM) 300 MG tablet TAKE 1 TABLET (300 MG TOTAL) BY MOUTH DAILY. 90 tablet 1  . amLODipine (NORVASC) 10 MG tablet TAKE 1 TABLET EVERY DAY 90 tablet 1  . Blood Glucose Monitoring Suppl (ACCU-CHEK NANO SMARTVIEW) w/Device KIT Use as directed.    Marland Kitchen glucose blood (ACCU-CHEK SMARTVIEW) test strip Use as instructed (Patient taking differently: Use to test blood sugars 2-3 times daily.) 100 each 12  . losartan-hydrochlorothiazide (HYZAAR) 100-12.5 MG tablet Take 1 tablet by mouth daily. 90 tablet 1  . metFORMIN (GLUCOPHAGE) 500 MG tablet Take 1 tablet (500 mg total)  by mouth 2 (two) times daily with a meal. 180 tablet 3  . rosuvastatin (CRESTOR) 10 MG tablet Take 1 tablet (10 mg total) by mouth daily. 90 tablet 3   No current facility-administered medications on file prior to visit.      Past Medical History:  Diagnosis Date  . Abnormal carotid ultrasound 07/2015   mild bilateral stenosis, repeat in 1 year.  Dr. Debara Pickett  . Diabetes mellitus without complication ([place])   . Gout   . Hepatitis  C antibody test positive 05/2015   prior infection, resolved  . History of cardiovascular stress test 9/16   Lexiscan, EF 55%, no ischemia, Dr. Debara Pickett  . Hyperlipidemia   . Hypertension   . Noncompliance   . Obesity   . PVD (peripheral vascular disease) ([place]) 07/2015   normal ABIs, Dr. Debara Pickett    Allergies  Allergen Reactions  . Penicillins Other (See Comments)    Per pt blacked out after injection as a child.     Social History   Socioeconomic History  . Marital status: Divorced    Spouse name: None  . Number of children: None  . Years of education: None  . Highest education level: None  Social Needs  . Financial resource strain: None  . Food insecurity - worry: None  . Food insecurity - inability: None  . Transportation needs - medical: None  . Transportation needs - non-medical: None  Occupational History  . None  Tobacco Use  . Smoking status: Former Smoker    Packs/day: 1.00    Years: 30.00    Pack years: 30.00    Types: Cigarettes    Last attempt to quit: 07/31/1995    Years since quitting: 22.4  . Smokeless tobacco: Never Used  Substance and Sexual Activity  . Alcohol use: No    Alcohol/week: 0.0 oz  . Drug use: No  . Sexual activity: None  Other Topics Concern  . None  Social History Narrative  . None    Vitals:   12/28/17 0850  BP: 130/86  Pulse: 66  Temp: 98.5 F (36.9 C)  SpO2: 98%   Body mass index is 42.05 kg/m.    Physical Exam  Nursing note and vitals reviewed. Constitutional: He is oriented to person, place, and time. He appears well-developed. No distress.  HENT:  Head: Normocephalic and atraumatic.  Mouth/Throat: Oropharynx is clear and moist and mucous membranes are normal.  Poor dentition.  Eyes: Conjunctivae are normal. Pupils are equal, round, and reactive to light.  Cardiovascular: Normal rate and regular rhythm.  No murmur heard. Pulses:      Dorsalis pedis pulses are 2+ on the right side, and 2+ on the left side.    Respiratory: Effort normal and breath sounds normal. No respiratory distress.  GI: Soft. He exhibits no mass. There is no tenderness.  Musculoskeletal: He exhibits edema (Trace pitting LE edema,bilateral.). He exhibits no tenderness.  Lymphadenopathy:    He has no cervical adenopathy.  Neurological: He is alert and oriented to person, place, and time. He has normal strength. Gait normal.  Skin: Skin is warm. No rash noted. No erythema.  Psychiatric: His mood appears anxious. His affect is blunt. Cognition and memory are normal.  Fairly groomed, good eye contact.    ASSESSMENT AND PLAN:   Charles [name] was seen today for follow-up.  Diagnoses and all orders for this visit:  Lab Results  Component Value Date   ALT 22 12/28/2017   AST 21  12/28/2017   ALKPHOS 103 12/28/2017   BILITOT 0.4 12/28/2017   Lab Results  Component Value Date   CREATININE 1.19 12/28/2017   BUN 18 12/28/2017   NA 140 12/28/2017   K 3.9 12/28/2017   CL 101 12/28/2017   CO2 30 12/28/2017   Lab Results  Component Value Date   CHOL 105 12/28/2017   HDL 40.20 12/28/2017   LDLCALC 50 12/28/2017   TRIG 75.0 12/28/2017   CHOLHDL 3 12/28/2017    Chest discomfort  Possible etiologies discussed. Hx does not suggest cardiac etiology but he has several CV risk factors.  EKG today: SR,LAD,LAFB.Compared with EKG done in 02/2017 no significant changes except for LAFB now present. Strongly recommended following with cardiologist. Instructed about warning signs.   -     EKG 12-Lead  Gastroesophageal reflux disease, esophagitis presence not specified  This problem could be contributing to chest discomfort. He is not interested in trying a different PPI, so for now no changes in Omeprazole dose. Avoid trigger factors. GERD precautions. Follow-up in 3-4 months before if needed.  Essential hypertension  Adequately controlled. No changes in current management. DASH-low salt diet recommended. Eye exam  recommended annually. F/U in 5 months, before if needed.  -     EKG 12-Lead -     Comprehensive metabolic panel  Hyperlipidemia, unspecified hyperlipidemia type  No changes in current management, will follow labs done today and will give further recommendations accordingly. F/U in 6-12 months.  -     Lipid panel    -Charles [name] was advised to return sooner than planned today if new concerns arise.     [name]. Martinique, MD  [facility]. [place] [facility].

## 2017-12-28 ENCOUNTER — Ambulatory Visit: Payer: Medicare PPO | Admitting: Endocrinology

## 2017-12-28 ENCOUNTER — Other Ambulatory Visit: Payer: Self-pay | Admitting: *Deleted

## 2017-12-28 ENCOUNTER — Ambulatory Visit: Payer: Medicare PPO | Admitting: Family Medicine

## 2017-12-28 ENCOUNTER — Encounter: Payer: Self-pay | Admitting: Family Medicine

## 2017-12-28 VITALS — BP 130/86 | HR 66 | Temp 98.5°F | Ht 66.0 in | Wt 260.5 lb

## 2017-12-28 DIAGNOSIS — K219 Gastro-esophageal reflux disease without esophagitis: Secondary | ICD-10-CM | POA: Diagnosis not present

## 2017-12-28 DIAGNOSIS — R0789 Other chest pain: Secondary | ICD-10-CM | POA: Diagnosis not present

## 2017-12-28 DIAGNOSIS — E785 Hyperlipidemia, unspecified: Secondary | ICD-10-CM

## 2017-12-28 DIAGNOSIS — I10 Essential (primary) hypertension: Secondary | ICD-10-CM

## 2017-12-28 LAB — COMPREHENSIVE METABOLIC PANEL
ALBUMIN: 4.3 g/dL (ref 3.5–5.2)
ALK PHOS: 103 U/L (ref 39–117)
ALT: 22 U/L (ref 0–53)
AST: 21 U/L (ref 0–37)
BILIRUBIN TOTAL: 0.4 mg/dL (ref 0.2–1.2)
BUN: 18 mg/dL (ref 6–23)
CO2: 30 mEq/L (ref 19–32)
CREATININE: 1.19 mg/dL (ref 0.40–1.50)
Calcium: 9.5 mg/dL (ref 8.4–10.5)
Chloride: 101 mEq/L (ref 96–112)
GFR: 78.66 mL/min (ref >60.00)
Glucose, Bld: 142 mg/dL — ABNORMAL HIGH (ref 70–99)
POTASSIUM: 3.9 meq/L (ref 3.5–5.1)
SODIUM: 140 meq/L (ref 135–145)
TOTAL PROTEIN: 7.4 g/dL (ref 6.0–8.3)

## 2017-12-28 LAB — LIPID PANEL
CHOLESTEROL: 105 mg/dL (ref 0–200)
HDL: 40.2 mg/dL (ref >39.00)
LDL Cholesterol: 50 mg/dL (ref 0–99)
NONHDL: 65.16
Total CHOL/HDL Ratio: 3
Triglycerides: 75 mg/dL (ref 0.0–149.0)
VLDL: 15 mg/dL (ref 0.0–40.0)

## 2017-12-28 MED ORDER — OMEPRAZOLE 40 MG PO CPDR: 40.0000 mg | DELAYED_RELEASE_CAPSULE | Freq: Every day | ORAL | 1 refills | Status: DC

## 2017-12-28 NOTE — Patient Instructions (Addendum)
A few things to remember from today's visit:   Essential hypertension - Plan: EKG 12-Lead, Comprehensive metabolic panel  Gastroesophageal reflux disease, esophagitis presence not specified  Chest discomfort - Plan: EKG 12-Lead  Hyperlipidemia, unspecified hyperlipidemia type - Plan: Lipid panel  Please schedule appointment with Dr. Rennis GoldenHilty. No changes in medications today.  Please be sure medication list is accurate. If a new problem present, please set up appointment sooner than planned today.

## 2017-12-30 DIAGNOSIS — E1142 Type 2 diabetes mellitus with diabetic polyneuropathy: Secondary | ICD-10-CM | POA: Diagnosis not present

## 2017-12-30 DIAGNOSIS — Z6841 Body Mass Index (BMI) 40.0 and over, adult: Secondary | ICD-10-CM | POA: Diagnosis not present

## 2017-12-30 DIAGNOSIS — K219 Gastro-esophageal reflux disease without esophagitis: Secondary | ICD-10-CM | POA: Diagnosis not present

## 2017-12-30 DIAGNOSIS — E1165 Type 2 diabetes mellitus with hyperglycemia: Secondary | ICD-10-CM | POA: Diagnosis not present

## 2017-12-30 DIAGNOSIS — I10 Essential (primary) hypertension: Secondary | ICD-10-CM | POA: Diagnosis not present

## 2017-12-30 DIAGNOSIS — M109 Gout, unspecified: Secondary | ICD-10-CM | POA: Diagnosis not present

## 2017-12-30 DIAGNOSIS — E785 Hyperlipidemia, unspecified: Secondary | ICD-10-CM | POA: Diagnosis not present

## 2018-01-12 ENCOUNTER — Other Ambulatory Visit: Payer: Self-pay | Admitting: Family Medicine

## 2018-01-12 MED ORDER — PANTOPRAZOLE SODIUM 40 MG PO TBEC: 40.0000 mg | DELAYED_RELEASE_TABLET | Freq: Every day | ORAL | 1 refills | Status: DC

## 2018-02-01 ENCOUNTER — Ambulatory Visit: Payer: Medicare PPO | Admitting: Endocrinology

## 2018-02-04 ENCOUNTER — Emergency Department (HOSPITAL_COMMUNITY): Payer: Medicare PPO

## 2018-02-04 ENCOUNTER — Emergency Department (HOSPITAL_COMMUNITY)
Admission: EM | Admit: 2018-02-04 | Discharge: 2018-02-04 | Disposition: A | Discharge status: Home / Self Care | Payer: Medicare PPO | Attending: Emergency Medicine | Admitting: Emergency Medicine

## 2018-02-04 ENCOUNTER — Encounter (HOSPITAL_COMMUNITY): Payer: Self-pay | Admitting: Emergency Medicine

## 2018-02-04 ENCOUNTER — Other Ambulatory Visit: Payer: Self-pay

## 2018-02-04 DIAGNOSIS — E119 Type 2 diabetes mellitus without complications: Secondary | ICD-10-CM | POA: Diagnosis not present

## 2018-02-04 DIAGNOSIS — I10 Essential (primary) hypertension: Secondary | ICD-10-CM | POA: Diagnosis not present

## 2018-02-04 DIAGNOSIS — Z79899 Other long term (current) drug therapy: Secondary | ICD-10-CM | POA: Diagnosis not present

## 2018-02-04 DIAGNOSIS — S91201A Unspecified open wound of right great toe with damage to nail, initial encounter: Secondary | ICD-10-CM | POA: Diagnosis not present

## 2018-02-04 DIAGNOSIS — S90931A Unspecified superficial injury of right great toe, initial encounter: Secondary | ICD-10-CM | POA: Diagnosis present

## 2018-02-04 DIAGNOSIS — Y939 Activity, unspecified: Secondary | ICD-10-CM | POA: Diagnosis not present

## 2018-02-04 DIAGNOSIS — S91209A Unspecified open wound of unspecified toe(s) with damage to nail, initial encounter: Secondary | ICD-10-CM | POA: Diagnosis not present

## 2018-02-04 DIAGNOSIS — Z87891 Personal history of nicotine dependence: Secondary | ICD-10-CM | POA: Diagnosis not present

## 2018-02-04 DIAGNOSIS — Y999 Unspecified external cause status: Secondary | ICD-10-CM | POA: Insufficient documentation

## 2018-02-04 DIAGNOSIS — X58XXXA Exposure to other specified factors, initial encounter: Secondary | ICD-10-CM | POA: Insufficient documentation

## 2018-02-04 DIAGNOSIS — S99921A Unspecified injury of right foot, initial encounter: Secondary | ICD-10-CM | POA: Diagnosis not present

## 2018-02-04 DIAGNOSIS — Z7984 Long term (current) use of oral hypoglycemic drugs: Secondary | ICD-10-CM | POA: Insufficient documentation

## 2018-02-04 DIAGNOSIS — E1151 Type 2 diabetes mellitus with diabetic peripheral angiopathy without gangrene: Secondary | ICD-10-CM | POA: Diagnosis not present

## 2018-02-04 DIAGNOSIS — M79674 Pain in right toe(s): Secondary | ICD-10-CM | POA: Diagnosis not present

## 2018-02-04 DIAGNOSIS — Y929 Unspecified place or not applicable: Secondary | ICD-10-CM | POA: Diagnosis not present

## 2018-02-04 LAB — CBG MONITORING, ED: GLUCOSE-CAPILLARY: 115 mg/dL — AB (ref 65–99)

## 2018-02-04 MED ORDER — SULFAMETHOXAZOLE-TRIMETHOPRIM 800-160 MG PO TABS: 1.0000 | ORAL_TABLET | Freq: Two times a day (BID) | ORAL | 0 refills | Status: AC

## 2018-02-04 NOTE — ED Notes (Addendum)
RN buddy wrapped pt's right great toe. Injury noted to nail bed. Pt educated to look for signs of infection

## 2018-02-04 NOTE — ED Triage Notes (Signed)
Pt complaint of right great toe pain post injury 3-4 days ago.

## 2018-02-04 NOTE — ED Provider Notes (Signed)
[facility] DEPT Provider Note   CSN: [id number] Arrival date & time: 02/04/18  1112     History   Chief Complaint Chief Complaint  Patient presents with  . Toe Pain    HPI Charles [name] is a 67 y.o. male.  67 year old male with prior history of diabetes, GERD, hypertension, and hyperlipidemia presents with reported right great toe injury.  Patient reports that 4 days previously he came down on his right toe in a manner that "popped up" his right great toenail.  The toenail was not completely avulsed.  Patient reports the toenail was pointing up and then he "pushed it back down." Patient complains of the toe is still sore.  Patient denies drainage or erythema around the toenail.  Patient denies other injury.  Patient is able to ambulate without difficulty.   The history is provided by the patient.  Toe Pain  This is a new problem. The current episode started more than 2 days ago. The problem occurs rarely. The problem has not changed since onset.Pertinent negatives include no chest pain and no abdominal pain. The symptoms are aggravated by walking. He has tried nothing for the symptoms. The treatment provided no relief.    Past Medical History:  Diagnosis Date  . Abnormal carotid ultrasound 07/2015   mild bilateral stenosis, repeat in 1 year.  Dr. Debara Pickett  . Diabetes mellitus without complication ([place])   . Gout   . Hepatitis C antibody test positive 05/2015   prior infection, resolved  . History of cardiovascular stress test 9/16   Lexiscan, EF 55%, no ischemia, Dr. Debara Pickett  . Hyperlipidemia   . Hypertension   . Noncompliance   . Obesity   . PVD (peripheral vascular disease) ([place]) 07/2015   normal ABIs, Dr. Debara Pickett    Patient Active Problem List   Diagnosis Date Noted  . GERD (gastroesophageal reflux disease) 12/28/2017  . Chronic lower back pain 05/07/2017  . Excessive daytime sleepiness 03/11/2017  . Noncompliance 02/27/2016  . Chronic neck  pain 02/27/2016  . AP (abdominal pain) 02/27/2016  . Cervical radiculitis 02/27/2016  . Left hand paresthesia 02/27/2016  . Diabetes mellitus with complication ([place]) 62/26/3335  . Knee pain, acute 11/29/2015  . Need for prophylactic vaccination and inoculation against influenza 11/29/2015  . Cervical pain (neck) 11/29/2015  . Weight gain 11/29/2015  . Primary gout 11/29/2015  . PAD (peripheral artery disease) ([place]) 07/31/2015  . DOE (dyspnea on exertion) 07/31/2015  . Asthma 07/31/2015  . Class 3 obesity with serious comorbidity and body mass index (BMI) of 40.0 to 44.9 in adult 06/10/2015  . Hyperlipidemia 06/10/2015  . Diabetes type 2, controlled ([place]) 10/23/2014  . Atherosclerosis of aorta ([place]) 10/23/2014  . Acute idiopathic gout of multiple sites 10/23/2014  . Arcus senilis of both eyes 10/23/2014  . Diabetes type 2, uncontrolled ([place]) 10/09/2014  . Essential hypertension 10/09/2014  . Pain in joint, ankle and foot 10/09/2014    Past Surgical History:  Procedure Laterality Date  . BALLOON ANGIOPLASTY, ARTERY N/A also placed stent  . COLONOSCOPY  2016   Dr. Benson Norway       Home Medications    Prior to Admission medications   Medication Sig Start Date End Date Taking? Authorizing Provider  ACCU-CHEK FASTCLIX LANCETS MISC Use to check blood sugars 2-3 times daily.    [provider]  Alcohol Swabs (B-D SINGLE USE SWABS REGULAR) PADS 1 Device by Does not apply route daily. 06/24/17   Loanne Drilling,  Hilliard Clark, MD  allopurinol (ZYLOPRIM) 300 MG tablet TAKE 1 TABLET (300 MG TOTAL) BY MOUTH DAILY. 11/08/17   Martinique, [name], MD  amLODipine (NORVASC) 10 MG tablet TAKE 1 TABLET EVERY DAY 11/08/17   Martinique, [name], MD  Blood Glucose Monitoring Suppl (ACCU-CHEK NANO SMARTVIEW) w/Device KIT Use as directed. 07/14/17   [provider]  glucose blood (ACCU-CHEK SMARTVIEW) test strip Use as instructed Patient taking differently: Use to test blood sugars 2-3 times daily. 06/24/17    Renato Shin, MD  losartan-hydrochlorothiazide (HYZAAR) 100-12.5 MG tablet Take 1 tablet by mouth daily. 06/22/17   Martinique, [name], MD  metFORMIN (GLUCOPHAGE) 500 MG tablet Take 1 tablet (500 mg total) by mouth 2 (two) times daily with a meal. 08/13/17   Martinique, [name], MD  pantoprazole (PROTONIX) 40 MG tablet Take 1 tablet (40 mg total) by mouth daily. 01/12/18   Martinique, [name], MD  rosuvastatin (CRESTOR) 10 MG tablet Take 1 tablet (10 mg total) by mouth daily. 08/13/17   Martinique, [name], MD  sulfamethoxazole-trimethoprim (BACTRIM DS,SEPTRA DS) 800-160 MG tablet Take 1 tablet by mouth 2 (two) times daily for 7 days. 02/04/18 02/11/18  Valarie Merino, MD    Family History Family History  Problem Relation Age of Onset  . Asthma Mother   . Heart attack Mother   . Hypertension Mother   . Hyperlipidemia Mother   . Hypertension Father   . Diabetes Father   . HIV Sister   . Liver disease Brother   . Lung cancer Brother   . Hypertension Brother     Social History Social History   Tobacco Use  . Smoking status: Former Smoker    Packs/day: 1.00    Years: 30.00    Pack years: 30.00    Types: Cigarettes    Last attempt to quit: 07/31/1995    Years since quitting: 22.5  . Smokeless tobacco: Never Used  Substance Use Topics  . Alcohol use: No    Alcohol/week: 0.0 oz  . Drug use: No     Allergies   Penicillins   Review of Systems Review of Systems  Cardiovascular: Negative for chest pain.  Gastrointestinal: Negative for abdominal pain.  All other systems reviewed and are negative.    Physical Exam Updated Vital Signs BP 140/88 (BP Location: Right Arm)   Pulse 80   Temp 98 F (36.7 C) (Oral)   Resp 18   Ht 5' 6" (1.676 m)   Wt 117.9 kg (260 lb)   SpO2 95%   BMI 41.97 kg/m   Physical Exam  Constitutional: He is oriented to person, place, and time. He appears well-developed and well-nourished. No distress.  HENT:  Head: Normocephalic and atraumatic.  Mouth/Throat:  Oropharynx is clear and moist.  Eyes: Conjunctivae and EOM are normal. Pupils are equal, round, and reactive to light.  Neck: Normal range of motion. Neck supple.  Cardiovascular: Normal rate, regular rhythm and normal heart sounds.  Pulmonary/Chest: Effort normal and breath sounds normal. No respiratory distress.  Abdominal: Soft. He exhibits no distension. There is no tenderness.  Musculoskeletal: Normal range of motion. He exhibits no edema or deformity.  Right great toenail is mildly tender with palpation.  No surrounding erythema.  No drainage from underneath the nail.  No subungual hematoma noted.  Neurological: He is alert and oriented to person, place, and time.  Skin: Skin is warm and dry.  Psychiatric: He has a normal mood and affect.  Nursing note  and vitals reviewed.    ED Treatments / Results  Labs (all labs ordered are listed, but only abnormal results are displayed) Labs Reviewed  CBG MONITORING, ED - Abnormal; Notable for the following components:      Result Value   Glucose-Capillary 115 (*)    All other components within normal limits    EKG  EKG Interpretation None       Radiology Dg Foot Complete Right  Result Date: 02/04/2018 CLINICAL DATA:  Right great toe injury, pain EXAM: RIGHT FOOT COMPLETE - 3+ VIEW COMPARISON:  10/11/2014 FINDINGS: Advanced degenerative changes at the 1st MTP joint with joint space loss and spurring, similar to prior study. No acute bony abnormality. Specifically, no fracture, subluxation, or dislocation. IMPRESSION: No acute bony abnormality. Electronically Signed   By: Rolm Baptise M.D.   On: 02/04/2018 12:00    Procedures Procedures (including critical care time)  Medications Ordered in ED Medications - No data to display   Initial Impression / Assessment and Plan / ED Course  I have reviewed the triage vital signs and the nursing notes.  Pertinent labs & imaging results that were available during my care of the patient  were reviewed by me and considered in my medical decision making (see chart for details).     MDM  Screen Complete  Patient with recent right great toe injury.  Patient is concerned that since he is a diabetic that he may develop an infection.  Patient requests a course of antibiotics for same.  I do not see current evidence of an active infection.  No acute fracture found on x-ray.  Patient is educated that his nail may eventually fall off.  Patient does have a podiatrist that he will follow-up with.  Strict return precautions given and understood.  Final Clinical Impressions(s) / ED Diagnoses   Final diagnoses:  Avulsion of toenail, initial encounter    ED Discharge Orders        Ordered    sulfamethoxazole-trimethoprim (BACTRIM DS,SEPTRA DS) 800-160 MG tablet  2 times daily     02/04/18 1253       Valarie Merino, MD 02/04/18 1301

## 2018-04-04 ENCOUNTER — Other Ambulatory Visit: Payer: Self-pay | Admitting: Family Medicine

## 2018-04-15 DIAGNOSIS — E119 Type 2 diabetes mellitus without complications: Secondary | ICD-10-CM | POA: Diagnosis not present

## 2018-04-15 DIAGNOSIS — H2512 Age-related nuclear cataract, left eye: Secondary | ICD-10-CM | POA: Diagnosis not present

## 2018-04-15 LAB — HM DIABETES EYE EXAM

## 2018-04-19 ENCOUNTER — Encounter: Payer: Self-pay | Admitting: Family Medicine

## 2018-04-26 ENCOUNTER — Ambulatory Visit: Payer: Medicare PPO | Admitting: Family Medicine

## 2018-05-18 ENCOUNTER — Encounter: Payer: Self-pay | Admitting: Internal Medicine

## 2018-05-18 ENCOUNTER — Ambulatory Visit: Payer: Medicare PPO | Admitting: Internal Medicine

## 2018-05-18 VITALS — BP 142/72 | HR 91 | Ht 65.0 in | Wt 258.0 lb

## 2018-05-18 DIAGNOSIS — R0602 Shortness of breath: Secondary | ICD-10-CM

## 2018-05-18 DIAGNOSIS — I10 Essential (primary) hypertension: Secondary | ICD-10-CM

## 2018-05-18 DIAGNOSIS — R079 Chest pain, unspecified: Secondary | ICD-10-CM

## 2018-05-18 DIAGNOSIS — I739 Peripheral vascular disease, unspecified: Secondary | ICD-10-CM | POA: Diagnosis not present

## 2018-05-18 DIAGNOSIS — E118 Type 2 diabetes mellitus with unspecified complications: Secondary | ICD-10-CM | POA: Diagnosis not present

## 2018-05-18 DIAGNOSIS — E785 Hyperlipidemia, unspecified: Secondary | ICD-10-CM | POA: Diagnosis not present

## 2018-05-18 NOTE — Progress Notes (Signed)
OFFICE NOTE  Chief Complaint:  Chest pain  Primary Care Physician: [name], [name], MD  HPI:  Charles [name] is a pleasant 67 year old male who is originally from [place]. He is here for evaluation of progressive shortness of breath. He does have a history of asthma but recently is been having some cough and shortness of breath. He also has a history of diabetes on insulin, dyslipidemia, hypertension, and PAD. He tells me in 1996 he had a painful toe and was found to have rest pain in the office and was admitted for peripheral angiogram. He underwent a balloon angioplasty and ultimately required a stent per his report. We do not have those records available and we'll try to request them. He has not had follow-up since that time including no peripheral Dopplers. He's never had carotid Dopplers. He says in the past he's had a stress test but it's been many years ago. He denies any chest pain but is progressively more short of breath. He's also morbidly obese.  I saw Charles [name] back in the office today. He's recently been very anxious since he's had an episode of chest discomfort. This was after lifting a heavy couch. He says symptoms were sharp and intermittent during the first day and improved after a couple of days. What constant and did not sound anginal in nature. He did undergo nuclear stress test which was negative for ischemia with normal LV function. He had bilateral lower extremity ultrasounds which show patent vessels and no evidence of a stenosis of his previously placed peripheral stent. He also had bilateral carotid artery Dopplers which indicate mild carotid disease.  04/24/2016  Charles [name] returns today for follow-up. He's had some intermittent episodes of sharp chest pain. This is also associated with neck pain and some pain that radiates down his arms. He also reports some loose stool and diarrhea. This was worse on metformin however after discontinuing he still  has symptoms. He does get some dizziness which I suspect is related to neck pain. He denies any claudication. His Dopplers in August of last year showed normal ABIs and mild bilateral carotid disease. This is due for follow-up in August of next year. He is also overdue for a lipid profile.  03/11/2017  Charles [name] was seen today in follow-up. He reports some occasional palpitations. He denies any chest pain or worsening shortness of breath. His diarrhea that he had previously was resolved. He denies any pain with walking. Blood pressure was elevated today however he just recently took his medicines. A repeat blood pressure check came down to 134/80. 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 He is due for repeat lipid profile. He recently was put on oral blood sugar medication which caused him some shortness of breath and other symptoms however he stopped taking it and the symptoms resolved. He denies any claudication.  05/18/2018  Charles [name] was seen today in follow-up.  He saw his primary care provider Charles [name] in January.  At time he was complaining of chest pain.  EKG was performed and was negative.  He was referred back here for follow-up.  He reports today that he continues to have chest discomfort.  Sometimes is worse when moving his neck or lifting his left arm.  He can also have it at rest or with  some exertion.  He is not very physically active.  We had planned to work on weight loss however his weight is essentially unchanged.  We had also previously discussed the possibility of sleep apnea because of significant sleepiness, however he reports that is improved somewhat and feels like it was related to transitioning from working at night to now sleeping at night in his retirement.  Of note he said his brother actually died recently of a massive heart attack  at age 90.  He then mentioned that he was a heavy drug user.  Still, there may be some genetic predisposition to coronary disease.  He has multiple coronary risk factors including type 2 diabetes, hypertension and dyslipidemia as well as morbid obesity.  PMHx:  Past Medical History:  Diagnosis Date  . Abnormal carotid ultrasound 07/2015   mild bilateral stenosis, repeat in 1 year.  Dr. Debara Pickett  . Diabetes mellitus without complication ([place])   . Gout   . Hepatitis C antibody test positive 05/2015   prior infection, resolved  . History of cardiovascular stress test 9/16   Lexiscan, EF 55%, no ischemia, Dr. Debara Pickett  . Hyperlipidemia   . Hypertension   . Noncompliance   . Obesity   . PVD (peripheral vascular disease) ([place]) 07/2015   normal ABIs, Dr. Debara Pickett    Past Surgical History:  Procedure Laterality Date  . BALLOON ANGIOPLASTY, ARTERY N/A also placed stent  . COLONOSCOPY  2016   Dr. Benson Norway    FAMHx:  Family History  Problem Relation Age of Onset  . Asthma Mother   . Heart attack Mother   . Hypertension Mother   . Hyperlipidemia Mother   . Hypertension Father   . Diabetes Father   . HIV Sister   . Liver disease Brother   . Lung cancer Brother   . Hypertension Brother     SOCHx:   reports that he quit smoking about 22 years ago. His smoking use included cigarettes. He has a 30.00 pack-year smoking history. He has never used smokeless tobacco. He reports that he does not drink alcohol or use drugs.  ALLERGIES:  Allergies  Allergen Reactions  . Penicillins Other (See Comments)    Per pt blacked out after injection as a child.     ROS: Pertinent items noted in HPI and remainder of comprehensive ROS otherwise negative.  HOME MEDS: Current Outpatient Medications  Medication Sig Dispense Refill  . ACCU-CHEK FASTCLIX LANCETS MISC Use to check blood sugars 2-3 times daily.    . Alcohol Swabs (B-D SINGLE USE SWABS REGULAR) PADS 1 Device by Does not apply route daily. 90 each 3    . allopurinol (ZYLOPRIM) 300 MG tablet TAKE 1 TABLET (300 MG TOTAL) DAILY. 90 tablet 1  . amLODipine (NORVASC) 10 MG tablet TAKE 1 TABLET EVERY DAY 90 tablet 1  . Blood Glucose Monitoring Suppl (ACCU-CHEK NANO SMARTVIEW) w/Device KIT Use as directed.    Marland Kitchen glucose blood (ACCU-CHEK SMARTVIEW) test strip Use as instructed (Patient taking differently: Use to test blood sugars 2-3 times daily.) 100 each 12  . metFORMIN (GLUCOPHAGE) 500 MG tablet Take 1 tablet (500 mg total) by mouth 2 (two) times daily with a meal. 180 tablet 3  . omeprazole (PRILOSEC OTC) 20 MG tablet Take 20 mg by mouth daily.    . rosuvastatin (CRESTOR) 10 MG tablet Take 1 tablet (10 mg total) by mouth daily. 90 tablet 3   No current facility-administered medications for this visit.  LABS/IMAGING: No results found for this or any previous visit (from the past 48 hour(s)). No results found.  WEIGHTS: Wt Readings from Last 3 Encounters:  05/18/18 258 lb (117 kg)  02/04/18 260 lb (117.9 kg)  12/28/17 260 lb 8 oz (118.2 kg)    VITALS: BP (!) 142/72   Pulse 91   Ht _0  (1.651 m)   Wt 258 lb (117 kg)   BMI 42.93 kg/m   EXAM: General appearance: alert, no distress and morbidly obese Neck: no carotid bruit and no JVD Lungs: clear to auscultation bilaterally Heart: regular rate and rhythm, S1, S2 normal, no murmur, click, rub or gallop Abdomen: morbidly obese Extremities: extremities normal, atraumatic, no cyanosis or edema Pulses: 2+ and symmetric Skin: Skin color, texture, turgor normal. No rashes or lesions Neurologic: Mental status: Alert, oriented, thought content appropriate Psych: Pleasant  EKG: Normal sinus rhythm at 91, right axis deviation-personally reviewed  ASSESSMENT: 1. Progressive chest pain and dyspnea 2. PAD with prior stent to the right leg in 1996 - normal ABIs 3. Asthma 4. Type 2 diabetes 5. Hypertension 6. Dyslipidemia 7. Morbid obesity 8. Cervicalgia/dizziness 9. Elevated EPWSS  at 13  PLAN: 1.   Mr. Skirvin reports progressive chest pain dyspnea.  He has a history of PAD with prior stenting, type 2 diabetes, hypertension, dyslipidemia and morbid obesity.  Given these risk factors and family history of premature cardiac death in his brother at age 15 recently, I am highly concerned about coronary artery disease.  I had recommended heart catheterization however he has some hesitation about the risks of going right heart catheterization.  I also offered stress testing or considered coronary artery CT scan, however heart rate is elevated at rest and we are unlikely to achieve a resting heart rate in the 50s or 60s in order to get good images.  Since he would prefer to take a noninvasive route, we will start with [facility].  If there is any evidence of abnormality, I would have low risk of proceeding on with heart catheterization.  Plan follow-up with me afterwards.  Charles Casino, MD, [facility], [place] Director of the Advanced Lipid Disorders &  Cardiovascular Risk Reduction Clinic Diplomate of the American Board of Clinical Lipidology Attending Cardiologist  Direct Dial: [phone]  Fax: [phone]  Website:  [email or web address] 05/18/2018, 9:42 AM

## 2018-05-18 NOTE — Patient Instructions (Signed)
Your physician has requested that you have a lexiscan myoview. For further information please visit [email or web address] Please follow instruction sheet, as given.  Your physician recommends that you schedule a follow-up appointment with Dr. Rennis Golden - first available      Dr. Rennis Golden has ordered a Lexiscan Myocardial Perfusion Imaging Study.  Please arrive 15 minutes prior to your appointment time for registration and insurance purposes.   The test will take approximately 3 to 4 hours to complete; you may bring reading material.  If someone comes with you to your appointment, they will need to remain in the main lobby due to limited space in the testing area.   How to prepare for your Myocardial Perfusion Test:  Do not eat or drink 3 hours prior to your test, except you may have water.  Do not consume products containing caffeine (regular or decaffeinated) 12 hours prior to your test. (ex: coffee, chocolate, sodas, tea).  Do wear comfortable clothes (no dresses or overalls) and walking shoes, tennis shoes preferred (No heels or open toe shoes are allowed).  Do NOT wear cologne, perfume, aftershave, or lotions (deodorant is allowed).  If you use an inhaler, use it the AM of your test and bring it with you.   If you use a nebulizer, use it the AM of your test.   If these instructions are not followed, your test will have to be rescheduled.

## 2018-05-24 ENCOUNTER — Telehealth (HOSPITAL_COMMUNITY): Payer: Self-pay | Admitting: *Deleted

## 2018-05-24 NOTE — Telephone Encounter (Signed)
Patient given detailed instructions per Myocardial Perfusion Study Information Sheet for the test on 05/26/18 at 0745. Patient notified to arrive 15 minutes early and that it is imperative to arrive on time for appointment to keep from having the test rescheduled.  If you need to cancel or reschedule your appointment, please call the office within 24 hours of your appointment. . Patient verbalized understanding.Arzu Mcgaughey, Adelene IdlerCynthia W

## 2018-05-26 ENCOUNTER — Ambulatory Visit (HOSPITAL_COMMUNITY)
Admission: RE | Admit: 2018-05-26 | Discharge: 2018-05-26 | Disposition: A | Discharge status: Home / Self Care | Payer: Medicare PPO | Source: Ambulatory Visit | Attending: Cardiology | Admitting: Cardiology

## 2018-05-26 DIAGNOSIS — E785 Hyperlipidemia, unspecified: Secondary | ICD-10-CM | POA: Insufficient documentation

## 2018-05-26 DIAGNOSIS — R0602 Shortness of breath: Secondary | ICD-10-CM | POA: Diagnosis not present

## 2018-05-26 DIAGNOSIS — I10 Essential (primary) hypertension: Secondary | ICD-10-CM | POA: Diagnosis not present

## 2018-05-26 DIAGNOSIS — R079 Chest pain, unspecified: Secondary | ICD-10-CM | POA: Insufficient documentation

## 2018-05-26 LAB — MYOCARDIAL PERFUSION IMAGING
CHL CUP NUCLEAR SRS: 0
CHL CUP NUCLEAR SSS: 1
LVDIAVOL: 109 mL (ref 62–150)
LVSYSVOL: 48 mL
Peak HR: 109 {beats}/min
Rest HR: 75 {beats}/min
SDS: 1
TID: 1.08

## 2018-05-26 MED ORDER — REGADENOSON 0.4 MG/5ML IV SOLN
0.4000 mg | Freq: Once | INTRAVENOUS | Status: AC
  Administered 2018-05-26: 0.4 mg via INTRAVENOUS

## 2018-05-26 MED ORDER — AMINOPHYLLINE 25 MG/ML IV SOLN
75.0000 mg | Freq: Once | INTRAVENOUS | Status: AC
  Administered 2018-05-26: 75 mg via INTRAVENOUS

## 2018-05-26 MED ORDER — TECHNETIUM TC 99M TETROFOSMIN IV KIT
31.0000 | PACK | Freq: Once | INTRAVENOUS | Status: AC | PRN
  Administered 2018-05-26: 31 via INTRAVENOUS
  Filled 2018-05-26: qty 31

## 2018-05-26 MED ORDER — TECHNETIUM TC 99M TETROFOSMIN IV KIT
10.8000 | PACK | Freq: Once | INTRAVENOUS | Status: AC | PRN
  Administered 2018-05-26: 10.8 via INTRAVENOUS
  Filled 2018-05-26: qty 11

## 2018-06-06 ENCOUNTER — Other Ambulatory Visit: Payer: Self-pay | Admitting: Family Medicine

## 2018-06-06 ENCOUNTER — Other Ambulatory Visit: Payer: Self-pay | Admitting: *Deleted

## 2018-06-06 ENCOUNTER — Ambulatory Visit (INDEPENDENT_AMBULATORY_CARE_PROVIDER_SITE_OTHER): Payer: Medicare PPO | Admitting: Family Medicine

## 2018-06-06 ENCOUNTER — Encounter: Payer: Self-pay | Admitting: Family Medicine

## 2018-06-06 VITALS — BP 140/88 | HR 92 | Temp 98.8°F | Resp 12 | Ht 65.0 in | Wt 260.4 lb

## 2018-06-06 DIAGNOSIS — I10 Essential (primary) hypertension: Secondary | ICD-10-CM | POA: Diagnosis not present

## 2018-06-06 DIAGNOSIS — E1159 Type 2 diabetes mellitus with other circulatory complications: Secondary | ICD-10-CM

## 2018-06-06 DIAGNOSIS — E1151 Type 2 diabetes mellitus with diabetic peripheral angiopathy without gangrene: Secondary | ICD-10-CM

## 2018-06-06 DIAGNOSIS — E118 Type 2 diabetes mellitus with unspecified complications: Secondary | ICD-10-CM

## 2018-06-06 DIAGNOSIS — E785 Hyperlipidemia, unspecified: Secondary | ICD-10-CM

## 2018-06-06 DIAGNOSIS — Z23 Encounter for immunization: Secondary | ICD-10-CM

## 2018-06-06 DIAGNOSIS — Z6841 Body Mass Index (BMI) 40.0 and over, adult: Secondary | ICD-10-CM

## 2018-06-06 DIAGNOSIS — IMO0002 Reserved for concepts with insufficient information to code with codable children: Secondary | ICD-10-CM

## 2018-06-06 DIAGNOSIS — E1165 Type 2 diabetes mellitus with hyperglycemia: Secondary | ICD-10-CM

## 2018-06-06 LAB — POCT GLYCOSYLATED HEMOGLOBIN (HGB A1C): HEMOGLOBIN A1C: 7.9 % — AB (ref 4.0–5.6)

## 2018-06-06 MED ORDER — DULAGLUTIDE 1.5 MG/0.5ML ~~LOC~~ SOAJ: 1.5000 mg | SUBCUTANEOUS | 1 refills | Status: DC

## 2018-06-06 MED ORDER — IRBESARTAN 75 MG PO TABS: 75.0000 mg | ORAL_TABLET | Freq: Every day | ORAL | 1 refills | Status: DC

## 2018-06-06 MED ORDER — DULAGLUTIDE 0.75 MG/0.5ML ~~LOC~~ SOAJ: 0.7500 mg | SUBCUTANEOUS | 0 refills | Status: DC

## 2018-06-06 MED ORDER — IRBESARTAN 75 MG PO TABS: 75.0000 mg | ORAL_TABLET | Freq: Every day | ORAL | 0 refills | Status: DC

## 2018-06-06 MED ORDER — DULAGLUTIDE 0.75 MG/0.5ML ~~LOC~~ SOAJ: 0.7500 mg | SUBCUTANEOUS | 0 refills | Status: AC

## 2018-06-06 NOTE — Patient Instructions (Addendum)
A few things to remember from today's visit:   Controlled type 2 diabetes mellitus with other circulatory complication, without long-term current use of insulin (HCC) - Plan: POCT glycosylated hemoglobin (Hb A1C), irbesartan (AVAPRO) 75 MG tablet, Dulaglutide (TRULICITY) 0.75 MG/0.5ML SOPN, Dulaglutide (TRULICITY) 1.5 MG/0.5ML SOPN, DISCONTINUED: irbesartan (AVAPRO) 75 MG tablet  Essential hypertension - Plan: irbesartan (AVAPRO) 75 MG tablet, DISCONTINUED: irbesartan (AVAPRO) 75 MG tablet  Need for vaccination against Streptococcus pneumoniae using pneumococcal conjugate vaccine 13 - Plan: Pneumococcal conjugate vaccine 13-valent IM  Start with Trulicity 0.75 mg weekly then increase it to 1.5 mg.  No changes in Metformin.  HgA1C goal < 7.0. Avoid sugar added food:regular soft drinks, energy drinks, and sports drinks. candy. cakes. cookies. pies and cobblers. sweet rolls, pastries, and donuts. fruit drinks, such as fruitades and fruit punch. dairy desserts, such as ice cream  Mediterranean diet has showed benefits for sugar control.  How much and what type of carbohydrate foods are important for managing diabetes. The balance between how much insulin is in your body and the carbohydrate you eat makes a difference in your blood glucose levels.  Fasting blood sugar ideally 130 or less, 2 hours after meals less than 180.   Regular exercise also will help with controlling disease, daily brisk walking as tolerated for 15-30 min definitively will help.   Avoid skipping meals, blood sugar might drop and cause serious problems. Remember checking feet periodically, good dental hygiene, and annual eye exam.     Please be sure medication list is accurate. If a new problem present, please set up appointment sooner than planned today.

## 2018-06-06 NOTE — Progress Notes (Signed)
HPI:   Charles [name] is a 67 y.o. male, who is here today for 4 months follow up.   He was last seen in 12/2017  Since his last office visit he he has follow-up with his cardiologist, Dr. Debara Pickett.  He underwent stress test on 05/26/2018.  The left ventricular ejection fraction is normal (55-65%).  Nuclear stress EF: 56%.  There is a small defect of mild severity present in the apex location. The defect is reversible and could represent a very small area of ischemia vs. Diaphragmatic and soft tissue attenuation artifact.  This is a low risk study.  There was no ST segment deviation noted during stress.  He has also been in the ED on 02/04/2018 due to avulsion of toenail.  Since his last visit he has not changed his diet. He is not exercising regularly due to joint pain, mainly ankles.  Diabetes Mellitus II:   He has not follow with endocrinologist. Currently on metformin 500 mg twice daily.. Problem has been stable. Checking BS's : "Ok", sometimes 200's usually when he eats sweats. When BS in the 80's he feels shaky. 80-120.  Hypoglycemia:No Last eye exam a couple months ago and negative for retinopathy. Dx with dry eye synd.   He is tolerating medications well. He denies abdominal pain, nausea, vomiting, polydipsia, polyuria, or polyphagia. No numbness, tingling, or burning.  He has not been consistent with his diet. Chocolate cake almost daily.   Lab Results  Component Value Date   HGBA1C 6.8 10/01/2017   Lab Results  Component Value Date   MICROALBUR 0.9 06/10/2015    Hypertension:   His brother recently passed from MI.  Currently on Amlodipine He is not checking BP at home. He is taking medications as instructed, no side effects reported.  He has not noted unusual headache, visual changes, exertional chest pain, dyspnea,  focal weakness, or edema.   Lab Results  Component Value Date   CREATININE 1.19 12/28/2017   BUN 18 12/28/2017   NA  140 12/28/2017   K 3.9 12/28/2017   CL 101 12/28/2017   CO2 30 12/28/2017     Review of Systems  Constitutional: Negative for activity change, appetite change, fatigue and fever.  HENT: Negative for nosebleeds, sore throat and trouble swallowing.   Eyes: Negative for redness and visual disturbance.  Respiratory: Negative for apnea, cough, shortness of breath and wheezing.   Cardiovascular: Negative for chest pain, palpitations and leg swelling.  Gastrointestinal: Negative for abdominal pain, nausea and vomiting.  Endocrine: Negative for polydipsia, polyphagia and polyuria.  Genitourinary: Negative for decreased urine volume, dysuria and hematuria.  Musculoskeletal: Positive for arthralgias.  Skin: Negative for rash and wound.  Neurological: Negative for dizziness, weakness and headaches.     Current Outpatient Medications on File Prior to Visit  Medication Sig Dispense Refill  . ACCU-CHEK FASTCLIX LANCETS MISC Use to check blood sugars 2-3 times daily.    . Alcohol Swabs (B-D SINGLE USE SWABS REGULAR) PADS 1 Device by Does not apply route daily. 90 each 3  . allopurinol (ZYLOPRIM) 300 MG tablet TAKE 1 TABLET (300 MG TOTAL) DAILY. 90 tablet 1  . amLODipine (NORVASC) 10 MG tablet TAKE 1 TABLET EVERY DAY 90 tablet 1  . Blood Glucose Monitoring Suppl (ACCU-CHEK NANO SMARTVIEW) w/Device KIT Use as directed.    Marland Kitchen glucose blood (ACCU-CHEK SMARTVIEW) test strip Use as instructed (Patient taking differently: Use to test blood sugars 2-3 times daily.) 100 each  12  . omeprazole (PRILOSEC OTC) 20 MG tablet Take 20 mg by mouth daily.     No current facility-administered medications on file prior to visit.      Past Medical History:  Diagnosis Date  . Abnormal carotid ultrasound 07/2015   mild bilateral stenosis, repeat in 1 year.  Dr. Debara Pickett  . Diabetes mellitus without complication ([place])   . Gout   . Hepatitis C antibody test positive 05/2015   prior infection, resolved  . History of  cardiovascular stress test 9/16   Lexiscan, EF 55%, no ischemia, Dr. Debara Pickett  . Hyperlipidemia   . Hypertension   . Noncompliance   . Obesity   . PVD (peripheral vascular disease) ([place]) 07/2015   normal ABIs, Dr. Debara Pickett   Allergies  Allergen Reactions  . Penicillins Other (See Comments)    Per pt blacked out after injection as a child.     Social History   Socioeconomic History  . Marital status: Divorced    Spouse name: Not on file  . Number of children: Not on file  . Years of education: Not on file  . Highest education level: Not on file  Occupational History  . Not on file  Social Needs  . Financial resource strain: Not on file  . Food insecurity:    Worry: Not on file    Inability: Not on file  . Transportation needs:    Medical: Not on file    Non-medical: Not on file  Tobacco Use  . Smoking status: Former Smoker    Packs/day: 1.00    Years: 30.00    Pack years: 30.00    Types: Cigarettes    Last attempt to quit: 07/31/1995    Years since quitting: 22.8  . Smokeless tobacco: Never Used  Substance and Sexual Activity  . Alcohol use: No    Alcohol/week: 0.0 oz  . Drug use: No  . Sexual activity: Not on file  Lifestyle  . Physical activity:    Days per week: Not on file    Minutes per session: Not on file  . Stress: Not on file  Relationships  . Social connections:    Talks on phone: Not on file    Gets together: Not on file    Attends religious service: Not on file    Active member of club or organization: Not on file    Attends meetings of clubs or organizations: Not on file    Relationship status: Not on file  Other Topics Concern  . Not on file  Social History Narrative  . Not on file    Vitals:   06/06/18 1521  BP: 140/88  Pulse: 92  Resp: 12  Temp: 98.8 F (37.1 C)  SpO2: 96%   Body mass index is 43.33 kg/m.   Wt Readings from Last 3 Encounters:  06/06/18 260 lb 6 oz (118.1 kg)  05/26/18 258 lb (117 kg)  05/18/18 258 lb (117 kg)      Physical Exam  Nursing note and vitals reviewed. Constitutional: He is oriented to person, place, and time. He appears well-developed. No distress.  HENT:  Head: Normocephalic and atraumatic.  Mouth/Throat: Oropharynx is clear and moist and mucous membranes are normal.  Poor dentition.  Eyes: Pupils are equal, round, and reactive to light. Conjunctivae are normal.  Cardiovascular: Normal rate and regular rhythm.  No murmur heard. Pulses:      Dorsalis pedis pulses are 3+ on the right side.  DP pulses  present bilateral.  Respiratory: Effort normal and breath sounds normal. No respiratory distress.  GI: Soft. He exhibits no mass. There is no hepatomegaly. There is no tenderness.  Musculoskeletal: He exhibits no edema.  Lymphadenopathy:    He has no cervical adenopathy.  Neurological: He is alert and oriented to person, place, and time. He has normal strength. Gait normal.  Skin: Skin is warm. No rash noted. No erythema.  Psychiatric: He has a normal mood and affect. Cognition and memory are normal.  Well groomed, good eye contact.      ASSESSMENT AND PLAN:   Mr. FURKAN KEENUM was seen today for 4 months follow-up.  Orders Placed This Encounter  Procedures  . Pneumococcal conjugate vaccine 13-valent IM  . Microalbumin / creatinine urine ratio  . POCT glycosylated hemoglobin (Hb A1C)   Lab Results  Component Value Date   HGBA1C 7.9 (A) 06/06/2018   Lab Results  Component Value Date   MICROALBUR 1.4 06/06/2018    1. Uncontrolled diabetes mellitus type 2 with peripheral artery disease (HCC)  Problem is not well controlled Hg A1c 7.9. No changes in metformin. After discussion of some side effects as well as benefits he agrees with starting Trulicity 8.11 mg weekly and in 4 weeks he will increase to 1.5 mg. Educated about the importance of following dietary recommendations, foot care, periodic eye examination and dental care. Follow-up in 4 to 5 months.  - POCT  glycosylated hemoglobin (Hb A1C) - Microalbumin / creatinine urine ratio  2. Essential hypertension  Mildly elevated. No changes seen Amlodipine. Avapro 75 mg daily added today. If possible I asked him to monitor BP at home. Follow-up in 4 months.  3. Need for vaccination against Streptococcus pneumoniae using pneumococcal conjugate vaccine 13 - Pneumococcal conjugate vaccine 13-valent IM  4. Morbid obesity with BMI of 40.0-44.9, adult [facility])  He is gaining weight steadily. We discussed some benefits of weight loss as well as adverse effects. He has difficulty exercising because joint pain. Recommend limiting sweets intake, he can have small piece of chocolate cake maybe once per month but not daily.    Levada Bowersox G. [place], MD  [facility]. [place] office.

## 2018-06-07 LAB — MICROALBUMIN / CREATININE URINE RATIO
Creatinine,U: 196.3 mg/dL
MICROALB UR: 1.4 mg/dL (ref 0.0–1.9)
Microalb Creat Ratio: 0.7 mg/g (ref 0.0–30.0)

## 2018-07-02 ENCOUNTER — Encounter (HOSPITAL_COMMUNITY): Payer: Self-pay | Admitting: Nurse Practitioner

## 2018-07-02 ENCOUNTER — Emergency Department (HOSPITAL_COMMUNITY)
Admission: EM | Admit: 2018-07-02 | Discharge: 2018-07-02 | Disposition: A | Discharge status: Home / Self Care | Payer: Medicare PPO | Attending: Emergency Medicine | Admitting: Emergency Medicine

## 2018-07-02 DIAGNOSIS — I10 Essential (primary) hypertension: Secondary | ICD-10-CM | POA: Diagnosis not present

## 2018-07-02 DIAGNOSIS — Z79899 Other long term (current) drug therapy: Secondary | ICD-10-CM | POA: Insufficient documentation

## 2018-07-02 DIAGNOSIS — Z87891 Personal history of nicotine dependence: Secondary | ICD-10-CM | POA: Insufficient documentation

## 2018-07-02 DIAGNOSIS — E119 Type 2 diabetes mellitus without complications: Secondary | ICD-10-CM | POA: Diagnosis not present

## 2018-07-02 DIAGNOSIS — H6123 Impacted cerumen, bilateral: Secondary | ICD-10-CM | POA: Diagnosis not present

## 2018-07-02 DIAGNOSIS — J449 Chronic obstructive pulmonary disease, unspecified: Secondary | ICD-10-CM | POA: Diagnosis not present

## 2018-07-02 NOTE — ED Provider Notes (Signed)
[facility] DEPT Provider Note   CSN: [id number] Arrival date & time: 07/02/18  2130     History   Chief Complaint Chief Complaint  Patient presents with  . Ear Problem    HPI Charles [name] is a 67 y.o. male.  Patient presents to the emergency department with a chief complaint of cerumen impaction bilaterally.  Patient states that he tried using over-the-counter drops today.  He has not tried anything additional.  He denies any fever.  Denies any other associated symptoms.  The history is provided by the patient. No language interpreter was used.    Past Medical History:  Diagnosis Date  . Abnormal carotid ultrasound 07/2015   mild bilateral stenosis, repeat in 1 year.  Dr. Debara Pickett  . Diabetes mellitus without complication ([place])   . Gout   . Hepatitis C antibody test positive 05/2015   prior infection, resolved  . History of cardiovascular stress test 9/16   Lexiscan, EF 55%, no ischemia, Dr. Debara Pickett  . Hyperlipidemia   . Hypertension   . Noncompliance   . Obesity   . PVD (peripheral vascular disease) ([place]) 07/2015   normal ABIs, Dr. Debara Pickett    Patient Active Problem List   Diagnosis Date Noted  . Chest pain 05/18/2018  . GERD (gastroesophageal reflux disease) 12/28/2017  . Chronic lower back pain 05/07/2017  . Excessive daytime sleepiness 03/11/2017  . Noncompliance 02/27/2016  . Chronic neck pain 02/27/2016  . AP (abdominal pain) 02/27/2016  . Cervical radiculitis 02/27/2016  . Left hand paresthesia 02/27/2016  . Diabetes mellitus with complication ([place]) 94/49/6759  . Knee pain, acute 11/29/2015  . Need for prophylactic vaccination and inoculation against influenza 11/29/2015  . Cervical pain (neck) 11/29/2015  . Weight gain 11/29/2015  . Primary gout 11/29/2015  . PAD (peripheral artery disease) ([place]) 07/31/2015  . Shortness of breath 07/31/2015  . Asthma 07/31/2015  . Morbid obesity with BMI of 40.0-44.9, adult ([place]) 06/10/2015   . Hyperlipidemia 06/10/2015  . Diabetes type 2, controlled ([place]) 10/23/2014  . Atherosclerosis of aorta ([place]) 10/23/2014  . Acute idiopathic gout of multiple sites 10/23/2014  . Arcus senilis of both eyes 10/23/2014  . Diabetes type 2, uncontrolled ([place]) 10/09/2014  . Essential hypertension 10/09/2014  . Pain in joint, ankle and foot 10/09/2014    Past Surgical History:  Procedure Laterality Date  . BALLOON ANGIOPLASTY, ARTERY N/A also placed stent  . COLONOSCOPY  2016   Dr. Benson Norway        Home Medications    Prior to Admission medications   Medication Sig Start Date End Date Taking? Authorizing Provider  ACCU-CHEK FASTCLIX LANCETS MISC Use to check blood sugars 2-3 times daily.    [provider]  Alcohol Swabs (B-D SINGLE USE SWABS REGULAR) PADS 1 Device by Does not apply route daily. 06/24/17   Renato Shin, MD  allopurinol (ZYLOPRIM) 300 MG tablet TAKE 1 TABLET (300 MG TOTAL) DAILY. 04/04/18   Martinique, [name], MD  amLODipine (NORVASC) 10 MG tablet TAKE 1 TABLET EVERY DAY 04/04/18   Martinique, [name], MD  Blood Glucose Monitoring Suppl (ACCU-CHEK NANO SMARTVIEW) w/Device KIT Use as directed. 07/14/17   [provider]  Dulaglutide (TRULICITY) 1.5 FM/3.8GY SOPN Inject 1.5 mg into the skin once a week. 06/06/18   Martinique, [name], MD  glucose blood (ACCU-CHEK SMARTVIEW) test strip Use as instructed Patient taking differently: Use to test blood sugars 2-3 times daily. 06/24/17   Renato Shin, MD  irbesartan (AVAPRO) 75 MG tablet Take 1 tablet (75 mg total) by mouth daily. 06/06/18   Martinique, [name], MD  metFORMIN (GLUCOPHAGE) 500 MG tablet TAKE 1 TABLET TWICE DAILY WITH A MEAL 06/07/18   Martinique, [name], MD  omeprazole (PRILOSEC OTC) 20 MG tablet Take 20 mg by mouth daily.    [provider]  pantoprazole (PROTONIX) 40 MG tablet TAKE 1 TABLET EVERY DAY (STOP OMEPRAZOLE) 06/07/18   Martinique, [name], MD  rosuvastatin (CRESTOR) 10 MG tablet TAKE 1 TABLET EVERY DAY  06/07/18   Martinique, [name], MD    Family History Family History  Problem Relation Age of Onset  . Asthma Mother   . Heart attack Mother   . Hypertension Mother   . Hyperlipidemia Mother   . Hypertension Father   . Diabetes Father   . HIV Sister   . Liver disease Brother   . Lung cancer Brother   . Hypertension Brother     Social History Social History   Tobacco Use  . Smoking status: Former Smoker    Packs/day: 1.00    Years: 30.00    Pack years: 30.00    Types: Cigarettes    Last attempt to quit: 07/31/1995    Years since quitting: 22.9  . Smokeless tobacco: Never Used  Substance Use Topics  . Alcohol use: No    Alcohol/week: 0.0 oz  . Drug use: No     Allergies   Penicillins   Review of Systems Review of Systems  All other systems reviewed and are negative.    Physical Exam Updated Vital Signs BP (!) 161/89 (BP Location: Right Arm)   Pulse 88   Temp 98.9 F (37.2 C) (Oral)   Resp 18   SpO2 97%   Physical Exam  Constitutional: He is oriented to person, place, and time. He appears well-developed and well-nourished.  HENT:  Head: Normocephalic and atraumatic.  Bilateral cerumen impaction  Eyes: Conjunctivae and EOM are normal.  Neck: Normal range of motion.  Cardiovascular: Normal rate.  Pulmonary/Chest: Effort normal.  Abdominal: He exhibits no distension.  Musculoskeletal: Normal range of motion.  Neurological: He is alert and oriented to person, place, and time.  Skin: Skin is dry.  Psychiatric: He has a normal mood and affect. His behavior is normal. Judgment and thought content normal.  Nursing note and vitals reviewed.    ED Treatments / Results  Labs (all labs ordered are listed, but only abnormal results are displayed) Labs Reviewed - No data to display  EKG None  Radiology No results found.  Procedures Procedures (including critical care time)  Medications Ordered in ED Medications - No data to display   Initial  Impression / Assessment and Plan / ED Course  I have reviewed the triage vital signs and the nursing notes.  Pertinent labs & imaging results that were available during my care of the patient were reviewed by me and considered in my medical decision making (see chart for details).     Patient with bilateral cerumen impaction.  I attempted to mechanically debride the cerumen with no success.  Patient very frustrated.  I have encouraged him to continue to apply over-the-counter cerumen removal drops and to flush his ears daily until resolution.  Final Clinical Impressions(s) / ED Diagnoses   Final diagnoses:  Bilateral impacted cerumen    ED Discharge Orders    None       Montine Circle, PA-C 07/02/18 2317    Regenia Skeeter,  Nicki Reaper, MD 07/02/18 [phone]

## 2018-07-02 NOTE — Discharge Instructions (Signed)
Continue to use the ear wax drops morning and evening.  Irrigate your ear with warm water in the shower.  This will take 1-2 weeks to clear.

## 2018-07-02 NOTE — ED Triage Notes (Signed)
Pt states that his ears especially the right ear is "clogged up," and that OTC wax removals have not helped.

## 2018-07-11 ENCOUNTER — Ambulatory Visit: Payer: Medicare PPO | Admitting: Internal Medicine

## 2018-08-22 ENCOUNTER — Other Ambulatory Visit: Payer: Self-pay | Admitting: Family Medicine

## 2018-08-22 ENCOUNTER — Other Ambulatory Visit: Payer: Self-pay | Admitting: Endocrinology

## 2018-09-09 ENCOUNTER — Ambulatory Visit (INDEPENDENT_AMBULATORY_CARE_PROVIDER_SITE_OTHER): Payer: Medicare PPO | Admitting: Internal Medicine

## 2018-09-09 ENCOUNTER — Encounter: Payer: Self-pay | Admitting: Internal Medicine

## 2018-09-09 VITALS — BP 112/72 | HR 95 | Ht 65.0 in | Wt 260.0 lb

## 2018-09-09 DIAGNOSIS — I739 Peripheral vascular disease, unspecified: Secondary | ICD-10-CM | POA: Diagnosis not present

## 2018-09-09 DIAGNOSIS — E785 Hyperlipidemia, unspecified: Secondary | ICD-10-CM

## 2018-09-09 DIAGNOSIS — I10 Essential (primary) hypertension: Secondary | ICD-10-CM | POA: Diagnosis not present

## 2018-09-09 DIAGNOSIS — R079 Chest pain, unspecified: Secondary | ICD-10-CM

## 2018-09-09 NOTE — Progress Notes (Signed)
OFFICE NOTE  Chief Complaint:  Chest pain  Primary Care Physician: Martinique, [name], MD  HPI:  Charles [name] is a pleasant 67 year old male who is originally from [place]. He is here for evaluation of progressive shortness of breath. He does have a history of asthma but recently is been having some cough and shortness of breath. He also has a history of diabetes on insulin, dyslipidemia, hypertension, and PAD. He tells me in 1996 he had a painful toe and was found to have rest pain in the office and was admitted for peripheral angiogram. He underwent a balloon angioplasty and ultimately required a stent per his report. We do not have those records available and we'll try to request them. He has not had follow-up since that time including no peripheral Dopplers. He's never had carotid Dopplers. He says in the past he's had a stress test but it's been many years ago. He denies any chest pain but is progressively more short of breath. He's also morbidly obese.  I saw Mr. Fatima back in the office today. He's recently been very anxious since he's had an episode of chest discomfort. This was after lifting a heavy couch. He says symptoms were sharp and intermittent during the first day and improved after a couple of days. What constant and did not sound anginal in nature. He did undergo nuclear stress test which was negative for ischemia with normal LV function. He had bilateral lower extremity ultrasounds which show patent vessels and no evidence of a stenosis of his previously placed peripheral stent. He also had bilateral carotid artery Dopplers which indicate mild carotid disease.  04/24/2016  Mr. Scadden returns today for follow-up. He's had some intermittent episodes of sharp chest pain. This is also associated with neck pain and some pain that radiates down his arms. He also reports some loose stool and diarrhea. This was worse on metformin however after discontinuing he still  has symptoms. He does get some dizziness which I suspect is related to neck pain. He denies any claudication. His Dopplers in August of last year showed normal ABIs and mild bilateral carotid disease. This is due for follow-up in August of next year. He is also overdue for a lipid profile.  03/11/2017  Mr. Golphin was seen today in follow-up. He reports some occasional palpitations. He denies any chest pain or worsening shortness of breath. His diarrhea that he had previously was resolved. He denies any pain with walking. Blood pressure was elevated today however he just recently took his medicines. A repeat blood pressure check came down to 134/80. 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 He is due for repeat lipid profile. He recently was put on oral blood sugar medication which caused him some shortness of breath and other symptoms however he stopped taking it and the symptoms resolved. He denies any claudication.  05/18/2018  Mr. Sanden was seen today in follow-up.  He saw his primary care provider Dr. Martinique in January.  At time he was complaining of chest pain.  EKG was performed and was negative.  He was referred back here for follow-up.  He reports today that he continues to have chest discomfort.  Sometimes is worse when moving his neck or lifting his left arm.  He can also have it at rest or with  some exertion.  He is not very physically active.  We had planned to work on weight loss however his weight is essentially unchanged.  We had also previously discussed the possibility of sleep apnea because of significant sleepiness, however he reports that is improved somewhat and feels like it was related to transitioning from working at night to now sleeping at night in his retirement.  Of note he said his brother actually died recently of a massive heart attack  at age 33.  He then mentioned that he was a heavy drug user.  Still, there may be some genetic predisposition to coronary disease.  He has multiple coronary risk factors including type 2 diabetes, hypertension and dyslipidemia as well as morbid obesity.  09/09/2018  Mr. Lege returns today for follow-up.  Overall he is asymptomatic, denying any chest pain or worsening shortness of breath.  He denies any symptoms of claudication.  Blood pressures well controlled today.  He remains overweight.  He is cholesterol is at goal with LDL less than 70 however hemoglobin A1c recently increased up to 7.9.  Needs to make significant changes in his diet and reducing carbohydrates.  He did undergo a stress test in June 2019 which showed minimal reversible ischemia which was thought to be low risk and normal LV function.  Given the fact that he is been asymptomatic since then, I would not pursue any additional invasive work-up.  PMHx:  Past Medical History:  Diagnosis Date  . Abnormal carotid ultrasound 07/2015   mild bilateral stenosis, repeat in 1 year.  Dr. Debara Pickett  . Diabetes mellitus without complication ([place])   . Gout   . Hepatitis C antibody test positive 05/2015   prior infection, resolved  . History of cardiovascular stress test 9/16   Lexiscan, EF 55%, no ischemia, Dr. Debara Pickett  . Hyperlipidemia   . Hypertension   . Noncompliance   . Obesity   . PVD (peripheral vascular disease) ([place]) 07/2015   normal ABIs, Dr. Debara Pickett    Past Surgical History:  Procedure Laterality Date  . BALLOON ANGIOPLASTY, ARTERY N/A also placed stent  . COLONOSCOPY  2016   Dr. Benson Norway    FAMHx:  Family History  Problem Relation Age of Onset  . Asthma Mother   . Heart attack Mother   . Hypertension Mother   . Hyperlipidemia Mother   . Hypertension Father   . Diabetes Father   . HIV Sister   . Liver disease Brother   . Lung cancer Brother   . Hypertension Brother     SOCHx:   reports that he quit smoking about 23  years ago. His smoking use included cigarettes. He has a 30.00 pack-year smoking history. He has never used smokeless tobacco. He reports that he does not drink alcohol or use drugs.  ALLERGIES:  Allergies  Allergen Reactions  . Penicillins Other (See Comments)    Per pt blacked out after injection as a child.     ROS: Pertinent items noted in HPI and remainder of comprehensive ROS otherwise negative.  HOME MEDS: Current Outpatient Medications  Medication Sig Dispense Refill  . ACCU-CHEK FASTCLIX LANCETS MISC Use to check blood sugars 2-3 times daily.    . Alcohol Swabs (B-D SINGLE USE SWABS REGULAR) PADS 1 Device by Does not apply route daily. 90 each 3  . allopurinol (ZYLOPRIM) 300 MG tablet TAKE 1 TABLET EVERY DAY 90 tablet 1  . amLODipine (NORVASC) 10 MG tablet TAKE 1 TABLET EVERY DAY 90 tablet  1  . Blood Glucose Monitoring Suppl (ACCU-CHEK NANO SMARTVIEW) w/Device KIT Use as directed.    Marland Kitchen glucose blood (ACCU-CHEK SMARTVIEW) test strip Use as instructed (Patient taking differently: Use to test blood sugars 2-3 times daily.) 100 each 12  . irbesartan (AVAPRO) 75 MG tablet Take 1 tablet (75 mg total) by mouth daily. 90 tablet 1  . metFORMIN (GLUCOPHAGE) 500 MG tablet TAKE 1 TABLET TWICE DAILY WITH A MEAL 180 tablet 3  . rosuvastatin (CRESTOR) 10 MG tablet TAKE 1 TABLET EVERY DAY 90 tablet 3   No current facility-administered medications for this visit.     LABS/IMAGING: No results found for this or any previous visit (from the past 48 hour(s)). No results found.  WEIGHTS: Wt Readings from Last 3 Encounters:  09/09/18 260 lb (117.9 kg)  06/06/18 260 lb 6 oz (118.1 kg)  05/26/18 258 lb (117 kg)    VITALS: BP 112/72 (BP Location: Left Arm, Patient Position: Sitting, Cuff Size: Large)   Pulse 95   Ht '5\' 5"'$  (1.651 m)   Wt 260 lb (117.9 kg)   BMI 43.27 kg/m   EXAM: General appearance: alert, no distress and morbidly obese Neck: no carotid bruit and no JVD Lungs: clear  to auscultation bilaterally Heart: regular rate and rhythm, S1, S2 normal, no murmur, click, rub or gallop Abdomen: morbidly obese Extremities: extremities normal, atraumatic, no cyanosis or edema Pulses: 2+ and symmetric Skin: Skin color, texture, turgor normal. No rashes or lesions Neurologic: Mental status: Alert, oriented, thought content appropriate Psych: Pleasant  EKG: Emil sinus rhythm 95, possible left atrial enlargement-personally reviewed  ASSESSMENT: 1. Progressive chest pain and dyspnea 2. PAD with prior stent to the right leg in 1996 - normal ABIs 3. Asthma 4. Type 2 diabetes 5. Hypertension 6. Dyslipidemia 7. Morbid obesity 8. Cervicalgia/dizziness 9. Elevated EPWSS at 13  PLAN: 1.   Mr. Wiltgen continues to be stable and asymptomatic.  He denies any recurrent chest pain.  He had a low risk stress test with very minimal area of ischemia versus soft tissue artifact.  We will continue aggressive medical therapy, recommend weight loss and improve glycemic control.  He denies any active claudication.  Plan follow-up in 6 months or sooner as necessary.  Pixie Casino, MD, [facility], [place] Director of the [facility] Lipid [facility]  Card[facility]ovascular [facility] Diplomate of the American Board of Clinical Lipidology Attending Cardiologist  Direct Dial: [phone]  Fax: [phone]  Website:  [email or web address] 09/09/2018, 11:10 AM

## 2018-09-09 NOTE — Patient Instructions (Signed)
Your physician wants you to follow-up in: 6 months with Dr. Hilty. You will receive a reminder letter in the mail two months in advance. If you don't receive a letter, please call our office to schedule the follow-up appointment.    

## 2018-10-07 ENCOUNTER — Ambulatory Visit: Payer: Medicare PPO | Admitting: Family Medicine

## 2018-10-07 ENCOUNTER — Encounter: Payer: Self-pay | Admitting: Family Medicine

## 2018-10-07 VITALS — BP 130/84 | HR 96 | Temp 98.1°F | Resp 12 | Ht 65.0 in | Wt 260.0 lb

## 2018-10-07 DIAGNOSIS — I10 Essential (primary) hypertension: Secondary | ICD-10-CM

## 2018-10-07 DIAGNOSIS — Z6841 Body Mass Index (BMI) 40.0 and over, adult: Secondary | ICD-10-CM

## 2018-10-07 DIAGNOSIS — Z23 Encounter for immunization: Secondary | ICD-10-CM | POA: Diagnosis not present

## 2018-10-07 DIAGNOSIS — E1159 Type 2 diabetes mellitus with other circulatory complications: Secondary | ICD-10-CM | POA: Diagnosis not present

## 2018-10-07 LAB — BASIC METABOLIC PANEL
BUN: 22 mg/dL (ref 6–23)
CALCIUM: 9.9 mg/dL (ref 8.4–10.5)
CHLORIDE: 102 meq/L (ref 96–112)
CO2: 23 meq/L (ref 19–32)
Creatinine, Ser: 1.17 mg/dL (ref 0.40–1.50)
GFR: 80.02 mL/min (ref >60.00)
Glucose, Bld: 144 mg/dL — ABNORMAL HIGH (ref 70–99)
Potassium: 4.1 mEq/L (ref 3.5–5.1)
SODIUM: 141 meq/L (ref 135–145)

## 2018-10-07 LAB — POCT GLYCOSYLATED HEMOGLOBIN (HGB A1C): HEMOGLOBIN A1C: 7.5 % — AB (ref 4.0–5.6)

## 2018-10-07 MED ORDER — GLIMEPIRIDE 2 MG PO TABS: 2.0000 mg | ORAL_TABLET | Freq: Every day | ORAL | 1 refills | Status: DC

## 2018-10-07 NOTE — Assessment & Plan Note (Signed)
Weight has been a stable. We discussed benefits of wt loss as well as adverse effects of obesity. Consistency with healthy diet and physical activity recommended. Encouraged to engage in a regular low impact physical activity. He agrees with stopping eating grits and hash brown for breakfast, continue water intake with meals.

## 2018-10-07 NOTE — Progress Notes (Signed)
HPI:   Mr.Davell R Schnepf is a 67 y.o. male, who is here today for 4 months follow up.   He was last seen on 06/06/2018.  Since his last OV he has seen Dr. Debara Pickett, cardiologist (09/09/2017).  He was also in the ED, diagnosed with bilateral impacted cerumen.  DM 2: Currently he is on metformin 500 mg twice daily. BS:He is not checking it frequently, 160-170's and occasionally 200's.  Last eye exam:03/2018.  Lab Results  Component Value Date   HGBA1C 7.9 (A) 06/06/2018   Lab Results  Component Value Date   MICROALBUR 1.4 06/06/2018   Hypertension:  Currently on amlodipine 10 mg daily and Avapro 75 mg daily. Home BPs:He is not checking BP's  He is taking medications as instructed, no side effects reported. He has not noted unusual headache, visual changes, exertional chest pain, dyspnea,  focal weakness, or edema.   Lab Results  Component Value Date   CREATININE 1.19 12/28/2017   BUN 18 12/28/2017   NA 140 12/28/2017   K 3.9 12/28/2017   CL 101 12/28/2017   CO2 30 12/28/2017    Obesity: Frustrated because he has not lose wt.  Dietary changes since last OV: Decreased portions,skiping lunch,and low salt. States that he eats a "big breakfast." 2 eggs,tunkey sausage, sometimes grits and hash browns. Exercise: None. Hx of OA and back pain, exacerbated by movement,prolonged walking and standing.    Review of Systems  Constitutional: Negative for activity change, appetite change, fatigue and fever.  HENT: Negative for nosebleeds, sore throat and trouble swallowing.   Eyes: Negative for redness and visual disturbance.  Respiratory: Negative for apnea, cough, shortness of breath and wheezing.   Cardiovascular: Negative for chest pain, palpitations and leg swelling.  Gastrointestinal: Negative for abdominal pain, nausea and vomiting.  Endocrine: Negative for polydipsia, polyphagia and polyuria.  Genitourinary: Negative for decreased urine volume, dysuria and  hematuria.  Musculoskeletal: Positive for arthralgias and back pain.  Skin: Negative for rash and wound.  Neurological: Negative for weakness and headaches.    Current Outpatient Medications on File Prior to Visit  Medication Sig Dispense Refill  . ACCU-CHEK FASTCLIX LANCETS MISC Use to check blood sugars 2-3 times daily.    . Alcohol Swabs (B-D SINGLE USE SWABS REGULAR) PADS 1 Device by Does not apply route daily. 90 each 3  . allopurinol (ZYLOPRIM) 300 MG tablet TAKE 1 TABLET EVERY DAY 90 tablet 1  . amLODipine (NORVASC) 10 MG tablet TAKE 1 TABLET EVERY DAY 90 tablet 1  . aspirin 81 MG tablet Take 81 mg by mouth daily.    . Blood Glucose Monitoring Suppl (ACCU-CHEK NANO SMARTVIEW) w/Device KIT Use as directed.    Marland Kitchen glucose blood (ACCU-CHEK SMARTVIEW) test strip Use as instructed (Patient taking differently: Use to test blood sugars 2-3 times daily.) 100 each 12  . irbesartan (AVAPRO) 75 MG tablet Take 1 tablet (75 mg total) by mouth daily. 90 tablet 1  . metFORMIN (GLUCOPHAGE) 500 MG tablet TAKE 1 TABLET TWICE DAILY WITH A MEAL 180 tablet 3  . rosuvastatin (CRESTOR) 10 MG tablet TAKE 1 TABLET EVERY DAY 90 tablet 3   No current facility-administered medications on file prior to visit.      Past Medical History:  Diagnosis Date  . Abnormal carotid ultrasound 07/2015   mild bilateral stenosis, repeat in 1 year.  Dr. Debara Pickett  . Diabetes mellitus without complication ([place])   . Gout   . Hepatitis C  antibody test positive 05/2015   prior infection, resolved  . History of cardiovascular stress test 9/16   Lexiscan, EF 55%, no ischemia, Dr. Debara Pickett  . Hyperlipidemia   . Hypertension   . Noncompliance   . Obesity   . PVD (peripheral vascular disease) ([place]) 07/2015   normal ABIs, Dr. Debara Pickett   Allergies  Allergen Reactions  . Penicillins Other (See Comments)    Per pt blacked out after injection as a child.     Social History   Socioeconomic History  . Marital status: Divorced     Spouse name: Not on file  . Number of children: Not on file  . Years of education: Not on file  . Highest education level: Not on file  Occupational History  . Not on file  Social Needs  . Financial resource strain: Not on file  . Food insecurity:    Worry: Not on file    Inability: Not on file  . Transportation needs:    Medical: Not on file    Non-medical: Not on file  Tobacco Use  . Smoking status: Former Smoker    Packs/day: 1.00    Years: 30.00    Pack years: 30.00    Types: Cigarettes    Last attempt to quit: 07/31/1995    Years since quitting: 23.2  . Smokeless tobacco: Never Used  Substance and Sexual Activity  . Alcohol use: No    Alcohol/week: 0.0 standard drinks  . Drug use: No  . Sexual activity: Not on file  Lifestyle  . Physical activity:    Days per week: Not on file    Minutes per session: Not on file  . Stress: Not on file  Relationships  . Social connections:    Talks on phone: Not on file    Gets together: Not on file    Attends religious service: Not on file    Active member of club or organization: Not on file    Attends meetings of clubs or organizations: Not on file    Relationship status: Not on file  Other Topics Concern  . Not on file  Social History Narrative  . Not on file    Vitals:   10/07/18 0830  BP: 130/84  Pulse: 96  Resp: 12  Temp: 98.1 F (36.7 C)  SpO2: 97%   Body mass index is 43.27 kg/m.   Wt Readings from Last 3 Encounters:  10/07/18 260 lb (117.9 kg)  09/09/18 260 lb (117.9 kg)  06/06/18 260 lb 6 oz (118.1 kg)    Physical Exam  Nursing note and vitals reviewed. Constitutional: He is oriented to person, place, and time. He appears well-developed. No distress.  HENT:  Head: Normocephalic and atraumatic.  Mouth/Throat: Oropharynx is clear and moist and mucous membranes are normal.  Eyes: Pupils are equal, round, and reactive to light. Conjunctivae are normal.  Cardiovascular: Normal rate and regular rhythm.    No murmur heard. Pulses:      Dorsalis pedis pulses are 2+ on the right side, and 2+ on the left side.  Respiratory: Effort normal and breath sounds normal. No respiratory distress.  GI: Soft. He exhibits no mass. There is no hepatomegaly. There is no tenderness.  Musculoskeletal: He exhibits no edema.  Lymphadenopathy:    He has no cervical adenopathy.  Neurological: He is alert and oriented to person, place, and time. He has normal strength. No cranial nerve deficit.  Antalgic gait, it is not assisted.  Skin: Skin  is warm. No erythema.  Psychiatric: He has a normal mood and affect.     Diabetic Foot Exam - Simple   Simple Foot Form Diabetic Foot exam was performed with the following findings:  Yes 10/07/2018  9:14 AM  Visual Inspection See comments:  Yes Sensation Testing Intact to touch and monofilament testing bilaterally:  Yes Pulse Check Posterior Tibialis and Dorsalis pulse intact bilaterally:  Yes Comments Hypertrophic long toenails and calluses bilateral.      ASSESSMENT AND PLAN:   Mr. JAYD CADIEUX was seen today for 4 months follow-up.  Orders Placed This Encounter  Procedures  . Flu vaccine HIGH DOSE PF  . Basic metabolic panel  . POC HgB A1c   Lab Results  Component Value Date   HGBA1C 7.5 (A) 10/07/2018   Lab Results  Component Value Date   CREATININE 1.17 10/07/2018   BUN 22 10/07/2018   NA 141 10/07/2018   K 4.1 10/07/2018   CL 102 10/07/2018   CO2 23 10/07/2018    Type 2 diabetes mellitus with vascular disease ([place]) HgA1C is above goal but has improved. He states that his insurance does not cover Victoza or Farxiga or any new medication in the market. Recommend increasing metformin morning dose from 500 mg to 1000 mg if well-tolerated, continue 1000 mg at bedtime. Amaryl 2 mg before breakfast added. We discussed side effects of medications and the risk of hypoglycemia if he skips meals. Regular exercise and healthy diet with avoidance  of added sugar food intake is an important part of treatment and recommended. Annual eye exam, periodic dental and foot care recommended. F/U in 5 months   Morbid obesity with BMI of 40.0-44.9, adult (HCC) Weight has been a stable. We discussed benefits of wt loss as well as adverse effects of obesity. Consistency with healthy diet and physical activity recommended. Encouraged to engage in a regular low impact physical activity. He agrees with stopping eating grits and hash brown for breakfast, continue water intake with meals.   Essential hypertension Adequately controlled. No changes in current management. Low-salt diet recommended. Eye exam recommended annually. F/U in 5 months, before if needed.       Saanvika Vazques G. Martinique, MD  [facility]. [place] office.

## 2018-10-07 NOTE — Patient Instructions (Addendum)
A few things to remember from today's visit:   Type 2 diabetes mellitus with vascular disease (HCC) - Plan: POC HgB A1c, glimepiride (AMARYL) 2 MG tablet, Basic metabolic panel  Essential hypertension - Plan: Basic metabolic panel  Morbid obesity with BMI of 40.0-44.9, adult (HCC) Amaryl added. Stop grits and hash brown.   Please be sure medication list is accurate. If a new problem present, please set up appointment sooner than planned today.

## 2018-10-07 NOTE — Assessment & Plan Note (Signed)
HgA1C is above goal but has improved. He states that his insurance does not cover Victoza or Farxiga or any new medication in the market. Recommend increasing metformin morning dose from 500 mg to 1000 mg if well-tolerated, continue 1000 mg at bedtime. Amaryl 2 mg before breakfast added. We discussed side effects of medications and the risk of hypoglycemia if he skips meals. Regular exercise and healthy diet with avoidance of added sugar food intake is an important part of treatment and recommended. Annual eye exam, periodic dental and foot care recommended. F/U in 5 months

## 2018-10-07 NOTE — Assessment & Plan Note (Signed)
Adequately controlled. No changes in current management. Low salt diet recommended. Eye exam recommended annually. F/U in 5 months, before if needed.  

## 2018-10-31 ENCOUNTER — Other Ambulatory Visit: Payer: Self-pay | Admitting: Family Medicine

## 2018-10-31 DIAGNOSIS — E1159 Type 2 diabetes mellitus with other circulatory complications: Secondary | ICD-10-CM

## 2018-10-31 DIAGNOSIS — I10 Essential (primary) hypertension: Secondary | ICD-10-CM

## 2019-01-04 ENCOUNTER — Other Ambulatory Visit: Payer: Self-pay | Admitting: Family Medicine

## 2019-01-04 DIAGNOSIS — I10 Essential (primary) hypertension: Secondary | ICD-10-CM

## 2019-01-04 DIAGNOSIS — E1159 Type 2 diabetes mellitus with other circulatory complications: Secondary | ICD-10-CM

## 2019-01-11 ENCOUNTER — Other Ambulatory Visit: Payer: Self-pay | Admitting: Family Medicine

## 2019-01-13 ENCOUNTER — Ambulatory Visit: Payer: Medicare PPO | Admitting: Family Medicine

## 2019-01-13 ENCOUNTER — Encounter: Payer: Self-pay | Admitting: Family Medicine

## 2019-01-13 VITALS — BP 126/82 | HR 100 | Temp 98.8°F | Resp 16 | Ht 65.0 in | Wt 266.1 lb

## 2019-01-13 DIAGNOSIS — Z6841 Body Mass Index (BMI) 40.0 and over, adult: Secondary | ICD-10-CM | POA: Diagnosis not present

## 2019-01-13 DIAGNOSIS — R197 Diarrhea, unspecified: Secondary | ICD-10-CM | POA: Diagnosis not present

## 2019-01-13 DIAGNOSIS — E1159 Type 2 diabetes mellitus with other circulatory complications: Secondary | ICD-10-CM

## 2019-01-13 LAB — COMPREHENSIVE METABOLIC PANEL
ALT: 22 U/L (ref 0–53)
AST: 21 U/L (ref 0–37)
Albumin: 4.3 g/dL (ref 3.5–5.2)
Alkaline Phosphatase: 95 U/L (ref 39–117)
BUN: 15 mg/dL (ref 6–23)
CO2: 25 mEq/L (ref 19–32)
Calcium: 9.8 mg/dL (ref 8.4–10.5)
Chloride: 105 mEq/L (ref 96–112)
Creatinine, Ser: 1.27 mg/dL (ref 0.40–1.50)
GFR: 68.44 mL/min (ref >60.00)
Glucose, Bld: 120 mg/dL — ABNORMAL HIGH (ref 70–99)
Potassium: 3.9 mEq/L (ref 3.5–5.1)
Sodium: 143 mEq/L (ref 135–145)
Total Bilirubin: 0.3 mg/dL (ref 0.2–1.2)
Total Protein: 7.7 g/dL (ref 6.0–8.3)

## 2019-01-13 LAB — TSH: TSH: 3.71 u[IU]/mL (ref 0.35–4.50)

## 2019-01-13 NOTE — Progress Notes (Signed)
ACUTE VISIT   HPI:  Chief Complaint  Patient presents with  . Stomach issues    stools have been soft and runny for almost 1 month    [name] is a 68 y.o. male, who is here today complaining of 3-4 weeks of diarrhea. In average 4-5 stools per day,"running." Today he has not had any stool. Yesterday around this time he already had 2 stools.  No associated fever,chills,abdominal pain,nausea,or vomiting. Negative for urinary symptoms.  Exacerbated by food intake. No alleviating factors identified.  No sick contact or travel. No abx use in the past 3-4 months.  DM II,he is on Metformin, which he has taken for many years.    Lab Results  Component Value Date   CREATININE 1.17 10/07/2018   BUN 22 10/07/2018   NA 141 10/07/2018   K 4.1 10/07/2018   CL 102 10/07/2018   CO2 23 10/07/2018   He would like to try "plant-based diet", he does not understand why he is gaining weight despite the diarrhea. He is not exercising regularly and has not been consistent with a healthful diet.   Review of Systems  Constitutional: Negative for appetite change, fatigue and fever.  HENT: Negative for mouth sores, sore throat and trouble swallowing.   Respiratory: Negative for cough, shortness of breath and wheezing.   Cardiovascular: Negative for palpitations and leg swelling.  Gastrointestinal: Positive for diarrhea. Negative for abdominal pain, blood in stool, nausea and vomiting.  Genitourinary: Negative for decreased urine volume, dysuria and hematuria.  Musculoskeletal: Negative for myalgias.  Skin: Negative for pallor and rash.  Neurological: Negative for dizziness, weakness and headaches.      Current Outpatient Medications on File Prior to Visit  Medication Sig Dispense Refill  . ACCU-CHEK FASTCLIX LANCETS MISC Use to check blood sugars 2-3 times daily.    . Alcohol Swabs (B-D SINGLE USE SWABS REGULAR) PADS 1 Device by Does not apply route daily. 90 each 3    . allopurinol (ZYLOPRIM) 300 MG tablet TAKE 1 TABLET EVERY DAY 90 tablet 1  . amLODipine (NORVASC) 10 MG tablet TAKE 1 TABLET EVERY DAY 90 tablet 1  . aspirin 81 MG tablet Take 81 mg by mouth daily.    . Blood Glucose Monitoring Suppl (ACCU-CHEK NANO SMARTVIEW) w/Device KIT Use as directed.    Marland Kitchen glimepiride (AMARYL) 2 MG tablet Take 1 tablet (2 mg total) by mouth daily before breakfast. 90 tablet 1  . glucose blood (ACCU-CHEK SMARTVIEW) test strip Use as instructed (Patient taking differently: Use to test blood sugars 2-3 times daily.) 100 each 12  . irbesartan (AVAPRO) 75 MG tablet TAKE 1 TABLET EVERY DAY 90 tablet 0  . metFORMIN (GLUCOPHAGE) 500 MG tablet TAKE 1 TABLET TWICE DAILY WITH A MEAL 180 tablet 3  . pantoprazole (PROTONIX) 40 MG tablet TAKE 1 TABLET EVERY DAY (STOP OMEPRAZOLE) 90 tablet 0  . rosuvastatin (CRESTOR) 10 MG tablet TAKE 1 TABLET EVERY DAY 90 tablet 3   No current facility-administered medications on file prior to visit.      Past Medical History:  Diagnosis Date  . Abnormal carotid ultrasound 07/2015   mild bilateral stenosis, repeat in 1 year.  Dr. Debara Pickett  . Diabetes mellitus without complication ([place])   . Gout   . Hepatitis C antibody test positive 05/2015   prior infection, resolved  . History of cardiovascular stress test 9/16   Lexiscan, EF 55%, no ischemia, Dr. Debara Pickett  . Hyperlipidemia   .  Hypertension   . Noncompliance   . Obesity   . PVD (peripheral vascular disease) ([place]) 07/2015   normal ABIs, Dr. Debara Pickett   Allergies  Allergen Reactions  . Penicillins Other (See Comments)    Per pt blacked out after injection as a child.     Social History   Socioeconomic History  . Marital status: Divorced    Spouse name: Not on file  . Number of children: Not on file  . Years of education: Not on file  . Highest education level: Not on file  Occupational History  . Not on file  Social Needs  . Financial resource strain: Not on file  . Food insecurity:     Worry: Not on file    Inability: Not on file  . Transportation needs:    Medical: Not on file    Non-medical: Not on file  Tobacco Use  . Smoking status: Former Smoker    Packs/day: 1.00    Years: 30.00    Pack years: 30.00    Types: Cigarettes    Last attempt to quit: 07/31/1995    Years since quitting: 23.4  . Smokeless tobacco: Never Used  Substance and Sexual Activity  . Alcohol use: No    Alcohol/week: 0.0 standard drinks  . Drug use: No  . Sexual activity: Not on file  Lifestyle  . Physical activity:    Days per week: Not on file    Minutes per session: Not on file  . Stress: Not on file  Relationships  . Social connections:    Talks on phone: Not on file    Gets together: Not on file    Attends religious service: Not on file    Active member of club or organization: Not on file    Attends meetings of clubs or organizations: Not on file    Relationship status: Not on file  Other Topics Concern  . Not on file  Social History Narrative  . Not on file    Vitals:   01/13/19 0859  BP: 126/82  Pulse: 100  Resp: 16  Temp: 98.8 F (37.1 C)  SpO2: 97%   Body mass index is 44.29 kg/m.  Wt Readings from Last 3 Encounters:  01/13/19 266 lb 2 oz (120.7 kg)  10/07/18 260 lb (117.9 kg)  09/09/18 260 lb (117.9 kg)    Physical Exam  Nursing note and vitals reviewed. Constitutional: He is oriented to person, place, and time. He appears well-developed. No distress.  HENT:  Head: Normocephalic and atraumatic.  Mouth/Throat: Oropharynx is clear and moist and mucous membranes are normal.  Eyes: Conjunctivae are normal.  Cardiovascular: Normal rate and regular rhythm.  No murmur heard. Respiratory: Effort normal and breath sounds normal. No respiratory distress.  GI: Soft. Bowel sounds are normal. He exhibits no mass. There is no abdominal tenderness.  Musculoskeletal:        General: No edema.  Neurological: He is alert and oriented to person, place, and time. He  has normal strength.  Skin: Skin is warm. No erythema.  Psychiatric: His mood appears anxious.  Well groomed,goog eye contact.      ASSESSMENT AND PLAN:  [name] was seen today for stomach issues.  Diagnoses and all orders for this visit:  Lab Results  Component Value Date   TSH 3.71 01/13/2019   Lab Results  Component Value Date   CREATININE 1.27 01/13/2019   BUN 15 01/13/2019   NA 143 01/13/2019   K  3.9 01/13/2019   CL 105 01/13/2019   CO2 25 01/13/2019   Lab Results  Component Value Date   ALT 22 01/13/2019   AST 21 01/13/2019   ALKPHOS 95 01/13/2019   BILITOT 0.3 01/13/2019    Diarrhea, unspecified type Improving. Possible etiologies discussed. ? Medication.  Because persistent diarrhea,stool Cx ordered. Adequate hydration.  If work up negative and problem is persistent we could hold Metformin for a few weeks.  -     TSH -     Comprehensive metabolic panel -     Ova and parasite examination; Future -     Stool culture; Future -     Stool culture -     Ova and parasite examination  Type 2 diabetes mellitus with vascular disease ([place]) For now no changes in current management. He does not want to discontinue Metformin for now.  Morbid obesity with BMI of 40.0-44.9, adult ([place]) We discussed benefits of wt loss as well as adverse effects of obesity. Consistency with healthy diet and physical activity recommended. Recommend following a diet he can maintain, a plant based diet could be hard to do so.    Return if symptoms worsen or fail to improve, for Keep next appt.     [name]. Martin[place], MD  [facility]. [place] office.

## 2019-01-13 NOTE — Patient Instructions (Addendum)
A few things to remember from today's visit:   Type 2 diabetes mellitus with vascular disease (HCC)  Diarrhea, unspecified type - Plan: TSH, Comprehensive metabolic panel, Ova and parasite examination, Stool culture  Morbid obesity with BMI of 40.0-44.9, adult (HCC)  Adequate hydration. If diarrhea is persistent we will need to consider holding metformin.

## 2019-01-15 ENCOUNTER — Encounter: Payer: Self-pay | Admitting: Family Medicine

## 2019-01-16 ENCOUNTER — Telehealth: Payer: Self-pay | Admitting: Family Medicine

## 2019-01-16 NOTE — Telephone Encounter (Signed)
Copied from CRM [id number]. Topic: Quick Communication - Lab Results (Clinic Use ONLY) >> Jan 16, 2019 12:38 PM Jobe Gibbon, CMA wrote: Called patient to inform them of their lab results. When patient returns call, triage nurse may disclose results. >> Jan 16, 2019  1:50 PM Zada Girt, Lumin L wrote: NOD unavailable for results.

## 2019-01-16 NOTE — Telephone Encounter (Signed)
Returned call to pt. To give lab results; see result note.

## 2019-01-26 LAB — STOOL CULTURE
MICRO NUMBER:: 128836
MICRO NUMBER:: 128837
MICRO NUMBER:: 128839
SHIGA RESULT:: NOT DETECTED
SPECIMEN QUALITY:: ADEQUATE
SPECIMEN QUALITY:: ADEQUATE
SPECIMEN QUALITY:: ADEQUATE

## 2019-01-26 LAB — OVA AND PARASITE EXAMINATION
CONCENTRATE RESULT:: NONE SEEN
MICRO NUMBER:: 128838
SPECIMEN QUALITY:: ADEQUATE
TRICHROME RESULT:: NONE SEEN

## 2019-01-27 ENCOUNTER — Other Ambulatory Visit: Payer: Self-pay | Admitting: *Deleted

## 2019-01-27 MED ORDER — ACCU-CHEK AVIVA PLUS W/DEVICE KIT: PACK | 2 refills | Status: DC

## 2019-01-27 MED ORDER — GLUCOSE BLOOD VI STRP: ORAL_STRIP | 12 refills | Status: DC

## 2019-02-03 ENCOUNTER — Encounter: Payer: Self-pay | Admitting: Family Medicine

## 2019-02-03 ENCOUNTER — Ambulatory Visit: Payer: Medicare PPO | Admitting: Family Medicine

## 2019-02-03 VITALS — BP 140/80 | HR 99 | Temp 98.6°F | Resp 12 | Ht 65.0 in | Wt 267.2 lb

## 2019-02-03 DIAGNOSIS — K529 Noninfective gastroenteritis and colitis, unspecified: Secondary | ICD-10-CM | POA: Diagnosis not present

## 2019-02-03 DIAGNOSIS — E1159 Type 2 diabetes mellitus with other circulatory complications: Secondary | ICD-10-CM

## 2019-02-03 DIAGNOSIS — I10 Essential (primary) hypertension: Secondary | ICD-10-CM

## 2019-02-03 DIAGNOSIS — H9319 Tinnitus, unspecified ear: Secondary | ICD-10-CM | POA: Diagnosis not present

## 2019-02-03 DIAGNOSIS — H9193 Unspecified hearing loss, bilateral: Secondary | ICD-10-CM

## 2019-02-03 LAB — CBC WITH DIFFERENTIAL/PLATELET
Basophils Absolute: 0.1 10*3/uL (ref 0.0–0.1)
Basophils Relative: 0.7 % (ref 0.0–3.0)
Eosinophils Absolute: 0.3 10*3/uL (ref 0.0–0.7)
Eosinophils Relative: 3.1 % (ref 0.0–5.0)
HCT: 42 % (ref 39.0–52.0)
HEMOGLOBIN: 13.3 g/dL (ref 13.0–17.0)
Lymphocytes Relative: 34 % (ref 12.0–46.0)
Lymphs Abs: 2.8 10*3/uL (ref 0.7–4.0)
MCHC: 31.6 g/dL (ref 30.0–36.0)
MCV: 77.1 fl — ABNORMAL LOW (ref 78.0–100.0)
MONO ABS: 0.6 10*3/uL (ref 0.1–1.0)
Monocytes Relative: 7.5 % (ref 3.0–12.0)
Neutro Abs: 4.5 10*3/uL (ref 1.4–7.7)
Neutrophils Relative %: 54.7 % (ref 43.0–77.0)
Platelets: 225 10*3/uL (ref 150.0–400.0)
RBC: 5.44 Mil/uL (ref 4.22–5.81)
RDW: 17.9 % — ABNORMAL HIGH (ref 11.5–15.5)
WBC: 8.3 10*3/uL (ref 4.0–10.5)

## 2019-02-03 LAB — HEMOGLOBIN A1C: Hgb A1c MFr Bld: 7.8 % — ABNORMAL HIGH (ref 4.6–6.5)

## 2019-02-03 NOTE — Progress Notes (Signed)
HPI:   Charles [name] is a 68 y.o. male, who is here today to follow on recent OV.  Still having diarrhea, he has had problem since 11/2018.  He did not stop metformin but decreased dose for a few days, he did not notice any difference in symptoms.  He does not think metformin is contributing to this problem. Since 6 am today he has had 4 stools (2.5 hours).  Problem aggravated by food intake, urgency. He has not noted blood in stool.  No sick contact,reent trace,or abx use.  Not very often" he had has mild cramps periumbilically,resolved after defecation.  + Stomach "growling." Denies nausea or vomiting.  Denies fever,chills,decreased appetite, or urinary symptoms.   Problem is stable.   Lab Results  Component Value Date   TSH 3.71 01/13/2019   HTN: He is on Amlodipine 10 mg daily and Avapro 75 mg daily. He is not checking BP at home.  Denies severe/frequent headache, visual changes, chest pain, dyspnea, palpitation, claudication, focal weakness, or edema.  Lab Results  Component Value Date   CREATININE 1.27 01/13/2019   BUN 15 01/13/2019   NA 143 01/13/2019   K 3.9 01/13/2019   CL 105 01/13/2019   CO2 25 01/13/2019   DM II: He is on Metformin 500 mg bid and Amaryl 2 mg daily. Denies polydipsia,polyuria, or polyphagia.  BS's sometimes "low" : mid 50's to 80's, causes lightheadedness.  Lab Results  Component Value Date   HGBA1C 7.5 (A) 10/07/2018   C/O "a lot of wt gain" since 09/2018. He is not certain how much wt he has gained,he does not weight at home. He does "not eat that much." He is not exercising due to joint pain.  Today he is concerned about hearing loss, he feels ear "clogged up." States that he has been in the ER c/o similar problem,underweant ear lavage and got worse,water trapped in right ear. Constant. No earache or ear drainage. No Hx pf trauma. Gradual onset.  + Tinnitus,intermitentluy for a while but seems to be more  frequent. He is not sure about aggravating or alleviating factors.  No recent URI or travel.  Review of Systems  Constitutional: Negative for activity change, appetite change, fatigue and fever.  HENT: Positive for hearing loss and tinnitus. Negative for ear discharge, ear pain, nosebleeds, sore throat and trouble swallowing.   Eyes: Negative for redness and visual disturbance.  Respiratory: Negative for cough, shortness of breath and wheezing.   Cardiovascular: Negative for chest pain, palpitations and leg swelling.  Gastrointestinal: Positive for abdominal pain and diarrhea. Negative for blood in stool, nausea and vomiting.  Endocrine: Negative for polydipsia, polyphagia and polyuria.  Genitourinary: Negative for decreased urine volume, dysuria and hematuria.  Musculoskeletal: Positive for arthralgias. Negative for myalgias.  Skin: Negative for rash and wound.  Neurological: Positive for light-headedness. Negative for syncope, weakness and headaches.  Psychiatric/Behavioral: Negative for confusion. The patient is nervous/anxious.     Current Outpatient Medications on File Prior to Visit  Medication Sig Dispense Refill  . ACCU-CHEK FASTCLIX LANCETS MISC Use to check blood sugars 2-3 times daily.    . Alcohol Swabs (B-D SINGLE USE SWABS REGULAR) PADS 1 Device by Does not apply route daily. 90 each 3  . allopurinol (ZYLOPRIM) 300 MG tablet TAKE 1 TABLET EVERY DAY 90 tablet 1  . amLODipine (NORVASC) 10 MG tablet TAKE 1 TABLET EVERY DAY 90 tablet 1  . aspirin 81 MG tablet Take 81 mg  by mouth daily.    . Blood Glucose Monitoring Suppl (ACCU-CHEK AVIVA PLUS) w/Device KIT Use to test blood sugar daily 1 kit 2  . Blood Glucose Monitoring Suppl (ACCU-CHEK NANO SMARTVIEW) w/Device KIT Use as directed.    Marland Kitchen glimepiride (AMARYL) 2 MG tablet Take 1 tablet (2 mg total) by mouth daily before breakfast. 90 tablet 1  . glucose blood (ACCU-CHEK AVIVA PLUS) test strip Use as instructed 100 each 12  .  irbesartan (AVAPRO) 75 MG tablet TAKE 1 TABLET EVERY DAY 90 tablet 0  . metFORMIN (GLUCOPHAGE) 500 MG tablet TAKE 1 TABLET TWICE DAILY WITH A MEAL 180 tablet 3  . rosuvastatin (CRESTOR) 10 MG tablet TAKE 1 TABLET EVERY DAY 90 tablet 3   No current facility-administered medications on file prior to visit.      Past Medical History:  Diagnosis Date  . Abnormal carotid ultrasound 07/2015   mild bilateral stenosis, repeat in 1 year.  Dr. Debara Pickett  . Diabetes mellitus without complication ([place])   . Gout   . Hepatitis C antibody test positive 05/2015   prior infection, resolved  . History of cardiovascular stress test 9/16   Lexiscan, EF 55%, no ischemia, Dr. Debara Pickett  . Hyperlipidemia   . Hypertension   . Noncompliance   . Obesity   . PVD (peripheral vascular disease) ([place]) 07/2015   normal ABIs, Dr. Debara Pickett   Allergies  Allergen Reactions  . Penicillins Other (See Comments)    Per pt blacked out after injection as a child.     Social History   Socioeconomic History  . Marital status: Divorced    Spouse name: Not on file  . Number of children: Not on file  . Years of education: Not on file  . Highest education level: Not on file  Occupational History  . Not on file  Social Needs  . Financial resource strain: Not on file  . Food insecurity:    Worry: Not on file    Inability: Not on file  . Transportation needs:    Medical: Not on file    Non-medical: Not on file  Tobacco Use  . Smoking status: Former Smoker    Packs/day: 1.00    Years: 30.00    Pack years: 30.00    Types: Cigarettes    Last attempt to quit: 07/31/1995    Years since quitting: 23.5  . Smokeless tobacco: Never Used  Substance and Sexual Activity  . Alcohol use: No    Alcohol/week: 0.0 standard drinks  . Drug use: No  . Sexual activity: Not on file  Lifestyle  . Physical activity:    Days per week: Not on file    Minutes per session: Not on file  . Stress: Not on file  Relationships  . Social  connections:    Talks on phone: Not on file    Gets together: Not on file    Attends religious service: Not on file    Active member of club or organization: Not on file    Attends meetings of clubs or organizations: Not on file    Relationship status: Not on file  Other Topics Concern  . Not on file  Social History Narrative  . Not on file    Vitals:   02/03/19 0923  BP: 140/80  Pulse: 99  Resp: 12  Temp: 98.6 F (37 C)  SpO2: 97%   Body mass index is 44.46 kg/m.  Wt Readings from Last 3 Encounters:  02/03/19  267 lb 3.2 oz (121.2 kg)  01/13/19 266 lb 2 oz (120.7 kg)  10/07/18 260 lb (117.9 kg)     Physical Exam  Nursing note and vitals reviewed. Constitutional: He is oriented to person, place, and time. He appears well-developed. No distress.  HENT:  Head: Normocephalic and atraumatic.  Right Ear: Decreased hearing is noted.  Left Ear: Decreased hearing is noted.  Mouth/Throat: Oropharynx is clear and moist and mucous membranes are normal.  Unable to see TM bilateral due to cerumen excess.  Eyes: Pupils are equal, round, and reactive to light. Conjunctivae are normal.  Cardiovascular: Normal rate and regular rhythm.  No murmur heard. Pulses:      Dorsalis pedis pulses are 2+ on the right side and 2+ on the left side.  Respiratory: Effort normal and breath sounds normal. No respiratory distress.  GI: Soft. He exhibits no mass. There is no hepatomegaly. There is no abdominal tenderness.  Musculoskeletal:        General: Edema (1+ pitting LE edema, bilateral.) present.  Lymphadenopathy:    He has no cervical adenopathy.  Neurological: He is alert and oriented to person, place, and time. He has normal strength. No cranial nerve deficit.  Stable gait with no assistance.  Skin: Skin is warm. No rash noted. No erythema.  Psychiatric: His mood appears anxious.  Well groomed, good eye contact.    ASSESSMENT AND PLAN:  Mr. Giovan was seen today for lab results and  nausea.  Orders Placed This Encounter  Procedures  . CBC with Differential/Platelet  . Hemoglobin A1c  . Ambulatory referral to Gastroenterology  . Ambulatory referral to ENT   Lab Results  Component Value Date   HGBA1C 7.8 (H) 02/03/2019   Lab Results  Component Value Date   WBC 8.3 02/03/2019   HGB 13.3 02/03/2019   HCT 42.0 02/03/2019   MCV 77.1 (L) 02/03/2019   PLT 225.0 02/03/2019    Chronic diarrhea Stool Cx and parasite & ova negative. ? IBS. Last colonoscopy 2015.  -     Ambulatory referral to Gastroenterology -     CBC with Differential/Platelet  Bilateral hearing loss, unspecified hearing loss type Cerumen impaction. Ear lavage not successful, ENT evaluation recommended.  -     Ambulatory referral to ENT  Tinnitus, unspecified laterality Educated about Dx. Explained that it is usually benign and no effective treatments are available.  -     Ambulatory referral to ENT  Type 2 diabetes mellitus with vascular disease ([place]) HgA1C pending today. No changes in current management for now.  Regular exercise and healthy diet with avoidance of added sugar food intake is an important part of treatment and recommended. Annual eye exam, periodic dental and foot care recommended. F/U in 5-6 months  -     Hemoglobin A1c  Essential hypertension SBP slightly elevated today. No changes in current management. Monitor BP at home. Low salt diet recommended.  -     CBC with Differential/Platelet  Morbid obesity ([place]) Since 09/2018 he has gained 6-7 lb Recommend keeping a food diary.     Return in about 5 months (around 07/04/2019) for DM II,HTN.    [name]. Martinique, MD  [facility]. [place] office.

## 2019-02-03 NOTE — Patient Instructions (Addendum)
A few things to remember from today's visit:   Chronic diarrhea - Plan: Ambulatory referral to Gastroenterology, CBC with Differential/Platelet  Bilateral hearing loss, unspecified hearing loss type - Plan: Ambulatory referral to ENT  Tinnitus, unspecified laterality - Plan: Ambulatory referral to ENT  Type 2 diabetes mellitus with vascular disease ([facility]) - Plan: Hemoglobin A1c  Essential hypertension - Plan: CBC with Differential/Platelet  Morbid obesity (H[facility])  Diarrhea could be related to metformin and/or irritable bowel syndrome.  You can try over-the-counter Metamucil. Stay adequately hydrated.  We will be arranging appointment with gastroenterologist.  Avoid Q-tips in your ears, we will be arranging appointment with ENT.  Please keep a food diary. No changes in blood pressure medication, monitor blood pressure at home.  Please be sure medication list is accurate. If a new problem present, please set up appointment sooner than planned today.

## 2019-02-17 ENCOUNTER — Other Ambulatory Visit: Payer: Self-pay | Admitting: *Deleted

## 2019-02-17 MED ORDER — GLIMEPIRIDE 4 MG PO TABS: 4.0000 mg | ORAL_TABLET | Freq: Every day | ORAL | 3 refills | Status: DC

## 2019-03-20 ENCOUNTER — Encounter: Payer: Self-pay | Admitting: Family Medicine

## 2019-03-27 ENCOUNTER — Other Ambulatory Visit: Payer: Self-pay | Admitting: Family Medicine

## 2019-03-27 DIAGNOSIS — E118 Type 2 diabetes mellitus with unspecified complications: Secondary | ICD-10-CM

## 2019-03-27 DIAGNOSIS — E785 Hyperlipidemia, unspecified: Secondary | ICD-10-CM

## 2019-03-31 ENCOUNTER — Other Ambulatory Visit: Payer: Self-pay | Admitting: Family Medicine

## 2019-04-14 ENCOUNTER — Other Ambulatory Visit: Payer: Self-pay | Admitting: Family Medicine

## 2019-04-14 DIAGNOSIS — M109 Gout, unspecified: Secondary | ICD-10-CM | POA: Diagnosis not present

## 2019-04-14 DIAGNOSIS — I10 Essential (primary) hypertension: Secondary | ICD-10-CM | POA: Diagnosis not present

## 2019-04-14 DIAGNOSIS — Z6841 Body Mass Index (BMI) 40.0 and over, adult: Secondary | ICD-10-CM | POA: Diagnosis not present

## 2019-04-14 DIAGNOSIS — E1159 Type 2 diabetes mellitus with other circulatory complications: Secondary | ICD-10-CM

## 2019-04-14 DIAGNOSIS — E785 Hyperlipidemia, unspecified: Secondary | ICD-10-CM | POA: Diagnosis not present

## 2019-04-14 DIAGNOSIS — Z7984 Long term (current) use of oral hypoglycemic drugs: Secondary | ICD-10-CM | POA: Diagnosis not present

## 2019-04-14 DIAGNOSIS — E119 Type 2 diabetes mellitus without complications: Secondary | ICD-10-CM | POA: Diagnosis not present

## 2019-05-17 ENCOUNTER — Other Ambulatory Visit: Payer: Self-pay | Admitting: Family Medicine

## 2019-06-08 ENCOUNTER — Other Ambulatory Visit: Payer: Self-pay | Admitting: Family Medicine

## 2019-06-08 DIAGNOSIS — I10 Essential (primary) hypertension: Secondary | ICD-10-CM

## 2019-06-08 DIAGNOSIS — E1159 Type 2 diabetes mellitus with other circulatory complications: Secondary | ICD-10-CM

## 2019-06-16 ENCOUNTER — Telehealth: Payer: Self-pay | Admitting: Internal Medicine

## 2019-06-16 NOTE — Telephone Encounter (Signed)
New Message     Left message for pt to confirm appt and answer COVID Questions

## 2019-06-19 ENCOUNTER — Ambulatory Visit: Payer: Medicare PPO | Admitting: Internal Medicine

## 2019-08-12 ENCOUNTER — Other Ambulatory Visit: Payer: Self-pay | Admitting: Family Medicine

## 2019-08-16 ENCOUNTER — Other Ambulatory Visit: Payer: Self-pay | Admitting: Family Medicine

## 2019-08-16 DIAGNOSIS — E1159 Type 2 diabetes mellitus with other circulatory complications: Secondary | ICD-10-CM

## 2019-08-16 DIAGNOSIS — I10 Essential (primary) hypertension: Secondary | ICD-10-CM

## 2019-08-24 ENCOUNTER — Other Ambulatory Visit: Payer: Self-pay | Admitting: *Deleted

## 2019-08-24 ENCOUNTER — Telehealth: Payer: Self-pay

## 2019-08-24 DIAGNOSIS — E118 Type 2 diabetes mellitus with unspecified complications: Secondary | ICD-10-CM

## 2019-08-24 MED ORDER — METFORMIN HCL 500 MG PO TABS: ORAL_TABLET | ORAL | 0 refills | Status: DC

## 2019-08-24 NOTE — Telephone Encounter (Signed)
Clinic RN spoke with patient. Refilled rx for 30 day supply according to directions listed. Informed patient he was supposed to have a f/u appointment with Dr. Martinique 5-6 months after his visit 01/2019. Virtual f/u visit made for 08/29/2019 at [facility]. Informed patient he  Can discuss new directions with PCP at that time so changes can be reflected. Patient verbalized understanding.

## 2019-08-24 NOTE — Telephone Encounter (Signed)
Patient called in stating he is wanting his medication today, as he is started to become frantic. Patient is asking if he must go to hospital if he does not get medication. Please advise.

## 2019-08-24 NOTE — Telephone Encounter (Signed)
OV 02/03/19 notes no changes in current DM medications. Please advise.

## 2019-08-24 NOTE — Telephone Encounter (Signed)
Copied from [place] [id number]. Topic: General - Other >> Aug 24, 2019  8:55 AM Leward Quan A wrote: Reason for CRM: Patient called to say that he ran out of his metFORMIN (GLUCOPHAGE) 500 MG tablet this morning per patient the doctor upped his dose to 1 tab in the morning and two at night but never sent a new Rx to the pharmacy. Pateint is asking for a 30 day supply sent to the local [facility] on file then his regular order sent to the [place]. He would like a call back when done so that he can go and pick this up for tonight. Ph#  [phone]

## 2019-08-25 NOTE — Telephone Encounter (Signed)
Noted  

## 2019-08-29 ENCOUNTER — Other Ambulatory Visit: Payer: Self-pay

## 2019-08-29 ENCOUNTER — Telehealth (INDEPENDENT_AMBULATORY_CARE_PROVIDER_SITE_OTHER): Payer: Medicare PPO | Admitting: Family Medicine

## 2019-08-29 DIAGNOSIS — E1159 Type 2 diabetes mellitus with other circulatory complications: Secondary | ICD-10-CM

## 2019-08-29 DIAGNOSIS — R197 Diarrhea, unspecified: Secondary | ICD-10-CM | POA: Diagnosis not present

## 2019-08-29 DIAGNOSIS — I10 Essential (primary) hypertension: Secondary | ICD-10-CM | POA: Diagnosis not present

## 2019-08-29 DIAGNOSIS — E785 Hyperlipidemia, unspecified: Secondary | ICD-10-CM | POA: Diagnosis not present

## 2019-08-29 DIAGNOSIS — M1A00X0 Idiopathic chronic gout, unspecified site, without tophus (tophi): Secondary | ICD-10-CM | POA: Diagnosis not present

## 2019-08-29 MED ORDER — METFORMIN HCL 500 MG PO TABS: ORAL_TABLET | ORAL | 2 refills | Status: DC

## 2019-08-29 MED ORDER — DOXEPIN HCL 10 MG/ML PO CONC: ORAL | 1 refills | Status: DC

## 2019-08-29 NOTE — Progress Notes (Signed)
Virtual Visit via Video Note   I connected with Charles [name] on 08/29/19 by a video enabled telemedicine application and verified that I am speaking with the correct person using two identifiers.  Location patient: home Location provider:work or home office Persons participating in the virtual visit: patient, provider  I discussed the limitations of evaluation and management by telemedicine and the availability of in person appointments. The patient expressed understanding and agreed to proceed.   HPI: Charles. Charles [name] is a 68 yo male with hx of DM II,gout,obesity,HTN,and HLD among some following on chronic health problems. He was last seen on 02/03/19.  DM II: He is on Metformin 500 mg, 1 tab am and 2 tabs pm and Glimepiride 4 mg daily. She is tolerating meds well. Denies polydipsia,polyuria, or polyphagia.  BS is "ok." Today afternoon 108 and yesterday FG 94 Some FG readings for the past 2 weeks:235,156,85,96.  Lab Results  Component Value Date   HGBA1C 7.8 (H) 02/03/2019   He has had BS's 60's x 4 monthly.  He feels shaky, alleviate symptoms a piece of candy. This happens when he skips meals.  HTN: He does not have a BP monitor at home. He is on Irbesartan 75 mg daily and Amlodipine 10 mg daily. He has Hx of SOB exacerbated by exertion and has been stable. Denies severe/frequent headache, visual changes, chest pain, dyspnea, palpitation, claudication, focal weakness, or edema.  Negative for orthopnea or PND.  Myocardial perfusion 05/2018 He feels like he is eating less. He is not checking BP, he does not have a BP monitor. Denies severe/frequent headache, visual changes, chest pain,palpitation, focal weakness, or edema.  Lab Results  Component Value Date   CREATININE 1.27 01/13/2019   BUN 15 01/13/2019   NA 143 01/13/2019   K 3.9 01/13/2019   CL 105 01/13/2019   CO2 25 01/13/2019   HLD: He is taking Crestor 10 mg daily. Tying to be better with following low fat diet.  Lab  Results  Component Value Date   CHOL 105 12/28/2017   HDL 40.20 12/28/2017   LDLCALC 50 12/28/2017   TRIG 75.0 12/28/2017   CHOLHDL 3 12/28/2017    Complaining of loose stools. This is a chronic problem. Last colonoscopy 11/2014, he does not remember if he was having diarrhea at that time. Tubular adenoma, 5 years f/u was recommended.  Referral to GI was placed in 01/2019.  He does not remember having appt information.  He denies associated abdominal pain, blood in the stool, melena, or abnormal weight loss. He has not identified exacerbating factors. + Urgency and occasional fecal incontinence.  Lab Results  Component Value Date   TSH 3.71 01/13/2019   Lab Results  Component Value Date   WBC 8.3 02/03/2019   HGB 13.3 02/03/2019   HCT 42.0 02/03/2019   MCV 77.1 (L) 02/03/2019   PLT 225.0 02/03/2019   Gout: He is on Allopurinol 300 mg daily. He has not had an episode in a while.  Lab Results  Component Value Date   LABURIC 4.3 08/13/2017     ROS: See pertinent positives and negatives per HPI.  Past Medical History:  Diagnosis Date  . Abnormal carotid ultrasound 07/2015   mild bilateral stenosis, repeat in 1 year.  Dr. Debara Pickett  . Diabetes mellitus without complication ([place])   . Gout   . Hepatitis C antibody test positive 05/2015   prior infection, resolved  . History of cardiovascular stress test 9/16   Lexiscan, EF 55%,  no ischemia, Dr. Debara Pickett  . Hyperlipidemia   . Hypertension   . Noncompliance   . Obesity   . PVD (peripheral vascular disease) ([place]) 07/2015   normal ABIs, Dr. Debara Pickett    Past Surgical History:  Procedure Laterality Date  . BALLOON ANGIOPLASTY, ARTERY N/A also placed stent  . COLONOSCOPY  2016   Dr. Benson [place]    Family History  Problem Relation Age of Onset  . Asthma Mother   . Heart attack Mother   . Hypertension Mother   . Hyperlipidemia Mother   . Hypertension Father   . Diabetes Father   . HIV Sister   . Liver disease Brother   .  Lung cancer Brother   . Hypertension Brother     Social History   Socioeconomic History  . Marital status: Divorced    Spouse name: Not on file  . Number of children: Not on file  . Years of education: Not on file  . Highest education level: Not on file  Occupational History  . Not on file  Social Needs  . Financial resource strain: Not on file  . Food insecurity    Worry: Not on file    Inability: Not on file  . Transportation needs    Medical: Not on file    Non-medical: Not on file  Tobacco Use  . Smoking status: Former Smoker    Packs/day: 1.00    Years: 30.00    Pack years: 30.00    Types: Cigarettes    Quit date: 07/31/1995    Years since quitting: 24.1  . Smokeless tobacco: Never Used  Substance and Sexual Activity  . Alcohol use: No    Alcohol/week: 0.0 standard drinks  . Drug use: No  . Sexual activity: Not on file  Lifestyle  . Physical activity    Days per week: Not on file    Minutes per session: Not on file  . Stress: Not on file  Relationships  . Social Herbalist on phone: Not on file    Gets together: Not on file    Attends religious service: Not on file    Active member of club or organization: Not on file    Attends meetings of clubs or organizations: Not on file    Relationship status: Not on file  . Intimate partner violence    Fear of current or ex partner: Not on file    Emotionally abused: Not on file    Physically abused: Not on file    Forced sexual activity: Not on file  Other Topics Concern  . Not on file  Social History Narrative  . Not on file    Current Outpatient Medications:  .  ACCU-CHEK FASTCLIX LANCETS MISC, Use to check blood sugars 2-3 times daily., Disp: , Rfl:  .  Alcohol Swabs (B-D SINGLE USE SWABS REGULAR) PADS, 1 Device by Does not apply route daily., Disp: 90 each, Rfl: 3 .  allopurinol (ZYLOPRIM) 300 MG tablet, TAKE 1 TABLET EVERY DAY, Disp: 90 tablet, Rfl: 1 .  amLODipine (NORVASC) 10 MG tablet, TAKE  1 TABLET EVERY DAY, Disp: 90 tablet, Rfl: 1 .  aspirin 81 MG tablet, Take 81 mg by mouth daily., Disp: , Rfl:  .  Blood Glucose Monitoring Suppl (ACCU-CHEK AVIVA PLUS) w/Device KIT, Use to test blood sugar daily, Disp: 1 kit, Rfl: 2 .  Blood Glucose Monitoring Suppl (ACCU-CHEK NANO SMARTVIEW) w/Device KIT, Use as directed., Disp: , Rfl:  .  doxepin (SINEQUAN) 10 MG/ML solution, 3 mg daily at bedtime , increase dose to 6 mg in 5 days if needed and then to 10 mg in 5 days if needed., Disp: 30 mL, Rfl: 1 .  glimepiride (AMARYL) 4 MG tablet, TAKE 1 TABLET EVERY DAY BEFORE BREAKFAST, Disp: 90 tablet, Rfl: 1 .  glucose blood (ACCU-CHEK AVIVA PLUS) test strip, Use as instructed, Disp: 100 each, Rfl: 12 .  irbesartan (AVAPRO) 75 MG tablet, TAKE 1 TABLET EVERY DAY, Disp: 90 tablet, Rfl: 0 .  metFORMIN (GLUCOPHAGE) 500 MG tablet, 1 tab am and 2 tabs with supper., Disp: 180 tablet, Rfl: 2 .  rosuvastatin (CRESTOR) 10 MG tablet, TAKE 1 TABLET EVERY DAY, Disp: 90 tablet, Rfl: 3  EXAM:  VITALS per patient if applicable:He does not have a BP monitor.  GENERAL: alert, oriented, appears well and in no acute distress  HEENT: atraumatic, conjunctiva clear, no obvious abnormalities on inspection.  NECK: normal movements of the head and neck  LUNGS: on inspection no signs of respiratory distress, breathing rate appears normal, no obvious gross SOB, gasping or wheezing  CV: no obvious cyanosis  MS: moves all visible extremities without noticeable abnormality  PSYCH/NEURO: pleasant and cooperative, no obvious depression or anxiety, speech and thought processing grossly intact  ASSESSMENT AND PLAN:  Discussed the following assessment and plan:  Type 2 diabetes mellitus with vascular disease ([place]) - Plan: metFORMIN (GLUCOPHAGE) 500 MG tablet, Hemoglobin A1c, Microalbumin / creatinine urine ratio Last HgA1C was not at goal. No changes in current management for now,will adjust treatment according to next  A1C. Regular exercise and healthy diet with avoidance of added sugar food intake is an important part of treatment and recommended. Side effects of medications, strongly recommend avoiding skipping meals. Annual eye exam, periodic dental and foot care recommended. F/U in 5-6 months  Essential hypertension No changes in current management. Low salt diet. Recommend monitoring BP at home.  Hyperlipidemia, unspecified hyperlipidemia type - Plan: Lipid panel Continue Atorvastatin 10 mg daily and low fat diet. We will arrange fasting labs.  Diarrhea, unspecified type - Plan: doxepin (SINEQUAN) 10 MG/ML solution Chronic. Not sure what happened with GI appt but he prefers to hold on referral. We discussed possible etiologies, ? IBS,meds (Allopurinol) among some to consider. He agrees with trying Doxepin,side effects discussed. Doxepin 3 mg daily at bedtime and he can titrate up to 6 mg and 10 mg as tolerated for diarrhea.  Idiopathic chronic gout without tophus, unspecified site - Plan: Uric acid Problem is well controlled. Continue low purine diet. Will check uric acid with next blood work.    I discussed the assessment and treatment plan with the patient. He was provided an opportunity to ask questions and all were answered. He agreed with the plan and demonstrated an understanding of the instructions.    Return in about 4 months (around 12/29/2019) for lab apt next week (Friday iderally and fasting). F/U in 3-4 months..    Mackenzy Eisenberg Martinique, MD

## 2019-08-31 MED ORDER — GLIMEPIRIDE 4 MG PO TABS: ORAL_TABLET | ORAL | 1 refills | Status: DC

## 2019-09-01 NOTE — Progress Notes (Signed)
I have scheduled that patient for labs 09/15 and for an office visit with Dr. Martinique on 12/01.

## 2019-09-04 ENCOUNTER — Ambulatory Visit (INDEPENDENT_AMBULATORY_CARE_PROVIDER_SITE_OTHER): Payer: Medicare PPO | Admitting: Internal Medicine

## 2019-09-04 ENCOUNTER — Telehealth: Payer: Self-pay | Admitting: Family Medicine

## 2019-09-04 ENCOUNTER — Other Ambulatory Visit: Payer: Self-pay | Admitting: Internal Medicine

## 2019-09-04 ENCOUNTER — Other Ambulatory Visit: Payer: Self-pay

## 2019-09-04 VITALS — BP 140/90 | HR 111 | Ht 65.0 in | Wt 264.0 lb

## 2019-09-04 DIAGNOSIS — J45909 Unspecified asthma, uncomplicated: Secondary | ICD-10-CM

## 2019-09-04 DIAGNOSIS — I10 Essential (primary) hypertension: Secondary | ICD-10-CM

## 2019-09-04 DIAGNOSIS — R0602 Shortness of breath: Secondary | ICD-10-CM | POA: Diagnosis not present

## 2019-09-04 DIAGNOSIS — R000 Tachycardia, unspecified: Secondary | ICD-10-CM

## 2019-09-04 DIAGNOSIS — G4719 Other hypersomnia: Secondary | ICD-10-CM

## 2019-09-04 MED ORDER — METOPROLOL SUCCINATE ER 25 MG PO TB24: 12.5000 mg | ORAL_TABLET | Freq: Every day | ORAL | 3 refills | Status: DC

## 2019-09-04 NOTE — Telephone Encounter (Signed)
Pt is calling in because he said that [facility] will not ship out medication doxepin (SINEQUAN) 10 MG/ML solution due to additional information being needed. Pt would like to have someone to reach out to pharmacy to assist him further with getting medication.

## 2019-09-04 NOTE — Progress Notes (Signed)
OFFICE NOTE  Chief Complaint:  No complaints  Primary Care Physician: Martinique, [name], MD  HPI:  Charles [name] is a pleasant 68 year old male who is originally from new [place]. He is here for evaluation of progressive shortness of breath. He does have a history of asthma but recently is been having some cough and shortness of breath. He also has a history of diabetes on insulin, dyslipidemia, hypertension, and PAD. He tells me in 1996 he had a painful toe and was found to have rest pain in the office and was admitted for peripheral angiogram. He underwent a balloon angioplasty and ultimately required a stent per his report. We do not have those records available and we'll try to request them. He has not had follow-up since that time including no peripheral Dopplers. He's never had carotid Dopplers. He says in the past he's had a stress test but it's been many years ago. He denies any chest pain but is progressively more short of breath. He's also morbidly obese.  I saw Mr. Noreen back in the office today. He's recently been very anxious since he's had an episode of chest discomfort. This was after lifting a heavy couch. He says symptoms were sharp and intermittent during the first day and improved after a couple of days. What constant and did not sound anginal in nature. He did undergo nuclear stress test which was negative for ischemia with normal LV function. He had bilateral lower extremity ultrasounds which show patent vessels and no evidence of a stenosis of his previously placed peripheral stent. He also had bilateral carotid artery Dopplers which indicate mild carotid disease.  04/24/2016  Mr. Nims returns today for follow-up. He's had some intermittent episodes of sharp chest pain. This is also associated with neck pain and some pain that radiates down his arms. He also reports some loose stool and diarrhea. This was worse on metformin however after discontinuing he still  has symptoms. He does get some dizziness which I suspect is related to neck pain. He denies any claudication. His Dopplers in August of last year showed normal ABIs and mild bilateral carotid disease. This is due for follow-up in August of next year. He is also overdue for a lipid profile.  03/11/2017  Mr. Hinchman was seen today in follow-up. He reports some occasional palpitations. He denies any chest pain or worsening shortness of breath. His diarrhea that he had previously was resolved. He denies any pain with walking. Blood pressure was elevated today however he just recently took his medicines. A repeat blood pressure check came down to 134/80. 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 He is due for repeat lipid profile. He recently was put on oral blood sugar medication which caused him some shortness of breath and other symptoms however he stopped taking it and the symptoms resolved. He denies any claudication.  05/18/2018  Mr. Delvecchio was seen today in follow-up.  He saw his primary care provider Dr. Martinique in January.  At time he was complaining of chest pain.  EKG was performed and was negative.  He was referred back here for follow-up.  He reports today that he continues to have chest discomfort.  Sometimes is worse when moving his neck or lifting his left arm.  He can also have it at rest or with  some exertion.  He is not very physically active.  We had planned to work on weight loss however his weight is essentially unchanged.  We had also previously discussed the possibility of sleep apnea because of significant sleepiness, however he reports that is improved somewhat and feels like it was related to transitioning from working at night to now sleeping at night in his retirement.  Of note he said his brother actually died recently of a massive heart attack  at age 46.  He then mentioned that he was a heavy drug user.  Still, there may be some genetic predisposition to coronary disease.  He has multiple coronary risk factors including type 2 diabetes, hypertension and dyslipidemia as well as morbid obesity.  09/09/2018  Mr. Gatley returns today for follow-up.  Overall he is asymptomatic, denying any chest pain or worsening shortness of breath.  He denies any symptoms of claudication.  Blood pressures well controlled today.  He remains overweight.  He is cholesterol is at goal with LDL less than 70 however hemoglobin A1c recently increased up to 7.9.  Needs to make significant changes in his diet and reducing carbohydrates.  He did undergo a stress test in June 2019 which showed minimal reversible ischemia which was thought to be low risk and normal LV function.  Given the fact that he is been asymptomatic since then, I would not pursue any additional invasive work-up.  09/04/2019  Mr. Ashworth is seen today for routine annual follow-up.  Overall he is doing well without complaints.  Denies any chest pain.  He does report some shortness of breath with exertion.  Blood pressure is top normal today.  He reports his diabetes has been a little better controlled.  EKG shows a sinus tachycardia with a pulmonary disease pattern.  Weight continues to be an issue and is fairly stable.  He is not currently on beta-blocker.  PMHx:  Past Medical History:  Diagnosis Date  . Abnormal carotid ultrasound 07/2015   mild bilateral stenosis, repeat in 1 year.  Dr. Debara Pickett  . Diabetes mellitus without complication ([place])   . Gout   . Hepatitis C antibody test positive 05/2015   prior infection, resolved  . History of cardiovascular stress test 9/16   Lexiscan, EF 55%, no ischemia, Dr. Debara Pickett  . Hyperlipidemia   . Hypertension   . Noncompliance   . Obesity   . PVD (peripheral vascular disease) ([place]) 07/2015   normal ABIs, Dr. Debara Pickett    Past Surgical History:  Procedure  Laterality Date  . BALLOON ANGIOPLASTY, ARTERY N/A also placed stent  . COLONOSCOPY  2016   Dr. Benson Norway    FAMHx:  Family History  Problem Relation Age of Onset  . Asthma Mother   . Heart attack Mother   . Hypertension Mother   . Hyperlipidemia Mother   . Hypertension Father   . Diabetes Father   . HIV Sister   . Liver disease Brother   . Lung cancer Brother   . Hypertension Brother     SOCHx:   reports that he quit smoking about 24 years ago. His smoking use included cigarettes. He has a 30.00 pack-year smoking history. He has never used smokeless tobacco. He reports that he does not drink alcohol or use drugs.  ALLERGIES:  Allergies  Allergen Reactions  . Penicillins Other (See Comments)    Per pt blacked out after injection as a child.     ROS: Pertinent items noted in HPI and remainder  of comprehensive ROS otherwise negative.  HOME MEDS: Current Outpatient Medications  Medication Sig Dispense Refill  . ACCU-CHEK FASTCLIX LANCETS MISC Use to check blood sugars 2-3 times daily.    . Alcohol Swabs (B-D SINGLE USE SWABS REGULAR) PADS 1 Device by Does not apply route daily. 90 each 3  . allopurinol (ZYLOPRIM) 300 MG tablet TAKE 1 TABLET EVERY DAY 90 tablet 1  . amLODipine (NORVASC) 10 MG tablet TAKE 1 TABLET EVERY DAY 90 tablet 1  . aspirin 81 MG tablet Take 81 mg by mouth daily.    . Blood Glucose Monitoring Suppl (ACCU-CHEK AVIVA PLUS) w/Device KIT Use to test blood sugar daily 1 kit 2  . Blood Glucose Monitoring Suppl (ACCU-CHEK NANO SMARTVIEW) w/Device KIT Use as directed.    . doxepin (SINEQUAN) 10 MG/ML solution 3 mg daily at bedtime , increase dose to 6 mg in 5 days if needed and then to 10 mg in 5 days if needed. 30 mL 1  . glimepiride (AMARYL) 4 MG tablet TAKE 1 TABLET EVERY DAY BEFORE BREAKFAST 90 tablet 1  . glucose blood (ACCU-CHEK AVIVA PLUS) test strip Use as instructed 100 each 12  . irbesartan (AVAPRO) 75 MG tablet TAKE 1 TABLET EVERY DAY 90 tablet 0  .  metFORMIN (GLUCOPHAGE) 500 MG tablet 1 tab am and 2 tabs with supper. 180 tablet 2  . rosuvastatin (CRESTOR) 10 MG tablet TAKE 1 TABLET EVERY DAY 90 tablet 3   No current facility-administered medications for this visit.     LABS/IMAGING: No results found for this or any previous visit (from the past 48 hour(s)). No results found.  WEIGHTS: Wt Readings from Last 3 Encounters:  09/04/19 264 lb (119.7 kg)  02/03/19 267 lb 3.2 oz (121.2 kg)  01/13/19 266 lb 2 oz (120.7 kg)    VITALS: BP 140/90   Pulse (!) 111   Ht '5\' 5"'  (1.651 m)   Wt 264 lb (119.7 kg)   BMI 43.93 kg/m   EXAM: General appearance: alert, no distress and morbidly obese Neck: no carotid bruit and no JVD Lungs: clear to auscultation bilaterally Heart: regular rate and rhythm, S1, S2 normal, no murmur, click, rub or gallop Abdomen: morbidly obese Extremities: extremities normal, atraumatic, no cyanosis or edema Pulses: 2+ and symmetric Skin: Skin color, texture, turgor normal. No rashes or lesions Neurologic: Mental status: Alert, oriented, thought content appropriate Psych: Pleasant  EKG: Sinus tachycardia 111, pulmonary disease pattern-personally reviewed  ASSESSMENT: 1. Dyspnea on exertion low risk Myoview with minimal reversible ischemia as of June/2019 2. PAD with prior stent to the right leg in 1996 - normal ABIs 3. Asthma 4. Type 2 diabetes 5. Hypertension 6. Dyslipidemia 7. Morbid obesity 8. Cervicalgia/dizziness 9. Elevated EPWSS at 13  PLAN: 1.   Mr. Heyer continues to have some dyspnea on exertion but no ischemia on recent stress testing.  I suspect a lot of this is due to weight and deconditioning.  He needs to continue to work on that but does note shortness of breath with exercise.  He continues to manifest an elevated heart rate and would likely benefit from a beta-blocker.  In addition he has features concerning for sleep apnea and is not currently being treated.  I have encouraged him to  consider a sleep study and he is agreeable today to that.  We will try to arrange it for him.Marland Kitchen  Pixie Casino, MD, [facility], [place] Director of the  Advanced Lipid Disorders &  Cardiovascular Risk Reduction Clinic Diplomate of the American Board of Clinical Lipidology Attending Cardiologist  Direct Dial: [phone]  Fax: [phone]  Website:  [email or web address] Madia 09/04/2019, 2:06 PM

## 2019-09-04 NOTE — Patient Instructions (Addendum)
Medication Instructions:  START metoprolol succinate 12.5mg   Continue current medications  If you need a refill on your cardiac medications before your next appointment, please call your pharmacy.   Follow-Up: At [facility], you and your health needs are our priority.  As part of our continuing mission to provide you with exceptional heart care, we have created designated Provider Care Teams.  These Care Teams include your primary Cardiologist (physician) and Advanced Practice Providers (APPs -  Physician Assistants and Nurse Practitioners) who all work together to provide you with the care you need, when you need it. You will need a follow up appointment in 3 months. You may see Dr. Debara Pickett or one of the following Advanced Practice Providers on your designated Care Team: Almyra Deforest, [place] . Fabian Sharp, PA-C  Any Other Special Instructions Will Be Listed Below (If Applicable).  Dr. Debara Pickett has ordered a sleep study. Mariann Laster CMA (office sleep coordinator) will contact you about arranging this study.

## 2019-09-05 ENCOUNTER — Encounter: Payer: Self-pay | Admitting: Internal Medicine

## 2019-09-05 ENCOUNTER — Other Ambulatory Visit (INDEPENDENT_AMBULATORY_CARE_PROVIDER_SITE_OTHER): Payer: Medicare PPO

## 2019-09-05 DIAGNOSIS — E1159 Type 2 diabetes mellitus with other circulatory complications: Secondary | ICD-10-CM | POA: Diagnosis not present

## 2019-09-05 DIAGNOSIS — E785 Hyperlipidemia, unspecified: Secondary | ICD-10-CM | POA: Diagnosis not present

## 2019-09-05 DIAGNOSIS — M1A00X0 Idiopathic chronic gout, unspecified site, without tophus (tophi): Secondary | ICD-10-CM | POA: Diagnosis not present

## 2019-09-05 LAB — LIPID PANEL
Cholesterol: 120 mg/dL (ref 0–200)
HDL: 37.7 mg/dL — ABNORMAL LOW (ref >39.00)
LDL Cholesterol: 65 mg/dL (ref 0–99)
NonHDL: 81.99
Total CHOL/HDL Ratio: 3
Triglycerides: 87 mg/dL (ref 0.0–149.0)
VLDL: 17.4 mg/dL (ref 0.0–40.0)

## 2019-09-05 LAB — URIC ACID: Uric Acid, Serum: 5.2 mg/dL (ref 4.0–7.8)

## 2019-09-05 LAB — HEMOGLOBIN A1C: Hgb A1c MFr Bld: 7.6 % — ABNORMAL HIGH (ref 4.6–6.5)

## 2019-09-05 LAB — MICROALBUMIN / CREATININE URINE RATIO
Creatinine,U: 392.6 mg/dL
Microalb Creat Ratio: 0.9 mg/g (ref 0.0–30.0)
Microalb, Ur: 3.6 mg/dL — ABNORMAL HIGH (ref 0.0–1.9)

## 2019-09-07 ENCOUNTER — Telehealth: Payer: Self-pay | Admitting: *Deleted

## 2019-09-07 ENCOUNTER — Telehealth: Payer: Self-pay

## 2019-09-07 NOTE — Telephone Encounter (Signed)
Patient notified of HST appointment. 

## 2019-09-07 NOTE — Telephone Encounter (Signed)
Copied from [place] [id number]. Topic: General - Other >> Sep 07, 2019 11:43 AM Leward Quan A wrote: Reason for CRM: Jeani Hawking with [facility] pharmacy called to request some clarification on the instruction for doxepin (SINEQUAN) 10 MG/ML solution which was sent over on 08/29/2019. Please call Ph# [phone]

## 2019-09-08 NOTE — Telephone Encounter (Signed)
Spoke with Wyatt Portela, the pharmacist and she stated that medication could only be dispensed in a full bottle which is 120 mg/ml solution and wanted to know if provider would approve. Per Dr. Martinique it was okay to fill 120 mg/ml solution with no refills.

## 2019-09-08 NOTE — Telephone Encounter (Signed)
Returned call to pharmacy and clarified medication directions per Dr. Doug Sou orders.

## 2019-09-08 NOTE — Telephone Encounter (Signed)
Spoke with Charles [name], the pharmacist and she stated that medication could only be dispensed in a full bottle which is 120 mg/ml solution and wanted to know if provider would approve. Per Dr. Jordan it was okay to fill 120 mg/ml solution with no refills.  

## 2019-09-08 NOTE — Telephone Encounter (Signed)
Wyatt Portela called back in to speak with Noelle Penner in regards to directions for patient's medication. Please advise. Call back is [phone] and please ask for Wyatt Portela first.

## 2019-09-16 ENCOUNTER — Other Ambulatory Visit: Payer: Self-pay | Admitting: Family Medicine

## 2019-09-16 DIAGNOSIS — E1159 Type 2 diabetes mellitus with other circulatory complications: Secondary | ICD-10-CM

## 2019-10-10 ENCOUNTER — Other Ambulatory Visit: Payer: Self-pay | Admitting: Family Medicine

## 2019-10-23 ENCOUNTER — Other Ambulatory Visit: Payer: Self-pay

## 2019-10-23 ENCOUNTER — Ambulatory Visit (HOSPITAL_BASED_OUTPATIENT_CLINIC_OR_DEPARTMENT_OTHER): Payer: Medicare PPO | Attending: Internal Medicine | Admitting: Cardiovascular Disease

## 2019-10-23 DIAGNOSIS — Z20822 Contact with and (suspected) exposure to covid-19: Secondary | ICD-10-CM

## 2019-10-23 DIAGNOSIS — Z20828 Contact with and (suspected) exposure to other viral communicable diseases: Secondary | ICD-10-CM | POA: Diagnosis not present

## 2019-10-23 DIAGNOSIS — Z7982 Long term (current) use of aspirin: Secondary | ICD-10-CM | POA: Insufficient documentation

## 2019-10-23 DIAGNOSIS — R0602 Shortness of breath: Secondary | ICD-10-CM | POA: Insufficient documentation

## 2019-10-23 DIAGNOSIS — R0683 Snoring: Secondary | ICD-10-CM | POA: Diagnosis not present

## 2019-10-23 DIAGNOSIS — G4733 Obstructive sleep apnea (adult) (pediatric): Secondary | ICD-10-CM | POA: Diagnosis not present

## 2019-10-23 DIAGNOSIS — R000 Tachycardia, unspecified: Secondary | ICD-10-CM | POA: Diagnosis not present

## 2019-10-23 DIAGNOSIS — Z79899 Other long term (current) drug therapy: Secondary | ICD-10-CM | POA: Insufficient documentation

## 2019-10-23 DIAGNOSIS — R0902 Hypoxemia: Secondary | ICD-10-CM | POA: Diagnosis not present

## 2019-10-23 DIAGNOSIS — Z7984 Long term (current) use of oral hypoglycemic drugs: Secondary | ICD-10-CM | POA: Insufficient documentation

## 2019-10-24 LAB — NOVEL CORONAVIRUS, NAA: SARS-CoV-2, NAA: NOT DETECTED

## 2019-10-27 ENCOUNTER — Other Ambulatory Visit: Payer: Self-pay | Admitting: Family Medicine

## 2019-10-27 DIAGNOSIS — I10 Essential (primary) hypertension: Secondary | ICD-10-CM

## 2019-10-27 DIAGNOSIS — E1159 Type 2 diabetes mellitus with other circulatory complications: Secondary | ICD-10-CM

## 2019-10-29 ENCOUNTER — Encounter (HOSPITAL_BASED_OUTPATIENT_CLINIC_OR_DEPARTMENT_OTHER): Payer: Self-pay | Admitting: Cardiovascular Disease

## 2019-10-29 NOTE — Procedures (Signed)
     Patient Name: Charles [name], Charles [name] Date: 10/24/2019 Gender: Male D.O.B: 1951/03/11 Age (years): 68 Referring Provider: Nadean Corwin Hilty Height (inches): 66 Interpreting Physician: Shelva Majestic MD, ABSM Weight (lbs): 265 RPSGT: Jacolyn Reedy BMI: 43 MRN: [id number] Neck Size: 17.00  CLINICAL INFORMATION Sleep Study Type: HST  Indication for sleep study: snoring, sleepiness  Epworth Sleepiness Score: 9  SLEEP STUDY TECHNIQUE A multi-channel overnight portable sleep study was performed. The channels recorded were: nasal airflow, thoracic respiratory movement, and oxygen saturation with a pulse oximetry. Snoring was also monitored.  MEDICATIONS     ACCU-CHEK FASTCLIX LANCETS MISC         Alcohol Swabs (B-D SINGLE USE SWABS REGULAR) PADS         allopurinol (ZYLOPRIM) 300 MG tablet         amLODipine (NORVASC) 10 MG tablet         aspirin 81 MG tablet         Blood Glucose Monitoring Suppl (ACCU-CHEK AVIVA PLUS) w/Device KIT         Blood Glucose Monitoring Suppl (ACCU-CHEK NANO SMARTVIEW) w/Device KIT         doxepin (SINEQUAN) 10 MG/ML solution         glimepiride (AMARYL) 4 MG tablet         glucose blood (ACCU-CHEK AVIVA PLUS) test strip         irbesartan (AVAPRO) 75 MG tablet         metFORMIN (GLUCOPHAGE) 500 MG tablet         metoprolol succinate (TOPROL-XL) 25 MG 24 hr tablet         rosuvastatin (CRESTOR) 10 MG tablet      Patient self administered medications include: N/A.  SLEEP ARCHITECTURE Patient was studied for 325 minutes. The sleep efficiency was 91.2 % and the patient was supine for 87%. The arousal index was 0.0 per hour.  RESPIRATORY PARAMETERS The overall AHI was 13.3 per hour, with a central apnea index of 0.0 per hour.  The oxygen nadir was 71% during sleep.  CARDIAC DATA Mean heart rate during sleep was 83.8 bpm.  IMPRESSIONS - Mild obstructive sleep apnea overall during this study (AHI 13.3/h). Since this was a home study the  severity of the patient's sleep apnea during REM sleep cannot be assessed. - No significant central sleep apnea occurred during this study (CAI = 0.0/h). - Severe oxygen desaturation to a nadir of 71%. Time spent below 89% was 9.1 minutes. - Patient snored 5.5% during the sleep.  DIAGNOSIS - Obstructive Sleep Apnea (327.23 [G47.33 ICD-10]) - Nocturnal Hypoxemia (327.26 [G47.36 ICD-10])  RECOMMENDATIONS - In this patient with cardiovascular comorbiditiea and significant nocturnal oxygen desaturation recommend a therapeutic CPAP titration to determine optimal pressure required to alleviate sleep disordered breathing. - Effort should be made to optimize nasal and oropharyngeal patency.  - Avoid alcohol, sedatives and other CNS depressants that may worsen sleep apnea and disrupt normal sleep architecture. - Sleep hygiene should be reviewed to assess factors that may improve sleep quality. - Weight management (BMI 43) and regular exercise should be initiated or continued. - Recommend a download be obtained after CPAP initiation and sleep clinic evaluation.   [Electronically signed] 10/29/2019 05:48 PM  Shelva Majestic MD, [facility], [place], American Board of Sleep Medicine   NPI: [id number] [place] PH: [phone]   FX: [phone] [place]

## 2019-11-08 ENCOUNTER — Other Ambulatory Visit: Payer: Self-pay | Admitting: Cardiovascular Disease

## 2019-11-08 ENCOUNTER — Telehealth: Payer: Self-pay | Admitting: *Deleted

## 2019-11-08 DIAGNOSIS — G4733 Obstructive sleep apnea (adult) (pediatric): Secondary | ICD-10-CM

## 2019-11-08 DIAGNOSIS — I10 Essential (primary) hypertension: Secondary | ICD-10-CM

## 2019-11-08 DIAGNOSIS — IMO0002 Reserved for concepts with insufficient information to code with codable children: Secondary | ICD-10-CM

## 2019-11-08 DIAGNOSIS — G4736 Sleep related hypoventilation in conditions classified elsewhere: Secondary | ICD-10-CM

## 2019-11-08 NOTE — Telephone Encounter (Signed)
Patient notified of HST results and recommendations. Despite saying that he did not sleep at all while he had the HSTon he has agreed to having a titration study.

## 2019-11-10 ENCOUNTER — Other Ambulatory Visit: Payer: Self-pay | Admitting: Cardiovascular Disease

## 2019-11-10 ENCOUNTER — Telehealth: Payer: Self-pay | Admitting: *Deleted

## 2019-11-10 DIAGNOSIS — I10 Essential (primary) hypertension: Secondary | ICD-10-CM

## 2019-11-10 DIAGNOSIS — G4733 Obstructive sleep apnea (adult) (pediatric): Secondary | ICD-10-CM

## 2019-11-10 NOTE — Telephone Encounter (Signed)
CPAP titration PA submitted to [facility] via web portal. Clinicals faxed to [phone].

## 2019-11-13 ENCOUNTER — Telehealth: Payer: Self-pay | Admitting: *Deleted

## 2019-11-13 NOTE — Telephone Encounter (Signed)
Called patient to give CPAP titration appointment. He states he will have to get back to me. He's not sure if he's going to do it.

## 2019-11-21 ENCOUNTER — Encounter: Payer: Self-pay | Admitting: Family Medicine

## 2019-11-21 ENCOUNTER — Ambulatory Visit (INDEPENDENT_AMBULATORY_CARE_PROVIDER_SITE_OTHER): Payer: Medicare PPO | Admitting: Family Medicine

## 2019-11-21 ENCOUNTER — Other Ambulatory Visit: Payer: Self-pay

## 2019-11-21 VITALS — BP 160/86 | HR 112 | Temp 97.8°F | Resp 16 | Ht 66.0 in | Wt 272.6 lb

## 2019-11-21 DIAGNOSIS — R000 Tachycardia, unspecified: Secondary | ICD-10-CM | POA: Diagnosis not present

## 2019-11-21 DIAGNOSIS — E1159 Type 2 diabetes mellitus with other circulatory complications: Secondary | ICD-10-CM

## 2019-11-21 DIAGNOSIS — I10 Essential (primary) hypertension: Secondary | ICD-10-CM

## 2019-11-21 DIAGNOSIS — R197 Diarrhea, unspecified: Secondary | ICD-10-CM

## 2019-11-21 DIAGNOSIS — G4700 Insomnia, unspecified: Secondary | ICD-10-CM | POA: Diagnosis not present

## 2019-11-21 LAB — TSH: TSH: 3.93 u[IU]/mL (ref 0.35–4.50)

## 2019-11-21 LAB — BASIC METABOLIC PANEL
BUN: 15 mg/dL (ref 6–23)
CO2: 24 mEq/L (ref 19–32)
Calcium: 9.4 mg/dL (ref 8.4–10.5)
Chloride: 103 mEq/L (ref 96–112)
Creatinine, Ser: 1.13 mg/dL (ref 0.40–1.50)
GFR: 78.11 mL/min (ref >60.00)
Glucose, Bld: 182 mg/dL — ABNORMAL HIGH (ref 70–99)
Potassium: 3.4 mEq/L — ABNORMAL LOW (ref 3.5–5.1)
Sodium: 141 mEq/L (ref 135–145)

## 2019-11-21 MED ORDER — DOXEPIN HCL 10 MG/ML PO CONC: ORAL | 0 refills | Status: DC

## 2019-11-21 MED ORDER — METOPROLOL SUCCINATE ER 25 MG PO TB24: 25.0000 mg | ORAL_TABLET | Freq: Every day | ORAL | 1 refills | Status: DC

## 2019-11-21 NOTE — Progress Notes (Signed)
HPI:   Mr.Daymen R Kessenich is a 68 y.o. male, who is here today for 3-4 months follow up.   He was last seen on 08/29/19,virtual visit. Since his last visit he has follow-up with his cardiologist.  DM II:  Lab Results  Component Value Date   HGBA1C 7.6 (H) 09/05/2019   BS's "up and down"  He has not followed dietary recommendations. Denies polydipsia,polyuria, or polyphagia.  Currently he is on Metformin 500 mg bid and Amaryl 4 mg daily. Denies hypoglycemic events.  BP elevated today. HTN on Avapro 75 mg daily, Metoprolol Succinate 12.5 mg daily,and Amlodipine 10 mg daily.  Noted mild tachycardia.  He took his antihypertensive medications 30 min ago. He has not checked BP home. Denies severe/frequent headache, visual changes, chest pain, palpitation, focal weakness, or worsening edema. Hx of exertional dyspnea.   Lab Results  Component Value Date   CREATININE 1.27 01/13/2019   BUN 15 01/13/2019   NA 143 01/13/2019   K 3.9 01/13/2019   CL 105 01/13/2019   CO2 25 01/13/2019   Chronic diarrhea: Last visit Doxepin was recommended. GI referral was placed in 01/2019 but he did not call back to schedule appt. He has not identified exacerbating or alleviating factors.  It was a problem with Doxepin Rx, his insurance wanted 120 ml instead 30 ml. We authorized 120 ml but he did not get Rx. Diarrhea is loose and watery. Today he has not had a bowel movements. Yesterday he had 5-6 stools.  Lab Results  Component Value Date   WBC 8.3 02/03/2019   HGB 13.3 02/03/2019   HCT 42.0 02/03/2019   MCV 77.1 (L) 02/03/2019   PLT 225.0 02/03/2019    Insomnia for long time, 2 hours at the time. This is a chronic problem. He has not tried OTC medications.   Pending sleep study. Diarrhea is aggravating problem, having episodes in the middle of the night.  Review of Systems  Constitutional: Positive for fatigue. Negative for activity change, appetite change and fever.   HENT: Negative for nosebleeds, sore throat and trouble swallowing.   Respiratory: Negative for cough and wheezing.   Gastrointestinal: Negative for abdominal pain, nausea and vomiting.  Genitourinary: Negative for decreased urine volume, dysuria and hematuria.  Musculoskeletal: Positive for arthralgias.  Skin: Negative for rash and wound.  Neurological: Negative for facial asymmetry and speech difficulty.  Psychiatric/Behavioral: Negative for confusion. The patient is nervous/anxious.   Rest of ROS, see pertinent positives sand negatives in HPI  Current Outpatient Medications on File Prior to Visit  Medication Sig Dispense Refill  . ACCU-CHEK FASTCLIX LANCETS MISC Use to check blood sugars 2-3 times daily.    . Alcohol Swabs (B-D SINGLE USE SWABS REGULAR) PADS 1 Device by Does not apply route daily. 90 each 3  . allopurinol (ZYLOPRIM) 300 MG tablet TAKE 1 TABLET EVERY DAY 90 tablet 1  . amLODipine (NORVASC) 10 MG tablet TAKE 1 TABLET EVERY DAY 90 tablet 1  . aspirin 81 MG tablet Take 81 mg by mouth daily.    . Blood Glucose Monitoring Suppl (ACCU-CHEK AVIVA PLUS) w/Device KIT Use to test blood sugar daily 1 kit 2  . Blood Glucose Monitoring Suppl (ACCU-CHEK NANO SMARTVIEW) w/Device KIT Use as directed.    Marland Kitchen glimepiride (AMARYL) 4 MG tablet TAKE 1 TABLET EVERY DAY BEFORE BREAKFAST 90 tablet 1  . glucose blood (ACCU-CHEK AVIVA PLUS) test strip Use as instructed 100 each 12  . irbesartan (AVAPRO)  75 MG tablet TAKE 1 TABLET EVERY DAY 90 tablet 0  . metFORMIN (GLUCOPHAGE) 500 MG tablet TAKE 1 TABLET TWICE DAILY WITH A MEAL 60 tablet 3  . rosuvastatin (CRESTOR) 10 MG tablet TAKE 1 TABLET EVERY DAY 90 tablet 3   No current facility-administered medications on file prior to visit.      Past Medical History:  Diagnosis Date  . Abnormal carotid ultrasound 07/2015   mild bilateral stenosis, repeat in 1 year.  Dr. Debara Pickett  . Diabetes mellitus without complication ([place])   . Gout   . Hepatitis C  antibody test positive 05/2015   prior infection, resolved  . History of cardiovascular stress test 9/16   Lexiscan, EF 55%, no ischemia, Dr. Debara Pickett  . Hyperlipidemia   . Hypertension   . Noncompliance   . Obesity   . PVD (peripheral vascular disease) ([place]) 07/2015   normal ABIs, Dr. Debara Pickett   Allergies  Allergen Reactions  . Penicillins Other (See Comments)    Per pt blacked out after injection as a child.     Social History   Socioeconomic History  . Marital status: Divorced    Spouse name: Not on file  . Number of children: Not on file  . Years of education: Not on file  . Highest education level: Not on file  Occupational History  . Not on file  Social Needs  . Financial resource strain: Not on file  . Food insecurity    Worry: Not on file    Inability: Not on file  . Transportation needs    Medical: Not on file    Non-medical: Not on file  Tobacco Use  . Smoking status: Former Smoker    Packs/day: 1.00    Years: 30.00    Pack years: 30.00    Types: Cigarettes    Quit date: 07/31/1995    Years since quitting: 24.3  . Smokeless tobacco: Never Used  Substance and Sexual Activity  . Alcohol use: No    Alcohol/week: 0.0 standard drinks  . Drug use: No  . Sexual activity: Not on file  Lifestyle  . Physical activity    Days per week: Not on file    Minutes per session: Not on file  . Stress: Not on file  Relationships  . Social Herbalist on phone: Not on file    Gets together: Not on file    Attends religious service: Not on file    Active member of club or organization: Not on file    Attends meetings of clubs or organizations: Not on file    Relationship status: Not on file  Other Topics Concern  . Not on file  Social History Narrative  . Not on file    Vitals:   11/21/19 0905 11/21/19 0938  BP: (!) 160/86   Pulse: (!) 122 (!) 112  Resp: 16   Temp: 97.8 F (36.6 C)   SpO2: 96%    Body mass index is 44 kg/m.   Physical Exam   Nursing note and vitals reviewed. Constitutional: He is oriented to person, place, and time. He appears well-developed. No distress.  HENT:  Head: Normocephalic and atraumatic.  Mouth/Throat: Oropharynx is clear and moist and mucous membranes are normal.  Eyes: Pupils are equal, round, and reactive to light. Conjunctivae are normal.  Cardiovascular: Regular rhythm. Tachycardia present.  No murmur heard. Pulses:      Dorsalis pedis pulses are 2+ on the right side and  2+ on the left side.  Respiratory: Effort normal and breath sounds normal. No respiratory distress.  GI: Soft. He exhibits no mass. There is no hepatomegaly. There is no abdominal tenderness.  Musculoskeletal:        General: Edema (Trace pitting LE edema, bilateral.) present.  Lymphadenopathy:    He has no cervical adenopathy.  Neurological: He is alert and oriented to person, place, and time. He has normal strength. No cranial nerve deficit. Gait normal.  Skin: Skin is warm. No rash noted. No erythema.  Psychiatric: He has a normal mood and affect.  Well groomed, good eye contact.     ASSESSMENT AND PLAN:   Mr. ADONI GREENOUGH was seen today for chronic disease management.  Orders Placed This Encounter  Procedures  . Basic Metabolic Panel  . TSH   Lab Results  Component Value Date   TSH 3.93 11/21/2019   Lab Results  Component Value Date   CREATININE 1.13 11/21/2019   BUN 15 11/21/2019   NA 141 11/21/2019   K 3.4 (L) 11/21/2019   CL 103 11/21/2019   CO2 24 11/21/2019    1. Type 2 diabetes mellitus with vascular disease ([place]) HgA1C is not at goal, will check next OV. No changes in current management. Regular exercise and healthy diet with avoidance of added sugar food intake is an important part of treatment and recommended. Annual eye exam, periodic dental and foot care recommended. F/U in 3 months  - Basic Metabolic Panel  2. Essential hypertension BP elevated today. We discussed possible  complications of elevated BP. Metoprolol Succinate increased from 12.5 mg to 25 mg daily. No changes in rest of his BP meds. He ahs an appt with his cardiologist in a few days. Instructed about warning signs.  3. Insomnia, unspecified type Chronic. Good sleep hygiene recommended. Doxepin 3 mg, he can titrate dose up to 10 mg as tolerated.  - doxepin (SINEQUAN) 10 MG/ML solution; 3 mg daily at bedtime , increase dose to 6 mg in 5 days if needed and then to 10 mg in 5 days if needed.  Dispense: 120 mL; Refill: 0 - TSH  4. Diarrhea, unspecified type Possible causes discussed. Problem could be aggravated by Metformin but he does not think so. He prefers to hold on GI appt. Doxepin may help,side effects discussed.  5. Sinus tachycardia Improved some. Instructed about warning signs. Taught how to check and count pulse. Metoprolol Succinate increased to 25 mg daily. Keep appt with cardiologist.   Return in about 4 months (around 03/21/2020) for HTN, DM II.    -Mr. LUCUS LAMBERTSON was advised to return sooner than planned today if new concerns arise.       Javion Holmer G. Martinique, MD  [facility]. [place] office.

## 2019-11-21 NOTE — Patient Instructions (Addendum)
A few things to remember from today's visit:   Type 2 diabetes mellitus with vascular disease ([place]) - Plan: Basic Metabolic Panel  Essential hypertension  Chronic diarrhea  Diarrhea, unspecified type - Plan: doxepin (SINEQUAN) 10 MG/ML solution  Insomnia, unspecified type - Plan: doxepin (SINEQUAN) 10 MG/ML solution, TSH  Increase Metoprolol Succinate from 12.5 mg to 25 mg (whole tab). Monitor your pulse as we discussed and it should be under 100 beats per minute.  No changes in rest of your meds.   Please be sure medication list is accurate. If a new problem present, please set up appointment sooner than planned today.

## 2019-11-24 ENCOUNTER — Other Ambulatory Visit (HOSPITAL_COMMUNITY)
Admission: RE | Admit: 2019-11-24 | Discharge: 2019-11-24 | Disposition: A | Discharge status: Home / Self Care | Payer: Medicare PPO | Source: Ambulatory Visit | Attending: Cardiovascular Disease | Admitting: Cardiovascular Disease

## 2019-11-24 DIAGNOSIS — Z01812 Encounter for preprocedural laboratory examination: Secondary | ICD-10-CM | POA: Insufficient documentation

## 2019-11-24 DIAGNOSIS — Z20828 Contact with and (suspected) exposure to other viral communicable diseases: Secondary | ICD-10-CM | POA: Insufficient documentation

## 2019-11-24 LAB — SARS CORONAVIRUS 2 (TAT 6-24 HRS): SARS Coronavirus 2: NEGATIVE

## 2019-11-27 ENCOUNTER — Other Ambulatory Visit: Payer: Self-pay

## 2019-11-27 ENCOUNTER — Ambulatory Visit (HOSPITAL_BASED_OUTPATIENT_CLINIC_OR_DEPARTMENT_OTHER): Payer: Medicare PPO | Attending: Cardiovascular Disease | Admitting: Cardiovascular Disease

## 2019-11-27 DIAGNOSIS — Z6841 Body Mass Index (BMI) 40.0 and over, adult: Secondary | ICD-10-CM | POA: Insufficient documentation

## 2019-11-27 DIAGNOSIS — Z7982 Long term (current) use of aspirin: Secondary | ICD-10-CM | POA: Insufficient documentation

## 2019-11-27 DIAGNOSIS — G4733 Obstructive sleep apnea (adult) (pediatric): Secondary | ICD-10-CM | POA: Insufficient documentation

## 2019-11-27 DIAGNOSIS — Z79899 Other long term (current) drug therapy: Secondary | ICD-10-CM | POA: Insufficient documentation

## 2019-11-27 DIAGNOSIS — Z7984 Long term (current) use of oral hypoglycemic drugs: Secondary | ICD-10-CM | POA: Insufficient documentation

## 2019-11-27 DIAGNOSIS — I10 Essential (primary) hypertension: Secondary | ICD-10-CM | POA: Diagnosis not present

## 2019-12-05 ENCOUNTER — Ambulatory Visit: Payer: Medicare PPO | Admitting: Internal Medicine

## 2019-12-05 ENCOUNTER — Other Ambulatory Visit: Payer: Self-pay

## 2019-12-05 ENCOUNTER — Encounter: Payer: Self-pay | Admitting: Internal Medicine

## 2019-12-05 VITALS — BP 148/92 | HR 111 | Temp 97.3°F | Ht 66.0 in | Wt 270.2 lb

## 2019-12-05 DIAGNOSIS — R0602 Shortness of breath: Secondary | ICD-10-CM

## 2019-12-05 DIAGNOSIS — G4733 Obstructive sleep apnea (adult) (pediatric): Secondary | ICD-10-CM | POA: Diagnosis not present

## 2019-12-05 DIAGNOSIS — I10 Essential (primary) hypertension: Secondary | ICD-10-CM

## 2019-12-05 NOTE — Patient Instructions (Signed)
Medication Instructions: TAKE 25MG  OF  METOPROLOL (1 WHOLE TABLET) DAILY *If you need a refill on your cardiac medications before your next appointment, please call your pharmacy*  Lab Work: NONE AT THIS TIME  Testing/Procedures: NONE AT THIS TIME   Follow-Up: At [facility], you and your health needs are our priority.  As part of our continuing mission to provide you with exceptional heart care, we have created designated Provider Care Teams.  These Care Teams include your primary Cardiologist (physician) and Advanced Practice Providers (APPs -  Physician Assistants and Nurse Practitioners) who all work together to provide you with the care you need, when you need it.  Your next appointment:   3 month(s)  The format for your next appointment:   Either In Person or Virtual  Provider:   Raliegh Ip Mali Hilty, MD  Other Instructions

## 2019-12-05 NOTE — Progress Notes (Signed)
OFFICE NOTE  Chief Complaint:  Shortness of breath  Primary Care Physician: Martinique, [name], MD  HPI:  Charles [name] is a pleasant 68 year old male who is originally from [place]. He is here for evaluation of progressive shortness of breath. He does have a history of asthma but recently is been having some cough and shortness of breath. He also has a history of diabetes on insulin, dyslipidemia, hypertension, and PAD. He tells me in 1996 he had a painful toe and was found to have rest pain in the office and was admitted for peripheral angiogram. He underwent a balloon angioplasty and ultimately required a stent per his report. We do not have those records available and we'll try to request them. He has not had follow-up since that time including no peripheral Dopplers. He's never had carotid Dopplers. He says in the past he's had a stress test but it's been many years ago. He denies any chest pain but is progressively more short of breath. He's also morbidly obese.  I saw Charles [name] back in the office today. He's recently been very anxious since he's had an episode of chest discomfort. This was after lifting a heavy couch. He says symptoms were sharp and intermittent during the first day and improved after a couple of days. What constant and did not sound anginal in nature. He did undergo nuclear stress test which was negative for ischemia with normal LV function. He had bilateral lower extremity ultrasounds which show patent vessels and no evidence of a stenosis of his previously placed peripheral stent. He also had bilateral carotid artery Dopplers which indicate mild carotid disease.  04/24/2016  Charles [name] returns today for follow-up. He's had some intermittent episodes of sharp chest pain. This is also associated with neck pain and some pain that radiates down his arms. He also reports some loose stool and diarrhea. This was worse on metformin however after discontinuing he  still has symptoms. He does get some dizziness which I suspect is related to neck pain. He denies any claudication. His Dopplers in August of last year showed normal ABIs and mild bilateral carotid disease. This is due for follow-up in August of next year. He is also overdue for a lipid profile.  03/11/2017  Charles [name] was seen today in follow-up. He reports some occasional palpitations. He denies any chest pain or worsening shortness of breath. His diarrhea that he had previously was resolved. He denies any pain with walking. Blood pressure was elevated today however he just recently took his medicines. A repeat blood pressure check came down to 134/80. 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 He is due for repeat lipid profile. He recently was put on oral blood sugar medication which caused him some shortness of breath and other symptoms however he stopped taking it and the symptoms resolved. He denies any claudication.  05/18/2018  Charles [name] was seen today in follow-up.  He saw his primary care provider Dr. Martinique in January.  At time he was complaining of chest pain.  EKG was performed and was negative.  He was referred back here for follow-up.  He reports today that he continues to have chest discomfort.  Sometimes is worse when moving his neck or lifting his left arm.  He can also have it at rest or  with some exertion.  He is not very physically active.  We had planned to work on weight loss however his weight is essentially unchanged.  We had also previously discussed the possibility of sleep apnea because of significant sleepiness, however he reports that is improved somewhat and feels like it was related to transitioning from working at night to now sleeping at night in his retirement.  Of note he said his brother actually died recently of a massive heart  attack at age 37.  He then mentioned that he was a heavy drug user.  Still, there may be some genetic predisposition to coronary disease.  He has multiple coronary risk factors including type 2 diabetes, hypertension and dyslipidemia as well as morbid obesity.  09/09/2018  Charles [name] returns today for follow-up.  Overall he is asymptomatic, denying any chest pain or worsening shortness of breath.  He denies any symptoms of claudication.  Blood pressures well controlled today.  He remains overweight.  He is cholesterol is at goal with LDL less than 70 however hemoglobin A1c recently increased up to 7.9.  Needs to make significant changes in his diet and reducing carbohydrates.  He did undergo a stress test in June 2019 which showed minimal reversible ischemia which was thought to be low risk and normal LV function.  Given the fact that he is been asymptomatic since then, I would not pursue any additional invasive work-up.  09/04/2019  Charles [name] is seen today for routine annual follow-up.  Overall he is doing well without complaints.  Denies any chest pain.  He does report some shortness of breath with exertion.  Blood pressure is top normal today.  He reports his diabetes has been a little better controlled.  EKG shows a sinus tachycardia with a pulmonary disease pattern.  Weight continues to be an issue and is fairly stable.  He is not currently on beta-blocker.  12/05/2019  Charles [name] returns today for follow-up.  He recently saw Dr. Martinique.  She had recommended increasing his Toprol from 12.5 to 25 mg daily.  He has not done that as he wanted to talk with me first.  He did undergo a sleep study which was on December 7.  I discussed that today with Dr. Claiborne Billings as the report is not yet in epic.  His AHI was 40/h and he will need to be fitted for CPAP.  Likely will get AutoPap.  The durable medical equipment company will reach out to him for this.  PMHx:  Past Medical History:  Diagnosis Date    Abnormal carotid ultrasound 07/2015   mild bilateral stenosis, repeat in 1 year.  Dr. Debara Pickett   Diabetes mellitus without complication ([place])    Gout    Hepatitis C antibody test positive 05/2015   prior infection, resolved   History of cardiovascular stress test 9/16   Lexiscan, EF 55%, no ischemia, Dr. Debara Pickett   Hyperlipidemia    Hypertension    Noncompliance    Obesity    PVD (peripheral vascular disease) ([place]) 07/2015   normal ABIs, Dr. Debara Pickett    Past Surgical History:  Procedure Laterality Date   BALLOON ANGIOPLASTY, ARTERY N/A also placed stent   COLONOSCOPY  2016   Dr. Benson Norway    FAMHx:  Family History  Problem Relation Age of Onset   Asthma Mother    Heart attack Mother    Hypertension Mother    Hyperlipidemia Mother    Hypertension Father    Diabetes Father  HIV Sister    Liver disease Brother    Lung cancer Brother    Hypertension Brother     SOCHx:   reports that he quit smoking about 24 years ago. His smoking use included cigarettes. He has a 30.00 pack-year smoking history. He has never used smokeless tobacco. He reports that he does not drink alcohol or use drugs.  ALLERGIES:  Allergies  Allergen Reactions   Penicillins Other (See Comments)    Per pt blacked out after injection as a child.     ROS: Pertinent items noted in HPI and remainder of comprehensive ROS otherwise negative.  HOME MEDS: Current Outpatient Medications  Medication Sig Dispense Refill   ACCU-CHEK FASTCLIX LANCETS MISC Use to check blood sugars 2-3 times daily.     Alcohol Swabs (B-D SINGLE USE SWABS REGULAR) PADS 1 Device by Does not apply route daily. 90 each 3   allopurinol (ZYLOPRIM) 300 MG tablet TAKE 1 TABLET EVERY DAY 90 tablet 1   amLODipine (NORVASC) 10 MG tablet TAKE 1 TABLET EVERY DAY 90 tablet 1   aspirin 81 MG tablet Take 81 mg by mouth daily.     Blood Glucose Monitoring Suppl (ACCU-CHEK AVIVA PLUS) w/Device KIT Use to test blood sugar daily 1  kit 2   Blood Glucose Monitoring Suppl (ACCU-CHEK NANO SMARTVIEW) w/Device KIT Use as directed.     doxepin (SINEQUAN) 10 MG/ML solution 3 mg daily at bedtime , increase dose to 6 mg in 5 days if needed and then to 10 mg in 5 days if needed. 120 mL 0   glimepiride (AMARYL) 4 MG tablet TAKE 1 TABLET EVERY DAY BEFORE BREAKFAST 90 tablet 1   glucose blood (ACCU-CHEK AVIVA PLUS) test strip Use as instructed 100 each 12   irbesartan (AVAPRO) 75 MG tablet TAKE 1 TABLET EVERY DAY 90 tablet 0   metFORMIN (GLUCOPHAGE) 500 MG tablet TAKE 1 TABLET TWICE DAILY WITH A MEAL 60 tablet 3   metoprolol succinate (TOPROL-XL) 25 MG 24 hr tablet Take 1 tablet (25 mg total) by mouth daily. 90 tablet 1   rosuvastatin (CRESTOR) 10 MG tablet TAKE 1 TABLET EVERY DAY 90 tablet 3   No current facility-administered medications for this visit.    LABS/IMAGING: No results found for this or any previous visit (from the past 48 hour(s)). No results found.  WEIGHTS: Wt Readings from Last 3 Encounters:  12/05/19 270 lb 3.2 oz (122.6 kg)  11/27/19 272 lb (123.4 kg)  11/21/19 272 lb 9.6 oz (123.7 kg)    VITALS: BP (!) 148/92    Pulse (!) 111    Temp (!) 97.3 F (36.3 C)    Ht '5\' 6"'$  (1.676 m)    Wt 270 lb 3.2 oz (122.6 kg)    SpO2 98%    BMI 43.61 kg/m   EXAM: General appearance: alert, no distress and morbidly obese Neck: no carotid bruit and no JVD Lungs: clear to auscultation bilaterally Heart: regular rate and rhythm, S1, S2 normal, no murmur, click, rub or gallop Abdomen: morbidly obese Extremities: extremities normal, atraumatic, no cyanosis or edema Pulses: 2+ and symmetric Skin: Skin color, texture, turgor normal. No rashes or lesions Neurologic: Mental status: Alert, oriented, thought content appropriate Psych: Pleasant  EKG: Sinus tachycardia at 106-personally reviewed  ASSESSMENT: 1. Dyspnea on exertion low risk Myoview with minimal reversible ischemia as of June/2019 2. PAD with prior  stent to the right leg in 1996 - normal ABIs 3. Asthma 4. Type 2 diabetes  5. Hypertension 6. Dyslipidemia 7. Morbid obesity 8. Cervicalgia/dizziness 9. OSA - not yet on CPAP  PLAN: 1.   Charles [name] has moderate OSA and will be fitted with CPAP.  He remains tachycardic and I agree with increasing his beta-blocker to 25 mg daily.  He may need to increase it further to 50 mg.  There appears to be plenty of room in the blood pressure.  Hopefully this will help with his shortness of breath.  Plan follow-up with me in 3 months.  Pixie Casino, MD, [facility], [place] Director of the Advanced Lipid Disorders &  Cardiovascular Risk Reduction Clinic Diplomate of the American Board of Clinical Lipidology Attending Cardiologist  Direct Dial: [phone]   Fax: [phone]  Website:  [email or web address] 12/05/2019, 10:37 AM

## 2019-12-08 ENCOUNTER — Encounter (HOSPITAL_BASED_OUTPATIENT_CLINIC_OR_DEPARTMENT_OTHER): Payer: Self-pay | Admitting: Cardiovascular Disease

## 2019-12-08 NOTE — Procedures (Signed)
Patient Name: Charles [name], Charles [name] Date: 11/27/2019 Gender: Male D.O.B: [birth year] Age (years): 68 Referring Provider: Nadean Corwin Hilty Height (inches): 66 Interpreting Physician: Shelva Majestic MD, ABSM Weight (lbs): 272 RPSGT: Zadie Rhine BMI: 44 MRN: [id number] Neck Size: 17.50  CLINICAL INFORMATION The patient is referred for a CPAP titration to treat sleep apnea.  Date of HST:10/24/2019: AHI 13.3/h; RDI 13.3/h  SLEEP STUDY TECHNIQUE As per the AASM Manual for the Scoring of Sleep and Associated Events v2.3 (April 2016) with a hypopnea requiring 4% desaturations.  The channels recorded and monitored were frontal, central and occipital EEG, electrooculogram (EOG), submentalis EMG (chin), nasal and oral airflow, thoracic and abdominal wall motion, anterior tibialis EMG, snore microphone, electrocardiogram, and pulse oximetry. Continuous positive airway pressure (CPAP) was initiated at the beginning of the study and titrated to treat sleep-disordered breathing.  MEDICATIONS allopurinol (ZYLOPRIM) 300 MG tablet  amLODipine (NORVASC) 10 MG tablet  aspirin 81 MG tablet  Blood Glucose Monitoring Suppl (ACCU-CHEK AVIVA PLUS) w/Device KIT  Blood Glucose Monitoring Suppl (ACCU-CHEK NANO SMARTVIEW) w/Device KIT  doxepin (SINEQUAN) 10 MG/ML solution  glimepiride (AMARYL) 4 MG tablet  glucose blood (ACCU-CHEK AVIVA PLUS) test strip  irbesartan (AVAPRO) 75 MG tablet  metFORMIN (GLUCOPHAGE) 500 MG tablet  metoprolol succinate (TOPROL-XL) 25 MG 24 hr tablet  rosuvastatin (CRESTOR) 10 MG tablet  Medications self-administered by patient taken the night of the study : CRESTOR, METFORMIN  TECHNICIAN COMMENTS Comments added by technician: ONE RESTROOM VISTED. Patient had difficulty initiating sleep. Patient was restless all through the night. Comments added by scorer: N/A  RESPIRATORY PARAMETERS Optimal PAP Pressure (cm): 8 AHI at Optimal Pressure (/hr): 0.0 Overall Minimal O2  (%): 88.0 Supine % at Optimal Pressure (%): 100 Minimal O2 at Optimal Pressure (%): 88.0   SLEEP ARCHITECTURE The study was initiated at 9:57:51 PM and ended at 3:55:17 AM.  Sleep onset time was 7.6 minutes and the sleep efficiency was 2.5%%. The total sleep time was 9 minutes.  The patient spent 77.8%% of the night in stage N1 sleep, 22.2%% in stage N2 sleep, 0.0%% in stage N3 and 0% in REM.Stage REM latency was N/A minutes  Wake after sleep onset was 340.8. Alpha intrusion was absent. Supine sleep was 100.00%.  CARDIAC DATA The 2 lead EKG demonstrated sinus rhythm. The mean heart rate was 79.7 beats per minute. Other EKG findings include: PVCs.  LEG MOVEMENT DATA The total Periodic Limb Movements of Sleep (PLMS) were 0. The PLMS index was 0.0. A PLMS index of <15 is considered normal in adults.  IMPRESSIONS - CPAP was initiated at 7 cm and was only titrated to 8 cm; however, questionable AHI 0 but RDI was 68.9/h; O2 nadir 88%. - Central sleep apnea was not noted during this titration (CAI = 0.0/h). - Mild oxygen desaturations were observed during this titration (min O2 = 88.0%). - No snoring was audible during this study. - 2-lead EKG demonstrated: PVCs - Clinically significant periodic limb movements were not noted during this study. Arousals associated with PLMs were rare.  DIAGNOSIS - Obstructive Sleep Apnea (327.23 [G47.33 ICD-10])  RECOMMENDATIONS - Recommend an intial trial of CPAP Auto with EPR of 3 at 8 - 16 cm H2O with heated humidification. A Medium size Fisher&Paykel Full Face Mask Simplus mask was used for the ititration study.  - Effort should be made to optimize nasal and oropharyngeal patency. - Avoid alcohol, sedatives and other CNS depressants that may worsen sleep apnea and disrupt normal  sleep architecture. - Sleep hygiene should be reviewed to assess factors that may improve sleep quality. - Weight management and regular exercise should be initiated or  continued. - Recommend a download in 30 days and sleep clinic evaluation after 4 weeks of therapy   [Electronically signed] 12/08/2019 06:47 PM  Shelva Majestic MD, [facility], ABSM Diplomate, American Board of Sleep Medicine   NPI: [id number] [place] PH: [phone]   FX: [phone] [place]

## 2019-12-11 ENCOUNTER — Telehealth: Payer: Self-pay | Admitting: *Deleted

## 2019-12-11 NOTE — Telephone Encounter (Signed)
CPAP set up order sent to [facility].

## 2020-01-01 ENCOUNTER — Other Ambulatory Visit: Payer: Self-pay | Admitting: Family Medicine

## 2020-01-01 DIAGNOSIS — I10 Essential (primary) hypertension: Secondary | ICD-10-CM

## 2020-01-01 DIAGNOSIS — E1159 Type 2 diabetes mellitus with other circulatory complications: Secondary | ICD-10-CM

## 2020-01-22 ENCOUNTER — Encounter: Payer: Self-pay | Admitting: Family Medicine

## 2020-02-08 ENCOUNTER — Telehealth: Payer: Self-pay | Admitting: Internal Medicine

## 2020-02-08 NOTE — Telephone Encounter (Signed)
LM for patient to call back about February 23, 2020 visit which is a virtual clinic ONLY - he can keep this visit type (if he has a way to check BP/HR at home and has been doing so) or he will need to r/s for in-office visit.

## 2020-02-23 ENCOUNTER — Encounter: Payer: Medicare PPO | Admitting: Internal Medicine

## 2020-02-23 ENCOUNTER — Encounter: Payer: Self-pay | Admitting: Internal Medicine

## 2020-02-26 NOTE — Progress Notes (Signed)
Erroneous encounter-disregard

## 2020-03-01 ENCOUNTER — Other Ambulatory Visit: Payer: Self-pay | Admitting: Family Medicine

## 2020-03-01 DIAGNOSIS — E785 Hyperlipidemia, unspecified: Secondary | ICD-10-CM

## 2020-03-01 DIAGNOSIS — E1159 Type 2 diabetes mellitus with other circulatory complications: Secondary | ICD-10-CM

## 2020-03-20 DIAGNOSIS — M109 Gout, unspecified: Secondary | ICD-10-CM | POA: Diagnosis not present

## 2020-03-20 DIAGNOSIS — I10 Essential (primary) hypertension: Secondary | ICD-10-CM | POA: Diagnosis not present

## 2020-03-20 DIAGNOSIS — G4733 Obstructive sleep apnea (adult) (pediatric): Secondary | ICD-10-CM | POA: Diagnosis not present

## 2020-03-20 DIAGNOSIS — E785 Hyperlipidemia, unspecified: Secondary | ICD-10-CM | POA: Diagnosis not present

## 2020-03-20 DIAGNOSIS — H9190 Unspecified hearing loss, unspecified ear: Secondary | ICD-10-CM | POA: Diagnosis not present

## 2020-03-20 DIAGNOSIS — G4700 Insomnia, unspecified: Secondary | ICD-10-CM | POA: Diagnosis not present

## 2020-03-20 DIAGNOSIS — F1121 Opioid dependence, in remission: Secondary | ICD-10-CM | POA: Diagnosis not present

## 2020-03-20 DIAGNOSIS — E119 Type 2 diabetes mellitus without complications: Secondary | ICD-10-CM | POA: Diagnosis not present

## 2020-03-22 ENCOUNTER — Other Ambulatory Visit: Payer: Self-pay

## 2020-03-22 ENCOUNTER — Encounter: Payer: Self-pay | Admitting: Internal Medicine

## 2020-03-22 ENCOUNTER — Ambulatory Visit (INDEPENDENT_AMBULATORY_CARE_PROVIDER_SITE_OTHER): Payer: Medicare PPO | Admitting: Internal Medicine

## 2020-03-22 DIAGNOSIS — Z6841 Body Mass Index (BMI) 40.0 and over, adult: Secondary | ICD-10-CM

## 2020-03-22 DIAGNOSIS — G4733 Obstructive sleep apnea (adult) (pediatric): Secondary | ICD-10-CM

## 2020-03-22 DIAGNOSIS — I10 Essential (primary) hypertension: Secondary | ICD-10-CM | POA: Diagnosis not present

## 2020-03-22 NOTE — Progress Notes (Signed)
OFFICE NOTE  Chief Complaint:  Shortness of breath, weight gain, daytime fatigue  Primary Care Physician: Martinique, [name], MD  HPI:  Charles [name] is a pleasant 69 year old male who is originally from [place]. He is here for evaluation of progressive shortness of breath. He does have a history of asthma but recently is been having some cough and shortness of breath. He also has a history of diabetes on insulin, dyslipidemia, hypertension, and PAD. He tells me in 1996 he had a painful toe and was found to have rest pain in the office and was admitted for peripheral angiogram. He underwent a balloon angioplasty and ultimately required a stent per his report. We do not have those records available and we'll try to request them. He has not had follow-up since that time including no peripheral Dopplers. He's never had carotid Dopplers. He says in the past he's had a stress test but it's been many years ago. He denies any chest pain but is progressively more short of breath. He's also morbidly obese.  I saw Charles [name] back in the office today. He's recently been very anxious since he's had an episode of chest discomfort. This was after lifting a heavy couch. He says symptoms were sharp and intermittent during the first day and improved after a couple of days. What constant and did not sound anginal in nature. He did undergo nuclear stress test which was negative for ischemia with normal LV function. He had bilateral lower extremity ultrasounds which show patent vessels and no evidence of a stenosis of his previously placed peripheral stent. He also had bilateral carotid artery Dopplers which indicate mild carotid disease.  04/24/2016  Charles [name] returns today for follow-up. He's had some intermittent episodes of sharp chest pain. This is also associated with neck pain and some pain that radiates down his arms. He also reports some loose stool and diarrhea. This was worse on metformin  however after discontinuing he still has symptoms. He does get some dizziness which I suspect is related to neck pain. He denies any claudication. His Dopplers in August of last year showed normal ABIs and mild bilateral carotid disease. This is due for follow-up in August of next year. He is also overdue for a lipid profile.  03/11/2017  Charles [name] was seen today in follow-up. He reports some occasional palpitations. He denies any chest pain or worsening shortness of breath. His diarrhea that he had previously was resolved. He denies any pain with walking. Blood pressure was elevated today however he just recently took his medicines. A repeat blood pressure check came down to 134/80. 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 He is due for repeat lipid profile. He recently was put on oral blood sugar medication which caused him some shortness of breath and other symptoms however he stopped taking it and the symptoms resolved. He denies any claudication.  05/18/2018  Charles [name] was seen today in follow-up.  He saw his primary care provider Dr. Martinique in January.  At time he was complaining of chest pain.  EKG was performed and was negative.  He was referred back here for follow-up.  He reports today that he continues to have chest discomfort.  Sometimes is worse when moving his neck or lifting his left arm.  He can also have  it at rest or with some exertion.  He is not very physically active.  We had planned to work on weight loss however his weight is essentially unchanged.  We had also previously discussed the possibility of sleep apnea because of significant sleepiness, however he reports that is improved somewhat and feels like it was related to transitioning from working at night to now sleeping at night in his retirement.  Of note he said his brother actually died  recently of a massive heart attack at age 31.  He then mentioned that he was a heavy drug user.  Still, there may be some genetic predisposition to coronary disease.  He has multiple coronary risk factors including type 2 diabetes, hypertension and dyslipidemia as well as morbid obesity.  09/09/2018  Charles [name] returns today for follow-up.  Overall he is asymptomatic, denying any chest pain or worsening shortness of breath.  He denies any symptoms of claudication.  Blood pressures well controlled today.  He remains overweight.  He is cholesterol is at goal with LDL less than 70 however hemoglobin A1c recently increased up to 7.9.  Needs to make significant changes in his diet and reducing carbohydrates.  He did undergo a stress test in June 2019 which showed minimal reversible ischemia which was thought to be low risk and normal LV function.  Given the fact that he is been asymptomatic since then, I would not pursue any additional invasive work-up.  09/04/2019  Charles [name] is seen today for routine annual follow-up.  Overall he is doing well without complaints.  Denies any chest pain.  He does report some shortness of breath with exertion.  Blood pressure is top normal today.  He reports his diabetes has been a little better controlled.  EKG shows a sinus tachycardia with a pulmonary disease pattern.  Weight continues to be an issue and is fairly stable.  He is not currently on beta-blocker.  12/05/2019  Charles [name] returns today for follow-up.  He recently saw Dr. Martinique.  She had recommended increasing his Toprol from 12.5 to 25 mg daily.  He has not done that as he wanted to talk with me first.  He did undergo a sleep study which was on December 7.  I discussed that today with Dr. Claiborne Billings as the report is not yet in epic.  His AHI was 40/h and he will need to be fitted for CPAP.  Likely will get AutoPap.  The durable medical equipment company will reach out to him for this.  03/22/2020  Charles [name] is seen  today again in follow-up.  He still complains of fatigue, weight gain or inability to lose weight and difficulty sleeping.  He did undergo both a home sleep study as well as in the lab test which demonstrated at least mild sleep apnea by AHI.  Ultimately was noted to have more moderate apnea.  He has not however been set up for home health equipment as he said he was confused as to why the medical supplier told him that he had to buy equipment and it was never explained an abnormal sleep study although documentation in epic is clear that he was informed about this.  Nonetheless he likely still has untreated apnea with daytime somnolence, the need for naps and insomnia at night.  Many years ago he used to work at night and sleep during the day.  PMHx:  Past Medical History:  Diagnosis Date  . Abnormal carotid ultrasound 07/2015   mild bilateral  stenosis, repeat in 1 year.  Dr. Debara Pickett  . Diabetes mellitus without complication ([place])   . Gout   . Hepatitis C antibody test positive 05/2015   prior infection, resolved  . History of cardiovascular stress test 9/16   Lexiscan, EF 55%, no ischemia, Dr. Debara Pickett  . Hyperlipidemia   . Hypertension   . Noncompliance   . Obesity   . PVD (peripheral vascular disease) ([place]) 07/2015   normal ABIs, Dr. Debara Pickett    Past Surgical History:  Procedure Laterality Date  . BALLOON ANGIOPLASTY, ARTERY N/A also placed stent  . COLONOSCOPY  2016   Dr. Benson Norway    FAMHx:  Family History  Problem Relation Age of Onset  . Asthma Mother   . Heart attack Mother   . Hypertension Mother   . Hyperlipidemia Mother   . Hypertension Father   . Diabetes Father   . HIV Sister   . Liver disease Brother   . Lung cancer Brother   . Hypertension Brother     SOCHx:   reports that he quit smoking about 24 years ago. His smoking use included cigarettes. He has a 30.00 pack-year smoking history. He has never used smokeless tobacco. He reports that he does not drink alcohol or use  drugs.  ALLERGIES:  Allergies  Allergen Reactions  . Penicillins Other (See Comments)    Per pt blacked out after injection as a child.     ROS: Pertinent items noted in HPI and remainder of comprehensive ROS otherwise negative.  HOME MEDS: Current Outpatient Medications  Medication Sig Dispense Refill  . ACCU-CHEK AVIVA PLUS test strip USE  AS  INSTRUCTED 100 strip 2  . ACCU-CHEK FASTCLIX LANCETS MISC Use to check blood sugars 2-3 times daily.    . Alcohol Swabs (B-D SINGLE USE SWABS REGULAR) PADS 1 Device by Does not apply route daily. 90 each 3  . allopurinol (ZYLOPRIM) 300 MG tablet TAKE 1 TABLET EVERY DAY 90 tablet 1  . amLODipine (NORVASC) 10 MG tablet TAKE 1 TABLET EVERY DAY 90 tablet 1  . aspirin 81 MG tablet Take 81 mg by mouth daily.    . Blood Glucose Monitoring Suppl (ACCU-CHEK AVIVA PLUS) w/Device KIT Use to test blood sugar daily 1 kit 2  . Blood Glucose Monitoring Suppl (ACCU-CHEK NANO SMARTVIEW) w/Device KIT Use as directed.    Marland Kitchen glimepiride (AMARYL) 4 MG tablet TAKE 1 TABLET EVERY DAY BEFORE BREAKFAST 90 tablet 1  . irbesartan (AVAPRO) 75 MG tablet TAKE 1 TABLET EVERY DAY 90 tablet 1  . metFORMIN (GLUCOPHAGE) 500 MG tablet TAKE 1 TABLET IN THE MORNING AND TAKE 2 TABLETS WITH SUPPER 270 tablet 2  . metoprolol succinate (TOPROL-XL) 25 MG 24 hr tablet Take 1 tablet (25 mg total) by mouth daily. 90 tablet 1  . rosuvastatin (CRESTOR) 10 MG tablet TAKE 1 TABLET EVERY DAY 90 tablet 3   No current facility-administered medications for this visit.    LABS/IMAGING: No results found for this or any previous visit (from the past 48 hour(s)). No results found.  WEIGHTS: Wt Readings from Last 3 Encounters:  03/22/20 269 lb 6.4 oz (122.2 kg)  12/05/19 270 lb 3.2 oz (122.6 kg)  11/27/19 272 lb (123.4 kg)    VITALS: BP 130/78   Pulse 88   Temp (!) 97.3 F (36.3 C)   Ht '5\' 5"'  (1.651 m)   Wt 269 lb 6.4 oz (122.2 kg)   SpO2 92%   BMI 44.83 kg/m  EXAM: General  appearance: alert, no distress and morbidly obese Neck: no carotid bruit and no JVD Lungs: clear to auscultation bilaterally Heart: regular rate and rhythm, S1, S2 normal, no murmur, click, rub or gallop Abdomen: morbidly obese Extremities: extremities normal, atraumatic, no cyanosis or edema Pulses: 2+ and symmetric Skin: Skin color, texture, turgor normal. No rashes or lesions Neurologic: Mental status: Alert, oriented, thought content appropriate Psych: Pleasant  EKG: Normal sinus rhythm at 88, possible inferior infarct-personally reviewed  ASSESSMENT: 1. Dyspnea on exertion low risk Myoview with minimal reversible ischemia as of June/2019 2. PAD with prior stent to the right leg in 1996 - normal ABIs 3. Asthma 4. Type 2 diabetes 5. Hypertension 6. Dyslipidemia 7. Morbid obesity 8. Cervicalgia/dizziness 9. OSA - not yet on CPAP  PLAN: 1.   Charles [name] still has difficulty losing weight.  We will refer him to comprehensive weight management.  In addition, he continues to struggle with somnolence and poor sleep at night.  I have reminded him that he is qualified for CPAP and should reach out to the medical supply company to get that fitted.  Pixie Casino, MD, [facility], [place] Director of the Advanced Lipid Disorders &  Cardiovascular Risk Reduction Clinic Diplomate of the American Board of Clinical Lipidology Attending Cardiologist  Direct Dial: [phone]  Fax: [phone]  Website:  [email or web address] 03/22/2020, 2:29 PM

## 2020-03-22 NOTE — Patient Instructions (Signed)
Medication Instructions:  Your physician recommends that you continue on your current medications as directed. Please refer to the Current Medication list given to you today.  *If you need a refill on your cardiac medications before your next appointment, please call your pharmacy*   Follow-Up: At [facility], you and your health needs are our priority.  As part of our continuing mission to provide you with exceptional heart care, we have created designated Provider Care Teams.  These Care Teams include your primary Cardiologist (physician) and Advanced Practice Providers (APPs -  Physician Assistants and Nurse Practitioners) who all work together to provide you with the care you need, when you need it.  We recommend signing up for the patient portal called "MyChart".  Sign up information is provided on this After Visit Summary.  MyChart is used to connect with patients for Virtual Visits (Telemedicine).  Patients are able to view lab/test results, encounter notes, upcoming appointments, etc.  Non-urgent messages can be sent to your provider as well.   To learn more about what you can do with MyChart, go to [email or web address].    Your next appointment:   6 month(s)  The format for your next appointment:   In Person  Provider:   You may see Dr. Rennis Golden or one of the following Advanced Practice Providers on your designated Care Team:    Azalee Course, PA-C  Micah Flesher, PA-C or   Judy Pimple, [place]    Other Instructions You have been referred to Quillian Quince, MD - medical weight management

## 2020-03-27 ENCOUNTER — Other Ambulatory Visit: Payer: Self-pay | Admitting: Family Medicine

## 2020-04-26 ENCOUNTER — Other Ambulatory Visit: Payer: Self-pay | Admitting: Family Medicine

## 2020-05-14 NOTE — Telephone Encounter (Signed)
Sent to [place] [facility] network with [facility].

## 2020-05-17 ENCOUNTER — Emergency Department (HOSPITAL_COMMUNITY): Payer: Medicare PPO

## 2020-05-17 ENCOUNTER — Other Ambulatory Visit: Payer: Self-pay

## 2020-05-17 ENCOUNTER — Encounter (HOSPITAL_COMMUNITY): Payer: Self-pay | Admitting: Emergency Medicine

## 2020-05-17 ENCOUNTER — Emergency Department (HOSPITAL_COMMUNITY)
Admission: EM | Admit: 2020-05-17 | Discharge: 2020-05-17 | Disposition: A | Discharge status: Home / Self Care | Payer: Medicare PPO | Attending: Emergency Medicine | Admitting: Emergency Medicine

## 2020-05-17 DIAGNOSIS — Z79899 Other long term (current) drug therapy: Secondary | ICD-10-CM | POA: Insufficient documentation

## 2020-05-17 DIAGNOSIS — Z87891 Personal history of nicotine dependence: Secondary | ICD-10-CM | POA: Insufficient documentation

## 2020-05-17 DIAGNOSIS — E119 Type 2 diabetes mellitus without complications: Secondary | ICD-10-CM | POA: Insufficient documentation

## 2020-05-17 DIAGNOSIS — Z7984 Long term (current) use of oral hypoglycemic drugs: Secondary | ICD-10-CM | POA: Diagnosis not present

## 2020-05-17 DIAGNOSIS — R0789 Other chest pain: Secondary | ICD-10-CM | POA: Diagnosis not present

## 2020-05-17 DIAGNOSIS — I10 Essential (primary) hypertension: Secondary | ICD-10-CM | POA: Insufficient documentation

## 2020-05-17 DIAGNOSIS — R079 Chest pain, unspecified: Secondary | ICD-10-CM | POA: Diagnosis not present

## 2020-05-17 DIAGNOSIS — R519 Headache, unspecified: Secondary | ICD-10-CM | POA: Diagnosis not present

## 2020-05-17 DIAGNOSIS — R05 Cough: Secondary | ICD-10-CM | POA: Diagnosis not present

## 2020-05-17 DIAGNOSIS — Z7982 Long term (current) use of aspirin: Secondary | ICD-10-CM | POA: Insufficient documentation

## 2020-05-17 DIAGNOSIS — R42 Dizziness and giddiness: Secondary | ICD-10-CM | POA: Diagnosis not present

## 2020-05-17 DIAGNOSIS — Z20822 Contact with and (suspected) exposure to covid-19: Secondary | ICD-10-CM | POA: Diagnosis not present

## 2020-05-17 LAB — HEPATIC FUNCTION PANEL
ALT: 23 U/L (ref 0–44)
AST: 25 U/L (ref 15–41)
Albumin: 4.3 g/dL (ref 3.5–5.0)
Alkaline Phosphatase: 89 U/L (ref 38–126)
Bilirubin, Direct: <0.1 mg/dL (ref 0.0–0.2)
Total Bilirubin: 0.7 mg/dL (ref 0.3–1.2)
Total Protein: 8.5 g/dL — ABNORMAL HIGH (ref 6.5–8.1)

## 2020-05-17 LAB — CBC
HCT: 52.5 % — ABNORMAL HIGH (ref 39.0–52.0)
Hemoglobin: 15.1 g/dL (ref 13.0–17.0)
MCH: 24.2 pg — ABNORMAL LOW (ref 26.0–34.0)
MCHC: 28.8 g/dL — ABNORMAL LOW (ref 30.0–36.0)
MCV: 84 fL (ref 80.0–100.0)
Platelets: 295 10*3/uL (ref 150–400)
RBC: 6.25 MIL/uL — ABNORMAL HIGH (ref 4.22–5.81)
RDW: 17.9 % — ABNORMAL HIGH (ref 11.5–15.5)
WBC: 9.4 10*3/uL (ref 4.0–10.5)
nRBC: 0.2 % (ref 0.0–0.2)

## 2020-05-17 LAB — ETHANOL: Alcohol, Ethyl (B): <10 mg/dL (ref <10)

## 2020-05-17 LAB — BASIC METABOLIC PANEL
Anion gap: 15 (ref 5–15)
BUN: 13 mg/dL (ref 8–23)
CO2: 22 mmol/L (ref 22–32)
Calcium: 9.2 mg/dL (ref 8.9–10.3)
Chloride: 102 mmol/L (ref 98–111)
Creatinine, Ser: 1.1 mg/dL (ref 0.61–1.24)
GFR calc Af Amer: >60 mL/min (ref >60)
GFR calc non Af Amer: >60 mL/min (ref >60)
Glucose, Bld: 126 mg/dL — ABNORMAL HIGH (ref 70–99)
Potassium: 5.3 mmol/L — ABNORMAL HIGH (ref 3.5–5.1)
Sodium: 139 mmol/L (ref 135–145)

## 2020-05-17 LAB — TROPONIN I (HIGH SENSITIVITY)
Troponin I (High Sensitivity): 6 ng/L (ref <18)
Troponin I (High Sensitivity): 5 ng/L (ref <18)

## 2020-05-17 LAB — D-DIMER, QUANTITATIVE: D-Dimer, Quant: 0.37 ug/mL-FEU (ref 0.00–0.50)

## 2020-05-17 MED ORDER — FAMOTIDINE IN NACL 20-0.9 MG/50ML-% IV SOLN
20.0000 mg | Freq: Once | INTRAVENOUS | Status: AC
  Administered 2020-05-17: 20 mg via INTRAVENOUS
  Filled 2020-05-17: qty 50

## 2020-05-17 MED ORDER — SODIUM CHLORIDE 0.9% FLUSH: 3.0000 mL | Freq: Once | INTRAVENOUS | Status: DC

## 2020-05-17 MED ORDER — SUCRALFATE 1 G PO TABS
1.0000 g | ORAL_TABLET | Freq: Three times a day (TID) | ORAL | 0 refills | Status: DC
Start: 2020-05-17 — End: 2020-05-17

## 2020-05-17 MED ORDER — FAMOTIDINE 20 MG PO TABS
20.0000 mg | ORAL_TABLET | Freq: Two times a day (BID) | ORAL | 0 refills | Status: DC
Start: 2020-05-17 — End: 2020-05-17

## 2020-05-17 MED ORDER — FAMOTIDINE 20 MG PO TABS
20.0000 mg | ORAL_TABLET | Freq: Two times a day (BID) | ORAL | 0 refills | Status: DC
Start: 2020-05-17 — End: 2021-05-20

## 2020-05-17 MED ORDER — KETOROLAC TROMETHAMINE 30 MG/ML IJ SOLN
15.0000 mg | Freq: Once | INTRAMUSCULAR | Status: AC
  Administered 2020-05-17: 15 mg via INTRAVENOUS
  Filled 2020-05-17: qty 1

## 2020-05-17 MED ORDER — MORPHINE SULFATE (PF) 2 MG/ML IV SOLN
2.0000 mg | Freq: Once | INTRAVENOUS | Status: AC
  Administered 2020-05-17: 2 mg via INTRAVENOUS
  Filled 2020-05-17: qty 1

## 2020-05-17 MED ORDER — SUCRALFATE 1 GM/10ML PO SUSP
1.0000 g | Freq: Once | ORAL | Status: AC
  Administered 2020-05-17: 1 g via ORAL
  Filled 2020-05-17: qty 10

## 2020-05-17 MED ORDER — SUCRALFATE 1 G PO TABS
1.0000 g | ORAL_TABLET | Freq: Three times a day (TID) | ORAL | 0 refills | Status: DC
Start: 2020-05-17 — End: 2020-09-24

## 2020-05-17 MED ORDER — IOHEXOL 350 MG/ML SOLN
100.0000 mL | Freq: Once | INTRAVENOUS | Status: AC | PRN
  Administered 2020-05-17: 100 mL via INTRAVENOUS

## 2020-05-17 NOTE — Discharge Instructions (Signed)
As discussed, your evaluation today has been largely reassuring.  But, it is important that you monitor your condition carefully, and do not hesitate to return to the ED if you develop new, or concerning changes in your condition.  Your pain may be due to muscle strain or irritation of your stomach/esophagus.  You are starting a short course of medication to treat these entities.  Please follow-up with your physicians for appropriate ongoing care.

## 2020-05-17 NOTE — ED Provider Notes (Signed)
[facility] DEPT Provider Note   CSN: [id number] Arrival date & time: 05/17/20  1024     History Chief Complaint  Patient presents with  . Chest Pain  . Headache    Charles [name] is a 69 y.o. male.  HPI    Patient presents with concern of chest pain, dizziness. Patient has multiple medical issues, but notes that he was generally well until about a week ago.  Now, since that time he has had pain persistently in the left parasternal area.  Pain is tight, sharp, severe.  Pain is occasionally worse with arm motion bilaterally, though not consistently so. No relieving factors Patient states that he initially thought it was gas pain, but as it was persistent for over a week, and he had an episode of headache, dizziness earlier in the day, he presents for evaluation. Though the patient has seen a cardiologist, he denies history of cardiac disease. Patient is a former smoker, though he quit about 25 years ago.  Past Medical History:  Diagnosis Date  . Abnormal carotid ultrasound 07/2015   mild bilateral stenosis, repeat in 1 year.  Dr. Debara Pickett  . Diabetes mellitus without complication ([place])   . Gout   . Hepatitis C antibody test positive 05/2015   prior infection, resolved  . History of cardiovascular stress test 9/16   Lexiscan, EF 55%, no ischemia, Dr. Debara Pickett  . Hyperlipidemia   . Hypertension   . Noncompliance   . Obesity   . PVD (peripheral vascular disease) ([place]) 07/2015   normal ABIs, Dr. Debara Pickett    Patient Active Problem List   Diagnosis Date Noted  . Morbid obesity ([place]) 09/09/2018  . Chest pain 05/18/2018  . GERD (gastroesophageal reflux disease) 12/28/2017  . Chronic lower back pain 05/07/2017  . Excessive daytime sleepiness 03/11/2017  . Noncompliance 02/27/2016  . Chronic neck pain 02/27/2016  . AP (abdominal pain) 02/27/2016  . Cervical radiculitis 02/27/2016  . Left hand paresthesia 02/27/2016  . Diabetes mellitus with  complication ([place]) 49/17/9150  . Knee pain, acute 11/29/2015  . Need for prophylactic vaccination and inoculation against influenza 11/29/2015  . Cervical pain (neck) 11/29/2015  . Weight gain 11/29/2015  . Primary gout 11/29/2015  . PAD (peripheral artery disease) ([place]) 07/31/2015  . Shortness of breath 07/31/2015  . Asthma 07/31/2015  . Morbid obesity with BMI of 40.0-44.9, adult ([place]) 06/10/2015  . Hyperlipidemia 06/10/2015  . Type 2 diabetes mellitus with vascular disease ([place]) 10/23/2014  . Atherosclerosis of aorta ([place]) 10/23/2014  . Acute idiopathic gout of multiple sites 10/23/2014  . Arcus senilis of both eyes 10/23/2014  . Diabetes type 2, uncontrolled ([place]) 10/09/2014  . Essential hypertension 10/09/2014  . Pain in joint, ankle and foot 10/09/2014    Past Surgical History:  Procedure Laterality Date  . BALLOON ANGIOPLASTY, ARTERY N/A also placed stent  . COLONOSCOPY  2016   Dr. Benson Norway       Family History  Problem Relation Age of Onset  . Asthma Mother   . Heart attack Mother   . Hypertension Mother   . Hyperlipidemia Mother   . Hypertension Father   . Diabetes Father   . HIV Sister   . Liver disease Brother   . Lung cancer Brother   . Hypertension Brother     Social History   Tobacco Use  . Smoking status: Former Smoker    Packs/day: 1.00    Years: 30.00    Pack years: 30.00  Types: Cigarettes    Quit date: 07/31/1995    Years since quitting: 24.8  . Smokeless tobacco: Never Used  Substance Use Topics  . Alcohol use: No    Alcohol/week: 0.0 standard drinks  . Drug use: No    Home Medications Prior to Admission medications   Medication Sig Start Date End Date Taking? Authorizing Provider  allopurinol (ZYLOPRIM) 300 MG tablet TAKE 1 TABLET EVERY DAY 03/27/20  Yes Martinique, [name], MD  amLODipine (NORVASC) 10 MG tablet TAKE 1 TABLET EVERY DAY 03/27/20  Yes Martinique, [name], MD  aspirin 81 MG tablet Take 81 mg by mouth every evening.    Yes [provider]  glimepiride (AMARYL) 4 MG tablet TAKE 1 TABLET EVERY DAY BEFORE BREAKFAST Patient taking differently: Take 4 mg by mouth daily with breakfast. TAKE 1 TABLET EVERY DAY BEFORE BREAKFAST 03/01/20  Yes Martinique, [name], MD  irbesartan (AVAPRO) 75 MG tablet TAKE 1 TABLET EVERY DAY 01/01/20  Yes Martinique, [name], MD  metFORMIN (GLUCOPHAGE) 500 MG tablet TAKE 1 TABLET IN THE MORNING AND TAKE 2 TABLETS WITH SUPPER 03/01/20  Yes Martinique, [name], MD  metoprolol succinate (TOPROL-XL) 25 MG 24 hr tablet TAKE 1 TABLET EVERY DAY 04/26/20  Yes Martinique, [name], MD  rosuvastatin (CRESTOR) 10 MG tablet TAKE 1 TABLET EVERY DAY 03/01/20  Yes Martinique, [name], MD  ACCU-CHEK AVIVA PLUS test strip USE  AS  INSTRUCTED 03/01/20   Martinique, [name], MD  ACCU-CHEK FASTCLIX LANCETS MISC Use to check blood sugars 2-3 times daily.    [provider]  Alcohol Swabs (B-D SINGLE USE SWABS REGULAR) PADS 1 Device by Does not apply route daily. 06/24/17   Renato Shin, MD  Blood Glucose Monitoring Suppl (ACCU-CHEK AVIVA PLUS) w/Device KIT Use to test blood sugar daily 01/27/19   Martinique, [name], MD  Blood Glucose Monitoring Suppl (ACCU-CHEK NANO SMARTVIEW) w/Device KIT Use as directed. 07/14/17   [provider]    Allergies    Penicillins  Review of Systems   Review of Systems  Constitutional:       Per HPI, otherwise negative  HENT:       Per HPI, otherwise negative  Respiratory:       Per HPI, otherwise negative  Cardiovascular:       Per HPI, otherwise negative  Gastrointestinal: Negative for vomiting.  Endocrine:       Negative aside from HPI  Genitourinary:       Neg aside from HPI   Musculoskeletal:       Per HPI, otherwise negative  Skin: Negative.   Neurological: Negative for syncope.    Physical Exam Updated Vital Signs BP (!) 157/126   Pulse 84   Temp 99 F (37.2 C) (Oral)   Resp 13   Wt 116.6 kg   SpO2 98%   BMI 42.77 kg/m   Physical Exam Vitals and nursing note reviewed.   Constitutional:      General: He is not in acute distress.    Appearance: He is obese.  HENT:     Head: Normocephalic and atraumatic.  Eyes:     Conjunctiva/sclera: Conjunctivae normal.  Cardiovascular:     Rate and Rhythm: Normal rate and regular rhythm.  Pulmonary:     Effort: Pulmonary effort is normal. No respiratory distress.     Breath sounds: No stridor.  Abdominal:     General: There is no distension.  Skin:    General:  Skin is warm and dry.  Neurological:     Mental Status: He is alert and oriented to person, place, and time.     ED Results / Procedures / Treatments   Labs (all labs ordered are listed, but only abnormal results are displayed) Labs Reviewed  CBC - Abnormal; Notable for the following components:      Result Value   RBC 6.25 (*)    HCT 52.5 (*)    MCH 24.2 (*)    MCHC 28.8 (*)    RDW 17.9 (*)    All other components within normal limits  HEPATIC FUNCTION PANEL - Abnormal; Notable for the following components:   Total Protein 8.5 (*)    All other components within normal limits  BASIC METABOLIC PANEL - Abnormal; Notable for the following components:   Potassium 5.3 (*)    Glucose, Bld 126 (*)    All other components within normal limits  ETHANOL  D-DIMER, QUANTITATIVE (NOT AT [facility])  TROPONIN I (HIGH SENSITIVITY)  TROPONIN I (HIGH SENSITIVITY)    EKG EKG Interpretation  Date/Time:  Friday May 17 2020 10:48:22 EDT Ventricular Rate:  98 PR Interval:  158 QRS Duration: 80 QT Interval:  352 QTC Calculation: 449 R Axis:   -93 Text Interpretation: Normal sinus rhythm Right superior axis deviation Abnormal ECG Confirmed by Carmin Muskrat [phone]) on 05/17/2020 4:17:24 PM   Radiology DG Chest 2 View  Result Date: 05/17/2020 CLINICAL DATA:  Chest pain and cough for 1 week EXAM: CHEST - 2 VIEW COMPARISON:  08/26/14 FINDINGS: Normal heart size. Aortic atherosclerosis. Both lungs are clear. The visualized skeletal structures are unremarkable.  IMPRESSION: No active cardiopulmonary abnormalities. Electronically Signed   By: Kerby Moors M.D.   On: 05/17/2020 11:38   CT ANGIO CHEST AORTA W/CM & OR WO/CM  Result Date: 05/17/2020 CLINICAL DATA:  Chest pain headache for 1 week EXAM: CT ANGIOGRAPHY CHEST WITH CONTRAST TECHNIQUE: Multidetector CT imaging of the chest was performed using the standard protocol during bolus administration of intravenous contrast. Multiplanar CT image reconstructions and MIPs were obtained to evaluate the vascular anatomy. CONTRAST:  155m OMNIPAQUE IOHEXOL 350 MG/ML SOLN COMPARISON:  None. FINDINGS: Cardiovascular: Slightly suboptimal opacification of the main pulmonary artery. No central or segmental pulmonary embolism. The heart is normal in size. No pericardial effusion or thickening. No evidence right heart strain. There is normal three-vessel brachiocephalic anatomy without proximal stenosis. Scattered aortic atherosclerotic calcifications are seen without aneurysmal dilatation. Coronary artery calcifications are seen. Mediastinum/Nodes: No hilar, mediastinal, or axillary adenopathy. Thyroid gland, trachea, and esophagus demonstrate no significant findings. Lungs/Pleura: The lungs are clear. No pleural effusion or pneumothorax. No airspace consolidation. Upper Abdomen: No acute abnormalities present in the visualized portions of the upper abdomen. Musculoskeletal: No chest wall abnormality. No acute or significant osseous findings. Review of the MIP images confirms the above findings. IMPRESSION: Slightly suboptimal opacification of the main pulmonary artery. No central or segmental pulmonary embolism. No other acute intrathoracic pathology to explain the patient's symptoms. Aortic Atherosclerosis (ICD10-I70.0). Electronically Signed   By: BPrudencio PairM.D.   On: 05/17/2020 21:17    Procedures Procedures (including critical care time)  Medications Ordered in ED Medications  sodium chloride flush (NS) 0.9 %  injection 3 mL (0 mLs Intravenous Hold 05/17/20 1835)  famotidine (PEPCID) IVPB 20 mg premix (20 mg Intravenous New Bag/Given 05/17/20 2107)  ketorolac (TORADOL) 30 MG/ML injection 15 mg (15 mg Intravenous Given 05/17/20 1835)  sucralfate (CARAFATE) 1 GM/10ML suspension  1 g (1 g Oral Given 05/17/20 2106)  morphine 2 MG/ML injection 2 mg (2 mg Intravenous Given 05/17/20 2047)  iohexol (OMNIPAQUE) 350 MG/ML injection 100 mL (100 mLs Intravenous Contrast Given 05/17/20 2054)    ED Course  I have reviewed the triage vital signs and the nursing notes.  Pertinent labs & imaging results that were available during my care of the patient were reviewed by me and considered in my medical decision making (see chart for details).     Patient with multiple medical issues presents with new chest pain.  Line patient is awake, alert, in no distress, speaking clearly, is initially hemodynamically stable.  On however, given his elevated risk profile, chest pain, broad differential including ACS, PE, pneumonia, musculoskeletal all considered. Labs, x-ray ordered, Toradol provided.  8:44 PM Patient continues to have pain.  Initial labs, including troponin, D-dimer, chemistry panel all reviewed, essentially unremarkable, with a negative D-dimer, troponin which is negligible, and with nonischemic EKG, low suspicion for atypical ACS. X-ray does not suggest pneumonia, but there is evidence for atherosclerotic disease. With persistent chest pain, angiography pending.  9:34 PM CT results reviewed, discussed with the patient and his son. Now on repeat exam the patient continues to appear comfortable, though he does have some ongoing mild pain.  They also describe patient's ongoing efforts to control neck and shoulder pain.  After lengthy conversation about today's results including 2 normal troponin, negative D-dimer, reassuring CT without evidence for aortic dissection or aneurysm, patient will be discharged to follow-up  with primary care and orthopedics.  Final Clinical Impression(s) / ED Diagnoses Final diagnoses:  Atypical chest pain    Rx / DC Orders ED Discharge Orders         Ordered    famotidine (PEPCID) 20 MG tablet  2 times daily,   Status:  Discontinued     05/17/20 2136    sucralfate (CARAFATE) 1 g tablet  3 times daily with meals & bedtime,   Status:  Discontinued    Note to Pharmacy: Take for one week   05/17/20 2136    famotidine (PEPCID) 20 MG tablet  2 times daily     05/17/20 2137    sucralfate (CARAFATE) 1 g tablet  3 times daily with meals & bedtime    Note to Pharmacy: Take for one week   05/17/20 2137           Carmin Muskrat, MD 05/17/20 2137

## 2020-05-17 NOTE — ED Notes (Signed)
Rn attempted to start IV x1 without success. Some blood work obtained. Patient reports a hx of drug abuse as a younger man that can make starting IV's difficult.

## 2020-05-17 NOTE — ED Notes (Signed)
Patient has been stuck 4 additional times in an attempt to obtain an IV. IV team attempted x3 without success. MD Jeraldine Loots aware

## 2020-05-17 NOTE — ED Triage Notes (Signed)
Pt c/o left chest pains and headache x week.

## 2020-05-22 ENCOUNTER — Other Ambulatory Visit: Payer: Self-pay | Admitting: Family Medicine

## 2020-05-22 DIAGNOSIS — I10 Essential (primary) hypertension: Secondary | ICD-10-CM

## 2020-05-22 DIAGNOSIS — E1159 Type 2 diabetes mellitus with other circulatory complications: Secondary | ICD-10-CM

## 2020-05-24 ENCOUNTER — Other Ambulatory Visit: Payer: Self-pay

## 2020-05-27 ENCOUNTER — Ambulatory Visit: Payer: Medicare PPO | Admitting: Family Medicine

## 2020-06-03 ENCOUNTER — Telehealth: Payer: Self-pay | Admitting: *Deleted

## 2020-06-03 NOTE — Telephone Encounter (Signed)
Per Lurena Joiner @ [facility] patient was contacted multiple times to get set up on CPAP. She was told to "not to call anymore." he would call back. He was undecided about using CPAP.

## 2020-06-03 NOTE — Telephone Encounter (Signed)
-----   Message from Charles Spar, RN sent at 06/03/2020  1:26 PM EDT ----- Regarding: RE: please contact patient about CPAP Thanks for the update. Did you document this in a phone note too? ----- Message ----- From: Gaynelle Cage, CMA Sent: 06/03/2020  10:43 AM EDT To: Chrystie Nose, MD, Charles Spar, RN Subject: RE: please contact patient about CPAP          Charles [name] sent the patient to [facility] because they were out of network. Per Lurena Joiner @ [facility] they contacted the patient on  1/5 1/10 1/12 1/20 He asked for them to not call him anymore, he was undecided if he was going to start therapy. He would call them when he was ready to start. If the patient decides he wants to start he will now have to start ALLLL the way over with new sleep study which I can tell you he is not going to do. ----- Message ----- From: Charles Spar, RN Sent: 03/22/2020   2:47 PM EDT To: Gaynelle Cage, CMA Subject: please contact patient about CPAP              Tera Mater -   This patient was in the office today 03/22/20. He and Dr. Rennis Golden talked about CPAP, he is not using, not sure he has equipment. We saw your notes where referral was sent to [name]. Can you please follow up with patient on this?  Thanks  [place]

## 2020-06-10 ENCOUNTER — Encounter: Payer: Self-pay | Admitting: Family Medicine

## 2020-06-10 ENCOUNTER — Other Ambulatory Visit: Payer: Self-pay

## 2020-06-10 ENCOUNTER — Ambulatory Visit (INDEPENDENT_AMBULATORY_CARE_PROVIDER_SITE_OTHER): Payer: Medicare PPO | Admitting: Family Medicine

## 2020-06-10 ENCOUNTER — Ambulatory Visit: Payer: Medicare PPO

## 2020-06-10 VITALS — BP 122/70 | HR 101 | Temp 97.4°F | Resp 12 | Ht 65.0 in | Wt 269.1 lb

## 2020-06-10 DIAGNOSIS — E1159 Type 2 diabetes mellitus with other circulatory complications: Secondary | ICD-10-CM | POA: Diagnosis not present

## 2020-06-10 DIAGNOSIS — E876 Hypokalemia: Secondary | ICD-10-CM | POA: Diagnosis not present

## 2020-06-10 DIAGNOSIS — G4733 Obstructive sleep apnea (adult) (pediatric): Secondary | ICD-10-CM

## 2020-06-10 DIAGNOSIS — Z6841 Body Mass Index (BMI) 40.0 and over, adult: Secondary | ICD-10-CM

## 2020-06-10 DIAGNOSIS — I10 Essential (primary) hypertension: Secondary | ICD-10-CM | POA: Diagnosis not present

## 2020-06-10 DIAGNOSIS — M4692 Unspecified inflammatory spondylopathy, cervical region: Secondary | ICD-10-CM | POA: Diagnosis not present

## 2020-06-10 DIAGNOSIS — I739 Peripheral vascular disease, unspecified: Secondary | ICD-10-CM | POA: Diagnosis not present

## 2020-06-10 LAB — POCT GLYCOSYLATED HEMOGLOBIN (HGB A1C): Hemoglobin A1C: 8.2 % — AB (ref 4.0–5.6)

## 2020-06-10 LAB — POTASSIUM: Potassium: 3.9 mEq/L (ref 3.5–5.1)

## 2020-06-10 MED ORDER — EMPAGLIFLOZIN 10 MG PO TABS: 10.0000 mg | ORAL_TABLET | Freq: Every day | ORAL | 4 refills | Status: DC

## 2020-06-10 NOTE — Assessment & Plan Note (Signed)
HgA1C is not at goal. We have some limitations in regard to treatment due to insurance coverage. He is not interested in basal insulin. He met with pharmacists today to discuss medication saving options  Recommend trying pt assistance for Trulicity. I sent Rx for Jardiance 10 mg to take daily. No changes in Metformin or Glimepiride. We discussed some adverse effects of poorly controlled glucose.  Regular exercise and healthy diet with avoidance of added sugar food intake is an important part of treatment and recommended. Annual eye exam, periodic dental and foot care recommended. F/U in 3 months

## 2020-06-10 NOTE — Progress Notes (Signed)
HPI: Charles [name] is a 69 y.o. male, who is here today for chronic disease management. He was last seen on 11/21/19. Recently dx'ed with OSA. He is doubtful about Dx, states that he was "awake all night." He sleeps during the day, used to work at night.  He was also asked for money and monthly co-pay for CPAP machine, he cannot not afford it.  DM II:  Dx'ed around 2011. Negative for polydipsia,polyuria, or polyphagia. We have had some limitations in regard to med coverage. He is on Metformin 500 mg am and 1000 mg and Amaryl 4 mg daily. He has not had hypoglycemic events.  Lab Results  Component Value Date   HGBA1C 7.6 (H) 09/05/2019   Lab Results  Component Value Date   MICROALBUR 3.6 (H) 09/05/2019   MICROALBUR 1.4 06/06/2018   He has not been consistent with following dietary recommendations. Chocolate,cupcakes,cookies.  HTN: Dx'ed around 2008. On Avapro 75 mg daily, Amlodipine 10 mg daily,and Metoprolol succinate 25 mg daily.  Negative for severe/frequent headache, visual changes, chest pain, worsening dyspnea, palpitation, claudication, focal weakness, or unusual edema. Last BMP with hyperK+.  + PAD. Carotid US 07/2015: 1-39% stenosis of ICA.  In 1996 he underwent balloon angiography and stenting LE. ABI in 07/2015: Normal ABI's, bilaterally. No evidence of segmental lower extremity arterial disease at rest, bilaterally. The right great toes TBI is abnormal. The left great toe TBI is normal.  Follows with cardiologist.  Lab Results  Component Value Date   CREATININE 1.10 05/17/2020   BUN 13 05/17/2020   NA 139 05/17/2020   K 5.3 (H) 05/17/2020   CL 102 05/17/2020   CO2 22 05/17/2020   Chronic back pain, cervicalgia.  Stable for years. This morning he noted left post rib cage pain. Exacerbated with movement. He has not noted rash,edema,or erythema. No hx of trauma.  Review of Systems  Constitutional: Positive for fatigue. Negative for  activity change, appetite change and fever.  HENT: Negative for nosebleeds and sore throat.   Eyes: Negative for redness and visual disturbance.  Respiratory: Negative for cough and wheezing.   Gastrointestinal: Negative for abdominal pain, nausea and vomiting.  Genitourinary: Negative for decreased urine volume and hematuria.  Musculoskeletal: Positive for arthralgias and gait problem.  Neurological: Negative for syncope and facial asymmetry.  Psychiatric/Behavioral: Negative for behavioral problems and confusion.  Rest of ROS, see pertinent positives sand negatives in HPI  Current Outpatient Medications on File Prior to Visit  Medication Sig Dispense Refill  . ACCU-CHEK AVIVA PLUS test strip USE  AS  INSTRUCTED 100 strip 2  . ACCU-CHEK FASTCLIX LANCETS MISC Use to check blood sugars 2-3 times daily.    . Alcohol Swabs (B-D SINGLE USE SWABS REGULAR) PADS 1 Device by Does not apply route daily. 90 each 3  . allopurinol (ZYLOPRIM) 300 MG tablet TAKE 1 TABLET EVERY DAY 90 tablet 1  . amLODipine (NORVASC) 10 MG tablet TAKE 1 TABLET EVERY DAY 90 tablet 1  . aspirin 81 MG tablet Take 81 mg by mouth every evening.     . Blood Glucose Monitoring Suppl (ACCU-CHEK AVIVA PLUS) w/Device KIT Use to test blood sugar daily 1 kit 2  . Blood Glucose Monitoring Suppl (ACCU-CHEK NANO SMARTVIEW) w/Device KIT Use as directed.    Marland Kitchen glimepiride (AMARYL) 4 MG tablet TAKE 1 TABLET EVERY DAY BEFORE BREAKFAST (Patient taking differently: Take 4 mg by mouth daily with breakfast. TAKE 1 TABLET EVERY DAY BEFORE BREAKFAST) 90  tablet 1  . irbesartan (AVAPRO) 75 MG tablet TAKE 1 TABLET EVERY DAY 90 tablet 1  . metFORMIN (GLUCOPHAGE) 500 MG tablet TAKE 1 TABLET IN THE MORNING AND TAKE 2 TABLETS WITH SUPPER 270 tablet 2  . metoprolol succinate (TOPROL-XL) 25 MG 24 hr tablet TAKE 1 TABLET EVERY DAY 90 tablet 1  . rosuvastatin (CRESTOR) 10 MG tablet TAKE 1 TABLET EVERY DAY 90 tablet 3  . famotidine (PEPCID) 20 MG tablet Take  1 tablet (20 mg total) by mouth 2 (two) times daily for 10 days. 30 tablet 0  . sucralfate (CARAFATE) 1 g tablet Take 1 tablet (1 g total) by mouth 4 (four) times daily -  with meals and at bedtime. (Patient not taking: Reported on 06/10/2020) 21 tablet 0   No current facility-administered medications on file prior to visit.   Past Medical History:  Diagnosis Date  . Abnormal carotid ultrasound 07/2015   mild bilateral stenosis, repeat in 1 year.  Dr. Debara Pickett  . Diabetes mellitus without complication ([place])   . Gout   . Hepatitis C antibody test positive 05/2015   prior infection, resolved  . History of cardiovascular stress test 9/16   Lexiscan, EF 55%, no ischemia, Dr. Debara Pickett  . Hyperlipidemia   . Hypertension   . Noncompliance   . Obesity   . PVD (peripheral vascular disease) ([place]) 07/2015   normal ABIs, Dr. Debara Pickett   Allergies  Allergen Reactions  . Penicillins Other (See Comments)    Per pt blacked out after injection as a child.  Did it involve swelling of the face/tongue/throat, SOB, or low BP? N Did it involve sudden or severe rash/hives, skin peeling, or any reaction on the inside of your mouth or nose? N Did you need to seek medical attention at a hospital or doctor's office? N When did it last happen?Several Decades Ago If all above answers are "NO", may proceed with cephalosporin use.     Social History   Socioeconomic History  . Marital status: Divorced    Spouse name: Not on file  . Number of children: Not on file  . Years of education: Not on file  . Highest education level: Not on file  Occupational History  . Not on file  Tobacco Use  . Smoking status: Former Smoker    Packs/day: 1.00    Years: 30.00    Pack years: 30.00    Types: Cigarettes    Quit date: 07/31/1995    Years since quitting: 24.8  . Smokeless tobacco: Never Used  Vaping Use  . Vaping Use: Never used  Substance and Sexual Activity  . Alcohol use: No    Alcohol/week: 0.0 standard  drinks  . Drug use: No  . Sexual activity: Not on file  Other Topics Concern  . Not on file  Social History Narrative  . Not on file   Social Determinants of Health   Financial Resource Strain:   . Difficulty of Paying Living Expenses:   Food Insecurity:   . Worried About Charity fundraiser in the Last Year:   . Arboriculturist in the Last Year:   Transportation Needs:   . Film/video editor (Medical):   Marland Kitchen Lack of Transportation (Non-Medical):   Physical Activity:   . Days of Exercise per Week:   . Minutes of Exercise per Session:   Stress:   . Feeling of Stress :   Social Connections:   . Frequency of Communication with Friends  and Family:   . Frequency of Social Gatherings with Friends and Family:   . Attends Religious Services:   . Active Member of Clubs or Organizations:   . Attends Archivist Meetings:   Marland Kitchen Marital Status:     Vitals:   06/10/20 1123  BP: 122/70  Pulse: (!) 101  Resp: 12  Temp: (!) 97.4 F (36.3 C)  SpO2: 97%   Body mass index is 44.78 kg/m.  Physical Exam  Nursing note and vitals reviewed. Constitutional: He is oriented to person, place, and time. He appears well-developed. No distress.  HENT:  Head: Normocephalic and atraumatic.  Mouth/Throat: Mucous membranes are moist.  Eyes: Pupils are equal, round, and reactive to light. Conjunctivae are normal.  Cardiovascular: Normal rate and regular rhythm.  No murmur heard. HR 96/min. DP pulses present ,bilateral.  Respiratory: Effort normal and breath sounds normal. No respiratory distress.  GI: Soft. He exhibits no mass. There is no abdominal tenderness.  Musculoskeletal:     Cervical back: Spasms and tenderness present. No erythema.       Back:  Lymphadenopathy:    He has no cervical adenopathy.  Neurological: He is alert and oriented to person, place, and time. No cranial nerve deficit.  Stable gait, not assisted.  Skin: Skin is warm. No rash noted. No erythema.    Psychiatric: Mood and affect normal.  Well groomed, good eye contact.   ASSESSMENT AND PLAN:  Charles [name] was seen today for chronic disease management.  Orders Placed This Encounter  Procedures  . Potassium  . POC HgB A1c   Lab Results  Component Value Date   HGBA1C 8.2 (A) 06/10/2020    Essential hypertension BP adequately controled. No changes in current management. Due for eye exam. Try to monitor BP at home. Low salt diet also recommended.   Type 2 diabetes mellitus with vascular disease ([place]) HgA1C is not at goal. We have some limitations in regard to treatment due to insurance coverage. He is not interested in basal insulin. He met with pharmacists today to discuss medication saving options  Recommend trying pt assistance for Trulicity. I sent Rx for Jardiance 10 mg to take daily. No changes in Metformin or Glimepiride. We discussed some adverse effects of poorly controlled glucose.  Regular exercise and healthy diet with avoidance of added sugar food intake is an important part of treatment and recommended. Annual eye exam, periodic dental and foot care recommended. F/U in 3 months   Morbid obesity with BMI of 40.0-44.9, adult ([place]) We discussed benefits of wt loss as well as adverse effects of obesity. OSA can make more difficult to lose wt. Consistency with healthy diet and physical activity as tolerated recommended.   OSA (obstructive sleep apnea) We discussed adverse effects of problem is not appropriately treated. Strongly recommend trying to reconsider CPAP use and to call his health insurance to inquire for options. Wt loss will help.  PAD (peripheral artery disease) (H[facility]) Continue Crestor 10 mg daily and Aspirin 81 mg daily. Adequate skin care.  Unspecified inflammatory spondylopathy, cervical region (HCC) Local massage. Tylenol 500 mg 3-4 times per day.   Return in about 3 months (around 09/13/2020) for DM II,wt.Marland Kitchen    Atalaya Zappia G.  Martin[place], MD  [facility]. [place] office.   A few things to remember from today's visit:  Jardiance sent to your pharmacy.  Please ask pharmacist if Trulicity or similar med is covered under your plan. We need to add  another agent. Other options are Jardiance,Januvia,insulin.  No changes in blood pressure medication.  Bring food diary next visit.  Patient assistance program is a good option. You can go to the pharmaceutical companies and see if you can print forms.   If you need refills please call your pharmacy. Do not use My Chart to request refills or for acute issues that need immediate attention.    Please be sure medication list is accurate. If a new problem present, please set up appointment sooner than planned today.

## 2020-06-10 NOTE — Chronic Care Management (AMB) (Signed)
  Chronic Care Management   Outreach Note  06/19/2020 Name: Charles [name] MRN: [id number] DOB: October 23, 1951  Referred by: [place], [name], MD Reason for referral : Chronic Care Management  Patient did not consented to CCM due to deductible.   Patient referred for assistance for medication cost.   Reviewed patient's insurance formulary.  Patient has a deductible of ~$300 for tier 3/ 4 medications (all brand name medications: Trulicity, Jardiance, etc.)  After talking to patient, he states he does not have the ability to pay that deductible.  He was told to pay a $300+ deductible for Jardiance from [facility] mail order.   Per recommendation from Dr. [place], started patient in patient assistance program for Trulicity [employer] foundation).   Instructed patient, he needs to fill out application (patient section) and bring back to clinic along with copy of proof-of-income documentation.   Follow Up Plan:  Pending application items from patient.  Provided provider/ prescriber section for Dr. [place] to sign.   Will fax to [facility] once application is received from patient.   Laural Benes, PharmD Clinical Pharmacist [facility] [place] [phone]

## 2020-06-10 NOTE — Assessment & Plan Note (Signed)
We discussed benefits of wt loss as well as adverse effects of obesity. OSA can make more difficult to lose wt. Consistency with healthy diet and physical activity as tolerated recommended.

## 2020-06-10 NOTE — Assessment & Plan Note (Addendum)
BP adequately controled. No changes in current management. Due for eye exam. Try to monitor BP at home. Low salt diet also recommended.

## 2020-06-10 NOTE — Patient Instructions (Addendum)
A few things to remember from today's visit:  Jardiance sent to your pharmacy.  Please ask pharmacist if Trulicity or similar med is covered under your plan. We need to add another agent. Other options are Jardiance,Januvia,insulin.  No changes in blood pressure medication.  Bring food diary next visit.  Patient assistance program is a good option. You can go to the pharmaceutical companies and see if you can print forms.   If you need refills please call your pharmacy. Do not use My Chart to request refills or for acute issues that need immediate attention.    Please be sure medication list is accurate. If a new problem present, please set up appointment sooner than planned today.

## 2020-06-13 DIAGNOSIS — M4692 Unspecified inflammatory spondylopathy, cervical region: Secondary | ICD-10-CM | POA: Insufficient documentation

## 2020-06-18 ENCOUNTER — Encounter (INDEPENDENT_AMBULATORY_CARE_PROVIDER_SITE_OTHER): Payer: Self-pay

## 2020-07-09 DIAGNOSIS — E119 Type 2 diabetes mellitus without complications: Secondary | ICD-10-CM | POA: Diagnosis not present

## 2020-07-09 DIAGNOSIS — H25813 Combined forms of age-related cataract, bilateral: Secondary | ICD-10-CM | POA: Diagnosis not present

## 2020-07-11 ENCOUNTER — Other Ambulatory Visit: Payer: Self-pay | Admitting: Family Medicine

## 2020-08-01 ENCOUNTER — Other Ambulatory Visit: Payer: Self-pay | Admitting: Family Medicine

## 2020-09-10 ENCOUNTER — Encounter: Payer: Self-pay | Admitting: Family Medicine

## 2020-09-10 ENCOUNTER — Ambulatory Visit: Payer: Medicare PPO | Admitting: Family Medicine

## 2020-09-10 ENCOUNTER — Other Ambulatory Visit: Payer: Self-pay

## 2020-09-10 VITALS — BP 126/82 | HR 95 | Temp 98.4°F | Resp 16 | Ht 65.0 in | Wt 270.0 lb

## 2020-09-10 DIAGNOSIS — Z0001 Encounter for general adult medical examination with abnormal findings: Secondary | ICD-10-CM

## 2020-09-10 DIAGNOSIS — E1159 Type 2 diabetes mellitus with other circulatory complications: Secondary | ICD-10-CM | POA: Diagnosis not present

## 2020-09-10 DIAGNOSIS — E785 Hyperlipidemia, unspecified: Secondary | ICD-10-CM

## 2020-09-10 DIAGNOSIS — Z6841 Body Mass Index (BMI) 40.0 and over, adult: Secondary | ICD-10-CM | POA: Diagnosis not present

## 2020-09-10 DIAGNOSIS — I10 Essential (primary) hypertension: Secondary | ICD-10-CM

## 2020-09-10 DIAGNOSIS — Z0000 Encounter for general adult medical examination without abnormal findings: Secondary | ICD-10-CM

## 2020-09-10 DIAGNOSIS — H6123 Impacted cerumen, bilateral: Secondary | ICD-10-CM

## 2020-09-10 LAB — POCT GLYCOSYLATED HEMOGLOBIN (HGB A1C): HbA1c, POC (controlled diabetic range): 8.4 % — AB (ref 0.0–7.0)

## 2020-09-10 NOTE — Progress Notes (Signed)
HPI: Charles [name] is a 69 y.o. male, who is here today for AWV and 6 months follow up.   He was last seen on 06/10/20  He lives with his nephew and nephew's daughter. Independent ADL's and IADL's.  Functional Status Survey: Is the patient deaf or have difficulty hearing?: No Does the patient have difficulty seeing, even when wearing glasses/contacts?: No Does the patient have difficulty concentrating, remembering, or making decisions?: No Does the patient have difficulty walking or climbing stairs?: No Does the patient have difficulty dressing or bathing?: No Does the patient have difficulty doing errands alone such as visiting a doctor's office or shopping?: No  Fall Risk  09/10/2020  Falls in the past year? 0  Number falls in past yr: 0  Injury with Fall? 0  Follow up Education provided   Providers he sees regularly: Eye care provider: Last visit 07/2020. Cardiologist:Dr Hilty for SOB. He also sees Dr Claiborne Billings for OSA.  Depression screen [facility] 2/9 09/10/2020  Decreased Interest 0  Down, Depressed, Hopeless 0  PHQ - 2 Score 0     Mini-Cog - 09/10/20 1742    How many words correct? 3           Hearing Screening   '125Hz'  '250Hz'  '500Hz'  '1000Hz'  '2000Hz'  '3000Hz'  '4000Hz'  '6000Hz'  '8000Hz'   Right ear:           Left ear:           Vision Screening Comments: Has eye exam regularly. Refused.  Immunization History  Administered Date(s) Administered  . Fluad Quad(high Dose 65+) 09/11/2019, 09/03/2020  . Hepatitis A, Adult 01/18/2015  . Hepatitis B, adult 01/18/2015, 04/18/2015  . Influenza, High Dose Seasonal PF 10/07/2018  . Influenza,inj,Quad PF,6+ Mos 10/09/2014, 11/29/2015  . PFIZER SARS-COV-2 Vaccination 01/27/2020, 02/17/2020  . Pneumococcal Conjugate-13 06/06/2018  . Pneumococcal Polysaccharide-23 11/06/2016    CV risk factors:Age,obesity,DM II,HLD,HTN. He has not been consistent with following a healthful diet and no exercising regularly. Cannot walk long because  OA.  HLD: He is on Rosuvastatin 10 mg daily.  Lab Results  Component Value Date   CHOL 120 09/05/2019   HDL 37.70 (L) 09/05/2019   LDLCALC 65 09/05/2019   TRIG 87.0 09/05/2019   CHOLHDL 3 09/05/2019   DM II: He is not checking his BS's regularly. He is on Metformin 500 mg Am and 1000 mg Pm. He is not sure if Metformin helps with controlling his BS's. Also on Glimepiride 4 mg daily.  Hx of diarrhea but he does not think Metformin aggravates problem.  Lab Results  Component Value Date   HGBA1C 8.2 (A) 06/10/2020   Lab Results  Component Value Date   MICROALBUR 3.6 (H) 09/05/2019   MICROALBUR 1.4 06/06/2018   HTN: He is on Metoprolol succinate 25 mg daily,Amlodipine 10 mg daily,and Irbesartan 75 mg daily. Negative for severe/frequent headache, visual changes, chest pain, worsening dyspnea, palpitation, focal weakness, or unusual edema.  Lab Results  Component Value Date   CREATININE 1.10 05/17/2020   BUN 13 05/17/2020   NA 139 05/17/2020   K 3.9 06/10/2020   CL 102 05/17/2020   CO2 22 05/17/2020   Hearing loss, hx of cerumen impaction. Negative for earache or drainage. Hx of sensorineural hearing loss. He has seen audiologist in 02/2019  Review of Systems  Constitutional: Positive for fatigue. Negative for activity change, appetite change and fever.  HENT: Negative for mouth sores, nosebleeds and sore throat.   Respiratory: Negative for cough and  wheezing.   Gastrointestinal: Negative for abdominal pain, nausea and vomiting.       No changes in bowel habits.  Genitourinary: Negative for decreased urine volume, dysuria and hematuria.  Musculoskeletal: Positive for arthralgias. Negative for gait problem.  Skin: Negative for rash and wound.  Neurological: Negative for syncope and facial asymmetry.  Psychiatric/Behavioral: Negative for confusion. The patient is not nervous/anxious.   Rest of ROS, see pertinent positives sand negatives in HPI  Current Outpatient  Medications on File Prior to Visit  Medication Sig Dispense Refill  . ACCU-CHEK AVIVA PLUS test strip USE  AS  INSTRUCTED 100 strip 2  . ACCU-CHEK FASTCLIX LANCETS MISC Use to check blood sugars 2-3 times daily.    . Alcohol Swabs (B-D SINGLE USE SWABS REGULAR) PADS 1 Device by Does not apply route daily. 90 each 3  . allopurinol (ZYLOPRIM) 300 MG tablet TAKE 1 TABLET EVERY DAY 90 tablet 1  . amLODipine (NORVASC) 10 MG tablet TAKE 1 TABLET EVERY DAY 90 tablet 1  . aspirin 81 MG tablet Take 81 mg by mouth every evening.     . Blood Glucose Monitoring Suppl (ACCU-CHEK AVIVA PLUS) w/Device KIT Use to test blood sugar daily 1 kit 2  . Blood Glucose Monitoring Suppl (ACCU-CHEK NANO SMARTVIEW) w/Device KIT Use as directed.    Marland Kitchen glimepiride (AMARYL) 4 MG tablet TAKE 1 TABLET EVERY DAY BEFORE BREAKFAST 90 tablet 1  . irbesartan (AVAPRO) 75 MG tablet TAKE 1 TABLET EVERY DAY 90 tablet 1  . metFORMIN (GLUCOPHAGE) 500 MG tablet TAKE 1 TABLET IN THE MORNING AND TAKE 2 TABLETS WITH SUPPER 270 tablet 2  . rosuvastatin (CRESTOR) 10 MG tablet TAKE 1 TABLET EVERY DAY 90 tablet 3  . sucralfate (CARAFATE) 1 g tablet Take 1 tablet (1 g total) by mouth 4 (four) times daily -  with meals and at bedtime. 21 tablet 0  . famotidine (PEPCID) 20 MG tablet Take 1 tablet (20 mg total) by mouth 2 (two) times daily for 10 days. 30 tablet 0   No current facility-administered medications on file prior to visit.     Past Medical History:  Diagnosis Date  . Abnormal carotid ultrasound 07/2015   mild bilateral stenosis, repeat in 1 year.  Dr. Debara Pickett  . Diabetes mellitus without complication ([place])   . Gout   . Hepatitis C antibody test positive 05/2015   prior infection, resolved  . History of cardiovascular stress test 9/16   Lexiscan, EF 55%, no ischemia, Dr. Debara Pickett  . Hyperlipidemia   . Hypertension   . Noncompliance   . Obesity   . PVD (peripheral vascular disease) ([place]) 07/2015   normal ABIs, Dr. Debara Pickett   Allergies    Allergen Reactions  . Penicillins Other (See Comments)    Per pt blacked out after injection as a child.  Did it involve swelling of the face/tongue/throat, SOB, or low BP? N Did it involve sudden or severe rash/hives, skin peeling, or any reaction on the inside of your mouth or nose? N Did you need to seek medical attention at a hospital or doctor's office? N When did it last happen?Several Decades Ago If all above answers are "NO", may proceed with cephalosporin use.     Social History   Socioeconomic History  . Marital status: Divorced    Spouse name: Not on file  . Number of children: Not on file  . Years of education: Not on file  . Highest education level: Not on file  Occupational History  . Not on file  Tobacco Use  . Smoking status: Former Smoker    Packs/day: 1.00    Years: 30.00    Pack years: 30.00    Types: Cigarettes    Quit date: 07/31/1995    Years since quitting: 25.1  . Smokeless tobacco: Never Used  Vaping Use  . Vaping Use: Never used  Substance and Sexual Activity  . Alcohol use: No    Alcohol/week: 0.0 standard drinks  . Drug use: No  . Sexual activity: Not on file  Other Topics Concern  . Not on file  Social History Narrative  . Not on file   Social Determinants of Health   Financial Resource Strain:   . Difficulty of Paying Living Expenses: Not on file  Food Insecurity:   . Worried About Charity fundraiser in the Last Year: Not on file  . Ran Out of Food in the Last Year: Not on file  Transportation Needs:   . Lack of Transportation (Medical): Not on file  . Lack of Transportation (Non-Medical): Not on file  Physical Activity:   . Days of Exercise per Week: Not on file  . Minutes of Exercise per Session: Not on file  Stress:   . Feeling of Stress : Not on file  Social Connections:   . Frequency of Communication with Friends and Family: Not on file  . Frequency of Social Gatherings with Friends and Family: Not on file  .  Attends Religious Services: Not on file  . Active Member of Clubs or Organizations: Not on file  . Attends Archivist Meetings: Not on file  . Marital Status: Not on file    Vitals:   09/10/20 1054  BP: 126/82  Pulse: 95  Resp: 16  Temp: 98.4 F (36.9 C)  SpO2: 98%   Wt Readings from Last 3 Encounters:  09/10/20 270 lb (122.5 kg)  06/10/20 269 lb 2 oz (122.1 kg)  05/17/20 257 lb (116.6 kg)   Body mass index is 44.93 kg/m.  Physical Exam Vitals and nursing note reviewed.  Constitutional:      General: He is not in acute distress.    Appearance: He is well-developed.  HENT:     Head: Normocephalic and atraumatic.     Right Ear: External ear normal.     Left Ear: External ear normal.     Ears:     Comments: Cerumen impaction,bilateral.    Mouth/Throat:     Mouth: Mucous membranes are moist.     Pharynx: Oropharynx is clear.  Eyes:     Conjunctiva/sclera: Conjunctivae normal.  Cardiovascular:     Rate and Rhythm: Normal rate and regular rhythm.     Heart sounds: No murmur heard.      Comments: DP pulses present. Trace pitting LE edema,bilateral. Pulmonary:     Effort: Pulmonary effort is normal. No respiratory distress.     Breath sounds: Normal breath sounds.  Abdominal:     Palpations: Abdomen is soft. There is no mass.     Tenderness: There is no abdominal tenderness.  Lymphadenopathy:     Cervical: No cervical adenopathy.  Skin:    General: Skin is warm.     Findings: No erythema or rash.  Neurological:     General: No focal deficit present.     Mental Status: He is alert and oriented to person, place, and time.     Cranial Nerves: No cranial nerve deficit.  Gait: Gait normal.  Psychiatric:        Mood and Affect: Mood and affect normal.     Comments: Well groomed, good eye contact.   ASSESSMENT AND PLAN:  Charles [name] was seen today for AWV and chronic disease management.  Orders Placed This Encounter  Procedures  . Lipid  panel  . Microalbumin / creatinine urine ratio  . COMPLETE METABOLIC PANEL WITH GFR  . Ambulatory referral to ENT  . POC HgB A1c    Lab Results  Component Value Date   HGBA1C 8.4 (A) 09/10/2020    Medicare annual wellness visit, subsequent We discussed the importance of staying active, physically and mentally, as well as the benefits of a healthy/balance diet. Low impact exercise that involve stretching and strengthing are ideal. Vaccines:Next pneumovax in 10/2021. I do not see Tdap. It can be given at his pharmacy if covered by his insurance. We discussed preventive screening for the next 5-10 years, summery of recommendations given in AVS. Fall prevention. Colonoscopy in 05/2017, 5 years f/u recommended.  Advance directives and end of life discussed, he does not have POA and living will but planning on doing so.Advantage directives package given today.   Type 2 diabetes mellitus with vascular disease ([place]) Problem is not well controlled. We have had limitations in regard to treatment options due to insurance coverage. He will benefit from [place] but his insurance does not cover it and did not cover Victoza. No changes in Amaryl for now. Metformin dose increased to 1000 mg bid.  Regular physical activity as tolerated and healthy diet with avoidance of added sugar food intake is an important part of treatment and recommended. Some dietary recommendations given on AVS. Annual eye exam and foot care recommended. F/U in 4 months  Bilateral hearing loss due to cerumen impaction Hx of sensorineural hearing loss and aggravated by cerumen impaction. He has had ear lavage in the past and left with fullness ear sensation for days after procedure. ENT referral placed.  Essential hypertension BP adequately controlled. No changes in current management. Continue low salt diet. Eye exam is current.  Hyperlipidemia, unspecified hyperlipidemia type Continue Rosuvastatin 10 mg  daily. Low fat diet is also recommended. He is not fasting today,so lab appt will be arranged.  Morbid obesity ([place]) Wt has been stable sine his last visit. His cardiologist has referred [facility] to [facility] and wellness program. He doe snot think he needs to see a nutritionist, he know what and what not to eat.   Return in about 4 months (around 01/10/2021) for DM III,HTN. Fasting labs in the next few days..   Hosey Burmester G. [place], MD  [facility]. [place] office.   Charles [name] , Thank you for taking time to come for your Medicare Wellness Visit. I appreciate your ongoing commitment to your health goals. Please review the following plan we discussed and let me know if I can assist you in the future.   These are the goals we discussed: Goals    . DIET - REDUCE SUGAR INTAKE     You sugar will get much better if you lose wt.       This is a list of the screening recommended for you and due dates:  Health Maintenance  Topic Date Due  . Cologuard (Stool DNA test)  05/28/2020  . Complete foot exam   11/20/2020  . Hemoglobin A1C  03/10/2021  . Eye exam for diabetics  08/10/2021  . Pneumonia vaccines (  2 of 2 - PPSV23) 11/06/2021  . Flu Shot  Completed  . COVID-19 Vaccine  Completed  .  Hepatitis C: One time screening is recommended by Center for Disease Control  (CDC) for  adults born from [birth year] through 1964.   Completed  . Tetanus Vaccine  Discontinued   A few things to remember from today's visit:   Type 2 diabetes mellitus with vascular disease ([place]) - Plan: POC HgB A1c  Medicare annual wellness visit, subsequent  Bilateral hearing loss, unspecified hearing loss type  If you need refills please call your pharmacy. Do not use My Chart to request refills or for acute issues that need immediate attention.    Please be sure medication list is accurate. If a new problem present, please set up appointment sooner than planned today.   A few tips:  -As we age balance  is not as good as it was, so there is a higher risks for falls. Please remove small rugs and furniture that is "in your way" and could increase the risk of falls. Stretching exercises may help with fall prevention: Yoga and Tai Chi are some examples. Low impact exercise is better, so you are not very achy the next day.  -Sun screen and avoidance of direct sun light recommended. Caution with dehydration, if working outdoors be sure to drink enough fluids.  - Some medications are not safe as we age, increases the risk of side effects and can potentially interact with other medication you are also taken;  including some of over the counter medications. Be sure to let me know when you start a new medication even if it is a dietary/vitamin supplement.   -Healthy diet low in red meet/animal fat and sugar + regular physical activity is recommended.     Diabetes Mellitus and Nutrition, Adult When you have diabetes (diabetes mellitus), it is very important to have healthy eating habits because your blood sugar (glucose) levels are greatly affected by what you eat and drink. Eating healthy foods in the appropriate amounts, at about the same times every day, can help you:  Control your blood glucose.  Lower your risk of heart disease.  Improve your blood pressure.  Reach or maintain a healthy weight. Every person with diabetes is different, and each person has different needs for a meal plan. Your health care provider may recommend that you work with a diet and nutrition specialist (dietitian) to make a meal plan that is best for you. Your meal plan may vary depending on factors such as:  The calories you need.  The medicines you take.  Your weight.  Your blood glucose, blood pressure, and cholesterol levels.  Your activity level.  Other health conditions you have, such as heart or kidney disease. How do carbohydrates affect me? Carbohydrates, also called carbs, affect your blood glucose  level more than any other type of food. Eating carbs naturally raises the amount of glucose in your blood. Carb counting is a method for keeping track of how many carbs you eat. Counting carbs is important to keep your blood glucose at a healthy level, especially if you use insulin or take certain oral diabetes medicines. It is important to know how many carbs you can safely have in each meal. This is different for every person. Your dietitian can help you calculate how many carbs you should have at each meal and for each snack. Foods that contain carbs include:  Bread, cereal, rice, pasta, and crackers.  Potatoes and  corn.  Peas, beans, and lentils.  Milk and yogurt.  Fruit and juice.  Desserts, such as cakes, cookies, ice cream, and candy. How does alcohol affect me? Alcohol can cause a sudden decrease in blood glucose (hypoglycemia), especially if you use insulin or take certain oral diabetes medicines. Hypoglycemia can be a life-threatening condition. Symptoms of hypoglycemia (sleepiness, dizziness, and confusion) are similar to symptoms of having too much alcohol. If your health care provider says that alcohol is safe for you, follow these guidelines:  Limit alcohol intake to no more than 1 drink per day for nonpregnant women and 2 drinks per day for men. One drink equals 12 oz of beer, 5 oz of wine, or 1 oz of hard liquor.  Do not drink on an empty stomach.  Keep yourself hydrated with water, diet soda, or unsweetened iced tea.  Keep in mind that regular soda, juice, and other mixers may contain a lot of sugar and must be counted as carbs. What are tips for following this plan?  Reading food labels  Start by checking the serving size on the "Nutrition Facts" label of packaged foods and drinks. The amount of calories, carbs, fats, and other nutrients listed on the label is based on one serving of the item. Many items contain more than one serving per package.  Check the total  grams (g) of carbs in one serving. You can calculate the number of servings of carbs in one serving by dividing the total carbs by 15. For example, if a food has 30 g of total carbs, it would be equal to 2 servings of carbs.  Check the number of grams (g) of saturated and trans fats in one serving. Choose foods that have low or no amount of these fats.  Check the number of milligrams (mg) of salt (sodium) in one serving. Most people should limit total sodium intake to less than 2,300 mg per day.  Always check the nutrition information of foods labeled as "low-fat" or "nonfat". These foods may be higher in added sugar or refined carbs and should be avoided.  Talk to your dietitian to identify your daily goals for nutrients listed on the label. Shopping  Avoid buying canned, premade, or processed foods. These foods tend to be high in fat, sodium, and added sugar.  Shop around the outside edge of the grocery store. This includes fresh fruits and vegetables, bulk grains, fresh meats, and fresh dairy. Cooking  Use low-heat cooking methods, such as baking, instead of high-heat cooking methods like deep frying.  Cook using healthy oils, such as olive, canola, or sunflower oil.  Avoid cooking with butter, cream, or high-fat meats. Meal planning  Eat meals and snacks regularly, preferably at the same times every day. Avoid going long periods of time without eating.  Eat foods high in fiber, such as fresh fruits, vegetables, beans, and whole grains. Talk to your dietitian about how many servings of carbs you can eat at each meal.  Eat 4-6 ounces (oz) of lean protein each day, such as lean meat, chicken, fish, eggs, or tofu. One oz of lean protein is equal to: ? 1 oz of meat, chicken, or fish. ? 1 egg. ?  cup of tofu.  Eat some foods each day that contain healthy fats, such as avocado, nuts, seeds, and fish. Lifestyle  Check your blood glucose regularly.  Exercise regularly as told by your  health care provider. This may include: ? 150 minutes of moderate-intensity or vigorous-intensity exercise  each week. This could be brisk walking, biking, or water aerobics. ? Stretching and doing strength exercises, such as yoga or weightlifting, at least 2 times a week.  Take medicines as told by your health care provider.  Do not use any products that contain nicotine or tobacco, such as cigarettes and e-cigarettes. If you need help quitting, ask your health care provider.  Work with a Social worker or diabetes educator to identify strategies to manage stress and any emotional and social challenges. Questions to ask a health care provider  Do I need to meet with a diabetes educator?  Do I need to meet with a dietitian?  What number can I call if I have questions?  When are the best times to check my blood glucose? Where to find more information:  American Diabetes Association: diabetes.org  Academy of Nutrition and Dietetics: www.eatright.[employer] of Diabetes and Digestive and Kidney Diseases (NIH): [employer] Summary  A healthy meal plan will help you control your blood glucose and maintain a healthy lifestyle.  Working with a diet and nutrition specialist (dietitian) can help you make a meal plan that is best for you.  Keep in mind that carbohydrates (carbs) and alcohol have immediate effects on your blood glucose levels. It is important to count carbs and to use alcohol carefully. This information is not intended to replace advice given to you by your health care provider. Make sure you discuss any questions you have with your health care provider. Document Revised: 11/19/2017 Document Reviewed: 01/11/2017 Elsevier Patient Education  2020 [employer].

## 2020-09-10 NOTE — Patient Instructions (Addendum)
Charles [name] , Thank you for taking time to come for your Medicare Wellness Visit. I appreciate your ongoing commitment to your health goals. Please review the following plan we discussed and let me know if I can assist you in the future.   These are the goals we discussed: Goals    . DIET - REDUCE SUGAR INTAKE     You sugar will get much better if you lose wt.       This is a list of the screening recommended for you and due dates:  Health Maintenance  Topic Date Due  . Cologuard (Stool DNA test)  05/28/2020  . Complete foot exam   11/20/2020  . Hemoglobin A1C  03/10/2021  . Eye exam for diabetics  08/10/2021  . Pneumonia vaccines (2 of 2 - PPSV23) 11/06/2021  . Flu Shot  Completed  . COVID-19 Vaccine  Completed  .  Hepatitis C: One time screening is recommended by Center for Disease Control  (CDC) for  adults born from 1945 through 1965.   Completed  . Tetanus Vaccine  Discontinued   A few things to remember from today's visit:   Type 2 diabetes mellitus with vascular disease (HCC) - Plan: POC HgB A1c  Medicare annual wellness visit, subsequent  Bilateral hearing loss, unspecified hearing loss type  If you need refills please call your pharmacy. Do not use My Chart to request refills or for acute issues that need immediate attention.    Please be sure medication list is accurate. If a new problem present, please set up appointment sooner than planned today.   A few tips:  -As we age balance is not as good as it was, so there is a higher risks for falls. Please remove small rugs and furniture that is "in your way" and could increase the risk of falls. Stretching exercises may help with fall prevention: Yoga and Tai Chi are some examples. Low impact exercise is better, so you are not very achy the next day.  -Sun screen and avoidance of direct sun light recommended. Caution with dehydration, if working outdoors be sure to drink enough fluids.  - Some medications are  not safe as we age, increases the risk of side effects and can potentially interact with other medication you are also taken;  including some of over the counter medications. Be sure to let me know when you start a new medication even if it is a dietary/vitamin supplement.   -Healthy diet low in red meet/animal fat and sugar + regular physical activity is recommended.     Diabetes Mellitus and Nutrition, Adult When you have diabetes (diabetes mellitus), it is very important to have healthy eating habits because your blood sugar (glucose) levels are greatly affected by what you eat and drink. Eating healthy foods in the appropriate amounts, at about the same times every day, can help you:  Control your blood glucose.  Lower your risk of heart disease.  Improve your blood pressure.  Reach or maintain a healthy weight. Every person with diabetes is different, and each person has different needs for a meal plan. Your health care provider may recommend that you work with a diet and nutrition specialist (dietitian) to make a meal plan that is best for you. Your meal plan may vary depending on factors such as:  The calories you need.  The medicines you take.  Your weight.  Your blood glucose, blood pressure, and cholesterol levels.  Your activity level.  Other health   conditions you have, such as heart or kidney disease. How do carbohydrates affect me? Carbohydrates, also called carbs, affect your blood glucose level more than any other type of food. Eating carbs naturally raises the amount of glucose in your blood. Carb counting is a method for keeping track of how many carbs you eat. Counting carbs is important to keep your blood glucose at a healthy level, especially if you use insulin or take certain oral diabetes medicines. It is important to know how many carbs you can safely have in each meal. This is different for every person. Your dietitian can help you calculate how many carbs you  should have at each meal and for each snack. Foods that contain carbs include:  Bread, cereal, rice, pasta, and crackers.  Potatoes and corn.  Peas, beans, and lentils.  Milk and yogurt.  Fruit and juice.  Desserts, such as cakes, cookies, ice cream, and candy. How does alcohol affect me? Alcohol can cause a sudden decrease in blood glucose (hypoglycemia), especially if you use insulin or take certain oral diabetes medicines. Hypoglycemia can be a life-threatening condition. Symptoms of hypoglycemia (sleepiness, dizziness, and confusion) are similar to symptoms of having too much alcohol. If your health care provider says that alcohol is safe for you, follow these guidelines:  Limit alcohol intake to no more than 1 drink per day for nonpregnant women and 2 drinks per day for men. One drink equals 12 oz of beer, 5 oz of wine, or 1 oz of hard liquor.  Do not drink on an empty stomach.  Keep yourself hydrated with water, diet soda, or unsweetened iced tea.  Keep in mind that regular soda, juice, and other mixers may contain a lot of sugar and must be counted as carbs. What are tips for following this plan?  Reading food labels  Start by checking the serving size on the "Nutrition Facts" label of packaged foods and drinks. The amount of calories, carbs, fats, and other nutrients listed on the label is based on one serving of the item. Many items contain more than one serving per package.  Check the total grams (g) of carbs in one serving. You can calculate the number of servings of carbs in one serving by dividing the total carbs by 15. For example, if a food has 30 g of total carbs, it would be equal to 2 servings of carbs.  Check the number of grams (g) of saturated and trans fats in one serving. Choose foods that have low or no amount of these fats.  Check the number of milligrams (mg) of salt (sodium) in one serving. Most people should limit total sodium intake to less than 2,300  mg per day.  Always check the nutrition information of foods labeled as "low-fat" or "nonfat". These foods may be higher in added sugar or refined carbs and should be avoided.  Talk to your dietitian to identify your daily goals for nutrients listed on the label. Shopping  Avoid buying canned, premade, or processed foods. These foods tend to be high in fat, sodium, and added sugar.  Shop around the outside edge of the grocery store. This includes fresh fruits and vegetables, bulk grains, fresh meats, and fresh dairy. Cooking  Use low-heat cooking methods, such as baking, instead of high-heat cooking methods like deep frying.  Cook using healthy oils, such as olive, canola, or sunflower oil.  Avoid cooking with butter, cream, or high-fat meats. Meal planning  Eat meals and snacks regularly,  preferably at the same times every day. Avoid going long periods of time without eating.  Eat foods high in fiber, such as fresh fruits, vegetables, beans, and whole grains. Talk to your dietitian about how many servings of carbs you can eat at each meal.  Eat 4-6 ounces (oz) of lean protein each day, such as lean meat, chicken, fish, eggs, or tofu. One oz of lean protein is equal to: ? 1 oz of meat, chicken, or fish. ? 1 egg. ?  cup of tofu.  Eat some foods each day that contain healthy fats, such as avocado, nuts, seeds, and fish. Lifestyle  Check your blood glucose regularly.  Exercise regularly as told by your health care provider. This may include: ? 150 minutes of moderate-intensity or vigorous-intensity exercise each week. This could be brisk walking, biking, or water aerobics. ? Stretching and doing strength exercises, such as yoga or weightlifting, at least 2 times a week.  Take medicines as told by your health care provider.  Do not use any products that contain nicotine or tobacco, such as cigarettes and e-cigarettes. If you need help quitting, ask your health care  provider.  Work with a Social worker or diabetes educator to identify strategies to manage stress and any emotional and social challenges. Questions to ask a health care provider  Do I need to meet with a diabetes educator?  Do I need to meet with a dietitian?  What number can I call if I have questions?  When are the best times to check my blood glucose? Where to find more information:  American Diabetes Association: diabetes.org  Academy of Nutrition and Dietetics: www.eatright.[employer] of Diabetes and Digestive and Kidney Diseases (NIH): [employer] Summary  A healthy meal plan will help you control your blood glucose and maintain a healthy lifestyle.  Working with a diet and nutrition specialist (dietitian) can help you make a meal plan that is best for you.  Keep in mind that carbohydrates (carbs) and alcohol have immediate effects on your blood glucose levels. It is important to count carbs and to use alcohol carefully. This information is not intended to replace advice given to you by your health care provider. Make sure you discuss any questions you have with your health care provider. Document Revised: 11/19/2017 Document Reviewed: 01/11/2017 Elsevier Patient Education  2020 [employer].

## 2020-09-11 ENCOUNTER — Other Ambulatory Visit: Payer: Self-pay | Admitting: Family Medicine

## 2020-09-13 MED ORDER — METFORMIN HCL 500 MG PO TABS: 1000.0000 mg | ORAL_TABLET | Freq: Two times a day (BID) | ORAL | 2 refills | Status: DC

## 2020-09-18 ENCOUNTER — Telehealth: Payer: Self-pay | Admitting: Family Medicine

## 2020-09-18 ENCOUNTER — Other Ambulatory Visit: Payer: Self-pay

## 2020-09-18 ENCOUNTER — Other Ambulatory Visit (INDEPENDENT_AMBULATORY_CARE_PROVIDER_SITE_OTHER): Payer: Medicare PPO

## 2020-09-18 DIAGNOSIS — I10 Essential (primary) hypertension: Secondary | ICD-10-CM

## 2020-09-18 DIAGNOSIS — E1159 Type 2 diabetes mellitus with other circulatory complications: Secondary | ICD-10-CM

## 2020-09-18 DIAGNOSIS — E785 Hyperlipidemia, unspecified: Secondary | ICD-10-CM

## 2020-09-18 NOTE — Telephone Encounter (Signed)
Pt called in stating that he received a call stating that we did not receive the eye report.  [facility] [place]) [address] [place].  [phone] Ext: [phone]  Dr. Mack Hook  (Diabetic Eye Exam:  July 11, 2020).  Pt stated that he would like for [place] to reach out to the facility to see if we can get the eye report.

## 2020-09-19 LAB — LIPID PANEL
Cholesterol: 131 mg/dL (ref <200)
HDL: 42 mg/dL (ref >=40)
LDL Cholesterol (Calc): 73 mg/dL (calc)
Non-HDL Cholesterol (Calc): 89 mg/dL (calc) (ref <130)
Total CHOL/HDL Ratio: 3.1 (calc) (ref <5.0)
Triglycerides: 79 mg/dL (ref <150)

## 2020-09-19 LAB — COMPLETE METABOLIC PANEL WITH GFR
AG Ratio: 1.2 (calc) (ref 1.0–2.5)
ALT: 18 U/L (ref 9–46)
AST: 17 U/L (ref 10–35)
Albumin: 4.1 g/dL (ref 3.6–5.1)
Alkaline phosphatase (APISO): 81 U/L (ref 35–144)
BUN: 12 mg/dL (ref 7–25)
CO2: 24 mmol/L (ref 20–32)
Calcium: 9.3 mg/dL (ref 8.6–10.3)
Chloride: 104 mmol/L (ref 98–110)
Creat: 1.19 mg/dL (ref 0.70–1.25)
GFR, Est African American: 72 mL/min/{1.73_m2} (ref >=60)
GFR, Est Non African American: 62 mL/min/{1.73_m2} (ref >=60)
Globulin: 3.3 g/dL (calc) (ref 1.9–3.7)
Glucose, Bld: 138 mg/dL — ABNORMAL HIGH (ref 65–99)
Potassium: 3.7 mmol/L (ref 3.5–5.3)
Sodium: 139 mmol/L (ref 135–146)
Total Bilirubin: 0.3 mg/dL (ref 0.2–1.2)
Total Protein: 7.4 g/dL (ref 6.1–8.1)

## 2020-09-19 LAB — MICROALBUMIN / CREATININE URINE RATIO
Creatinine, Urine: 283 mg/dL (ref 20–320)
Microalb Creat Ratio: 13 mcg/mg creat (ref <30)
Microalb, Ur: 3.6 mg/dL

## 2020-09-20 NOTE — Telephone Encounter (Signed)
I left a message for Charles [name] to send me the copy of his diabetic eye exam from 7/22.

## 2020-09-23 ENCOUNTER — Other Ambulatory Visit: Payer: Self-pay | Admitting: Family Medicine

## 2020-09-23 MED ORDER — ACCU-CHEK AVIVA PLUS VI STRP: ORAL_STRIP | 2 refills | Status: DC

## 2020-09-23 NOTE — Addendum Note (Signed)
Addended by: Kathreen Devoid on: 09/23/2020 02:39 PM   Modules accepted: Orders

## 2020-09-24 ENCOUNTER — Telehealth (INDEPENDENT_AMBULATORY_CARE_PROVIDER_SITE_OTHER): Payer: Medicare PPO | Admitting: Physician Assistant

## 2020-09-24 ENCOUNTER — Telehealth: Payer: Self-pay

## 2020-09-24 VITALS — Wt 270.0 lb

## 2020-09-24 DIAGNOSIS — I10 Essential (primary) hypertension: Secondary | ICD-10-CM

## 2020-09-24 DIAGNOSIS — I739 Peripheral vascular disease, unspecified: Secondary | ICD-10-CM | POA: Diagnosis not present

## 2020-09-24 DIAGNOSIS — E785 Hyperlipidemia, unspecified: Secondary | ICD-10-CM

## 2020-09-24 DIAGNOSIS — G4733 Obstructive sleep apnea (adult) (pediatric): Secondary | ICD-10-CM

## 2020-09-24 DIAGNOSIS — E119 Type 2 diabetes mellitus without complications: Secondary | ICD-10-CM | POA: Diagnosis not present

## 2020-09-24 NOTE — Progress Notes (Signed)
Virtual Visit via Telephone Note   This visit type was conducted due to national recommendations for restrictions regarding the COVID-19 Pandemic (e.g. social distancing) in an effort to limit this patient's exposure and mitigate transmission in our community.  Due to his co-morbid illnesses, this patient is at least at moderate risk for complications without adequate follow up.  This format is felt to be most appropriate for this patient at this time.  The patient did not have access to video technology/had technical difficulties with video requiring transitioning to audio format only (telephone).  All issues noted in this document were discussed and addressed.  No physical exam could be performed with this format.  Please refer to the patient's chart for his  consent to telehealth for [facility].    Date:  09/26/2020   ID:  Charles [name], DOB [birth year], MRN [id number] The patient was identified using 2 identifiers.  Patient Location: Home Provider Location: Office/Clinic  PCP:  [place], [name], MD  Cardiologist:  Pixie Casino, MD  Electrophysiologist:  None   Evaluation Performed:  Follow-Up Visit  Chief Complaint:  Follow up  History of Present Illness:    Charles [name] is a 69 y.o. male with past medical history of OSA, hepatitis C, PAD, HTN, HLD and DM2.  According to the previous record, he underwent lower extremity angiography in 1996 and underwent balloon angioplasty.  Carotid Doppler obtained on 06/05/2016 showed a mild stenosis in the left internal carotid artery, no significant plaque on the right side.  Last Myoview obtained on 05/26/2018 showed EF 56%, small defect of mild severity present in the apex location which is reversible at low could represent a small area of ischemia versus diaphragmatic attenuation artifact, overall low risk study.  Given the fact that he has been asymptomatic, Dr. Debara Pickett did not pursue any invasive work-up.  His sleep study was concerning  for obstructive sleep apnea, and he was started on CPAP therapy.  He was last seen by Dr. Debara Pickett on 03/22/2020 at which time he continued to have fatigue during the day and somnolence.  Dr. Debara Pickett recommended he contact his medical supply company and get fitted with CPAP therapy.  He struggled with inability to lose weight.  Since the last visit, he was seen in the ED on 05/09/2020 for atypical chest pain.  CT did not show any evidence of aortic dissection or aneurysm.  Troponin was normal.   Patient presents today for follow-up.  He has baseline dyspnea on exertion, this is unchanged.  He attributed to dyspnea on exertion to obesity, asthma and lack of exercise.  He denies any recent chest pain, lower extremity edema, orthopnea or PND.  He says he never picked up the CPAP equipment because he was not completely confident about the validity of the first sleep study.  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  During the previous sleep study, he says he never completely fell asleep therefore he does not believe result of the study very much.  He is willing to try another sleep study, however he wished to obtain the sleep study during the day when he is more easily for him to fall asleep.  If he is able to get a more confirmative study, he is willing to try CPAP therapy.  The patient does not have symptoms concerning for COVID-19 infection (fever, chills, cough, or new shortness of breath).    Past  Medical History:  Diagnosis Date  . Abnormal carotid ultrasound 07/2015   mild bilateral stenosis, repeat in 1 year.  Dr. Debara Pickett  . Diabetes mellitus without complication ([place])   . Gout   . Hepatitis C antibody test positive 05/2015   prior infection, resolved  . History of cardiovascular stress test 9/16   Lexiscan, EF 55%, no ischemia, Dr. Debara Pickett  . Hyperlipidemia   . Hypertension   . Noncompliance   . Obesity   . PVD (peripheral vascular disease) ([place]) 07/2015    normal ABIs, Dr. Debara Pickett   Past Surgical History:  Procedure Laterality Date  . BALLOON ANGIOPLASTY, ARTERY N/A also placed stent  . COLONOSCOPY  2016   Dr. Benson [place]     Current Meds  Medication Sig  . ACCU-CHEK FASTCLIX LANCETS MISC Use to check blood sugars 2-3 times daily.  . Alcohol Swabs (B-D SINGLE USE SWABS REGULAR) PADS 1 Device by Does not apply route daily.  Marland Kitchen allopurinol (ZYLOPRIM) 300 MG tablet TAKE 1 TABLET EVERY DAY  . amLODipine (NORVASC) 10 MG tablet TAKE 1 TABLET EVERY DAY  . aspirin 81 MG tablet Take 81 mg by mouth every evening.   . Blood Glucose Monitoring Suppl (ACCU-CHEK AVIVA PLUS) w/Device KIT Use to test blood sugar daily  . Blood Glucose Monitoring Suppl (ACCU-CHEK NANO SMARTVIEW) w/Device KIT Use as directed.  Marland Kitchen glimepiride (AMARYL) 4 MG tablet TAKE 1 TABLET EVERY DAY BEFORE BREAKFAST  . glucose blood (ACCU-CHEK AVIVA PLUS) test strip USE AS INSTRUCTED  . irbesartan (AVAPRO) 75 MG tablet TAKE 1 TABLET EVERY DAY  . metFORMIN (GLUCOPHAGE) 500 MG tablet Take 1,000 mg by mouth 2 (two) times daily with a meal.  . metoprolol succinate (TOPROL-XL) 25 MG 24 hr tablet TAKE 1 TABLET EVERY DAY  . rosuvastatin (CRESTOR) 10 MG tablet TAKE 1 TABLET EVERY DAY  . [DISCONTINUED] metFORMIN (GLUCOPHAGE) 500 MG tablet Take 2 tablets (1,000 mg total) by mouth 2 (two) times daily with a meal. TAKE 1 TABLET IN THE MORNING AND TAKE 2 TABLETS WITH SUPPER (Patient taking differently: Take 1,000 mg by mouth 2 (two) times daily with a meal. )     Allergies:   Penicillins   Social History   Tobacco Use  . Smoking status: Former Smoker    Packs/day: 1.00    Years: 30.00    Pack years: 30.00    Types: Cigarettes    Quit date: 07/31/1995    Years since quitting: 25.1  . Smokeless tobacco: Never Used  Vaping Use  . Vaping Use: Never used  Substance Use Topics  . Alcohol use: No    Alcohol/week: 0.0 standard drinks  . Drug use: No     Family Hx: The patient's family history  includes Asthma in his mother; Diabetes in his father; HIV in his sister; Heart attack in his mother; Hyperlipidemia in his mother; Hypertension in his brother, father, and mother; Liver disease in his brother; Lung cancer in his brother.  ROS:   Please see the history of present illness.     All other systems reviewed and are negative.   Prior CV studies:   The following studies were reviewed today:  Myoview 05/26/2018  The left ventricular ejection fraction is normal (55-65%).  Nuclear stress EF: 56%.  There is a small defect of mild severity present in the apex location. The defect is reversible and could represent a very small area of ischemia vs. Diaphragmatic and soft tissue attenuation artifact.  This is a  low risk study.  There was no ST segment deviation noted during stress.   Labs/Other Tests and Data Reviewed:    EKG:  An ECG dated 05/17/2020 was personally reviewed today and demonstrated:  Normal sinus rhythm without significant ST-T wave changes.  Recent Labs: 11/21/2019: TSH 3.93 05/17/2020: Hemoglobin 15.1; Platelets 295 09/18/2020: ALT 18; BUN 12; Creat 1.19; Potassium 3.7; Sodium 139   Recent Lipid Panel Lab Results  Component Value Date/Time   CHOL 131 09/18/2020 08:11 AM   TRIG 79 09/18/2020 08:11 AM   HDL 42 09/18/2020 08:11 AM   CHOLHDL 3.1 09/18/2020 08:11 AM   LDLCALC 73 09/18/2020 08:11 AM    Wt Readings from Last 3 Encounters:  09/24/20 270 lb (122.5 kg)  09/10/20 270 lb (122.5 kg)  06/10/20 269 lb 2 oz (122.1 kg)     Objective:    Vital Signs:  Wt 270 lb (122.5 kg) Comment: 2 weeks ago  BMI 44.93 kg/m    VITAL SIGNS:  reviewed  ASSESSMENT & PLAN:    1. PAD: Denies any claudication symptoms.  2. Hypertension: Continue on current therapy  3. Hyperlipidemia: On Crestor  4. DM2: Managed by primary care provider.  5. Obstructive sleep apnea: Patient never started on the CPAP therapy, as he was very doubtful of the validity of the  initial sleep study for the obstructive sleep apnea.  He says he was unable to fall asleep at the time due to the fact that he used work third shift and usually sleeps during the day.  He is willing to consider CPAP only if he can go through with another sleep study during the day.  I will reach out to [facility] our sleep study coordinator.  COVID-19 Education: The signs and symptoms of COVID-19 were discussed with the patient and how to seek care for testing (follow up with PCP or arrange E-visit).  The importance of social distancing was discussed today.  Time:   Today, I have spent 14 minutes with the patient with telehealth technology discussing the above problems.     Medication Adjustments/Labs and Tests Ordered: Current medicines are reviewed at length with the patient today.  Concerns regarding medicines are outlined above.   Tests Ordered: No orders of the defined types were placed in this encounter.   Medication Changes: No orders of the defined types were placed in this encounter.   Follow Up:  In Person in 6 month(s)  Signed, Almyra Deforest, [place]  09/26/2020 9:10 PM    [facility]

## 2020-09-24 NOTE — Patient Instructions (Signed)
Medication Instructions:  No changes *If you need a refill on your cardiac medications before your next appointment, please call your pharmacy*   Lab Work: None ordered If you have labs (blood work) drawn today and your tests are completely normal, you will receive your results only by:  MyChart Message (if you have MyChart) OR  A paper copy in the mail If you have any lab test that is abnormal or we need to change your treatment, we will call you to review the results.   Testing/Procedures: None ordered   Follow-Up: At [facility], you and your health needs are our priority.  As part of our continuing mission to provide you with exceptional heart care, we have created designated Provider Care Teams.  These Care Teams include your primary Cardiologist (physician) and Advanced Practice Providers (APPs -  Physician Assistants and Nurse Practitioners) who all work together to provide you with the care you need, when you need it.  We recommend signing up for the patient portal called "MyChart".  Sign up information is provided on this After Visit Summary.  MyChart is used to connect with patients for Virtual Visits (Telemedicine).  Patients are able to view lab/test results, encounter notes, upcoming appointments, etc.  Non-urgent messages can be sent to your provider as well.   To learn more about what you can do with MyChart, go to [email or web address].    Your next appointment:   6 month(s)  The format for your next appointment:   In Person  Provider:   K. Italy Hilty, MD   Other Instructions A referral has been sent for you to our sleep coordinator. They will reach out to you.

## 2020-09-24 NOTE — Telephone Encounter (Signed)
°  Patient Consent for Virtual Visit         LEMAN MARTINEK has provided verbal consent on 09/24/2020 for a virtual visit (video or telephone).   CONSENT FOR VIRTUAL VISIT FOR:  [name]  By participating in this virtual visit I agree to the following:  I hereby voluntarily request, consent and authorize [facility] and its employed or contracted physicians, physician assistants, nurse practitioners or other licensed health care professionals (the Practitioner), to provide me with telemedicine health care services (the Services") as deemed necessary by the treating Practitioner. I acknowledge and consent to receive the Services by the Practitioner via telemedicine. I understand that the telemedicine visit will involve communicating with the Practitioner through live audiovisual communication technology and the disclosure of certain medical information by electronic transmission. I acknowledge that I have been given the opportunity to request an in-person assessment or other available alternative prior to the telemedicine visit and am voluntarily participating in the telemedicine visit.  I understand that I have the right to withhold or withdraw my consent to the use of telemedicine in the course of my care at any time, without affecting my right to future care or treatment, and that the Practitioner or I may terminate the telemedicine visit at any time. I understand that I have the right to inspect all information obtained and/or recorded in the course of the telemedicine visit and may receive copies of available information for a reasonable fee.  I understand that some of the potential risks of receiving the Services via telemedicine include:   Delay or interruption in medical evaluation due to technological equipment failure or disruption;  Information transmitted may not be sufficient (e.g. poor resolution of images) to allow for appropriate medical decision making by the  Practitioner; and/or   In rare instances, security protocols could fail, causing a breach of personal health information.  Furthermore, I acknowledge that it is my responsibility to provide information about my medical history, conditions and care that is complete and accurate to the best of my ability. I acknowledge that Practitioner's advice, recommendations, and/or decision may be based on factors not within their control, such as incomplete or inaccurate data provided by me or distortions of diagnostic images or specimens that may result from electronic transmissions. I understand that the practice of medicine is not an exact science and that Practitioner makes no warranties or guarantees regarding treatment outcomes. I acknowledge that a copy of this consent can be made available to me via my patient portal [facility] MyChart), or I can request a printed copy by calling the office of [facility].    I understand that my insurance will be billed for this visit.   I have read or had this consent read to me.  I understand the contents of this consent, which adequately explains the benefits and risks of the Services being provided via telemedicine.   I have been provided ample opportunity to ask questions regarding this consent and the Services and have had my questions answered to my satisfaction.  I give my informed consent for the services to be provided through the use of telemedicine in my medical care

## 2020-09-27 DIAGNOSIS — H6123 Impacted cerumen, bilateral: Secondary | ICD-10-CM | POA: Diagnosis not present

## 2020-10-07 ENCOUNTER — Other Ambulatory Visit: Payer: Self-pay | Admitting: Family Medicine

## 2020-10-07 DIAGNOSIS — I10 Essential (primary) hypertension: Secondary | ICD-10-CM

## 2020-10-07 DIAGNOSIS — E1159 Type 2 diabetes mellitus with other circulatory complications: Secondary | ICD-10-CM

## 2020-10-08 ENCOUNTER — Encounter: Payer: Self-pay | Admitting: Family Medicine

## 2020-10-09 ENCOUNTER — Telehealth: Payer: Self-pay | Admitting: Family Medicine

## 2020-10-09 NOTE — Telephone Encounter (Signed)
Pt is calling in stating that his irbesartan (AVAPRO) 75 MG has been recalled and he would like to know what he should do.  Pt stated that he has not taken it for today and would like to have a call back.

## 2020-10-09 NOTE — Telephone Encounter (Signed)
See my chart encounter.

## 2020-10-09 NOTE — Telephone Encounter (Signed)
Apparently several ARB is has been recalled. He can call his pharmacist and find out if his lot number was compromised, in which case he is supposed to receive a new bottle. Thanks, BJ

## 2020-11-12 ENCOUNTER — Encounter: Payer: Self-pay | Admitting: Family Medicine

## 2020-11-12 ENCOUNTER — Other Ambulatory Visit: Payer: Self-pay | Admitting: Family Medicine

## 2020-11-12 MED ORDER — ACCU-CHEK AVIVA PLUS W/DEVICE KIT: PACK | 2 refills | Status: DC

## 2020-11-12 MED ORDER — ALBUTEROL SULFATE HFA 108 (90 BASE) MCG/ACT IN AERS: 2.0000 | INHALATION_SPRAY | Freq: Four times a day (QID) | RESPIRATORY_TRACT | 1 refills | Status: DC | PRN

## 2020-11-13 ENCOUNTER — Other Ambulatory Visit: Payer: Self-pay | Admitting: Family Medicine

## 2020-11-13 ENCOUNTER — Other Ambulatory Visit: Payer: Self-pay

## 2020-11-13 DIAGNOSIS — E118 Type 2 diabetes mellitus with unspecified complications: Secondary | ICD-10-CM

## 2020-11-13 MED ORDER — ACCU-CHEK AVIVA PLUS VI STRP: ORAL_STRIP | 2 refills | Status: DC

## 2020-11-13 MED ORDER — ACCU-CHEK FASTCLIX LANCETS MISC: 1.0000 | 3 refills | Status: DC

## 2020-11-13 NOTE — Telephone Encounter (Signed)
Rx sent as requested.

## 2020-11-13 NOTE — Telephone Encounter (Signed)
Charles [name] is calling and requesting a refill for glucose blood (ACCU-CHEK AVIVA PLUS) test strip  and ACCU-CHEK FASTCLIX LANCETS MISC sent to [facility] [place], [place]  [address] [place] [address]  Phone:  [phone] Fax:  [phone]

## 2020-11-18 ENCOUNTER — Encounter: Payer: Self-pay | Admitting: Family Medicine

## 2020-11-18 MED ORDER — ACCU-CHEK FASTCLIX LANCETS MISC: 3 refills | Status: DC

## 2020-11-18 NOTE — Telephone Encounter (Signed)
Rx corrected & re-sent. 

## 2020-11-18 NOTE — Addendum Note (Signed)
Addended by: Weyman Croon E on: 11/18/2020 12:09 PM   Modules accepted: Orders

## 2020-11-18 NOTE — Telephone Encounter (Signed)
[employer] is needing clarification on directions and quantity for Accu-Chek [facility] Lancets [facility]  [facility] - [place], [place] - [address] Phone:  [phone]  Fax:  [phone]

## 2020-11-20 ENCOUNTER — Encounter: Payer: Self-pay | Admitting: Family Medicine

## 2020-11-22 MED ORDER — ACCU-CHEK AVIVA PLUS VI STRP: ORAL_STRIP | 3 refills | Status: DC

## 2020-11-26 ENCOUNTER — Other Ambulatory Visit: Payer: Self-pay

## 2020-11-26 DIAGNOSIS — E118 Type 2 diabetes mellitus with unspecified complications: Secondary | ICD-10-CM

## 2020-11-26 MED ORDER — ACCU-CHEK FASTCLIX LANCETS MISC: 3 refills | Status: DC

## 2020-11-26 MED ORDER — ACCU-CHEK GUIDE VI STRP: ORAL_STRIP | 12 refills | Status: DC

## 2020-11-27 ENCOUNTER — Other Ambulatory Visit: Payer: Self-pay | Admitting: Family Medicine

## 2020-11-27 DIAGNOSIS — E785 Hyperlipidemia, unspecified: Secondary | ICD-10-CM

## 2020-11-28 NOTE — Telephone Encounter (Signed)
Last OV 09/10/20 Last refill Glimepiride 07/12/20 #90/1                 Rosuvastatin 03/01/20 #90/3 Next OV not scheduled

## 2020-12-18 ENCOUNTER — Other Ambulatory Visit: Payer: Self-pay | Admitting: Family Medicine

## 2021-02-03 ENCOUNTER — Other Ambulatory Visit: Payer: Self-pay | Admitting: Family Medicine

## 2021-03-27 ENCOUNTER — Encounter: Payer: Self-pay | Admitting: Internal Medicine

## 2021-03-27 ENCOUNTER — Ambulatory Visit: Payer: Medicare PPO | Admitting: Internal Medicine

## 2021-03-27 ENCOUNTER — Other Ambulatory Visit: Payer: Self-pay

## 2021-03-27 VITALS — BP 160/100 | HR 100 | Ht 65.0 in | Wt 262.0 lb

## 2021-03-27 DIAGNOSIS — I10 Essential (primary) hypertension: Secondary | ICD-10-CM

## 2021-03-27 DIAGNOSIS — I739 Peripheral vascular disease, unspecified: Secondary | ICD-10-CM

## 2021-03-27 DIAGNOSIS — R197 Diarrhea, unspecified: Secondary | ICD-10-CM | POA: Diagnosis not present

## 2021-03-27 DIAGNOSIS — E785 Hyperlipidemia, unspecified: Secondary | ICD-10-CM

## 2021-03-27 NOTE — Progress Notes (Signed)
OFFICE NOTE  Chief Complaint:  Left chest pain, diarrhea  Primary Care Physician: Martinique, [name], MD  HPI:  Charles [name] is a pleasant 70 year old male who is originally from [place]. He is here for evaluation of progressive shortness of breath. He does have a history of asthma but recently is been having some cough and shortness of breath. He also has a history of diabetes on insulin, dyslipidemia, hypertension, and PAD. He tells me in 1996 he had a painful toe and was found to have rest pain in the office and was admitted for peripheral angiogram. He underwent a balloon angioplasty and ultimately required a stent per his report. We do not have those records available and we'll try to request them. He has not had follow-up since that time including no peripheral Dopplers. He's never had carotid Dopplers. He says in the past he's had a stress test but it's been many years ago. He denies any chest pain but is progressively more short of breath. He's also morbidly obese.  I saw Charles [name] back in the office today. He's recently been very anxious since he's had an episode of chest discomfort. This was after lifting a heavy couch. He says symptoms were sharp and intermittent during the first day and improved after a couple of days. What constant and did not sound anginal in nature. He did undergo nuclear stress test which was negative for ischemia with normal LV function. He had bilateral lower extremity ultrasounds which show patent vessels and no evidence of a stenosis of his previously placed peripheral stent. He also had bilateral carotid artery Dopplers which indicate mild carotid disease.  04/24/2016  Charles [name] returns today for follow-up. He's had some intermittent episodes of sharp chest pain. This is also associated with neck pain and some pain that radiates down his arms. He also reports some loose stool and diarrhea. This was worse on metformin however after  discontinuing he still has symptoms. He does get some dizziness which I suspect is related to neck pain. He denies any claudication. His Dopplers in August of last year showed normal ABIs and mild bilateral carotid disease. This is due for follow-up in August of next year. He is also overdue for a lipid profile.  03/11/2017  Charles [name] was seen today in follow-up. He reports some occasional palpitations. He denies any chest pain or worsening shortness of breath. His diarrhea that he had previously was resolved. He denies any pain with walking. Blood pressure was elevated today however he just recently took his medicines. A repeat blood pressure check came down to 134/80. 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 He is due for repeat lipid profile. He recently was put on oral blood sugar medication which caused him some shortness of breath and other symptoms however he stopped taking it and the symptoms resolved. He denies any claudication.  05/18/2018  Charles [name] was seen today in follow-up.  He saw his primary care provider Dr. Martinique in January.  At time he was complaining of chest pain.  EKG was performed and was negative.  He was referred back here for follow-up.  He reports today that he continues to have chest discomfort.  Sometimes is worse when moving his neck or lifting his left arm.  He can also have it at rest  or with some exertion.  He is not very physically active.  We had planned to work on weight loss however his weight is essentially unchanged.  We had also previously discussed the possibility of sleep apnea because of significant sleepiness, however he reports that is improved somewhat and feels like it was related to transitioning from working at night to now sleeping at night in his retirement.  Of note he said his brother actually died recently of a  massive heart attack at age 99.  He then mentioned that he was a heavy drug user.  Still, there may be some genetic predisposition to coronary disease.  He has multiple coronary risk factors including type 2 diabetes, hypertension and dyslipidemia as well as morbid obesity.  09/09/2018  Charles [name] returns today for follow-up.  Overall he is asymptomatic, denying any chest pain or worsening shortness of breath.  He denies any symptoms of claudication.  Blood pressures well controlled today.  He remains overweight.  He is cholesterol is at goal with LDL less than 70 however hemoglobin A1c recently increased up to 7.9.  Needs to make significant changes in his diet and reducing carbohydrates.  He did undergo a stress test in June 2019 which showed minimal reversible ischemia which was thought to be low risk and normal LV function.  Given the fact that he is been asymptomatic since then, I would not pursue any additional invasive work-up.  09/04/2019  Charles [name] is seen today for routine annual follow-up.  Overall he is doing well without complaints.  Denies any chest pain.  He does report some shortness of breath with exertion.  Blood pressure is top normal today.  He reports his diabetes has been a little better controlled.  EKG shows a sinus tachycardia with a pulmonary disease pattern.  Weight continues to be an issue and is fairly stable.  He is not currently on beta-blocker.  12/05/2019  Charles [name] returns today for follow-up.  He recently saw Dr. Martinique.  She had recommended increasing his Toprol from 12.5 to 25 mg daily.  He has not done that as he wanted to talk with me first.  He did undergo a sleep study which was on December 7.  I discussed that today with Dr. Claiborne Billings as the report is not yet in epic.  His AHI was 40/h and he will need to be fitted for CPAP.  Likely will get AutoPap.  The durable medical equipment company will reach out to him for this.  03/22/2020  Charles [name] is seen today again  in follow-up.  He still complains of fatigue, weight gain or inability to lose weight and difficulty sleeping.  He did undergo both a home sleep study as well as in the lab test which demonstrated at least mild sleep apnea by AHI.  Ultimately was noted to have more moderate apnea.  He has not however been set up for home health equipment as he said he was confused as to why the medical supplier told him that he had to buy equipment and it was never explained an abnormal sleep study although documentation in epic is clear that he was informed about this.  Nonetheless he likely still has untreated apnea with daytime somnolence, the need for naps and insomnia at night.  Many years ago he used to work at night and sleep during the day.  03/27/2021  Charles [name] is seen today in follow-up.  He more recently has had some left-sided chest pain.  He  says that sharp and worse with movement and on palpation.  He also has been having symptoms of diarrhea.  He feels stomach upset and he notes when he leans back particularly after eating that he does get some wave of discomfort that comes across his chest but starts in the left upper quadrant to mid epigastric area.  Blood pressure was noted to be elevated today 160/100.  He says he discussed the diarrhea before with his PCP who felt it might be related to Metformin but she subsequently increased the dose.  He has had prior colonoscopy about 5 years ago and likely is due again as he was found to have 1 polyp.  EKG today shows a normal sinus rhythm.  PMHx:  Past Medical History:  Diagnosis Date  . Abnormal carotid ultrasound 07/2015   mild bilateral stenosis, repeat in 1 year.  Dr. Debara Pickett  . Diabetes mellitus without complication ([place])   . Gout   . Hepatitis C antibody test positive 05/2015   prior infection, resolved  . History of cardiovascular stress test 9/16   Lexiscan, EF 55%, no ischemia, Dr. Debara Pickett  . Hyperlipidemia   . Hypertension   . Noncompliance   .  Obesity   . PVD (peripheral vascular disease) ([place]) 07/2015   normal ABIs, Dr. Debara Pickett    Past Surgical History:  Procedure Laterality Date  . BALLOON ANGIOPLASTY, ARTERY N/A also placed stent  . COLONOSCOPY  2016   Dr. Benson [name]    FAMHx:  Family History  Problem Relation Age of Onset  . Asthma Mother   . Heart attack Mother   . Hypertension Mother   . Hyperlipidemia Mother   . Hypertension Father   . Diabetes Father   . HIV Sister   . Liver disease Brother   . Lung cancer Brother   . Hypertension Brother     SOCHx:   reports that he quit smoking about 25 years ago. His smoking use included cigarettes. He has a 30.00 pack-year smoking history. He has never used smokeless tobacco. He reports that he does not drink alcohol and does not use drugs.  ALLERGIES:  Allergies  Allergen Reactions  . Penicillins Other (See Comments)    Per pt blacked out after injection as a child.  Did it involve swelling of the face/tongue/throat, SOB, or low BP? N Did it involve sudden or severe rash/hives, skin peeling, or any reaction on the inside of your mouth or nose? N Did you need to seek medical attention at a hospital or doctor's office? N When did it last happen?Several Decades Ago If all above answers are "NO", may proceed with cephalosporin use.     ROS: Pertinent items noted in HPI and remainder of comprehensive ROS otherwise negative.  HOME MEDS: Current Outpatient Medications  Medication Sig Dispense Refill  . Accu-Chek FastClix Lancets MISC Use to test blood sugars two times daily. 200 each 3  . albuterol (VENTOLIN HFA) 108 (90 Base) MCG/ACT inhaler Inhale 2 puffs into the lungs every 6 (six) hours as needed for wheezing or shortness of breath. 8 g 1  . Alcohol Swabs (B-D SINGLE USE SWABS REGULAR) PADS 1 Device by Does not apply route daily. 90 each 3  . allopurinol (ZYLOPRIM) 300 MG tablet TAKE 1 TABLET EVERY DAY 90 tablet 1  . amLODipine (NORVASC) 10 MG tablet TAKE 1  TABLET EVERY DAY 90 tablet 1  . aspirin 81 MG tablet Take 81 mg by mouth every evening.     Marland Kitchen  Blood Glucose Monitoring Suppl (ACCU-CHEK AVIVA PLUS) w/Device KIT Use to test blood sugar daily 1 kit 2  . glimepiride (AMARYL) 4 MG tablet TAKE 1 TABLET EVERY DAY BEFORE BREAKFAST 90 tablet 1  . glucose blood (ACCU-CHEK AVIVA PLUS) test strip Use to test 1-2 times daily. Dx: e11.65 100 strip 3  . glucose blood (ACCU-CHEK GUIDE) test strip Use to test 1-2 times daily. 100 each 12  . irbesartan (AVAPRO) 75 MG tablet TAKE 1 TABLET EVERY DAY 90 tablet 3  . lisinopril (ZESTRIL) 20 MG tablet Take 2 tablets by mouth daily.    . metFORMIN (GLUCOPHAGE) 500 MG tablet Take 1,000 mg by mouth 2 (two) times daily with a meal.    . metoprolol succinate (TOPROL-XL) 25 MG 24 hr tablet TAKE 1 TABLET EVERY DAY 90 tablet 1  . rosuvastatin (CRESTOR) 10 MG tablet TAKE 1 TABLET EVERY DAY 90 tablet 1  . famotidine (PEPCID) 20 MG tablet Take 1 tablet (20 mg total) by mouth 2 (two) times daily for 10 days. 30 tablet 0   No current facility-administered medications for this visit.    LABS/IMAGING: No results found for this or any previous visit (from the past 48 hour(s)). No results found.  WEIGHTS: Wt Readings from Last 3 Encounters:  03/27/21 262 lb (118.8 kg)  09/24/20 270 lb (122.5 kg)  09/10/20 270 lb (122.5 kg)    VITALS: BP (!) 160/100   Pulse 100   Ht '5\' 5"'  (1.651 m)   Wt 262 lb (118.8 kg)   SpO2 96%   BMI 43.60 kg/m   EXAM: General appearance: alert, no distress and morbidly obese Neck: no carotid bruit and no JVD Lungs: clear to auscultation bilaterally Heart: regular rate and rhythm, S1, S2 normal, no murmur, click, rub or gallop Abdomen: morbidly obese Extremities: extremities normal, atraumatic, no cyanosis or edema Pulses: 2+ and symmetric Skin: Skin color, texture, turgor normal. No rashes or lesions Neurologic: Mental status: Alert, oriented, thought content appropriate Psych:  Pleasant  EKG: Normal sinus rhythm at 100-personally reviewed  ASSESSMENT: 1. Atypical chest pain 2. Diarrhea 3. Dyspnea on exertion low risk Myoview with minimal reversible ischemia as of June/2019 4. PAD with prior stent to the right leg in 1996 - normal ABIs 5. Asthma 6. Type 2 diabetes 7. Hypertension 8. Dyslipidemia 9. Morbid obesity 10. Cervicalgia/dizziness 11. OSA - not yet on CPAP  PLAN: 1.   Charles [name] has lost some weight recently however this may be related to ongoing issues with diarrhea.  This could be related to Metformin and I advised him to decrease his dose from 1000 mg twice daily to 500 mg twice daily.  As he is almost due for a repeat colonoscopy, will refer him back to [facility] endoscopy center to see Dr. Benson [name].  Finally, his chest pain is atypical and I do not think merits any further work-up.  His blood pressure is high today though and a repeat came down to 150/90.  I would like for him to acquire a blood pressure cuff and take home readings which she can route to me and then I may be able to further adjust his medications.  Plan follow-up with me annually or sooner as necessary.  Charles Casino, MD, [facility], [place] Director of the Advanced Lipid Disorders &  Cardiovascular Risk Reduction Clinic Diplomate of the American Board of Clinical Lipidology Attending Cardiologist  Direct Dial: [phone]  Fax: [phone]  Website:  [email or web address] 03/27/2021, 4:18 PM

## 2021-03-27 NOTE — Patient Instructions (Signed)
Medication Instructions:  DECREASE metformin to 500mg  twice daily  *If you need a refill on your cardiac medications before your next appointment, please call your pharmacy*   Follow-Up: At [facility], you and your health needs are our priority.  As part of our continuing mission to provide you with exceptional heart care, we have created designated Provider Care Teams.  These Care Teams include your primary Cardiologist (physician) and Advanced Practice Providers (APPs -  Physician Assistants and Nurse Practitioners) who all work together to provide you with the care you need, when you need it.  We recommend signing up for the patient portal called "MyChart".  Sign up information is provided on this After Visit Summary.  MyChart is used to connect with patients for Virtual Visits (Telemedicine).  Patients are able to view lab/test results, encounter notes, upcoming appointments, etc.  Non-urgent messages can be sent to your provider as well.   To learn more about what you can do with MyChart, go to [facility].    Your next appointment:   3 month(s)  The format for your next appointment:   In Person  Provider:   You may see [email or web address], MD or one of the following Advanced Practice Providers on your designated Care Team:    Chrystie Nose, PA-C  Azalee Course, Micah Flesher or   [place], Judy Pimple  OK to schedule at [facility] if needed  Other Instructions You have been referred to Dr. [facility] - gastroenterologist   Dr. Jeani Hawking advised that you get a new BP cuff ARM cuff is preferred over wrist cuff

## 2021-04-09 DIAGNOSIS — R194 Change in bowel habit: Secondary | ICD-10-CM | POA: Diagnosis not present

## 2021-04-09 DIAGNOSIS — Z86010 Personal history of colonic polyps: Secondary | ICD-10-CM | POA: Diagnosis not present

## 2021-04-09 DIAGNOSIS — R079 Chest pain, unspecified: Secondary | ICD-10-CM | POA: Diagnosis not present

## 2021-04-09 DIAGNOSIS — R197 Diarrhea, unspecified: Secondary | ICD-10-CM | POA: Diagnosis not present

## 2021-04-21 ENCOUNTER — Other Ambulatory Visit: Payer: Self-pay | Admitting: Family Medicine

## 2021-04-21 DIAGNOSIS — E785 Hyperlipidemia, unspecified: Secondary | ICD-10-CM

## 2021-05-16 ENCOUNTER — Ambulatory Visit: Payer: Medicare PPO | Admitting: Family Medicine

## 2021-05-19 ENCOUNTER — Other Ambulatory Visit: Payer: Self-pay | Admitting: Family Medicine

## 2021-05-20 ENCOUNTER — Ambulatory Visit: Payer: Medicare PPO | Admitting: Family Medicine

## 2021-05-20 ENCOUNTER — Encounter: Payer: Self-pay | Admitting: Family Medicine

## 2021-05-20 ENCOUNTER — Other Ambulatory Visit: Payer: Self-pay

## 2021-05-20 VITALS — BP 150/90 | HR 96 | Resp 16 | Ht 65.0 in | Wt 261.0 lb

## 2021-05-20 DIAGNOSIS — E1159 Type 2 diabetes mellitus with other circulatory complications: Secondary | ICD-10-CM

## 2021-05-20 DIAGNOSIS — Z6841 Body Mass Index (BMI) 40.0 and over, adult: Secondary | ICD-10-CM | POA: Diagnosis not present

## 2021-05-20 DIAGNOSIS — I10 Essential (primary) hypertension: Secondary | ICD-10-CM | POA: Diagnosis not present

## 2021-05-20 LAB — BASIC METABOLIC PANEL
BUN: 17 mg/dL (ref 6–23)
CO2: 23 mEq/L (ref 19–32)
Calcium: 9.7 mg/dL (ref 8.4–10.5)
Chloride: 102 mEq/L (ref 96–112)
Creatinine, Ser: 1.25 mg/dL (ref 0.40–1.50)
GFR: 58.8 mL/min — ABNORMAL LOW (ref >60.00)
Glucose, Bld: 149 mg/dL — ABNORMAL HIGH (ref 70–99)
Potassium: 3.4 mEq/L — ABNORMAL LOW (ref 3.5–5.1)
Sodium: 138 mEq/L (ref 135–145)

## 2021-05-20 LAB — POCT GLYCOSYLATED HEMOGLOBIN (HGB A1C): Hemoglobin A1C: 7.7 % — AB (ref 4.0–5.6)

## 2021-05-20 MED ORDER — METOPROLOL SUCCINATE ER 50 MG PO TB24: 50.0000 mg | ORAL_TABLET | Freq: Every day | ORAL | 1 refills | Status: DC

## 2021-05-20 NOTE — Progress Notes (Signed)
HPI: Charles [name] is a 70 y.o. male, who is here today for follow up.   He was last seen on 09/10/20. No new problems since his last visit.  HTN:  Negative for severe/frequent headache, visual changes, chest pain, worsening dyspnea, palpitation, focal weakness, or edema. He is on Irbesartan 75 mg daily, Amlodipine 10 mg daily,and Metoprolol Succinate 25 mg daily. BP mildly elevated today. He is not checking BP at home.  BP at his cardiologist's office was 160/100.  Lab Results  Component Value Date   CREATININE 1.19 09/18/2020   BUN 12 09/18/2020   NA 139 09/18/2020   K 3.7 09/18/2020   CL 104 09/18/2020   CO2 24 09/18/2020   DM II: He is on Metformin 500 mg am and 1000 mg at night and Glimepiride 4 mg daily. His health insurance has not covered other medications. BS's lately a "little" high, > 140. Occasionally he has 60-70's. Negative for polydipsia,polyuria, or polyphagia.  Lab Results  Component Value Date   HGBA1C 8.4 (A) 09/10/2020   He has made some changes in his diet, eating [place] and fish. Cut down bread intake. It is becoming difficult to afford healthier food options due to cost of groceries.  He is walking sometimes, he is not exercising regularly, aggravates joint pain.  Review of Systems  Constitutional: Negative for activity change, appetite change and fever.  HENT: Negative for mouth sores, nosebleeds and sore throat.   Respiratory: Negative for cough and wheezing.   Gastrointestinal: Negative for abdominal pain, nausea and vomiting.       No changes in bowel habits, chronic diarrhea.  Genitourinary: Negative for decreased urine volume, dysuria and hematuria.  Musculoskeletal: Positive for arthralgias. Negative for gait problem.  Neurological: Negative for syncope, facial asymmetry and weakness.  Rest of ROS, see pertinent positives sand negatives in HPI  Current Outpatient Medications on File Prior to Visit  Medication Sig Dispense  Refill  . Accu-Chek FastClix Lancets MISC Use to test blood sugars two times daily. 200 each 3  . albuterol (VENTOLIN HFA) 108 (90 Base) MCG/ACT inhaler Inhale 2 puffs into the lungs every 6 (six) hours as needed for wheezing or shortness of breath. 8 g 1  . Alcohol Swabs (B-D SINGLE USE SWABS REGULAR) PADS 1 Device by Does not apply route daily. 90 each 3  . allopurinol (ZYLOPRIM) 300 MG tablet TAKE 1 TABLET EVERY DAY 90 tablet 1  . amLODipine (NORVASC) 10 MG tablet TAKE 1 TABLET EVERY DAY 90 tablet 1  . aspirin 81 MG tablet Take 81 mg by mouth every evening.     Marland Kitchen glimepiride (AMARYL) 4 MG tablet TAKE 1 TABLET EVERY DAY BEFORE BREAKFAST 90 tablet 0  . glucose blood (ACCU-CHEK GUIDE) test strip Use to test 1-2 times daily. 100 each 12  . irbesartan (AVAPRO) 75 MG tablet TAKE 1 TABLET EVERY DAY 90 tablet 3  . metFORMIN (GLUCOPHAGE) 500 MG tablet TAKE 2 TABLETS TWICE DAILY WITH A MEAL  (DOSE  INCREASED) 360 tablet 0  . rosuvastatin (CRESTOR) 10 MG tablet TAKE 1 TABLET EVERY DAY 90 tablet 0   No current facility-administered medications on file prior to visit.   Past Medical History:  Diagnosis Date  . Abnormal carotid ultrasound 07/2015   mild bilateral stenosis, repeat in 1 year.  Dr. Rennis Golden  . Diabetes mellitus without complication (HCC)   . Gout   . Hepatitis C antibody test positive 05/2015   prior infection, resolved  .  History of cardiovascular stress test 9/16   Lexiscan, EF 55%, no ischemia, Dr. Rennis Golden  . Hyperlipidemia   . Hypertension   . Noncompliance   . Obesity   . PVD (peripheral vascular disease) (HCC) 07/2015   normal ABIs, Dr. Rennis Golden   Allergies  Allergen Reactions  . Penicillins Other (See Comments)    Per pt blacked out after injection as a child.  Did it involve swelling of the face/tongue/throat, SOB, or low BP? N Did it involve sudden or severe rash/hives, skin peeling, or any reaction on the inside of your mouth or nose? N Did you need to seek medical attention  at a hospital or doctor's office? N When did it last happen?Several Decades Ago If all above answers are "NO", may proceed with cephalosporin use.     Social History   Socioeconomic History  . Marital status: Divorced    Spouse name: Not on file  . Number of children: Not on file  . Years of education: Not on file  . Highest education level: Not on file  Occupational History  . Not on file  Tobacco Use  . Smoking status: Former Smoker    Packs/day: 1.00    Years: 30.00    Pack years: 30.00    Types: Cigarettes    Quit date: 07/31/1995    Years since quitting: 25.8  . Smokeless tobacco: Never Used  Vaping Use  . Vaping Use: Never used  Substance and Sexual Activity  . Alcohol use: No    Alcohol/week: 0.0 standard drinks  . Drug use: No  . Sexual activity: Not on file  Other Topics Concern  . Not on file  Social History Narrative  . Not on file   Social Determinants of Health   Financial Resource Strain: Not on file  Food Insecurity: Not on file  Transportation Needs: Not on file  Physical Activity: Not on file  Stress: Not on file  Social Connections: Not on file   Vitals:   05/20/21 0850 05/20/21 0940  BP: (!) 150/90   Pulse: (!) 113 96  Resp: 16   SpO2: 97%     Wt Readings from Last 3 Encounters:  05/20/21 261 lb (118.4 kg)  03/27/21 262 lb (118.8 kg)  09/24/20 270 lb (122.5 kg)   Body mass index is 43.43 kg/m.  Physical Exam Vitals and nursing note reviewed.  Constitutional:      General: He is not in acute distress.    Appearance: He is well-developed.  HENT:     Head: Normocephalic and atraumatic.     Mouth/Throat:     Mouth: Mucous membranes are moist.  Eyes:     Conjunctiva/sclera: Conjunctivae normal.  Cardiovascular:     Rate and Rhythm: Normal rate and regular rhythm.     Pulses:          Dorsalis pedis pulses are 2+ on the right side and 2+ on the left side.     Heart sounds: No murmur heard.     Comments: Trace pitting LE  edema, bilateral. Pulmonary:     Effort: Pulmonary effort is normal. No respiratory distress.     Breath sounds: Normal breath sounds.  Abdominal:     Palpations: Abdomen is soft. There is no hepatomegaly or mass.     Tenderness: There is no abdominal tenderness.  Musculoskeletal:     Right lower leg: Pitting Edema present.     Left lower leg: Pitting Edema present.  Lymphadenopathy:  Cervical: No cervical adenopathy.  Skin:    General: Skin is warm.     Findings: No erythema or rash.  Neurological:     Mental Status: He is alert and oriented to person, place, and time.     Cranial Nerves: No cranial nerve deficit.     Gait: Gait normal.  Psychiatric:     Comments: Well groomed, good eye contact.   ASSESSMENT AND PLAN:  Charles [name] was seen today for follow-up.  Orders Placed This Encounter  Procedures  . Basic metabolic panel  . POC HgB A1c   Lab Results  Component Value Date   HGBA1C 7.7 (A) 05/20/2021   Lab Results  Component Value Date   CREATININE 1.25 05/20/2021   BUN 17 05/20/2021   NA 138 05/20/2021   K 3.4 (L) 05/20/2021   CL 102 05/20/2021   CO2 23 05/20/2021    Type 2 diabetes mellitus with vascular disease (HCC) HgA1C improved, 7.7. We have had difficulties trying to get other meds approved, so no changes in metformin or Glimepiride for now. Regular exercise and healthy diet with avoidance of added sugar food intake is an important part of treatment and recommended. Annual eye exam, periodic dental and foot care recommended.  Essential hypertension BP re-checked 165/90 LUE. We discussed possible complications of elevated BP. Initially mild tachycardia, re-checked and HR in normal high range;so recommend increasing Metoprolol succinate from 25 mg to 50 mg daily. Recommend taking Amlodipine 10 mg at bedtime. No changes in Irbesartan dose. Recommend monitoring BP and HR at home. Continue low salt diet.   Morbid obesity with BMI of  40.0-44.9, adult (HCC) Wt in 08/2020 was 270 Lb, so has lost about 8-9 Lb since his last visit. Encouraged consistency with following a healthful diet and engaging in low impact physical activity.   Return in about 4 months (around 09/19/2021) for HNT,HLD,and DM II.   Kaiyla Stahly G. [place], MD  [facility]. [place] office.   A few things to remember from today's visit:  Type 2 diabetes mellitus with vascular disease (HCC) - Plan: POC HgB A1c  Essential hypertension - Plan: metoprolol succinate (TOPROL-XL) 50 MG 24 hr tablet  If you need refills please call your pharmacy. Do not use My Chart to request refills or for acute issues that need immediate attention.   Keep working on [employer], you has lost 9 Lb since your last visit. Because you heart rate is up today we increased dose of Metoprolol succinate from 25 mg to 50 mg. It is important to monitor blood pressure and pulse at home. For now no changes in diabetic medications. Take Amlodipine at bedtime.  Fasting labs next visit.  Please be sure medication list is accurate. If a new problem present, please set up appointment sooner than planned today.

## 2021-05-20 NOTE — Assessment & Plan Note (Addendum)
HgA1C improved, 7.7. We have had difficulties trying to get other meds approved, so no changes in metformin or Glimepiride for now. Regular exercise and healthy diet with avoidance of added sugar food intake is an important part of treatment and recommended. Annual eye exam, periodic dental and foot care recommended.

## 2021-05-20 NOTE — Assessment & Plan Note (Addendum)
BP re-checked 165/90 LUE. We discussed possible complications of elevated BP. Initially mild tachycardia, re-checked and HR in normal high range;so recommend increasing Metoprolol succinate from 25 mg to 50 mg daily. Recommend taking Amlodipine 10 mg at bedtime. No changes in Irbesartan dose. Recommend monitoring BP and HR at home. Continue low salt diet.

## 2021-05-20 NOTE — Assessment & Plan Note (Addendum)
Wt in 08/2020 was 270 Lb, so has lost about 8-9 Lb since his last visit. Encouraged consistency with following a healthful diet and engaging in low impact physical activity.

## 2021-05-20 NOTE — Patient Instructions (Addendum)
A few things to remember from today's visit:  Type 2 diabetes mellitus with vascular disease (HCC) - Plan: POC HgB A1c  Essential hypertension - Plan: metoprolol succinate (TOPROL-XL) 50 MG 24 hr tablet  If you need refills please call your pharmacy. Do not use My Chart to request refills or for acute issues that need immediate attention.   Keep working on [employer], you has lost 9 Lb since your last visit. Because you heart rate is up today we increased dose of Metoprolol succinate from 25 mg to 50 mg. It is important to monitor blood pressure and pulse at home. For now no changes in diabetic medications. Take Amlodipine at bedtime.  Fasting labs next visit.  Please be sure medication list is accurate. If a new problem present, please set up appointment sooner than planned today.

## 2021-08-04 ENCOUNTER — Other Ambulatory Visit: Payer: Self-pay | Admitting: Family Medicine

## 2021-08-04 DIAGNOSIS — I10 Essential (primary) hypertension: Secondary | ICD-10-CM

## 2021-08-04 DIAGNOSIS — E1159 Type 2 diabetes mellitus with other circulatory complications: Secondary | ICD-10-CM

## 2021-08-06 ENCOUNTER — Telehealth: Payer: Self-pay | Admitting: Family Medicine

## 2021-08-06 NOTE — Telephone Encounter (Signed)
Left message for patient to call back and schedule Medicare Annual Wellness Visit (AWV) either virtually or in office.  Left both  my jabber number [phone] and office number    Last AWV 09/10/20 please schedule at anytime with [facility] Nurse Health Advisor 1 or 2   This should be a 45 minute visit.  

## 2021-08-11 ENCOUNTER — Telehealth: Payer: Self-pay | Admitting: Pharmacist

## 2021-08-11 NOTE — Progress Notes (Signed)
    Chronic Care Management Pharmacy Assistant   Name: Charles [name]  MRN: [id number] DOB: 08/29/1951  Reason for Encounter: Patient Assistance Follow up /  [employer] :Trulicity   Per PTM call to manufacturer to check on the status of submitted Trulicity Application, spoke to [facility] [phone] she states no application or account on file for patient. Will check with [facility] to see if ok to resubmit.   Medications: Outpatient Encounter Medications as of 08/11/2021  Medication Sig   Accu-Chek FastClix Lancets MISC Use to test blood sugars two times daily.   albuterol (VENTOLIN HFA) 108 (90 Base) MCG/ACT inhaler Inhale 2 puffs into the lungs every 6 (six) hours as needed for wheezing or shortness of breath.   Alcohol Swabs (B-D SINGLE USE SWABS REGULAR) PADS 1 Device by Does not apply route daily.   allopurinol (ZYLOPRIM) 300 MG tablet TAKE 1 TABLET EVERY DAY   amLODipine (NORVASC) 10 MG tablet TAKE 1 TABLET EVERY DAY   aspirin 81 MG tablet Take 81 mg by mouth every evening.    glimepiride (AMARYL) 4 MG tablet TAKE 1 TABLET EVERY DAY BEFORE BREAKFAST   glucose blood (ACCU-CHEK GUIDE) test strip Use to test 1-2 times daily.   irbesartan (AVAPRO) 75 MG tablet TAKE 1 TABLET EVERY DAY   metFORMIN (GLUCOPHAGE) 500 MG tablet TAKE 2 TABLETS TWICE DAILY WITH A MEAL  (DOSE  INCREASED)   metoprolol succinate (TOPROL-XL) 50 MG 24 hr tablet Take 1 tablet (50 mg total) by mouth daily. Take with or immediately following a meal.   rosuvastatin (CRESTOR) 10 MG tablet TAKE 1 TABLET EVERY DAY   No facility-administered encounter medications on file as of 08/11/2021.     Pamala Duffel CMA Clinical Pharmacist Assistant [phone]

## 2021-08-20 ENCOUNTER — Ambulatory Visit (INDEPENDENT_AMBULATORY_CARE_PROVIDER_SITE_OTHER): Payer: Medicare PPO

## 2021-08-20 DIAGNOSIS — Z0000 Encounter for general adult medical examination without abnormal findings: Secondary | ICD-10-CM

## 2021-08-20 DIAGNOSIS — Z599 Problem related to housing and economic circumstances, unspecified: Secondary | ICD-10-CM

## 2021-08-20 NOTE — Patient Instructions (Signed)
Charles [name] , Thank you for taking time to come for your Medicare Wellness Visit. I appreciate your ongoing commitment to your health goals. Please review the following plan we discussed and let me know if I can assist you in the future.   Screening recommendations/referrals: Colonoscopy: Done 05/28/17 cologuard repeat 3 years  Recommended yearly ophthalmology/optometry visit for glaucoma screening and checkup Recommended yearly dental visit for hygiene and checkup  Vaccinations: Influenza vaccine: Due Pneumococcal vaccine: Due Tdap vaccine: Discontinued Shingles vaccine: Shingrix discussed. Please contact your pharmacy for coverage information.    Covid-19: Completed 2/6, 2/27, 09/16/20 & 04/02/21  Advanced directives: Advance directive discussed with you today. Even though you declined this today please call our office should you change your mind and we can give you the proper paperwork for you to fill out.  Conditions/risks identified: Lose weight  Next appointment: Follow up in one year for your annual wellness visit.   Preventive Care 78 Years and Older, Male Preventive care refers to lifestyle choices and visits with your health care provider that can promote health and wellness. What does preventive care include? A yearly physical exam. This is also called an annual well check. Dental exams once or twice a year. Routine eye exams. Ask your health care provider how often you should have your eyes checked. Personal lifestyle choices, including: Daily care of your teeth and gums. Regular physical activity. Eating a healthy diet. Avoiding tobacco and drug use. Limiting alcohol use. Practicing safe sex. Taking low doses of aspirin every day. Taking vitamin and mineral supplements as recommended by your health care provider. What happens during an annual well check? The services and screenings done by your health care provider during your annual well check will depend on your age,  overall health, lifestyle risk factors, and family history of disease. Counseling  Your health care provider may ask you questions about your: Alcohol use. Tobacco use. Drug use. Emotional well-being. Home and relationship well-being. Sexual activity. Eating habits. History of falls. Memory and ability to understand (cognition). Work and work Astronomer. Screening  You may have the following tests or measurements: Height, weight, and BMI. Blood pressure. Lipid and cholesterol levels. These may be checked every 5 years, or more frequently if you are over 57 years old. Skin check. Lung cancer screening. You may have this screening every year starting at age 21 if you have a 30-pack-year history of smoking and currently smoke or have quit within the past 15 years. Fecal occult blood test (FOBT) of the stool. You may have this test every year starting at age 70. Flexible sigmoidoscopy or colonoscopy. You may have a sigmoidoscopy every 5 years or a colonoscopy every 10 years starting at age 53. Prostate cancer screening. Recommendations will vary depending on your family history and other risks. Hepatitis C blood test. Hepatitis B blood test. Sexually transmitted disease (STD) testing. Diabetes screening. This is done by checking your blood sugar (glucose) after you have not eaten for a while (fasting). You may have this done every 1-3 years. Abdominal aortic aneurysm (AAA) screening. You may need this if you are a current or former smoker. Osteoporosis. You may be screened starting at age 77 if you are at high risk. Talk with your health care provider about your test results, treatment options, and if necessary, the need for more tests. Vaccines  Your health care provider may recommend certain vaccines, such as: Influenza vaccine. This is recommended every year. Tetanus, diphtheria, and acellular pertussis (Tdap, Td)  vaccine. You may need a Td booster every 10 years. Zoster vaccine.  You may need this after age 33. Pneumococcal 13-valent conjugate (PCV13) vaccine. One dose is recommended after age 36. Pneumococcal polysaccharide (PPSV23) vaccine. One dose is recommended after age 68. Talk to your health care provider about which screenings and vaccines you need and how often you need them. This information is not intended to replace advice given to you by your health care provider. Make sure you discuss any questions you have with your health care provider. Document Released: 01/03/2016 Document Revised: 08/26/2016 Document Reviewed: 10/08/2015 Elsevier Interactive Patient Education  2017 [place] Prevention in the Home Falls can cause injuries. They can happen to people of all ages. There are many things you can do to make your home safe and to help prevent falls. What can I do on the outside of my home? Regularly fix the edges of walkways and driveways and fix any cracks. Remove anything that might make you trip as you walk through a door, such as a raised step or threshold. Trim any bushes or trees on the path to your home. Use bright outdoor lighting. Clear any walking paths of anything that might make someone trip, such as rocks or tools. Regularly check to see if handrails are loose or broken. Make sure that both sides of any steps have handrails. Any raised decks and porches should have guardrails on the edges. Have any leaves, snow, or ice cleared regularly. Use sand or salt on walking paths during winter. Clean up any spills in your garage right away. This includes oil or grease spills. What can I do in the bathroom? Use night lights. Install grab bars by the toilet and in the tub and shower. Do not use towel bars as grab bars. Use non-skid mats or decals in the tub or shower. If you need to sit down in the shower, use a plastic, non-slip stool. Keep the floor dry. Clean up any water that spills on the floor as soon as it happens. Remove soap  buildup in the tub or shower regularly. Attach bath mats securely with double-sided non-slip rug tape. Do not have throw rugs and other things on the floor that can make you trip. What can I do in the bedroom? Use night lights. Make sure that you have a light by your bed that is easy to reach. Do not use any sheets or blankets that are too big for your bed. They should not hang down onto the floor. Have a firm chair that has side arms. You can use this for support while you get dressed. Do not have throw rugs and other things on the floor that can make you trip. What can I do in the kitchen? Clean up any spills right away. Avoid walking on wet floors. Keep items that you use a lot in easy-to-reach places. If you need to reach something above you, use a strong step stool that has a grab bar. Keep electrical cords out of the way. Do not use floor polish or wax that makes floors slippery. If you must use wax, use non-skid floor wax. Do not have throw rugs and other things on the floor that can make you trip. What can I do with my stairs? Do not leave any items on the stairs. Make sure that there are handrails on both sides of the stairs and use them. Fix handrails that are broken or loose. Make sure that handrails are as long  as the stairways. Check any carpeting to make sure that it is firmly attached to the stairs. Fix any carpet that is loose or worn. Avoid having throw rugs at the top or bottom of the stairs. If you do have throw rugs, attach them to the floor with carpet tape. Make sure that you have a light switch at the top of the stairs and the bottom of the stairs. If you do not have them, ask someone to add them for you. What else can I do to help prevent falls? Wear shoes that: Do not have high heels. Have rubber bottoms. Are comfortable and fit you well. Are closed at the toe. Do not wear sandals. If you use a stepladder: Make sure that it is fully opened. Do not climb a closed  stepladder. Make sure that both sides of the stepladder are locked into place. Ask someone to hold it for you, if possible. Clearly mark and make sure that you can see: Any grab bars or handrails. First and last steps. Where the edge of each step is. Use tools that help you move around (mobility aids) if they are needed. These include: Canes. Walkers. Scooters. Crutches. Turn on the lights when you go into a dark area. Replace any light bulbs as soon as they burn out. Set up your furniture so you have a clear path. Avoid moving your furniture around. If any of your floors are uneven, fix them. If there are any pets around you, be aware of where they are. Review your medicines with your doctor. Some medicines can make you feel dizzy. This can increase your chance of falling. Ask your doctor what other things that you can do to help prevent falls. This information is not intended to replace advice given to you by your health care provider. Make sure you discuss any questions you have with your health care provider. Document Released: 10/03/2009 Document Revised: 05/14/2016 Document Reviewed: 01/11/2015 Elsevier Interactive Patient Education  2017 [employer].

## 2021-08-20 NOTE — Progress Notes (Signed)
Virtual Visit via Telephone Note  I connected with  Charles [name] on 08/20/21 at 10:15 AM EDT by telephone and verified that I am speaking with the correct person using two identifiers.  Location: Patient: home Provider: office Persons participating in the virtual visit: patient/Nurse Health Advisor   I discussed the limitations, risks, security and privacy concerns of performing an evaluation and management service by telephone and the availability of in person appointments. The patient expressed understanding and agreed to proceed.  Interactive audio and video telecommunications were attempted between this nurse and patient, however failed, due to patient having technical difficulties OR patient did not have access to video capability.  We continued and completed visit with audio only.  Some vital signs may be absent or patient reported.   Marzella Schlein, LPN   Subjective:   Charles [name] is a 70 y.o. male who presents for Medicare Annual/Subsequent preventive examination.  Review of Systems     Cardiac Risk Factors include: diabetes mellitus;hypertension;dyslipidemia;advanced age (>64men, >46 women);obesity (BMI >30kg/m2);male gender     Objective:    There were no vitals filed for this visit. There is no height or weight on file to calculate BMI.  Advanced Directives 08/20/2021 05/17/2020 11/27/2019 02/04/2018 10/30/2015 08/26/2014  Does Patient Have a Medical Advance Directive? No No No No No No  Would patient like information on creating a medical advance directive? No - Patient declined - Yes (MAU/Ambulatory/Procedural Areas - Information given) - - No - patient declined information    Current Medications (verified) Outpatient Encounter Medications as of 08/20/2021  Medication Sig   Accu-Chek FastClix Lancets MISC Use to test blood sugars two times daily.   albuterol (VENTOLIN HFA) 108 (90 Base) MCG/ACT inhaler Inhale 2 puffs into the lungs every 6 (six) hours as needed  for wheezing or shortness of breath.   Alcohol Swabs (B-D SINGLE USE SWABS REGULAR) PADS 1 Device by Does not apply route daily.   allopurinol (ZYLOPRIM) 300 MG tablet TAKE 1 TABLET EVERY DAY   amLODipine (NORVASC) 10 MG tablet TAKE 1 TABLET EVERY DAY   aspirin 81 MG tablet Take 81 mg by mouth every evening.    glimepiride (AMARYL) 4 MG tablet TAKE 1 TABLET EVERY DAY BEFORE BREAKFAST   glucose blood (ACCU-CHEK GUIDE) test strip Use to test 1-2 times daily.   irbesartan (AVAPRO) 75 MG tablet TAKE 1 TABLET EVERY DAY   metFORMIN (GLUCOPHAGE) 500 MG tablet TAKE 2 TABLETS TWICE DAILY WITH A MEAL  (DOSE  INCREASED)   metoprolol succinate (TOPROL-XL) 50 MG 24 hr tablet Take 1 tablet (50 mg total) by mouth daily. Take with or immediately following a meal.   rosuvastatin (CRESTOR) 10 MG tablet TAKE 1 TABLET EVERY DAY   No facility-administered encounter medications on file as of 08/20/2021.    Allergies (verified) Penicillins   History: Past Medical History:  Diagnosis Date   Abnormal carotid ultrasound 07/2015   mild bilateral stenosis, repeat in 1 year.  Dr. Rennis Golden   Diabetes mellitus without complication (HCC)    Gout    Hepatitis C antibody test positive 05/2015   prior infection, resolved   History of cardiovascular stress test 9/16   Lexiscan, EF 55%, no ischemia, Dr. Rennis Golden   Hyperlipidemia    Hypertension    Noncompliance    Obesity    PVD (peripheral vascular disease) (HCC) 07/2015   normal ABIs, Dr. Rennis Golden   Past Surgical History:  Procedure Laterality Date   BALLOON ANGIOPLASTY, ARTERY  N/A also placed stent   COLONOSCOPY  2016   Dr. Elnoria Howard   Family History  Problem Relation Age of Onset   Asthma Mother    Heart attack Mother    Hypertension Mother    Hyperlipidemia Mother    Hypertension Father    Diabetes Father    HIV Sister    Liver disease Brother    Lung cancer Brother    Hypertension Brother    Social History   Socioeconomic History   Marital status: Divorced     Spouse name: Not on file   Number of children: Not on file   Years of education: Not on file   Highest education level: Not on file  Occupational History   Not on file  Tobacco Use   Smoking status: Former    Packs/day: 1.00    Years: 30.00    Pack years: 30.00    Types: Cigarettes    Quit date: 07/31/1995    Years since quitting: 26.0   Smokeless tobacco: Never  Vaping Use   Vaping Use: Never used  Substance and Sexual Activity   Alcohol use: No    Alcohol/week: 0.0 standard drinks   Drug use: No   Sexual activity: Not on file  Other Topics Concern   Not on file  Social History Narrative   Not on file   Social Determinants of Health   Financial Resource Strain: Medium Risk   Difficulty of Paying Living Expenses: Somewhat hard  Food Insecurity: No Food Insecurity   Worried About Programme researcher, broadcasting/film/video in the Last Year: Never true   Ran Out of Food in the Last Year: Never true  Transportation Needs: No Transportation Needs   Lack of Transportation (Medical): No   Lack of Transportation (Non-Medical): No  Physical Activity: Inactive   Days of Exercise per Week: 0 days   Minutes of Exercise per Session: 0 min  Stress: No Stress Concern Present   Feeling of Stress : Only a little  Social Connections: Socially Isolated   Frequency of Communication with Friends and Family: Once a week   Frequency of Social Gatherings with Friends and Family: Once a week   Attends Religious Services: Never   Database administrator or Organizations: No   Attends Engineer, structural: Never   Marital Status: Divorced    Tobacco Counseling Counseling given: Not Answered   Clinical Intake:  Pre-visit preparation completed: Yes  Pain : No/denies pain     Nutritional Status: BMI > 30  Obese Nutritional Risks: Nausea/ vomitting/ diarrhea (loose stool) Diabetes: Yes CBG done?: Yes (145) CBG resulted in Enter/ Edit results?: No Did pt. bring in CBG monitor from home?:  No  How often do you need to have someone help you when you read instructions, pamphlets, or other written materials from your doctor or pharmacy?: 1 - Never  Diabetic?Nutrition Risk Assessment:  Has the patient had any N/V/D within the last 2 months?  Yes  Does the patient have any non-healing wounds?  No  Has the patient had any unintentional weight loss or weight gain?  No   Diabetes:  Is the patient diabetic?  Yes  If diabetic, was a CBG obtained today?  Yes  Did the patient bring in their glucometer from home?  No  How often do you monitor your CBG's? daily.   Financial Strains and Diabetes Management:  Are you having any financial strains with the device, your supplies or your medication? No .  Does the patient want to be seen by Chronic Care Management for management of their diabetes?  No  Would the patient like to be referred to a Nutritionist or for Diabetic Management?  No   Diabetic Exams:  Diabetic Eye Exam: Overdue for diabetic eye exam. Pt has been advised about the importance in completing this exam. Patient advised to call and schedule an eye exam. Diabetic Foot Exam: Completed 08/20/21   Interpreter Needed?: No  Information entered by :: Lanier Ensign, LPN   Activities of Daily Living In your present state of health, do you have any difficulty performing the following activities: 08/20/2021 09/10/2020  Hearing? Y N  Comment HOH -  Vision? N N  Difficulty concentrating or making decisions? N N  Walking or climbing stairs? N N  Dressing or bathing? N N  Doing errands, shopping? N N  Preparing Food and eating ? N -  Using the Toilet? N -  In the past six months, have you accidently leaked urine? N -  Do you have problems with loss of bowel control? N -  Managing your Medications? N -  Managing your Finances? N -  Some recent data might be hidden    Patient Care Team: [place], [name], MD as PCP - General (Family Medicine) Rennis Golden Lisette Abu, MD as PCP -  Cardiology (Cardiology) Rennis Golden Lisette Abu, MD as Consulting Physician (Cardiology)  Indicate any recent Medical Services you may have received from other than [facility] providers in the past year (date may be approximate).     Assessment:   This is a routine wellness examination for Jovaughn.  Hearing/Vision screen Hearing Screening - Comments:: Pt stated HOH  Vision Screening - Comments:: Pt will follow up with new provider   Dietary issues and exercise activities discussed: Current Exercise Habits: The patient does not participate in regular exercise at present   Goals Addressed             This Visit's Progress    Patient Stated       Lose weight       Depression Screen PHQ 2/9 Scores 08/20/2021 09/10/2020 11/21/2019 10/07/2018 08/14/2017  PHQ - 2 Score 1 0 0 0 0    Fall Risk Fall Risk  08/20/2021 09/10/2020  Falls in the past year? 0 0  Number falls in past yr: 0 0  Injury with Fall? 0 0  Risk for fall due to : Impaired vision;Impaired balance/gait -  Follow up Falls prevention discussed Education provided    FALL RISK PREVENTION PERTAINING TO THE HOME:  Any stairs in or around the home? No  If so, are there any without handrails? No  Home free of loose throw rugs in walkways, pet beds, electrical cords, etc? Yes  Adequate lighting in your home to reduce risk of falls? Yes   ASSISTIVE DEVICES UTILIZED TO PREVENT FALLS:  Life alert? No  Use of a cane, walker or w/c? No  Grab bars in the bathroom? No  Shower chair or bench in shower? No  Elevated toilet seat or a handicapped toilet? No   TIMED UP AND GO:  Was the test performed? No .  Cognitive Function:     6CIT Screen 08/20/2021  What Year? 0 points  What month? 0 points  What time? 0 points  Count back from 20 0 points  Months in reverse 0 points  Repeat phrase 0 points  Total Score 0    Immunizations Immunization History  Administered Date(s) Administered  Fluad Quad(high Dose 65+) 09/11/2019,  09/03/2020   Hepatitis A, Adult 01/18/2015   Hepatitis B, adult 01/18/2015, 04/18/2015   Influenza, High Dose Seasonal PF 10/07/2018   Influenza,inj,Quad PF,6+ Mos 10/09/2014, 11/29/2015   PFIZER Comirnaty(Gray Top)Covid-19 Tri-Sucrose Vaccine 04/02/2021   PFIZER(Purple Top)SARS-COV-2 Vaccination 01/27/2020, 02/17/2020, 09/16/2020   Pneumococcal Conjugate-13 06/06/2018   Pneumococcal Polysaccharide-23 11/06/2016      Flu Vaccine status: Due, Education has been provided regarding the importance of this vaccine. Advised may receive this vaccine at local pharmacy or Health Dept. Aware to provide a copy of the vaccination record if obtained from local pharmacy or Health Dept. Verbalized acceptance and understanding.  Pneumococcal vaccine status: Due, Education has been provided regarding the importance of this vaccine. Advised may receive this vaccine at local pharmacy or Health Dept. Aware to provide a copy of the vaccination record if obtained from local pharmacy or Health Dept. Verbalized acceptance and understanding.  Covid-19 vaccine status: Completed vaccines  Qualifies for Shingles Vaccine? Yes   Zostavax completed No   Shingrix Completed?: No.    Education has been provided regarding the importance of this vaccine. Patient has been advised to call insurance company to determine out of pocket expense if they have not yet received this vaccine. Advised may also receive vaccine at local pharmacy or Health Dept. Verbalized acceptance and understanding.  Screening Tests Health Maintenance  Topic Date Due   Zoster Vaccines- Shingrix (1 of 2) Never done   INFLUENZA VACCINE  07/21/2021   OPHTHALMOLOGY EXAM  08/10/2021   Fecal DNA (Cologuard)  05/28/2022 (Originally 05/28/2020)   PNA vac Low Risk Adult (2 of 2 - PPSV23) 11/06/2021   HEMOGLOBIN A1C  11/19/2021   FOOT EXAM  05/20/2022   COVID-19 Vaccine  Completed   Hepatitis C Screening  Completed   HPV VACCINES  Aged Out   TETANUS/TDAP   Discontinued    Health Maintenance  Health Maintenance Due  Topic Date Due   Zoster Vaccines- Shingrix (1 of 2) Never done   INFLUENZA VACCINE  07/21/2021   OPHTHALMOLOGY EXAM  08/10/2021    Colorectal cancer screening: Type of screening: Cologuard. Completed 05/28/17. Repeat every 3 years   Additional Screening:  Hepatitis C Screening: Completed 01/18/15  Vision Screening: Recommended annual ophthalmology exams for early detection of glaucoma and other disorders of the eye. Is the patient up to date with their annual eye exam?  Yes  Who is the provider or what is the name of the office in which the patient attends annual eye exams? Pt is looking for new providers If pt is not established with a provider, would they like to be referred to a provider to establish care? Yes .   Dental Screening: Recommended annual dental exams for proper oral hygiene  Community Resource Referral / Chronic Care Management: CRR required this visit?  Yes   CCM required this visit?  No      Plan:     I have personally reviewed and noted the following in the patient's chart:   Medical and social history Use of alcohol, tobacco or illicit drugs  Current medications and supplements including opioid prescriptions. Patient is not currently taking opioid prescriptions. Functional ability and status Nutritional status Physical activity Advanced directives List of other physicians Hospitalizations, surgeries, and ER visits in previous 12 months Vitals Screenings to include cognitive, depression, and falls Referrals and appointments  In addition, I have reviewed and discussed with patient certain preventive protocols, quality metrics, and best practice recommendations.  A written personalized care plan for preventive services as well as general preventive health recommendations were provided to patient.     Marzella Schleinina H Tessica Cupo, LPN   1/47/82958/31/2022   Nurse Notes: none

## 2021-08-22 ENCOUNTER — Telehealth: Payer: Self-pay | Admitting: *Deleted

## 2021-08-22 NOTE — Telephone Encounter (Signed)
   TelephSuccessful.  08/22/2021 Name: Charles [name] MRN: [id number] DOB: 02-07-1951  Charles [name] is a 70 y.o. year old male who is a primary care patient of [place], Timoteo Expose, MD . The community resource team was consulted for assistance with  Contacted pt about e-mail that was sent earlier. He did advise that he receieved everything, he just hasn't looked at it fully yet. I also let pt know that the packets information will also be sent in mail.  Care guide performed the following interventions: Patient provided with information about care guide support team and interviewed to confirm resource needs.  Follow Up Plan:  No further follow up planned at this time. The patient has been provided with needed resources.  Alois Cliche -[facility] Guide , Embedded Care Coordination [facility], Care Management  [phone] [address] [place] , [place] [address] Email : [email or web address]

## 2021-08-22 NOTE — Telephone Encounter (Signed)
   Telephone encounter was:  Successful.  08/22/2021 Name: Charles [name] MRN: [id number] DOB: [birth year]  Charles [name] is a 70 y.o. year old male who is a primary care patient of [place], Timoteo Expose, MD . The community resource team was consulted for assistance with  Pt is on a fixed income and is looking for something affordable. He is also interested in food banks. I will send information to his home address and e-mail: [email or web address]  Care guide performed the following interventions: Patient provided with information about care guide support team and interviewed to confirm resource needs.  Follow Up Plan:  Care guide will follow up with patient by phone over the next next few days.  Alois Cliche -[facility] Guide , Embedded Care Coordination [facility], Care Management  [phone] [address] [place] , [place] [address] Email : [email or web address]

## 2021-09-08 ENCOUNTER — Other Ambulatory Visit: Payer: Self-pay | Admitting: Family Medicine

## 2021-09-08 DIAGNOSIS — E785 Hyperlipidemia, unspecified: Secondary | ICD-10-CM

## 2021-09-19 ENCOUNTER — Ambulatory Visit: Payer: Medicare PPO | Admitting: Family Medicine

## 2021-09-22 ENCOUNTER — Other Ambulatory Visit: Payer: Self-pay

## 2021-09-22 NOTE — Progress Notes (Signed)
HPI: [name] is a 70 y.o. male, who is here today for 4 months follow up.   He was last seen on 05/20/2021.  Diabetes Mellitus II: Dx'ed in 2003. - Checking BG at home: Sometimes BS "is very" low 60's, 150-160's. - Medications: Metformin 500 mg 2 tabs bid and Amaryl 4 mg.  He has chronic diarrhea, not sure if probably has been aggravated by metformin, he does not think so. - Compliance: Good. - Diet: Decreased meat intake, eating mainly chicken and fish, eating "little bit" of rice. [place] sausage in the morning with eggs. Wheat bread. Rice crispy cereal sometimes. - Exercise: Walks to the store 2-3 times per week. DOE, stable. - eye exam: 07/2020.He is looking for a new provider. - foot exam: 04/2021.  - Negative for symptoms of hypoglycemia, polyuria, polydipsia, numbness extremities, foot ulcers/trauma  Lab Results  Component Value Date   HGBA1C 7.7 (A) 05/20/2021   Lab Results  Component Value Date   MICROALBUR 3.6 09/18/2020   Hypertension: Dx'ed in 1996. Medications:Amlodipine 10 mg daily , Metoprolol succinate 50 mg daily,and Avapro 75 mg daily. BP readings at home:He is not checking. Side effects: None  Negative for unusual or severe headache, visual changes, exertional chest pain, worsening dyspnea,  focal weakness, or edema.  Lab Results  Component Value Date   CREATININE 1.25 05/20/2021   BUN 17 05/20/2021   NA 138 05/20/2021   K 3.4 (L) 05/20/2021   CL 102 05/20/2021   CO2 23 05/20/2021   Hyperlipidemia: Currently on Crestor 10 mg daily. Following a low fat diet: He is trying to do better.. Side effects from medication: None  Lab Results  Component Value Date   CHOL 131 09/18/2020   HDL 42 09/18/2020   LDLCALC 73 09/18/2020   TRIG 79 09/18/2020   CHOLHDL 3.1 09/18/2020   PAD: s/p right iliac artery stenting many years ago. Negative for claudication like symptoms.  OSA: He could not sleep during sleep study with CPAP, states that it  "was very uncomfortable" and he does not want to try the CPAP mask again.  Also he is concerned about cost of CPAP supplies. He has seen TV add about another treatment option that does not require a mask.  Review of Systems  Constitutional:  Positive for fatigue. Negative for activity change, appetite change and fever.  HENT:  Negative for nosebleeds, sore throat and trouble swallowing.   Respiratory:  Negative for cough and wheezing.   Gastrointestinal:  Negative for abdominal pain, nausea and vomiting.  Genitourinary:  Negative for decreased urine volume, dysuria and hematuria.  Musculoskeletal:  Positive for arthralgias. Negative for gait problem and myalgias.  Neurological:  Negative for syncope and weakness.  Rest of ROS, see pertinent positives sand negatives in HPI  Current Outpatient Medications on File Prior to Visit  Medication Sig Dispense Refill   Accu-Chek FastClix Lancets MISC Use to test blood sugars two times daily. 200 each 3   albuterol (VENTOLIN HFA) 108 (90 Base) MCG/ACT inhaler Inhale 2 puffs into the lungs every 6 (six) hours as needed for wheezing or shortness of breath. 8 g 1   Alcohol Swabs (B-D SINGLE USE SWABS REGULAR) PADS 1 Device by Does not apply route daily. 90 each 3   allopurinol (ZYLOPRIM) 300 MG tablet TAKE 1 TABLET EVERY DAY 90 tablet 1   amLODipine (NORVASC) 10 MG tablet TAKE 1 TABLET EVERY DAY 90 tablet 1   aspirin 81 MG tablet Take 81  mg by mouth every evening.      glimepiride (AMARYL) 4 MG tablet TAKE 1 TABLET EVERY DAY BEFORE BREAKFAST 90 tablet 0   glucose blood (ACCU-CHEK GUIDE) test strip Use to test 1-2 times daily. 100 each 12   irbesartan (AVAPRO) 75 MG tablet TAKE 1 TABLET EVERY DAY 90 tablet 3   metoprolol succinate (TOPROL-XL) 50 MG 24 hr tablet Take 1 tablet (50 mg total) by mouth daily. Take with or immediately following a meal. 90 tablet 1   rosuvastatin (CRESTOR) 10 MG tablet TAKE 1 TABLET EVERY DAY 90 tablet 0   No current  facility-administered medications on file prior to visit.   Past Medical History:  Diagnosis Date   Abnormal carotid ultrasound 07/2015   mild bilateral stenosis, repeat in 1 year.  Dr. Rennis Golden   Diabetes mellitus without complication (HCC)    Gout    Hepatitis C antibody test positive 05/2015   prior infection, resolved   History of cardiovascular stress test 9/16   Lexiscan, EF 55%, no ischemia, Dr. Rennis Golden   Hyperlipidemia    Hypertension    Noncompliance    Obesity    PVD (peripheral vascular disease) (HCC) 07/2015   normal ABIs, Dr. Rennis Golden   Allergies  Allergen Reactions   Penicillins Other (See Comments)    Per pt blacked out after injection as a child.  Did it involve swelling of the face/tongue/throat, SOB, or low BP? N Did it involve sudden or severe rash/hives, skin peeling, or any reaction on the inside of your mouth or nose? N Did you need to seek medical attention at a hospital or doctor's office? N When did it last happen?   Several Decades Ago    If all above answers are "NO", may proceed with cephalosporin use.     Social History   Socioeconomic History   Marital status: Divorced    Spouse name: Not on file   Number of children: Not on file   Years of education: Not on file   Highest education level: Not on file  Occupational History   Not on file  Tobacco Use   Smoking status: Former    Packs/day: 1.00    Years: 30.00    Pack years: 30.00    Types: Cigarettes    Quit date: 07/31/1995    Years since quitting: 26.1   Smokeless tobacco: Never  Vaping Use   Vaping Use: Never used  Substance and Sexual Activity   Alcohol use: No    Alcohol/week: 0.0 standard drinks   Drug use: No   Sexual activity: Not on file  Other Topics Concern   Not on file  Social History Narrative   Not on file   Social Determinants of Health   Financial Resource Strain: Medium Risk   Difficulty of Paying Living Expenses: Somewhat hard  Food Insecurity: No Food Insecurity    Worried About Programme researcher, broadcasting/film/video in the Last Year: Never true   Ran Out of Food in the Last Year: Never true  Transportation Needs: No Transportation Needs   Lack of Transportation (Medical): No   Lack of Transportation (Non-Medical): No  Physical Activity: Inactive   Days of Exercise per Week: 0 days   Minutes of Exercise per Session: 0 min  Stress: No Stress Concern Present   Feeling of Stress : Only a little  Social Connections: Socially Isolated   Frequency of Communication with Friends and Family: Once a week   Frequency of Social Gatherings  with Friends and Family: Once a week   Attends Religious Services: Never   Database administrator or Organizations: No   Attends Banker Meetings: Never   Marital Status: Divorced   Vitals:   09/23/21 0832 09/23/21 0919  BP: (!) 146/90 140/85  Pulse: (!) 115 100  Resp: 16   SpO2: 97%    Wt Readings from Last 3 Encounters:  09/23/21 262 lb (118.8 kg)  05/20/21 261 lb (118.4 kg)  03/27/21 262 lb (118.8 kg)   Body mass index is 43.6 kg/m.  Physical Exam Nursing note reviewed.  Constitutional:      General: He is not in acute distress.    Appearance: He is well-developed.  HENT:     Head: Normocephalic and atraumatic.  Eyes:     Conjunctiva/sclera: Conjunctivae normal.  Cardiovascular:     Rate and Rhythm: Normal rate and regular rhythm.     Pulses:          Dorsalis pedis pulses are 2+ on the right side and 2+ on the left side.     Heart sounds: No murmur heard.    Comments: Trace pitting LE edema, bilateral. Pulmonary:     Effort: Pulmonary effort is normal. No respiratory distress.     Breath sounds: Normal breath sounds.  Abdominal:     Palpations: Abdomen is soft. There is no hepatomegaly or mass.     Tenderness: There is no abdominal tenderness.  Lymphadenopathy:     Cervical: No cervical adenopathy.  Skin:    General: Skin is warm.     Findings: No erythema or rash.  Neurological:     Mental  Status: He is alert and oriented to person, place, and time.     Cranial Nerves: No cranial nerve deficit.     Gait: Gait normal.  Psychiatric:     Comments: Well groomed, good eye contact.   Diabetic Foot Exam - Simple   Simple Foot Form Diabetic Foot exam was performed with the following findings: Yes 09/23/2021  6:39 PM  Visual Inspection See comments: Yes Sensation Testing Intact to touch and monofilament testing bilaterally: Yes Pulse Check Posterior Tibialis and Dorsalis pulse intact bilaterally: Yes Comments Hypertrophic toenails.    ASSESSMENT AND PLAN:  Mr. MARINUS EICHER was seen today for 4 months follow-up.  Orders Placed This Encounter  Procedures   Comprehensive metabolic panel   Lipid panel   Microalbumin / creatinine urine ratio   POC HgB A1c   Lab Results  Component Value Date   HGBA1C 7.6 (A) 09/23/2021   Lab Results  Component Value Date   MICROALBUR 4.6 (H) 09/23/2021   MICROALBUR 3.6 09/18/2020   Lab Results  Component Value Date   CREATININE 1.16 09/23/2021   BUN 13 09/23/2021   NA 140 09/23/2021   K 3.1 (L) 09/23/2021   CL 102 09/23/2021   CO2 27 09/23/2021   Lab Results  Component Value Date   ALT 18 09/23/2021   AST 20 09/23/2021   ALKPHOS 76 09/23/2021   BILITOT 0.4 09/23/2021   Lab Results  Component Value Date   CHOL 124 09/23/2021   HDL 39.10 09/23/2021   LDLCALC 70 09/23/2021   TRIG 74.0 09/23/2021   CHOLHDL 3 09/23/2021    OSA (obstructive sleep apnea) Per pt report, he has sleep study with CPAP but study could not be completed because he could not sleep with mask on. We discussed physiopathology of the disease and possible complications  if not well treated. He cannot afford CPAP at this time and he is not certain he can wear the mask. Continue working on weight loss, which will help.  PAD (peripheral artery disease) (HCC) Continue Crestor 10 mg daily. LDL goal < 70.  Type 2 diabetes mellitus with vascular  disease (HCC) HgA1C at goal. Actos 15 mg added today, we discussed some side effects. Metformin could be contributing to diarrhea, so decreased it from 1000 mg bid to 500 mg bid with meals. No changes in Glimepiride but will plan on changing to Glipizide. Regular exercise and healthy diet with avoidance of added sugar food intake is an important part of treatment and recommended. Annual eye exam, periodic dental and foot care recommended. F/U in 4 months   Hyperlipidemia Continue Crestor 10 mg daily. Low fat diet also recommended. Further recommendations according to FLP result.  Essential hypertension Re-checked BP 140/85. No changes in Amlodipine,Avapro,or Metoprolol succinate dose. Recommend taking Amlodipine 10 mg at bedtime. Monitor BP at home. He is due for his eye exam.  Diarrhea This is a chronic problem. We discussed possible etiologies. ?  IBS-D Metformin dose decreased, he will let me know if he notices any change in number of stools per day. Adequate hydration. Last colonoscopy in 11/2014.   I spent a total of 45 minutes in both face to face and non face to face activities for this visit on the date of this encounter. During this time history was obtained and documented, examination was performed, prior labs reviewed, and assessment/plan discussed.  Return in about 4 months (around 01/24/2022) for DM,HTN.  Daelon Dunivan G. [place], MD  [facility]. [place] office.

## 2021-09-23 ENCOUNTER — Encounter: Payer: Self-pay | Admitting: Family Medicine

## 2021-09-23 ENCOUNTER — Ambulatory Visit: Payer: Medicare PPO | Admitting: Family Medicine

## 2021-09-23 VITALS — BP 140/85 | HR 100 | Resp 16 | Ht 65.0 in | Wt 262.0 lb

## 2021-09-23 DIAGNOSIS — I739 Peripheral vascular disease, unspecified: Secondary | ICD-10-CM | POA: Diagnosis not present

## 2021-09-23 DIAGNOSIS — E876 Hypokalemia: Secondary | ICD-10-CM

## 2021-09-23 DIAGNOSIS — G4733 Obstructive sleep apnea (adult) (pediatric): Secondary | ICD-10-CM

## 2021-09-23 DIAGNOSIS — E785 Hyperlipidemia, unspecified: Secondary | ICD-10-CM | POA: Diagnosis not present

## 2021-09-23 DIAGNOSIS — I10 Essential (primary) hypertension: Secondary | ICD-10-CM

## 2021-09-23 DIAGNOSIS — R197 Diarrhea, unspecified: Secondary | ICD-10-CM | POA: Diagnosis not present

## 2021-09-23 DIAGNOSIS — E1159 Type 2 diabetes mellitus with other circulatory complications: Secondary | ICD-10-CM | POA: Diagnosis not present

## 2021-09-23 LAB — COMPREHENSIVE METABOLIC PANEL
ALT: 18 U/L (ref 0–53)
AST: 20 U/L (ref 0–37)
Albumin: 4.2 g/dL (ref 3.5–5.2)
Alkaline Phosphatase: 76 U/L (ref 39–117)
BUN: 13 mg/dL (ref 6–23)
CO2: 27 mEq/L (ref 19–32)
Calcium: 9.5 mg/dL (ref 8.4–10.5)
Chloride: 102 mEq/L (ref 96–112)
Creatinine, Ser: 1.16 mg/dL (ref 0.40–1.50)
GFR: 64.16 mL/min (ref >60.00)
Glucose, Bld: 142 mg/dL — ABNORMAL HIGH (ref 70–99)
Potassium: 3.1 mEq/L — ABNORMAL LOW (ref 3.5–5.1)
Sodium: 140 mEq/L (ref 135–145)
Total Bilirubin: 0.4 mg/dL (ref 0.2–1.2)
Total Protein: 7.8 g/dL (ref 6.0–8.3)

## 2021-09-23 LAB — LIPID PANEL
Cholesterol: 124 mg/dL (ref 0–200)
HDL: 39.1 mg/dL (ref >39.00)
LDL Cholesterol: 70 mg/dL (ref 0–99)
NonHDL: 84.98
Total CHOL/HDL Ratio: 3
Triglycerides: 74 mg/dL (ref 0.0–149.0)
VLDL: 14.8 mg/dL (ref 0.0–40.0)

## 2021-09-23 LAB — POCT GLYCOSYLATED HEMOGLOBIN (HGB A1C): Hemoglobin A1C: 7.6 % — AB (ref 4.0–5.6)

## 2021-09-23 LAB — MICROALBUMIN / CREATININE URINE RATIO
Creatinine,U: 233.3 mg/dL
Microalb Creat Ratio: 2 mg/g (ref 0.0–30.0)
Microalb, Ur: 4.6 mg/dL — ABNORMAL HIGH (ref 0.0–1.9)

## 2021-09-23 MED ORDER — PIOGLITAZONE HCL 15 MG PO TABS: 15.0000 mg | ORAL_TABLET | Freq: Every day | ORAL | 3 refills | Status: DC

## 2021-09-23 MED ORDER — METFORMIN HCL 500 MG PO TABS: 500.0000 mg | ORAL_TABLET | Freq: Two times a day (BID) | ORAL | 0 refills | Status: DC

## 2021-09-23 NOTE — Patient Instructions (Addendum)
A few things to remember from today's visit:  Type 2 diabetes mellitus with vascular disease (HCC) - Plan: POC HgB A1c, Microalbumin / creatinine urine ratio  Essential hypertension - Plan: Comprehensive metabolic panel  Hyperlipidemia, unspecified hyperlipidemia type - Plan: Lipid panel  If you need refills please call your pharmacy. Do not use My Chart to request refills or for acute issues that need immediate attention.   We are going to try decreasing dose of Metformin for a few weeks and see if diarrhea gets better.Take Metformin with breakfast and supper. We are going to add Actos 15 mg daily. When your glimepiride is almost gone we are going to change it to Glipizide. This medication is to take right before breakfast.  Continue Avapro 75 mg in the morning and take Amlodipine 10 mg at bedtime. No changes in Metoprolol succinate.  Please be sure medication list is accurate. If a new problem present, please set up appointment sooner than planned today.

## 2021-09-23 NOTE — Assessment & Plan Note (Signed)
Re-checked BP 140/85. No changes in Amlodipine,Avapro,or Metoprolol succinate dose. Recommend taking Amlodipine 10 mg at bedtime. Monitor BP at home. He is due for his eye exam.

## 2021-09-23 NOTE — Assessment & Plan Note (Signed)
Continue Crestor 10 mg daily. LDL goal < 70.

## 2021-09-23 NOTE — Assessment & Plan Note (Signed)
HgA1C at goal. Actos 15 mg added today, we discussed some side effects. Metformin could be contributing to diarrhea, so decreased it from 1000 mg bid to 500 mg bid with meals. No changes in Glimepiride but will plan on changing to Glipizide. Regular exercise and healthy diet with avoidance of added sugar food intake is an important part of treatment and recommended. Annual eye exam, periodic dental and foot care recommended. F/U in 4 months

## 2021-09-23 NOTE — Assessment & Plan Note (Signed)
This is a chronic problem. We discussed possible etiologies. ?  IBS-D Metformin dose decreased, he will let me know if he notices any change in number of stools per day. Adequate hydration. Last colonoscopy in 11/2014.

## 2021-09-23 NOTE — Assessment & Plan Note (Signed)
Continue Crestor 10 mg daily. Low fat diet also recommended. Further recommendations according to FLP result.

## 2021-09-28 MED ORDER — POTASSIUM CHLORIDE CRYS ER 20 MEQ PO TBCR: 20.0000 meq | EXTENDED_RELEASE_TABLET | Freq: Every day | ORAL | 3 refills | Status: DC

## 2021-10-08 ENCOUNTER — Other Ambulatory Visit: Payer: Self-pay | Admitting: Family Medicine

## 2021-10-08 DIAGNOSIS — I10 Essential (primary) hypertension: Secondary | ICD-10-CM

## 2021-11-17 ENCOUNTER — Other Ambulatory Visit: Payer: Self-pay | Admitting: Family Medicine

## 2021-11-17 DIAGNOSIS — E118 Type 2 diabetes mellitus with unspecified complications: Secondary | ICD-10-CM

## 2021-11-24 ENCOUNTER — Other Ambulatory Visit: Payer: Self-pay | Admitting: Family Medicine

## 2021-12-01 ENCOUNTER — Emergency Department (HOSPITAL_COMMUNITY)
Admission: EM | Admit: 2021-12-01 | Discharge: 2021-12-02 | Disposition: A | Discharge status: Home / Self Care | Payer: Medicare PPO | Attending: Emergency Medicine | Admitting: Emergency Medicine

## 2021-12-01 ENCOUNTER — Emergency Department (HOSPITAL_COMMUNITY): Payer: Medicare PPO

## 2021-12-01 ENCOUNTER — Other Ambulatory Visit: Payer: Self-pay | Admitting: Family Medicine

## 2021-12-01 ENCOUNTER — Other Ambulatory Visit: Payer: Self-pay

## 2021-12-01 ENCOUNTER — Encounter (HOSPITAL_COMMUNITY): Payer: Self-pay

## 2021-12-01 DIAGNOSIS — E119 Type 2 diabetes mellitus without complications: Secondary | ICD-10-CM | POA: Diagnosis not present

## 2021-12-01 DIAGNOSIS — R0600 Dyspnea, unspecified: Secondary | ICD-10-CM | POA: Diagnosis not present

## 2021-12-01 DIAGNOSIS — R0789 Other chest pain: Secondary | ICD-10-CM | POA: Diagnosis not present

## 2021-12-01 DIAGNOSIS — Z7984 Long term (current) use of oral hypoglycemic drugs: Secondary | ICD-10-CM | POA: Diagnosis not present

## 2021-12-01 DIAGNOSIS — R202 Paresthesia of skin: Secondary | ICD-10-CM | POA: Diagnosis not present

## 2021-12-01 DIAGNOSIS — Z7982 Long term (current) use of aspirin: Secondary | ICD-10-CM | POA: Insufficient documentation

## 2021-12-01 DIAGNOSIS — I10 Essential (primary) hypertension: Secondary | ICD-10-CM | POA: Insufficient documentation

## 2021-12-01 DIAGNOSIS — Z79899 Other long term (current) drug therapy: Secondary | ICD-10-CM | POA: Diagnosis not present

## 2021-12-01 DIAGNOSIS — R0602 Shortness of breath: Secondary | ICD-10-CM | POA: Diagnosis not present

## 2021-12-01 DIAGNOSIS — Z87891 Personal history of nicotine dependence: Secondary | ICD-10-CM | POA: Insufficient documentation

## 2021-12-01 DIAGNOSIS — R079 Chest pain, unspecified: Secondary | ICD-10-CM | POA: Diagnosis not present

## 2021-12-01 DIAGNOSIS — R42 Dizziness and giddiness: Secondary | ICD-10-CM | POA: Insufficient documentation

## 2021-12-01 DIAGNOSIS — E876 Hypokalemia: Secondary | ICD-10-CM

## 2021-12-01 LAB — CBC
HCT: 45.6 % (ref 39.0–52.0)
Hemoglobin: 13.5 g/dL (ref 13.0–17.0)
MCH: 24.2 pg — ABNORMAL LOW (ref 26.0–34.0)
MCHC: 29.6 g/dL — ABNORMAL LOW (ref 30.0–36.0)
MCV: 81.9 fL (ref 80.0–100.0)
Platelets: 210 10*3/uL (ref 150–400)
RBC: 5.57 MIL/uL (ref 4.22–5.81)
RDW: 17.3 % — ABNORMAL HIGH (ref 11.5–15.5)
WBC: 9 10*3/uL (ref 4.0–10.5)
nRBC: 0 % (ref 0.0–0.2)

## 2021-12-01 LAB — BASIC METABOLIC PANEL
Anion gap: 10 (ref 5–15)
BUN: 12 mg/dL (ref 8–23)
CO2: 20 mmol/L — ABNORMAL LOW (ref 22–32)
Calcium: 8.8 mg/dL — ABNORMAL LOW (ref 8.9–10.3)
Chloride: 109 mmol/L (ref 98–111)
Creatinine, Ser: 1.02 mg/dL (ref 0.61–1.24)
GFR, Estimated: >60 mL/min (ref >60)
Glucose, Bld: 93 mg/dL (ref 70–99)
Potassium: 4.9 mmol/L (ref 3.5–5.1)
Sodium: 139 mmol/L (ref 135–145)

## 2021-12-01 LAB — TROPONIN I (HIGH SENSITIVITY)
Troponin I (High Sensitivity): 7 ng/L (ref <18)
Troponin I (High Sensitivity): 10 ng/L (ref <18)

## 2021-12-01 LAB — CBG MONITORING, ED: Glucose-Capillary: 76 mg/dL (ref 70–99)

## 2021-12-01 MED ORDER — ACETAMINOPHEN 325 MG PO TABS
650.0000 mg | ORAL_TABLET | Freq: Once | ORAL | Status: AC
  Administered 2021-12-01: 650 mg via ORAL
  Filled 2021-12-01: qty 2

## 2021-12-01 MED ORDER — LIDOCAINE 5 % EX PTCH
2.0000 | MEDICATED_PATCH | CUTANEOUS | Status: DC
  Administered 2021-12-01: 2 via TRANSDERMAL
  Filled 2021-12-01: qty 2

## 2021-12-01 MED ORDER — LIDOCAINE 5 % EX PTCH: 1.0000 | MEDICATED_PATCH | Freq: Every day | CUTANEOUS | 0 refills | Status: DC | PRN

## 2021-12-01 NOTE — ED Provider Notes (Signed)
Emergency Medicine Provider Triage Evaluation Note  KALID GHAN , a 70 y.o. male  was evaluated in triage.  Pt complains of left sided nonradiating chest pain. Denies diaphoresis. Onset 1030AM. Endorses lightheadedness, dry mouth, and panick sensation. Shortness of breath but states that's chronic.  Review of Systems  Positive: Chest pain, numbness in left arm Negative: Back pain, diaphoresis, shortness of breath, PND, peripheral edema, orthopnea.  Physical Exam  BP (!) 179/95 (BP Location: Right Arm)   Pulse 97   Temp 98.1 F (36.7 C) (Oral)   Resp 20   Ht 5\' 5"  (1.651 m)   Wt 118.4 kg   SpO2 98%   BMI 43.43 kg/m  Gen:   Awake, no distress   Resp:  Normal effort  MSK:   Moves extremities without difficulty. TTP on left side of chest Other:  Blood pressure symmetrical on both arms.   Medical Decision Making  Medically screening exam initiated at 2:20 PM.  Appropriate orders placed.  ARLEN DUPUIS was informed that the remainder of the evaluation will be completed by another provider, this initial triage assessment does not replace that evaluation, and the importance of remaining in the ED until their evaluation is complete.     [name], PA-C 12/01/21 1424    14/12/22, MD 12/02/21 1655

## 2021-12-01 NOTE — Discharge Instructions (Addendum)
You were seen in the emergency department today for chest pain. Your work-up in the emergency department has been overall reassuring. Your labs have been fairly normal and or similar to previous blood work you have had done. Your EKG and the enzyme we use to check your heart did not show an acute heart attack at this time. Your chest x-ray was normal.   We are sending you home with lidoderm patches- apply 1 patch to your area of most significant pain once per day, remove and discard patches within 12 hours. Take tylenol per over the counter dosing.   We have prescribed you new medication(s) today. Discuss the medications prescribed today with your pharmacist as they can have adverse effects and interactions with your other medicines including over the counter and prescribed medications. Seek medical evaluation if you start to experience new or abnormal symptoms after taking one of these medicines, seek care immediately if you start to experience difficulty breathing, feeling of your throat closing, facial swelling, or rash as these could be indications of a more serious allergic reaction  We would like you to follow up closely with your primary care provider and/or the cardiologist provided in your discharge instructions within 1-3 days. Return to the ER immediately should you experience any new or worsening symptoms including but not limited to return of pain, worsened pain, vomiting, shortness of breath, dizziness, lightheadedness, passing out, or any other concerns that you may have.

## 2021-12-01 NOTE — ED Triage Notes (Signed)
Patient c/o left chest pain and SOB that started 2 hours ago.

## 2021-12-01 NOTE — ED Notes (Signed)
Pt provided with turkey sandwich.

## 2021-12-01 NOTE — ED Provider Notes (Signed)
[facility]-EMERGENCY DEPT Provider Note   CSN: [id number] Arrival date & time: 12/01/21  1355     History Chief Complaint  Patient presents with   Chest Pain   Shortness of Breath    Charles [name] is a 69 y.o. male with a hx of diabetes mellitus, hypertension, hyperlipidemia, asthma, & GERD who presents to the ED with complaints of chest pain that began @ 10:30AM. Patient states pain is in variable locations, currently to the left chest, feels sharp in nature, occurring intermittently lasting a couple minutes at a time, worse with certain arm movements, but can occur without trigger, no specific alleviating factors. This AM was at rest when pain began, he subsequently laid down and when he woke up from his nap, was laying on his left side, his LUE felt numb/tingling, this resolved and has not re-occurred. He has had some mild dyspnea and lightheadedness with his sxs. Denies syncope, nausea, vomiting, leg pain/swelling, hemoptysis, recent surgery/trauma, recent long travel, hormone use, personal hx of cancer, or hx of DVT/PE.   HPI     Past Medical History:  Diagnosis Date   Abnormal carotid ultrasound 07/2015   mild bilateral stenosis, repeat in 1 year.  Dr. Rennis Golden   Diabetes mellitus without complication (HCC)    Gout    Hepatitis C antibody test positive 05/2015   prior infection, resolved   History of cardiovascular stress test 9/16   Lexiscan, EF 55%, no ischemia, Dr. Rennis Golden   Hyperlipidemia    Hypertension    Noncompliance    Obesity    PVD (peripheral vascular disease) (HCC) 07/2015   normal ABIs, Dr. Rennis Golden    Patient Active Problem List   Diagnosis Date Noted   Diarrhea 09/23/2021   Morbid obesity (HCC) 09/09/2018   Chest pain 05/18/2018   GERD (gastroesophageal reflux disease) 12/28/2017   Chronic lower back pain 05/07/2017   Excessive daytime sleepiness 03/11/2017   Noncompliance 02/27/2016   Chronic neck pain 02/27/2016   AP (abdominal  pain) 02/27/2016   Cervical radiculitis 02/27/2016   Left hand paresthesia 02/27/2016   Diabetes mellitus with complication (HCC) 11/29/2015   Knee pain, acute 11/29/2015   Need for prophylactic vaccination and inoculation against influenza 11/29/2015   Cervical pain (neck) 11/29/2015   Weight gain 11/29/2015   Primary gout 11/29/2015   PAD (peripheral artery disease) (HCC) 07/31/2015   Shortness of breath 07/31/2015   Asthma 07/31/2015   Morbid obesity with BMI of 40.0-44.9, adult (HCC) 06/10/2015   Hyperlipidemia 06/10/2015   Type 2 diabetes mellitus with vascular disease (HCC) 10/23/2014   Atherosclerosis of aorta (HCC) 10/23/2014   Acute idiopathic gout of multiple sites 10/23/2014   Arcus senilis of both eyes 10/23/2014   Diabetes type 2, uncontrolled 10/09/2014   Essential hypertension 10/09/2014   Pain in joint, ankle and foot 10/09/2014    Past Surgical History:  Procedure Laterality Date   BALLOON ANGIOPLASTY, ARTERY N/A also placed stent   COLONOSCOPY  2016   Dr. Elnoria Howard       Family History  Problem Relation Age of Onset   Asthma Mother    Heart attack Mother    Hypertension Mother    Hyperlipidemia Mother    Hypertension Father    Diabetes Father    HIV Sister    Liver disease Brother    Lung cancer Brother    Hypertension Brother     Social History   Tobacco Use   Smoking status: Former  Packs/day: 1.00    Years: 30.00    Pack years: 30.00    Types: Cigarettes    Quit date: 07/31/1995    Years since quitting: 26.3   Smokeless tobacco: Never  Vaping Use   Vaping Use: Never used  Substance Use Topics   Alcohol use: No    Alcohol/week: 0.0 standard drinks   Drug use: No    Home Medications Prior to Admission medications   Medication Sig Start Date End Date Taking? Authorizing Provider  Accu-Chek FastClix Lancets MISC USE TO TEST BLOOD SUGARS TWO TIMES DAILY. 11/17/21   [place], [name], MD  albuterol (VENTOLIN HFA) 108 (90 Base) MCG/ACT  inhaler Inhale 2 puffs into the lungs every 6 (six) hours as needed for wheezing or shortness of breath. 11/12/20   [place], [name], MD  Alcohol Swabs (B-D SINGLE USE SWABS REGULAR) PADS 1 Device by Does not apply route daily. 06/24/17   Romero Belling, MD  allopurinol (ZYLOPRIM) 300 MG tablet TAKE 1 TABLET EVERY DAY 05/20/21   [place], [name], MD  amLODipine (NORVASC) 10 MG tablet TAKE 1 TABLET EVERY DAY 05/20/21   [place], [name], MD  aspirin 81 MG tablet Take 81 mg by mouth every evening.     [provider]  glimepiride (AMARYL) 4 MG tablet TAKE 1 TABLET EVERY DAY BEFORE BREAKFAST 09/08/21   [place], [name], MD  glucose blood (ACCU-CHEK GUIDE) test strip Use to test 1-2 times daily. 11/26/20   [place], [name], MD  irbesartan (AVAPRO) 75 MG tablet TAKE 1 TABLET EVERY DAY 08/04/21   [place], [name], MD  metFORMIN (GLUCOPHAGE) 500 MG tablet Take 1 tablet (500 mg total) by mouth 2 (two) times daily with a meal. 09/23/21   [place], [name], MD  metoprolol succinate (TOPROL-XL) 50 MG 24 hr tablet TAKE 1 TABLET EVERY DAY . TAKE WITH OR IMMEDIATELY FOLLOWING A MEAL. (DOSE INCREASE) 10/08/21   [place], [name], MD  pioglitazone (ACTOS) 15 MG tablet TAKE 1 TABLET EVERY DAY 11/24/21   [place], [name], MD  potassium chloride SA (KLOR-CON M) 20 MEQ tablet TAKE 1 TABLET EVERY DAY 12/01/21   [place], [name], MD  rosuvastatin (CRESTOR) 10 MG tablet TAKE 1 TABLET EVERY DAY 09/08/21   [place], [name], MD    Allergies    Penicillins  Review of Systems   Review of Systems  Constitutional:  Negative for chills, diaphoresis and fever.  Respiratory:  Positive for shortness of breath.   Cardiovascular:  Positive for chest pain. Negative for leg swelling.  Gastrointestinal:  Negative for abdominal pain, blood in stool, nausea and vomiting.  Neurological:  Positive for light-headedness and numbness (resolved). Negative for syncope.  All other systems reviewed and are negative.  Physical Exam Updated Vital  Signs BP (!) 162/87 (BP Location: Left Arm)   Pulse 81   Temp 98.4 F (36.9 C) (Oral)   Resp 16   Ht 5\' 5"  (1.651 m)   Wt 118.4 kg   SpO2 97%   BMI 43.43 kg/m   Physical Exam Vitals and nursing note reviewed.  Constitutional:      General: He is not in acute distress.    Appearance: He is well-developed. He is not toxic-appearing.  HENT:     Head: Normocephalic and atraumatic.  Eyes:     General:        Right eye: No discharge.        Left eye: No discharge.  Conjunctiva/sclera: Conjunctivae normal.  Cardiovascular:     Rate and Rhythm: Normal rate and regular rhythm.     Pulses:          Radial pulses are 2+ on the right side and 2+ on the left side.  Pulmonary:     Effort: Pulmonary effort is normal. No respiratory distress.     Breath sounds: Normal breath sounds. No wheezing, rhonchi or rales.  Chest:     Chest wall: Tenderness (left anterior chest wall) present.  Abdominal:     General: There is no distension.     Palpations: Abdomen is soft.     Tenderness: There is no abdominal tenderness.  Musculoskeletal:     Cervical back: Neck supple.     Right lower leg: No tenderness. No edema.     Left lower leg: No tenderness. No edema.  Skin:    General: Skin is warm and dry.     Findings: No rash.  Neurological:     Mental Status: He is alert.     Comments: Clear speech.   Psychiatric:        Behavior: Behavior normal.    ED Results / Procedures / Treatments   Labs (all labs ordered are listed, but only abnormal results are displayed) Labs Reviewed  BASIC METABOLIC PANEL - Abnormal; Notable for the following components:      Result Value   CO2 20 (*)    Calcium 8.8 (*)    All other components within normal limits  CBC - Abnormal; Notable for the following components:   MCH 24.2 (*)    MCHC 29.6 (*)    RDW 17.3 (*)    All other components within normal limits  CBG MONITORING, ED  TROPONIN I (HIGH SENSITIVITY)  TROPONIN I (HIGH SENSITIVITY)     EKG None  Radiology DG Chest 2 View  Result Date: 12/01/2021 CLINICAL DATA:  Left-sided chest pain and shortness of breath. EXAM: CHEST - 2 VIEW COMPARISON:  CT a chest in chest x-ray dated May 17, 2020. FINDINGS: The heart size and mediastinal contours are within normal limits. Both lungs are clear. The visualized skeletal structures are unremarkable. IMPRESSION: No active cardiopulmonary disease. Electronically Signed   By: Obie Dredge M.D.   On: 12/01/2021 14:19    Procedures Procedures   Medications Ordered in ED Medications - No data to display  ED Course  I have reviewed the triage vital signs and the nursing notes.  Pertinent labs & imaging results that were available during my care of the patient were reviewed by me and considered in my medical decision making (see chart for details).    MDM Rules/Calculators/A&P                           Patient presents to the ED with complaints of chest pain. Nontoxic, BP mildly elevated.   Additional history obtained:  Additional history obtained from chart review & nursing note review.  05/2018 Myo perfusion stress:  The left ventricular ejection fraction is normal (55-65%). Nuclear stress EF: 56%. There is a small defect of mild severity present in the apex location. The defect is reversible and could represent a very small area of ischemia vs. Diaphragmatic and soft tissue attenuation artifact. This is a low risk study. There was no ST segment deviation noted during stress.    EKG: No STEMI.   Lab Tests:  I Ordered, reviewed, and interpreted labs, which included:  CBC: fairly unremarkable.  BMP: mildly low bicarb/Ca, no significant electrolyte derangement.  Troponins: No significant elevation.   Imaging Studies ordered:  CXR ordered in triage, I independently reviewed, formal radiology impression shows: No active cardiopulmonary disease.  ED Course:  Patient with improvement in sxs S/p tylenol & lidoderm  patches.  Heart pathway score is 4, does have cardiac risk factors, however pain is not exertional, sharp, brief in nature- low suspicion for ACS. Low risk wells- doubt PE. No widened mediastinum on CXR, symmetric pulses- doubt dissection. No pneumonia or pneumothorax on CXR. Labs reassuring. Unclear definitive etiology, with reproducibility with chest wall palpation and improvement with above will tx as MSK pain with close PCP follow up, has also seen cardiology previously. I discussed results, treatment plan, need for follow-up, and return precautions with the patient. Provided opportunity for questions, patient confirmed understanding and is in agreement with plan.    Findings and plan of care discussed with supervising physician Dr. Audley Hose who is in agreement.   Portions of this note were generated with Scientist, clinical (histocompatibility and immunogenetics). Dictation errors may occur despite best attempts at proofreading.  Final Clinical Impression(s) / ED Diagnoses Final diagnoses:  Chest pain, unspecified type    Rx / DC Orders ED Discharge Orders          Ordered    lidocaine (LIDODERM) 5 %  Daily PRN        12/01/21 2352             Cherly Anderson, PA-C 12/02/21 0125    Cheryll Cockayne, MD 12/07/21 2306

## 2021-12-01 NOTE — ED Notes (Signed)
Attempted to call Nephew Shawn, no answer, left voicemail.

## 2021-12-01 NOTE — ED Notes (Signed)
Attempted IV, was able to obtain blood work for 2nd trop.

## 2021-12-01 NOTE — ED Notes (Signed)
Pt ambulated to the restroom, indepent.

## 2021-12-02 NOTE — ED Notes (Signed)
D/C instructions reviewed with pt. Pt verbalized understanding. All questions answered.

## 2021-12-10 ENCOUNTER — Other Ambulatory Visit: Payer: Self-pay | Admitting: Family Medicine

## 2022-01-21 ENCOUNTER — Other Ambulatory Visit: Payer: Self-pay | Admitting: Family Medicine

## 2022-03-04 ENCOUNTER — Other Ambulatory Visit: Payer: Self-pay | Admitting: Family Medicine

## 2022-03-04 DIAGNOSIS — E785 Hyperlipidemia, unspecified: Secondary | ICD-10-CM

## 2022-03-26 DIAGNOSIS — H02831 Dermatochalasis of right upper eyelid: Secondary | ICD-10-CM | POA: Diagnosis not present

## 2022-03-26 DIAGNOSIS — H25041 Posterior subcapsular polar age-related cataract, right eye: Secondary | ICD-10-CM | POA: Diagnosis not present

## 2022-03-26 DIAGNOSIS — E119 Type 2 diabetes mellitus without complications: Secondary | ICD-10-CM | POA: Diagnosis not present

## 2022-03-26 DIAGNOSIS — H52203 Unspecified astigmatism, bilateral: Secondary | ICD-10-CM | POA: Diagnosis not present

## 2022-03-26 LAB — HM DIABETES EYE EXAM

## 2022-03-30 ENCOUNTER — Encounter: Payer: Self-pay | Admitting: Family Medicine

## 2022-04-03 ENCOUNTER — Ambulatory Visit: Payer: Medicare PPO | Admitting: Family Medicine

## 2022-05-01 NOTE — Progress Notes (Signed)
? ?HPI: ?Charles [name] is a 71 y.o. male, who is here today for chronic disease management. ? ?Last seen on 09/23/21. ? ?Diabetes Mellitus II: Dx;ed in 2003. ?- Checking BG at home: Occasionally checking, readings are "good" when he avoids sweets. ?- Medications: Metformin 500 mg 2 tabs bid,Glimepiride 4 mg daily (taking it 1-2 hours before breakfast). Last visit Actos 15 mg was started. He would like to stop Metformin because diarrhea.We have discussed side effects during prior visits, he did not feel at that time medication was contributing to his chronic diarrhea. Other treatments have been recommended in the past but not covered by his health insurance. ?- Diet: He has not been consistent, hungry "all the time" and craving sweets. ?- Exercise: Not consistently. ?- eye exam: 03/2022. ?- foot exam: 09/2021. ?- Negative for symptoms of hypoglycemia, polyuria, polydipsia, numbness extremities, foot ulcers/trauma ? ?Lab Results  ?Component Value Date  ? HGBA1C 7.6 (A) 09/23/2021  ? ?Lab Results  ?Component Value Date  ? MICROALBUR 4.6 (H) 09/23/2021  ? ?Hypertension: Dx'ed in 1996. ?Medications:Amlodipine 10 mg daily,Avapro 75 mg daily,and Metoprolol succinate 50 mg daily. ?BP readings at home:He is not checking. ?Side effects:None. ?Negative for unusual or severe headache, visual changes, exertional chest pain, dyspnea,  focal weakness, or edema. ? ?Lab Results  ?Component Value Date  ? CREATININE 1.02 12/01/2021  ? BUN 12 12/01/2021  ? NA 139 12/01/2021  ? K 4.9 12/01/2021  ? CL 109 12/01/2021  ? CO2 20 (L) 12/01/2021  ? ?Review of Systems  ?Constitutional:  Negative for activity change, appetite change and fever.  ?HENT:  Negative for nosebleeds and sore throat.   ?Respiratory:  Negative for cough and wheezing.   ?Gastrointestinal:  Negative for abdominal pain, nausea and vomiting.  ?     No changes in bowel habits.  ?Genitourinary:  Negative for decreased urine volume and hematuria.  ?Skin:  Negative for  rash.  ?Neurological:  Negative for syncope, facial asymmetry and weakness.  ?Rest of ROS see pertinent positives and negatives in HPI. ? ?Current Outpatient Medications on File Prior to Visit  ?Medication Sig Dispense Refill  ? Accu-Chek FastClix Lancets MISC USE TO TEST BLOOD SUGARS TWO TIMES DAILY. 204 each 2  ? albuterol (VENTOLIN HFA) 108 (90 Base) MCG/ACT inhaler Inhale 2 puffs into the lungs every 6 (six) hours as needed for wheezing or shortness of breath. 8 g 1  ? Alcohol Swabs (B-D SINGLE USE SWABS REGULAR) PADS 1 Device by Does not apply route daily. 90 each 3  ? allopurinol (ZYLOPRIM) 300 MG tablet TAKE 1 TABLET EVERY DAY 90 tablet 1  ? amLODipine (NORVASC) 10 MG tablet TAKE 1 TABLET EVERY DAY 90 tablet 1  ? aspirin 81 MG tablet Take 81 mg by mouth every evening.     ? glucose blood (ACCU-CHEK GUIDE) test strip TEST BLOOD SUGAR 1-2 TIMES DAILY 200 strip 1  ? irbesartan (AVAPRO) 75 MG tablet TAKE 1 TABLET EVERY DAY 90 tablet 3  ? metoprolol succinate (TOPROL-XL) 50 MG 24 hr tablet TAKE 1 TABLET EVERY DAY . TAKE WITH OR IMMEDIATELY FOLLOWING A MEAL. (DOSE INCREASE) 90 tablet 1  ? potassium chloride SA (KLOR-CON M) 20 MEQ tablet TAKE 1 TABLET EVERY DAY 90 tablet 2  ? rosuvastatin (CRESTOR) 10 MG tablet TAKE 1 TABLET EVERY DAY 90 tablet 0  ? ?No current facility-administered medications on file prior to visit.  ? ?Past Medical History:  ?Diagnosis Date  ? Abnormal carotid ultrasound 07/2015  ?  mild bilateral stenosis, repeat in 1 year.  Dr. Rennis Golden  ? Diabetes mellitus without complication (HCC)   ? Gout   ? Hepatitis C antibody test positive 05/2015  ? prior infection, resolved  ? History of cardiovascular stress test 9/16  ? Lexiscan, EF 55%, no ischemia, Dr. Rennis Golden  ? Hyperlipidemia   ? Hypertension   ? Noncompliance   ? Obesity   ? PVD (peripheral vascular disease) (HCC) 07/2015  ? normal ABIs, Dr. Rennis Golden  ? ?Allergies  ?Allergen Reactions  ? Penicillins Other (See Comments)  ?  Per pt blacked out after  injection as a child.  ?Did it involve swelling of the face/tongue/throat, SOB, or low BP? N ?Did it involve sudden or severe rash/hives, skin peeling, or any reaction on the inside of your mouth or nose? N ?Did you need to seek medical attention at a hospital or doctor's office? N ?When did it last happen?   Several Decades Ago    ?If all above answers are ?NO?, may proceed with cephalosporin use. ?  ? ?Social History  ? ?Socioeconomic History  ? Marital status: Divorced  ?  Spouse name: Not on file  ? Number of children: Not on file  ? Years of education: Not on file  ? Highest education level: Not on file  ?Occupational History  ? Not on file  ?Tobacco Use  ? Smoking status: Former  ?  Packs/day: 1.00  ?  Years: 30.00  ?  Pack years: 30.00  ?  Types: Cigarettes  ?  Quit date: 07/31/1995  ?  Years since quitting: 26.7  ? Smokeless tobacco: Never  ?Vaping Use  ? Vaping Use: Never used  ?Substance and Sexual Activity  ? Alcohol use: No  ?  Alcohol/week: 0.0 standard drinks  ? Drug use: No  ? Sexual activity: Not on file  ?Other Topics Concern  ? Not on file  ?Social History Narrative  ? Not on file  ? ?Social Determinants of Health  ? ?Financial Resource Strain: Medium Risk  ? Difficulty of Paying Living Expenses: Somewhat hard  ?Food Insecurity: No Food Insecurity  ? Worried About Programme researcher, broadcasting/film/video in the Last Year: Never true  ? Ran Out of Food in the Last Year: Never true  ?Transportation Needs: No Transportation Needs  ? Lack of Transportation (Medical): No  ? Lack of Transportation (Non-Medical): No  ?Physical Activity: Inactive  ? Days of Exercise per Week: 0 days  ? Minutes of Exercise per Session: 0 min  ?Stress: No Stress Concern Present  ? Feeling of Stress : Only a little  ?Social Connections: Socially Isolated  ? Frequency of Communication with Friends and Family: Once a week  ? Frequency of Social Gatherings with Friends and Family: Once a week  ? Attends Religious Services: Never  ? Active Member of  Clubs or Organizations: No  ? Attends Banker Meetings: Never  ? Marital Status: Divorced  ? ?Vitals:  ? 05/04/22 0802  ?BP: 130/80  ?Pulse: (!) 104  ?Resp: 16  ?Temp: 98.8 ?F (37.1 ?C)  ?SpO2: 97%  ? ?Wt Readings from Last 3 Encounters:  ?05/04/22 261 lb 6 oz (118.6 kg)  ?12/01/21 261 lb (118.4 kg)  ?09/23/21 262 lb (118.8 kg)  ?Body mass index is 43.5 kg/m?. ? ?Physical Exam ?Vitals and nursing note reviewed.  ?Constitutional:   ?   General: He is not in acute distress. ?   Appearance: He is well-developed.  ?HENT:  ?  Head: Normocephalic and atraumatic.  ?   Mouth/Throat:  ?   Mouth: Mucous membranes are moist.  ?   Pharynx: Oropharynx is clear.  ?   Comments: Missing teeth. ?Eyes:  ?   Conjunctiva/sclera: Conjunctivae normal.  ?Cardiovascular:  ?   Rate and Rhythm: Regular rhythm. Tachycardia present.  ?   Pulses:     ?     Dorsalis pedis pulses are 2+ on the right side and 2+ on the left side.  ?   Heart sounds: No murmur heard. ?   Comments: Trace pitting LE edema, bilateral. ?Pulmonary:  ?   Effort: Pulmonary effort is normal. No respiratory distress.  ?   Breath sounds: Normal breath sounds.  ?Abdominal:  ?   Palpations: Abdomen is soft. There is no hepatomegaly or mass.  ?   Tenderness: There is no abdominal tenderness.  ?Skin: ?   General: Skin is warm.  ?   Findings: No erythema or rash.  ?Neurological:  ?   Mental Status: He is alert and oriented to person, place, and time.  ?   Cranial Nerves: No cranial nerve deficit.  ?   Gait: Gait normal.  ?Psychiatric:  ?   Comments: Well groomed, good eye contact.  ? ?Diabetic Foot Exam - Simple   ?Simple Foot Form ?Diabetic Foot exam was performed with the following findings: Yes 05/04/2022  8:38 AM  ?Visual Inspection ?See comments: Yes ?Sensation Testing ?Intact to touch and monofilament testing bilaterally: Yes ?Pulse Check ?Posterior Tibialis and Dorsalis pulse intact bilaterally: Yes ?Comments ?Long and hypertrophic toenail. ?  ? ?ASSESSMENT AND  PLAN: ? ?Mr.Ethelene Brownsnthony was seen today for follow-up. ? ?Diagnoses and all orders for this visit: ?Orders Placed This Encounter  ?Procedures  ? Comprehensive metabolic panel  ? POC HgB A1c  ? ?Lab Results  ?Compone

## 2022-05-04 ENCOUNTER — Ambulatory Visit (INDEPENDENT_AMBULATORY_CARE_PROVIDER_SITE_OTHER): Payer: Medicare PPO | Admitting: Family Medicine

## 2022-05-04 ENCOUNTER — Encounter: Payer: Self-pay | Admitting: Family Medicine

## 2022-05-04 VITALS — BP 130/80 | HR 104 | Temp 98.8°F | Resp 16 | Ht 65.0 in | Wt 261.4 lb

## 2022-05-04 DIAGNOSIS — I10 Essential (primary) hypertension: Secondary | ICD-10-CM

## 2022-05-04 DIAGNOSIS — Z6841 Body Mass Index (BMI) 40.0 and over, adult: Secondary | ICD-10-CM | POA: Diagnosis not present

## 2022-05-04 DIAGNOSIS — E1159 Type 2 diabetes mellitus with other circulatory complications: Secondary | ICD-10-CM

## 2022-05-04 DIAGNOSIS — E785 Hyperlipidemia, unspecified: Secondary | ICD-10-CM

## 2022-05-04 LAB — POCT GLYCOSYLATED HEMOGLOBIN (HGB A1C): Hemoglobin A1C: 8.5 % — AB (ref 4.0–5.6)

## 2022-05-04 LAB — COMPREHENSIVE METABOLIC PANEL
ALT: 18 U/L (ref 0–53)
AST: 18 U/L (ref 0–37)
Albumin: 4.4 g/dL (ref 3.5–5.2)
Alkaline Phosphatase: 79 U/L (ref 39–117)
BUN: 11 mg/dL (ref 6–23)
CO2: 27 mEq/L (ref 19–32)
Calcium: 9.6 mg/dL (ref 8.4–10.5)
Chloride: 105 mEq/L (ref 96–112)
Creatinine, Ser: 1.22 mg/dL (ref 0.40–1.50)
GFR: 60.14 mL/min (ref >60.00)
Glucose, Bld: 140 mg/dL — ABNORMAL HIGH (ref 70–99)
Potassium: 3.7 mEq/L (ref 3.5–5.1)
Sodium: 141 mEq/L (ref 135–145)
Total Bilirubin: 0.4 mg/dL (ref 0.2–1.2)
Total Protein: 7.9 g/dL (ref 6.0–8.3)

## 2022-05-04 MED ORDER — EMPAGLIFLOZIN-METFORMIN HCL 12.5-500 MG PO TABS: 1.0000 | ORAL_TABLET | Freq: Two times a day (BID) | ORAL | 0 refills | Status: DC

## 2022-05-04 MED ORDER — GLIMEPIRIDE 4 MG PO TABS: ORAL_TABLET | ORAL | 1 refills | Status: DC

## 2022-05-04 MED ORDER — SHINGRIX 50 MCG/0.5ML IM SUSR: INTRAMUSCULAR | 1 refills | Status: DC

## 2022-05-04 NOTE — Assessment & Plan Note (Addendum)
Wt otherwise stable. ?We discussed benefits of wt loss as well as adverse effects of obesity. ?Consistency with healthy diet and physical activity recommended. ?He is not interested in medications like Ozempic or Mounjaro. ?

## 2022-05-04 NOTE — Assessment & Plan Note (Signed)
BP adequately controlled. ?Continue current management: Amlodipine 10 mg daily,Avapro 75 mg daily,and Metoprolol succinate 50 mg daily.DASH/low salt diet recommended. ?Monitor BP at home. ?Eye exam  Is current. ?

## 2022-05-04 NOTE — Patient Instructions (Addendum)
A few things to remember from today's visit: ? ?Type 2 diabetes mellitus with vascular disease (HCC) - Plan: POC HgB A1c, Empagliflozin-metFORMIN HCl 12.5-500 MG TABS, glimepiride (AMARYL) 4 MG tablet, Comprehensive metabolic panel ? ?Essential hypertension - Plan: Comprehensive metabolic panel ? ?If you need refills please call your pharmacy. ?Do not use My Chart to request refills or for acute issues that need immediate attention. ?  ?Today Metformin tabs discontinued and will start combination of Metformin (lower dose) and Omphali. ?No changes in Glimepiride for now. ? ?Please be sure medication list is accurate. ?If a new problem present, please set up appointment sooner than planned today. ? ?Diabetes Mellitus and Nutrition, Adult ?When you have diabetes, or diabetes mellitus, it is very important to have healthy eating habits because your blood sugar (glucose) levels are greatly affected by what you eat and drink. Eating healthy foods in the right amounts, at about the same times every day, can help you: ?Manage your blood glucose. ?Lower your risk of heart disease. ?Improve your blood pressure. ?Reach or maintain a healthy weight. ?What can affect my meal plan? ?Every person with diabetes is different, and each person has different needs for a meal plan. Your health care provider may recommend that you work with a dietitian to make a meal plan that is best for you. Your meal plan may vary depending on factors such as: ?The calories you need. ?The medicines you take. ?Your weight. ?Your blood glucose, blood pressure, and cholesterol levels. ?Your activity level. ?Other health conditions you have, such as heart or kidney disease. ?How do carbohydrates affect me? ?Carbohydrates, also called carbs, affect your blood glucose level more than any other type of food. Eating carbs raises the amount of glucose in your blood. ?It is important to know how many carbs you can safely have in each meal. This is different for  every person. Your dietitian can help you calculate how many carbs you should have at each meal and for each snack. ?How does alcohol affect me? ?Alcohol can cause a decrease in blood glucose (hypoglycemia), especially if you use insulin or take certain diabetes medicines by mouth. Hypoglycemia can be a life-threatening condition. Symptoms of hypoglycemia, such as sleepiness, dizziness, and confusion, are similar to symptoms of having too much alcohol. ?Do not drink alcohol if: ?Your health care provider tells you not to drink. ?You are pregnant, may be pregnant, or are planning to become pregnant. ?If you drink alcohol: ?Limit how much you have to: ?0-1 drink a day for women. ?0-2 drinks a day for men. ?Know how much alcohol is in your drink. In the [place] one drink equals one 12 oz bottle of beer (355 mL), one 5 oz glass of wine (148 mL), or one 1? oz glass of hard liquor (44 mL). ?Keep yourself hydrated with water, diet soda, or unsweetened iced tea. Keep in mind that regular soda, juice, and other mixers may contain a lot of sugar and must be counted as carbs. ?What are tips for following this plan? ? ?Reading food labels ?Start by checking the serving size on the Nutrition Facts label of packaged foods and drinks. The number of calories and the amount of carbs, fats, and other nutrients listed on the label are based on one serving of the item. Many items contain more than one serving per package. ?Check the total grams (g) of carbs in one serving. ?Check the number of grams of saturated fats and trans fats in one serving. Choose  foods that have a low amount or none of these fats. ?Check the number of milligrams (mg) of salt (sodium) in one serving. Most people should limit total sodium intake to less than 2,300 mg per day. ?Always check the nutrition information of foods labeled as "low-fat" or "nonfat." These foods may be higher in added sugar or refined carbs and should be avoided. ?Talk to your dietitian to  identify your daily goals for nutrients listed on the label. ?Shopping ?Avoid buying canned, pre-made, or processed foods. These foods tend to be high in fat, sodium, and added sugar. ?Shop around the outside edge of the grocery store. This is where you will most often find fresh fruits and vegetables, bulk grains, fresh meats, and fresh dairy products. ?Cooking ?Use low-heat cooking methods, such as baking, instead of high-heat cooking methods, such as deep frying. ?Cook using healthy oils, such as olive, canola, or sunflower oil. ?Avoid cooking with butter, cream, or high-fat meats. ?Meal planning ?Eat meals and snacks regularly, preferably at the same times every day. Avoid going long periods of time without eating. ?Eat foods that are high in fiber, such as fresh fruits, vegetables, beans, and whole grains. ?Eat 4-6 oz (112-168 g) of lean protein each day, such as lean meat, chicken, fish, eggs, or tofu. One ounce (oz) (28 g) of lean protein is equal to: ?1 oz (28 g) of meat, chicken, or fish. ?1 egg. ?? cup (62 g) of tofu. ?Eat some foods each day that contain healthy fats, such as avocado, nuts, seeds, and fish. ?What foods should I eat? ?Fruits ?Berries. Apples. Oranges. Peaches. Apricots. Plums. Grapes. Mangoes. Papayas. Pomegranates. Kiwi. Cherries. ?Vegetables ?Leafy greens, including lettuce, spinach, kale, chard, collard greens, mustard greens, and cabbage. Beets. Cauliflower. Broccoli. Carrots. Green beans. Tomatoes. Peppers. Onions. Cucumbers. Brussels sprouts. ?Grains ?Whole grains, such as whole-wheat or whole-grain bread, crackers, tortillas, cereal, and pasta. Unsweetened oatmeal. Quinoa. Brown or wild rice. ?Meats and other proteins ?Seafood. Poultry without skin. Lean cuts of poultry and beef. Tofu. Nuts. Seeds. ?Dairy ?Low-fat or fat-free dairy products such as milk, yogurt, and cheese. ?The items listed above may not be a complete list of foods and beverages you can eat and drink. Contact a  dietitian for more information. ?What foods should I avoid? ?Fruits ?Fruits canned with syrup. ?Vegetables ?Canned vegetables. Frozen vegetables with butter or cream sauce. ?Grains ?Refined white flour and flour products such as bread, pasta, snack foods, and cereals. Avoid all processed foods. ?Meats and other proteins ?Fatty cuts of meat. Poultry with skin. Breaded or fried meats. Processed meat. Avoid saturated fats. ?Dairy ?Full-fat yogurt, cheese, or milk. ?Beverages ?Sweetened drinks, such as soda or iced tea. ?The items listed above may not be a complete list of foods and beverages you should avoid. Contact a dietitian for more information. ?Questions to ask a health care provider ?Do I need to meet with a certified diabetes care and education specialist? ?Do I need to meet with a dietitian? ?What number can I call if I have questions? ?When are the best times to check my blood glucose? ?Where to find more information: ?American Diabetes Association: diabetes.org ?Academy of Nutrition and Dietetics: eatright.org ?[employer] of Diabetes and Digestive and Kidney Diseases: StageSync.si ?Association of Diabetes Care & Education Specialists: diabeteseducator.org ?Summary ?It is important to have healthy eating habits because your blood sugar (glucose) levels are greatly affected by what you eat and drink. It is important to use alcohol carefully. ?A healthy meal plan will help  you manage your blood glucose and lower your risk of heart disease. ?Your health care provider may recommend that you work with a dietitian to make a meal plan that is best for you. ?This information is not intended to replace advice given to you by your health care provider. Make sure you discuss any questions you have with your health care provider. ?Document Revised: 07/10/2020 Document Reviewed: 07/10/2020 ?Elsevier Patient Education ? 2023 Elsevier Inc. ? ? ? ? ? ? ?

## 2022-05-04 NOTE — Assessment & Plan Note (Addendum)
Problem is not well controlled. HgA1C went from 7.6 to 8.5. ?He does not want to try Actos, he is not interested in any injectable medication like trulicity or basal insulin. ?Metformin dose decreased from 1000 mg bid to 500 mg bid and Empaglifloxin 12.5 mg bid added (combination tab), he may tolerate metformin better with another medication. No changes in Glimepiride, instructed to take it 10-15 min before breakfast. ?Regular exercise and healthy diet with avoidance of added sugar food intake is an important part of treatment and encouraged. ?Annual eye exam, periodic dental and foot care recommended. ?F/U in 4 months ?

## 2022-05-25 ENCOUNTER — Emergency Department (HOSPITAL_COMMUNITY): Payer: Medicare PPO

## 2022-05-25 ENCOUNTER — Other Ambulatory Visit: Payer: Self-pay

## 2022-05-25 ENCOUNTER — Emergency Department (HOSPITAL_COMMUNITY)
Admission: EM | Admit: 2022-05-25 | Discharge: 2022-05-26 | Disposition: A | Discharge status: Home / Self Care | Payer: Medicare PPO | Attending: Emergency Medicine | Admitting: Emergency Medicine

## 2022-05-25 ENCOUNTER — Encounter (HOSPITAL_COMMUNITY): Payer: Self-pay

## 2022-05-25 DIAGNOSIS — M546 Pain in thoracic spine: Secondary | ICD-10-CM | POA: Diagnosis not present

## 2022-05-25 DIAGNOSIS — M542 Cervicalgia: Secondary | ICD-10-CM | POA: Diagnosis not present

## 2022-05-25 DIAGNOSIS — R109 Unspecified abdominal pain: Secondary | ICD-10-CM | POA: Diagnosis not present

## 2022-05-25 DIAGNOSIS — R519 Headache, unspecified: Secondary | ICD-10-CM | POA: Insufficient documentation

## 2022-05-25 DIAGNOSIS — S161XXA Strain of muscle, fascia and tendon at neck level, initial encounter: Secondary | ICD-10-CM | POA: Insufficient documentation

## 2022-05-25 DIAGNOSIS — S199XXA Unspecified injury of neck, initial encounter: Secondary | ICD-10-CM | POA: Diagnosis present

## 2022-05-25 DIAGNOSIS — K429 Umbilical hernia without obstruction or gangrene: Secondary | ICD-10-CM | POA: Diagnosis not present

## 2022-05-25 DIAGNOSIS — M545 Low back pain, unspecified: Secondary | ICD-10-CM | POA: Diagnosis not present

## 2022-05-25 DIAGNOSIS — M25552 Pain in left hip: Secondary | ICD-10-CM | POA: Insufficient documentation

## 2022-05-25 DIAGNOSIS — E119 Type 2 diabetes mellitus without complications: Secondary | ICD-10-CM | POA: Diagnosis not present

## 2022-05-25 DIAGNOSIS — Z20822 Contact with and (suspected) exposure to covid-19: Secondary | ICD-10-CM | POA: Diagnosis not present

## 2022-05-25 DIAGNOSIS — I10 Essential (primary) hypertension: Secondary | ICD-10-CM | POA: Insufficient documentation

## 2022-05-25 DIAGNOSIS — Z87891 Personal history of nicotine dependence: Secondary | ICD-10-CM | POA: Diagnosis not present

## 2022-05-25 DIAGNOSIS — X503XXA Overexertion from repetitive movements, initial encounter: Secondary | ICD-10-CM | POA: Diagnosis not present

## 2022-05-25 DIAGNOSIS — R079 Chest pain, unspecified: Secondary | ICD-10-CM | POA: Diagnosis not present

## 2022-05-25 LAB — CBC WITH DIFFERENTIAL/PLATELET
Abs Immature Granulocytes: 0.03 10*3/uL (ref 0.00–0.07)
Basophils Absolute: 0.1 10*3/uL (ref 0.0–0.1)
Basophils Relative: 1 %
Eosinophils Absolute: 0 10*3/uL (ref 0.0–0.5)
Eosinophils Relative: 0 %
HCT: 43.5 % (ref 39.0–52.0)
Hemoglobin: 13 g/dL (ref 13.0–17.0)
Immature Granulocytes: 0 %
Lymphocytes Relative: 24 %
Lymphs Abs: 3 10*3/uL (ref 0.7–4.0)
MCH: 23.5 pg — ABNORMAL LOW (ref 26.0–34.0)
MCHC: 29.9 g/dL — ABNORMAL LOW (ref 30.0–36.0)
MCV: 78.7 fL — ABNORMAL LOW (ref 80.0–100.0)
Monocytes Absolute: 1.3 10*3/uL — ABNORMAL HIGH (ref 0.1–1.0)
Monocytes Relative: 10 %
Neutro Abs: 8 10*3/uL — ABNORMAL HIGH (ref 1.7–7.7)
Neutrophils Relative %: 65 %
Platelets: 186 10*3/uL (ref 150–400)
RBC: 5.53 MIL/uL (ref 4.22–5.81)
RDW: 16.7 % — ABNORMAL HIGH (ref 11.5–15.5)
WBC: 12.4 10*3/uL — ABNORMAL HIGH (ref 4.0–10.5)
nRBC: 0 % (ref 0.0–0.2)

## 2022-05-25 LAB — COMPREHENSIVE METABOLIC PANEL
ALT: 18 U/L (ref 0–44)
AST: 19 U/L (ref 15–41)
Albumin: 3.8 g/dL (ref 3.5–5.0)
Alkaline Phosphatase: 72 U/L (ref 38–126)
Anion gap: 11 (ref 5–15)
BUN: 10 mg/dL (ref 8–23)
CO2: 23 mmol/L (ref 22–32)
Calcium: 8.9 mg/dL (ref 8.9–10.3)
Chloride: 104 mmol/L (ref 98–111)
Creatinine, Ser: 1 mg/dL (ref 0.61–1.24)
GFR, Estimated: >60 mL/min (ref >60)
Glucose, Bld: 141 mg/dL — ABNORMAL HIGH (ref 70–99)
Potassium: 3.2 mmol/L — ABNORMAL LOW (ref 3.5–5.1)
Sodium: 138 mmol/L (ref 135–145)
Total Bilirubin: 0.7 mg/dL (ref 0.3–1.2)
Total Protein: 7.9 g/dL (ref 6.5–8.1)

## 2022-05-25 LAB — RESP PANEL BY RT-PCR (FLU A&B, COVID) ARPGX2
Influenza A by PCR: NEGATIVE
Influenza B by PCR: NEGATIVE
SARS Coronavirus 2 by RT PCR: NEGATIVE

## 2022-05-25 LAB — TROPONIN I (HIGH SENSITIVITY)
Troponin I (High Sensitivity): 7 ng/L (ref <18)
Troponin I (High Sensitivity): 8 ng/L (ref <18)

## 2022-05-25 LAB — LACTIC ACID, PLASMA
Lactic Acid, Venous: 1.5 mmol/L (ref 0.5–1.9)
Lactic Acid, Venous: 1.7 mmol/L (ref 0.5–1.9)

## 2022-05-25 MED ORDER — ACETAMINOPHEN 325 MG PO TABS
650.0000 mg | ORAL_TABLET | Freq: Once | ORAL | Status: AC
  Administered 2022-05-25: 650 mg via ORAL
  Filled 2022-05-25: qty 2

## 2022-05-25 MED ORDER — SODIUM CHLORIDE 0.9 % IV BOLUS
500.0000 mL | Freq: Once | INTRAVENOUS | Status: AC
  Administered 2022-05-25: 500 mL via INTRAVENOUS

## 2022-05-25 MED ORDER — OXYCODONE HCL 5 MG PO TABS
5.0000 mg | ORAL_TABLET | Freq: Once | ORAL | Status: AC
  Administered 2022-05-25: 5 mg via ORAL
  Filled 2022-05-25: qty 1

## 2022-05-25 NOTE — ED Provider Triage Note (Signed)
Emergency Medicine Provider Triage Evaluation Note  RAUN KEIL , a 71 y.o. male  was evaluated in triage.  Pt complains of left hip pain that has been gradually moving superiorly to his neck for the past 5-6 days. Reports some SOB as well, but mainly reports that he has pain in his back with inhalation. Reports some intermittent chest pain as well. The patient reports he has not been able to ear in a few days because of the pain as well. 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   Review of Systems  Positive:  Negative:   Physical Exam  BP (!) 173/79 (BP Location: Left Arm)   Pulse (!) 116   Temp 100.2 F (37.9 C) (Oral)   Resp 18   Ht 5\' 5"  (1.651 m)   Wt 117.9 kg   SpO2 93%   BMI 43.27 kg/m  Gen:   Awake, no distress   Resp:  Normal effort  MSK:   Moves extremities without difficulty  Other:  Tenderness to bilateral trap. Limited ROM 2/2 pain of neck.   Medical Decision Making  Medically screening exam initiated at 1:33 PM.  Appropriate orders placed.  PATTON KOSS was informed that the remainder of the evaluation will be completed by another provider, this initial triage assessment does not replace that evaluation, and the importance of remaining in the ED until their evaluation is complete.  Patient has an elevated temp in triage. Discussed with my attending. Will order lactic, blood cultures, and initial SOB workup. Nursing alerted that patient needs to be roomed.    Sherrell Puller, [place] 05/25/22 1338

## 2022-05-25 NOTE — ED Provider Notes (Signed)
Screening exam-patient presenting for evaluation of neck pain, low back pain, left elbow pain.  Onset of symptoms several days ago, gradually worse.  He is taking his usual medications without relief.  Exam-no dysarthria or aphasia.  Nontoxic appearance.  Lungs clear bilaterally.  Heart tachycardic.  Moves arms and legs normally.  He is able to sit up.  Neck motion diminished for extension, but not flexion, secondary to pain.  Neurologic-no dysarthria or aphasia.  Plan-start IV fluids, oxycodone for pain, check CT head and CT cervical spine.  Suspect arthritis causing pain.  He did have borderline fever on arrival.   Mancel Bale, MD 05/25/22 2256

## 2022-05-25 NOTE — ED Provider Notes (Signed)
[facility]ENC[facility]PT [facility] Emergency Department Provider Note MRN:  [id number]  Arrival date & time: 05/26/22     Chief Complaint   Back Pain and Neck Pain   History of Present Illness   Charles [name] is a 71 y.o. year-old male with a history of hypertension, diabetes, peripheral vascular disease presenting to the ED with chief complaint of hip and neck pain.  Woke up with mild left hip pain 4 days ago, pain is rising and worsening into the left flank, left thoracic back, now having right-sided neck pain and stiffness this morning.  Was in bed and could not get up due to the pain in both sides of his body, could not roll either way in the bed.  Has been laying around all week, thinks he might be dehydrated.  Review of Systems  A thorough review of systems was obtained and all systems are negative except as noted in the HPI and PMH.   Patient's Health History    Past Medical History:  Diagnosis Date   Abnormal carotid ultrasound 07/2015   mild bilateral stenosis, repeat in 1 year.  Dr. Rennis Golden   Diabetes mellitus without complication (HCC)    Gout    Hepatitis C antibody test positive 05/2015   prior infection, resolved   History of cardiovascular stress test 9/16   Lexiscan, EF 55%, no ischemia, Dr. Rennis Golden   Hyperlipidemia    Hypertension    Noncompliance    Obesity    PVD (peripheral vascular disease) (HCC) 07/2015   normal ABIs, Dr. Rennis Golden    Past Surgical History:  Procedure Laterality Date   BALLOON ANGIOPLASTY, ARTERY N/A also placed stent   COLONOSCOPY  2016   Dr. Elnoria Howard    Family History  Problem Relation Age of Onset   Asthma Mother    Heart attack Mother    Hypertension Mother    Hyperlipidemia Mother    Hypertension Father    Diabetes Father    HIV Sister    Liver disease Brother    Lung cancer Brother    Hypertension Brother     Social History   Socioeconomic History   Marital status: Divorced    Spouse name: Not on file   Number of  children: Not on file   Years of education: Not on file   Highest education level: Not on file  Occupational History   Not on file  Tobacco Use   Smoking status: Former    Packs/day: 1.00    Years: 30.00    Pack years: 30.00    Types: Cigarettes    Quit date: 07/31/1995    Years since quitting: 26.8   Smokeless tobacco: Never  Vaping Use   Vaping Use: Never used  Substance and Sexual Activity   Alcohol use: No    Alcohol/week: 0.0 standard drinks   Drug use: No   Sexual activity: Not on file  Other Topics Concern   Not on file  Social History Narrative   Not on file   Social Determinants of Health   Financial Resource Strain: Medium Risk   Difficulty of Paying Living Expenses: Somewhat hard  Food Insecurity: No Food Insecurity   Worried About Programme researcher, broadcasting/film/video in the Last Year: Never true   Ran Out of Food in the Last Year: Never true  Transportation Needs: No Transportation Needs   Lack of Transportation (Medical): No   Lack of Transportation (Non-Medical): No  Physical Activity: Inactive   Days of  Exercise per Week: 0 days   Minutes of Exercise per Session: 0 min  Stress: No Stress Concern Present   Feeling of Stress : Only a little  Social Connections: Socially Isolated   Frequency of Communication with Friends and Family: Once a week   Frequency of Social Gatherings with Friends and Family: Once a week   Attends Religious Services: Never   Database administratorActive Member of Clubs or Organizations: No   Attends Engineer, structuralClub or Organization Meetings: Never   Marital Status: Divorced  Catering managerntimate Partner Violence: Not At Risk   Fear of Current or Ex-Partner: No   Emotionally Abused: No   Physically Abused: No   Sexually Abused: No     Physical Exam   Vitals:   05/25/22 2352 05/26/22 0053  BP: (!) 220/191 (!) 167/93  Pulse: (!) 115 (!) 115  Resp: 20 18  Temp:    SpO2: 96% 94%    CONSTITUTIONAL: Well-appearing, NAD NEURO/PSYCH:  Alert and oriented x 3, normal and symmetric  strength and sensation, normal coordination, normal speech EYES:  eyes equal and reactive ENT/NECK:  no LAD, no JVD CARDIO: Regular rate, well-perfused, normal S1 and S2 PULM:  CTAB no wheezing or rhonchi GI/GU:  non-distended, non-tender MSK/SPINE:  No gross deformities, no edema SKIN:  no rash, atraumatic   *Additional and/or pertinent findings included in MDM below  Diagnostic and Interventional Summary    EKG Interpretation  Date/Time:    Ventricular Rate:    PR Interval:    QRS Duration:   QT Interval:    QTC Calculation:   R Axis:     Text Interpretation:         Labs Reviewed  CBC WITH DIFFERENTIAL/PLATELET - Abnormal; Notable for the following components:      Result Value   WBC 12.4 (*)    MCV 78.7 (*)    MCH 23.5 (*)    MCHC 29.9 (*)    RDW 16.7 (*)    Neutro Abs 8.0 (*)    Monocytes Absolute 1.3 (*)    All other components within normal limits  COMPREHENSIVE METABOLIC PANEL - Abnormal; Notable for the following components:   Potassium 3.2 (*)    Glucose, Bld 141 (*)    All other components within normal limits  URINALYSIS, ROUTINE W REFLEX MICROSCOPIC - Abnormal; Notable for the following components:   Ketones, ur 20 (*)    Protein, ur 30 (*)    All other components within normal limits  RESP PANEL BY RT-PCR (FLU A&B, COVID) ARPGX2  CULTURE, BLOOD (ROUTINE X 2)  CULTURE, BLOOD (ROUTINE X 2)  LACTIC ACID, PLASMA  LACTIC ACID, PLASMA  TROPONIN I (HIGH SENSITIVITY)  TROPONIN I (HIGH SENSITIVITY)    CT ABDOMEN PELVIS WO CONTRAST  Final Result    CT L-SPINE NO CHARGE  Final Result    CT Head Wo Contrast  Final Result    CT Cervical Spine Wo Contrast  Final Result    DG Chest 2 View  Final Result      Medications  sodium chloride 0.9 % bolus 500 mL (500 mLs Intravenous New Bag/Given 05/25/22 2309)  acetaminophen (TYLENOL) tablet 650 mg (650 mg Oral Given 05/25/22 2308)  oxyCODONE (Oxy IR/ROXICODONE) immediate release tablet 5 mg (5 mg Oral  Given 05/25/22 2308)     Procedures  /  Critical Care Procedures  ED Course and Medical Decision Making  Initial Impression and Ddx Differential diagnosis includes muscle strain or spasm, a bit odd that  he is having pain bilaterally and rising.  No neurological deficits on exam.  Patient with tachycardia and oral temp 100.2 on arrival and so dehydration is also considered as it is an infectious cause.  Pyelonephritis, UTI.  No signs of soft tissue infection.  Retroperitoneal hematoma also considered.  Awaiting CT imaging, labs.  Past medical/surgical history that increases complexity of ED encounter: PVD, diabetes  Interpretation of Diagnostics I personally reviewed the laboratory assessment and my interpretation is as follows: No significant blood count or electrolyte  CT imaging is without acute or emergent process.  Patient Reassessment and Ultimate Disposition/Management     Patient feeling better, no true fever here in the emergency department, tachycardia resolved after fluids, no signs of infection or sepsis, favoring MSK, appropriate for discharge.  Patient management required discussion with the following services or consulting groups:  None  Complexity of Problems Addressed Acute illness or injury that poses threat of life of bodily function  Additional Data Reviewed and Analyzed Further history obtained from: Prior labs/imaging results  Additional Factors Impacting ED Encounter Risk Prescriptions and Consideration of hospitalization  Elmer Sow. Pilar Plate, MD [facility] Emergency Medicine [facility] [email or web address]  Final Clinical Impressions(s) / ED Diagnoses     ICD-10-CM   1. Strain of neck muscle, initial encounter  S16.1XXA     2. Flank pain  R10.9       ED Discharge Orders          Ordered    methocarbamol (ROBAXIN) 500 MG tablet  Every 8 hours PRN        05/26/22 0156    lidocaine (LIDODERM) 5 %  Every 24 hours        05/26/22 0156     naproxen (NAPROSYN) 500 MG tablet  2 times daily        05/26/22 0156             Discharge Instructions Discussed with and Provided to Patient:     Discharge Instructions      You were evaluated in the Emergency Department and after careful evaluation, we did not find any emergent condition requiring admission or further testing in the hospital.  Your exam/testing today was overall reassuring.  Symptoms seem to be due to muscle strain or spasm.  Recommend taking the Naprosyn anti-inflammatory for discomfort.  Can also use the Robaxin for more significant pain.  The Lidoderm patches throughout the day may help as well.  Please return to the Emergency Department if you experience any worsening of your condition.  Thank you for allowing [place] to be a part of your care.        Sabas Sous, MD 05/26/22 [id number]

## 2022-05-25 NOTE — ED Triage Notes (Signed)
Patient c/o pain that started in his left hip and traveled to the right lateral neck within 4 days. Patient states pain increases with movement and having trouble turning his head from side to side.

## 2022-05-26 LAB — URINALYSIS, ROUTINE W REFLEX MICROSCOPIC
Bacteria, UA: NONE SEEN
Bilirubin Urine: NEGATIVE
Glucose, UA: NEGATIVE mg/dL
Hgb urine dipstick: NEGATIVE
Ketones, ur: 20 mg/dL — AB
Leukocytes,Ua: NEGATIVE
Nitrite: NEGATIVE
Protein, ur: 30 mg/dL — AB
Specific Gravity, Urine: 1.018 (ref 1.005–1.030)
pH: 5 (ref 5.0–8.0)

## 2022-05-26 MED ORDER — NAPROXEN 500 MG PO TABS: 500.0000 mg | ORAL_TABLET | Freq: Two times a day (BID) | ORAL | 0 refills | Status: DC

## 2022-05-26 MED ORDER — METHOCARBAMOL 500 MG PO TABS: 500.0000 mg | ORAL_TABLET | Freq: Three times a day (TID) | ORAL | 0 refills | Status: DC | PRN

## 2022-05-26 MED ORDER — LIDOCAINE 5 % EX PTCH: 1.0000 | MEDICATED_PATCH | CUTANEOUS | 0 refills | Status: DC

## 2022-05-26 MED ORDER — OXYCODONE HCL 5 MG PO TABS
5.0000 mg | ORAL_TABLET | Freq: Once | ORAL | Status: AC
  Administered 2022-05-26: 5 mg via ORAL
  Filled 2022-05-26: qty 1

## 2022-05-26 NOTE — Discharge Instructions (Signed)
You were evaluated in the Emergency Department and after careful evaluation, we did not find any emergent condition requiring admission or further testing in the hospital.  Your exam/testing today was overall reassuring.  Symptoms seem to be due to muscle strain or spasm.  Recommend taking the Naprosyn anti-inflammatory for discomfort.  Can also use the Robaxin for more significant pain.  The Lidoderm patches throughout the day may help as well.  Please return to the Emergency Department if you experience any worsening of your condition.  Thank you for allowing [place] to be a part of your care.

## 2022-05-30 LAB — CULTURE, BLOOD (ROUTINE X 2)
Culture: NO GROWTH
Culture: NO GROWTH
Special Requests: ADEQUATE

## 2022-06-08 ENCOUNTER — Other Ambulatory Visit: Payer: Self-pay | Admitting: Family Medicine

## 2022-06-08 DIAGNOSIS — I10 Essential (primary) hypertension: Secondary | ICD-10-CM

## 2022-07-15 DIAGNOSIS — E1165 Type 2 diabetes mellitus with hyperglycemia: Secondary | ICD-10-CM | POA: Diagnosis not present

## 2022-07-15 DIAGNOSIS — R197 Diarrhea, unspecified: Secondary | ICD-10-CM | POA: Diagnosis not present

## 2022-07-15 DIAGNOSIS — Z1211 Encounter for screening for malignant neoplasm of colon: Secondary | ICD-10-CM | POA: Diagnosis not present

## 2022-07-29 ENCOUNTER — Encounter (INDEPENDENT_AMBULATORY_CARE_PROVIDER_SITE_OTHER): Payer: Self-pay

## 2022-07-30 DIAGNOSIS — K635 Polyp of colon: Secondary | ICD-10-CM | POA: Diagnosis not present

## 2022-07-30 DIAGNOSIS — K52832 Lymphocytic colitis: Secondary | ICD-10-CM | POA: Diagnosis not present

## 2022-07-30 DIAGNOSIS — D125 Benign neoplasm of sigmoid colon: Secondary | ICD-10-CM | POA: Diagnosis not present

## 2022-07-30 DIAGNOSIS — R197 Diarrhea, unspecified: Secondary | ICD-10-CM | POA: Diagnosis not present

## 2022-07-30 DIAGNOSIS — D124 Benign neoplasm of descending colon: Secondary | ICD-10-CM | POA: Diagnosis not present

## 2022-07-30 DIAGNOSIS — D123 Benign neoplasm of transverse colon: Secondary | ICD-10-CM | POA: Diagnosis not present

## 2022-07-30 DIAGNOSIS — Z86010 Personal history of colonic polyps: Secondary | ICD-10-CM | POA: Diagnosis not present

## 2022-08-05 ENCOUNTER — Other Ambulatory Visit: Payer: Self-pay | Admitting: Family Medicine

## 2022-08-05 DIAGNOSIS — E785 Hyperlipidemia, unspecified: Secondary | ICD-10-CM

## 2022-09-01 ENCOUNTER — Ambulatory Visit (INDEPENDENT_AMBULATORY_CARE_PROVIDER_SITE_OTHER): Payer: Medicare PPO

## 2022-09-01 ENCOUNTER — Telehealth: Payer: Self-pay

## 2022-09-01 VITALS — Ht 66.0 in | Wt 260.0 lb

## 2022-09-01 DIAGNOSIS — Z0000 Encounter for general adult medical examination without abnormal findings: Secondary | ICD-10-CM | POA: Diagnosis not present

## 2022-09-01 NOTE — Patient Instructions (Signed)
Charles [name] , Thank you for taking time to come for your Medicare Wellness Visit. I appreciate your ongoing commitment to your health goals. Please review the following plan we discussed and let me know if I can assist you in the future.   Screening recommendations/referrals: Colonoscopy: completed 07/30/2022, due 07/30/2025 Recommended yearly ophthalmology/optometry visit for glaucoma screening and checkup Recommended yearly dental visit for hygiene and checkup  Vaccinations: Influenza vaccine: due Pneumococcal vaccine: completed 06/01/2022 Tdap vaccine: n/a Shingles vaccine: discussed   Covid-19:  04/02/2021, 09/16/2020, 02/17/2020, 01/27/2020  Advanced directives: Advance directive discussed with you today.   Conditions/risks identified: none  Next appointment: Follow up in one year for your annual wellness visit.   Preventive Care 70 Years and Older, Male Preventive care refers to lifestyle choices and visits with your health care provider that can promote health and wellness. What does preventive care include? A yearly physical exam. This is also called an annual well check. Dental exams once or twice a year. Routine eye exams. Ask your health care provider how often you should have your eyes checked. Personal lifestyle choices, including: Daily care of your teeth and gums. Regular physical activity. Eating a healthy diet. Avoiding tobacco and drug use. Limiting alcohol use. Practicing safe sex. Taking low doses of aspirin every day. Taking vitamin and mineral supplements as recommended by your health care provider. What happens during an annual well check? The services and screenings done by your health care provider during your annual well check will depend on your age, overall health, lifestyle risk factors, and family history of disease. Counseling  Your health care provider may ask you questions about your: Alcohol use. Tobacco use. Drug use. Emotional well-being. Home  and relationship well-being. Sexual activity. Eating habits. History of falls. Memory and ability to understand (cognition). Work and work Astronomer. Screening  You may have the following tests or measurements: Height, weight, and BMI. Blood pressure. Lipid and cholesterol levels. These may be checked every 5 years, or more frequently if you are over 70 years old. Skin check. Lung cancer screening. You may have this screening every year starting at age 70 if you have a 30-pack-year history of smoking and currently smoke or have quit within the past 15 years. Fecal occult blood test (FOBT) of the stool. You may have this test every year starting at age 70. Flexible sigmoidoscopy or colonoscopy. You may have a sigmoidoscopy every 5 years or a colonoscopy every 10 years starting at age 70. Prostate cancer screening. Recommendations will vary depending on your family history and other risks. Hepatitis C blood test. Hepatitis B blood test. Sexually transmitted disease (STD) testing. Diabetes screening. This is done by checking your blood sugar (glucose) after you have not eaten for a while (fasting). You may have this done every 1-3 years. Abdominal aortic aneurysm (AAA) screening. You may need this if you are a current or former smoker. Osteoporosis. You may be screened starting at age 70 if you are at high risk. Talk with your health care provider about your test results, treatment options, and if necessary, the need for more tests. Vaccines  Your health care provider may recommend certain vaccines, such as: Influenza vaccine. This is recommended every year. Tetanus, diphtheria, and acellular pertussis (Tdap, Td) vaccine. You may need a Td booster every 10 years. Zoster vaccine. You may need this after age 70. Pneumococcal 13-valent conjugate (PCV13) vaccine. One dose is recommended after age 70. Pneumococcal polysaccharide (PPSV23) vaccine. One dose is recommended after  age 70. Talk to  your health care provider about which screenings and vaccines you need and how often you need them. This information is not intended to replace advice given to you by your health care provider. Make sure you discuss any questions you have with your health care provider. Document Released: 01/03/2016 Document Revised: 08/26/2016 Document Reviewed: 10/08/2015 Elsevier Interactive Patient Education  2017 [place] Prevention in the Home Falls can cause injuries. They can happen to people of all ages. There are many things you can do to make your home safe and to help prevent falls. What can I do on the outside of my home? Regularly fix the edges of walkways and driveways and fix any cracks. Remove anything that might make you trip as you walk through a door, such as a raised step or threshold. Trim any bushes or trees on the path to your home. Use bright outdoor lighting. Clear any walking paths of anything that might make someone trip, such as rocks or tools. Regularly check to see if handrails are loose or broken. Make sure that both sides of any steps have handrails. Any raised decks and porches should have guardrails on the edges. Have any leaves, snow, or ice cleared regularly. Use sand or salt on walking paths during winter. Clean up any spills in your garage right away. This includes oil or grease spills. What can I do in the bathroom? Use night lights. Install grab bars by the toilet and in the tub and shower. Do not use towel bars as grab bars. Use non-skid mats or decals in the tub or shower. If you need to sit down in the shower, use a plastic, non-slip stool. Keep the floor dry. Clean up any water that spills on the floor as soon as it happens. Remove soap buildup in the tub or shower regularly. Attach bath mats securely with double-sided non-slip rug tape. Do not have throw rugs and other things on the floor that can make you trip. What can I do in the bedroom? Use  night lights. Make sure that you have a light by your bed that is easy to reach. Do not use any sheets or blankets that are too big for your bed. They should not hang down onto the floor. Have a firm chair that has side arms. You can use this for support while you get dressed. Do not have throw rugs and other things on the floor that can make you trip. What can I do in the kitchen? Clean up any spills right away. Avoid walking on wet floors. Keep items that you use a lot in easy-to-reach places. If you need to reach something above you, use a strong step stool that has a grab bar. Keep electrical cords out of the way. Do not use floor polish or wax that makes floors slippery. If you must use wax, use non-skid floor wax. Do not have throw rugs and other things on the floor that can make you trip. What can I do with my stairs? Do not leave any items on the stairs. Make sure that there are handrails on both sides of the stairs and use them. Fix handrails that are broken or loose. Make sure that handrails are as long as the stairways. Check any carpeting to make sure that it is firmly attached to the stairs. Fix any carpet that is loose or worn. Avoid having throw rugs at the top or bottom of the stairs. If you do  have throw rugs, attach them to the floor with carpet tape. Make sure that you have a light switch at the top of the stairs and the bottom of the stairs. If you do not have them, ask someone to add them for you. What else can I do to help prevent falls? Wear shoes that: Do not have high heels. Have rubber bottoms. Are comfortable and fit you well. Are closed at the toe. Do not wear sandals. If you use a stepladder: Make sure that it is fully opened. Do not climb a closed stepladder. Make sure that both sides of the stepladder are locked into place. Ask someone to hold it for you, if possible. Clearly mark and make sure that you can see: Any grab bars or handrails. First and last  steps. Where the edge of each step is. Use tools that help you move around (mobility aids) if they are needed. These include: Canes. Walkers. Scooters. Crutches. Turn on the lights when you go into a dark area. Replace any light bulbs as soon as they burn out. Set up your furniture so you have a clear path. Avoid moving your furniture around. If any of your floors are uneven, fix them. If there are any pets around you, be aware of where they are. Review your medicines with your doctor. Some medicines can make you feel dizzy. This can increase your chance of falling. Ask your doctor what other things that you can do to help prevent falls. This information is not intended to replace advice given to you by your health care provider. Make sure you discuss any questions you have with your health care provider. Document Released: 10/03/2009 Document Revised: 05/14/2016 Document Reviewed: 01/11/2015 Elsevier Interactive Patient Education  2017 [employer].

## 2022-09-01 NOTE — Progress Notes (Signed)
I connected with  Charles [name] today via telehealth video enabled device and verified that I am speaking with the correct person using two identifiers.   Location: Patient: home Provider: work  Persons participating in virtual visit: Elisha Ponder LPN, Laban Orourke  I discussed the limitations, risks, security and privacy concerns of performing an evaluation and management service by video and the availability of in person appointments. The patient expressed understanding and agreed to proceed.   Some vital signs may be absent or patient reported.      Subjective:   Charles [name] is a 71 y.o. male who presents for Medicare Annual/Subsequent preventive examination.  Review of Systems     Cardiac Risk Factors include: advanced age (>66men, >30 women);diabetes mellitus;dyslipidemia;hypertension;obesity (BMI >30kg/m2)     Objective:    Today's Vitals   09/01/22 1003  Weight: 260 lb (117.9 kg)  Height: 5\' 6"  (1.676 m)   Body mass index is 41.97 kg/m.     09/01/2022   10:18 AM 05/25/2022    1:27 PM 12/01/2021    2:00 PM 08/20/2021   10:27 AM 05/17/2020   10:51 AM 11/27/2019    8:41 PM 02/04/2018   11:39 AM  Advanced Directives  Does Patient Have a Medical Advance Directive? No No No No No No No  Would patient like information on creating a medical advance directive?  No - Patient declined Yes (ED - Information included in AVS) No - Patient declined  Yes (MAU/Ambulatory/Procedural Areas - Information given)     Current Medications (verified) Outpatient Encounter Medications as of 09/01/2022  Medication Sig   Accu-Chek FastClix Lancets MISC USE TO TEST BLOOD SUGARS TWO TIMES DAILY.   albuterol (VENTOLIN HFA) 108 (90 Base) MCG/ACT inhaler Inhale 2 puffs into the lungs every 6 (six) hours as needed for wheezing or shortness of breath.   Alcohol Swabs (B-D SINGLE USE SWABS REGULAR) PADS 1 Device by Does not apply route daily.   allopurinol (ZYLOPRIM) 300 MG tablet TAKE 1  TABLET EVERY DAY (Patient taking differently: Take 300 mg by mouth daily.)   amLODipine (NORVASC) 10 MG tablet TAKE 1 TABLET EVERY DAY (Patient taking differently: Take 10 mg by mouth daily.)   aspirin 81 MG tablet Take 81 mg by mouth every evening.    glimepiride (AMARYL) 4 MG tablet 1 tab 10-15 min before breakfast. (Patient taking differently: Take 4 mg by mouth daily with breakfast.)   glucose blood (ACCU-CHEK GUIDE) test strip TEST BLOOD SUGAR 1-2 TIMES DAILY   irbesartan (AVAPRO) 75 MG tablet TAKE 1 TABLET EVERY DAY (Patient taking differently: Take 75 mg by mouth daily.)   metFORMIN (GLUCOPHAGE) 500 MG tablet Take 500 mg by mouth 2 (two) times daily with a meal.   metoprolol succinate (TOPROL-XL) 50 MG 24 hr tablet TAKE 1 TABLET EVERY DAY. TAKE WITH OR IMMEDIATELY FOLLOWING A MEAL (DOSE INCREASE)   potassium chloride SA (KLOR-CON M) 20 MEQ tablet TAKE 1 TABLET EVERY DAY (Patient taking differently: Take 20 mEq by mouth daily.)   rosuvastatin (CRESTOR) 10 MG tablet TAKE 1 TABLET EVERY DAY   Empagliflozin-metFORMIN HCl 12.5-500 MG TABS Take 1 tablet by mouth 2 (two) times daily with a meal. (Patient not taking: Reported on 05/26/2022)   lidocaine (LIDODERM) 5 % Place 1 patch onto the skin daily. Remove & Discard patch within 12 hours or as directed by MD (Patient not taking: Reported on 09/01/2022)   methocarbamol (ROBAXIN) 500 MG tablet Take 1 tablet (500 mg total)  by mouth every 8 (eight) hours as needed for muscle spasms. (Patient not taking: Reported on 09/01/2022)   naproxen (NAPROSYN) 500 MG tablet Take 1 tablet (500 mg total) by mouth 2 (two) times daily. (Patient not taking: Reported on 09/01/2022)   Zoster Vaccine Adjuvanted [facility]) injection 0.5 ml in muscle and repeat in 8 weeks   No facility-administered encounter medications on file as of 09/01/2022.    Allergies (verified) Penicillins   History: Past Medical History:  Diagnosis Date   Abnormal carotid ultrasound 07/2015    mild bilateral stenosis, repeat in 1 year.  Dr. Rennis Golden   Diabetes mellitus without complication (HCC)    Gout    Hepatitis C antibody test positive 05/2015   prior infection, resolved   History of cardiovascular stress test 9/16   Lexiscan, EF 55%, no ischemia, Dr. Rennis Golden   Hyperlipidemia    Hypertension    Noncompliance    Obesity    PVD (peripheral vascular disease) (HCC) 07/2015   normal ABIs, Dr. Rennis Golden   Past Surgical History:  Procedure Laterality Date   BALLOON ANGIOPLASTY, ARTERY N/A also placed stent   COLONOSCOPY  2016   Dr. Elnoria Howard   Family History  Problem Relation Age of Onset   Asthma Mother    Heart attack Mother    Hypertension Mother    Hyperlipidemia Mother    Hypertension Father    Diabetes Father    HIV Sister    Liver disease Brother    Lung cancer Brother    Hypertension Brother    Social History   Socioeconomic History   Marital status: Divorced    Spouse name: Not on file   Number of children: Not on file   Years of education: Not on file   Highest education level: Not on file  Occupational History   Not on file  Tobacco Use   Smoking status: Former    Packs/day: 1.00    Years: 30.00    Total pack years: 30.00    Types: Cigarettes    Quit date: 07/31/1995    Years since quitting: 27.1   Smokeless tobacco: Never  Vaping Use   Vaping Use: Never used  Substance and Sexual Activity   Alcohol use: No    Alcohol/week: 0.0 standard drinks of alcohol   Drug use: No   Sexual activity: Not on file  Other Topics Concern   Not on file  Social History Narrative   Not on file   Social Determinants of Health   Financial Resource Strain: Low Risk  (09/01/2022)   Overall Financial Resource Strain (CARDIA)    Difficulty of Paying Living Expenses: Not hard at all  Food Insecurity: No Food Insecurity (09/01/2022)   Hunger Vital Sign    Worried About Running Out of Food in the Last Year: Never true    Ran Out of Food in the Last Year: Never true   Transportation Needs: No Transportation Needs (09/01/2022)   PRAPARE - Administrator, Civil Service (Medical): No    Lack of Transportation (Non-Medical): No  Physical Activity: Inactive (09/01/2022)   Exercise Vital Sign    Days of Exercise per Week: 0 days    Minutes of Exercise per Session: 0 min  Stress: No Stress Concern Present (09/01/2022)   [employer] of Occupational Health - Occupational Stress Questionnaire    Feeling of Stress : Not at all  Social Connections: Socially Isolated (08/20/2021)   Social Connection and Isolation Panel [NHANES]  Frequency of Communication with Friends and Family: Once a week    Frequency of Social Gatherings with Friends and Family: Once a week    Attends Religious Services: Never    Database administratorActive Member of Clubs or Organizations: No    Attends Engineer, structuralClub or Organization Meetings: Never    Marital Status: Divorced    Tobacco Counseling Counseling given: Not Answered   Clinical Intake:  Pre-visit preparation completed: Yes  Pain : No/denies pain     Nutritional Status: BMI > 30  Obese Nutritional Risks: Nausea/ vomitting/ diarrhea (diarrhea due to metformin) Diabetes: Yes  How often do you need to have someone help you when you read instructions, pamphlets, or other written materials from your doctor or pharmacy?: 1 - Never What is the last grade level you completed in school?: 10th grade  Diabetic? Yes Nutrition Risk Assessment:  Has the patient had any N/V/D within the last 2 months?  Yes  Does the patient have any non-healing wounds?  No  Has the patient had any unintentional weight loss or weight gain?  No   Diabetes:  Is the patient diabetic?  Yes  If diabetic, was a CBG obtained today?  No  Did the patient bring in their glucometer from home?  No  How often do you monitor your CBG's? daily.   Financial Strains and Diabetes Management:  Are you having any financial strains with the device, your supplies or your  medication? No .  Does the patient want to be seen by Chronic Care Management for management of their diabetes?  No  Would the patient like to be referred to a Nutritionist or for Diabetic Management?  No   Diabetic Exams:  Diabetic Eye Exam: Completed 03/26/2022 Diabetic Foot Exam: Completed 05/04/2022   Interpreter Needed?: No  Information entered by :: NAllen LPN   Activities of Daily Living    09/01/2022   10:27 AM  In your present state of health, do you have any difficulty performing the following activities:  Hearing? 1  Vision? 1  Comment blurry vision right eye  Difficulty concentrating or making decisions? 0  Walking or climbing stairs? 0  Dressing or bathing? 0  Doing errands, shopping? 0  Preparing Food and eating ? N  Using the Toilet? N  In the past six months, have you accidently leaked urine? Y  Do you have problems with loss of bowel control? N  Managing your Medications? N  Managing your Finances? N  Housekeeping or managing your Housekeeping? N    Patient Care Team: [place], [name], MD as PCP - General (Family Medicine) Rennis GoldenHilty, Lisette AbuKenneth C, MD as PCP - Cardiology (Cardiology) Rennis GoldenHilty, Lisette AbuKenneth C, MD as Consulting Physician (Cardiology)  Indicate any recent Medical Services you may have received from other than [facility] providers in the past year (date may be approximate).     Assessment:   This is a routine wellness examination for Charles [name].  Hearing/Vision screen Vision Screening - Comments:: [facility]   Dietary issues and exercise activities discussed: Current Exercise Habits: The patient does not participate in regular exercise at present   Goals Addressed             This Visit's Progress    Patient Stated       09/01/2022, wants to lose weight and wants to find another medicine for diabetes       Depression Screen    09/01/2022   10:25 AM 05/04/2022  8:48 AM 09/23/2021   12:45 PM 08/20/2021   10:24 AM  09/10/2020   11:18 AM 11/21/2019    9:31 AM 10/07/2018    8:37 AM  PHQ 2/9 Scores  PHQ - 2 Score 0 3 0 1 0 0 0  PHQ- 9 Score  8         Fall Risk    09/01/2022   10:25 AM 05/04/2022    8:48 AM 09/23/2021   12:45 PM 08/20/2021   10:30 AM 09/10/2020   11:18 AM  Fall Risk   Falls in the past year? 0 0 0 0 0  Number falls in past yr: 0 0 0 0 0  Injury with Fall? 0 0 0 0 0  Risk for fall due to : Medication side effect No Fall Risks  Impaired vision;Impaired balance/gait   Follow up Falls prevention discussed;Education provided;Falls evaluation completed Falls evaluation completed Education provided Falls prevention discussed Education provided    FALL RISK PREVENTION PERTAINING TO THE HOME:  Any stairs in or around the home? No  If so, are there any without handrails? N/a Home free of loose throw rugs in walkways, pet beds, electrical cords, etc? Yes  Adequate lighting in your home to reduce risk of falls? Yes   ASSISTIVE DEVICES UTILIZED TO PREVENT FALLS:  Life alert? No  Use of a cane, walker or w/c? No  Grab bars in the bathroom? No  Shower chair or bench in shower? No  Elevated toilet seat or a handicapped toilet? No   TIMED UP AND GO:  Was the test performed? No .      Cognitive Function:        09/01/2022   10:35 AM 08/20/2021   10:33 AM  6CIT Screen  What Year? 0 points 0 points  What month? 0 points 0 points  What time? 0 points 0 points  Count back from 20 0 points 0 points  Months in reverse 0 points 0 points  Repeat phrase 2 points 0 points  Total Score 2 points 0 points    Immunizations Immunization History  Administered Date(s) Administered   Fluad Quad(high Dose 65+) 09/11/2019, 09/03/2020   Hepatitis A, Adult 01/18/2015   Hepatitis B, adult 01/18/2015, 04/18/2015   Influenza, High Dose Seasonal PF 10/07/2018   Influenza,inj,Quad PF,6+ Mos 10/09/2014, 11/29/2015   Influenza-Unspecified 09/09/2021   PFIZER Comirnaty(Gray Top)Covid-19 Tri-Sucrose  Vaccine 04/02/2021   PFIZER(Purple Top)SARS-COV-2 Vaccination 01/27/2020, 02/17/2020, 09/16/2020   Pneumococcal Conjugate-13 06/06/2018   Pneumococcal Polysaccharide-23 11/06/2016    TDAP status: Due, Education has been provided regarding the importance of this vaccine. Advised may receive this vaccine at local pharmacy or Health Dept. Aware to provide a copy of the vaccination record if obtained from local pharmacy or Health Dept. Verbalized acceptance and understanding.  Flu Vaccine status: Due, Education has been provided regarding the importance of this vaccine. Advised may receive this vaccine at local pharmacy or Health Dept. Aware to provide a copy of the vaccination record if obtained from local pharmacy or Health Dept. Verbalized acceptance and understanding.  Pneumococcal vaccine status: Up to date  Covid-19 vaccine status: Completed vaccines  Qualifies for Shingles Vaccine? Yes   Zostavax completed No   Shingrix Completed?: No.    Education has been provided regarding the importance of this vaccine. Patient has been advised to call insurance company to determine out of pocket expense if they have not yet received this vaccine. Advised may also receive vaccine at local pharmacy or Health  Dept. Verbalized acceptance and understanding.  Screening Tests Health Maintenance  Topic Date Due   Zoster Vaccines- Shingrix (1 of 2) Never done   COVID-19 Vaccine (5 - Pfizer series) 05/28/2021   Pneumonia Vaccine 53+ Years old (3 - PPSV23 or PCV20) 11/06/2021   INFLUENZA VACCINE  07/21/2022   HEMOGLOBIN A1C  11/04/2022   OPHTHALMOLOGY EXAM  03/27/2023   FOOT EXAM  05/05/2023   COLONOSCOPY (Pts 45-28yrs Insurance coverage will need to be confirmed)  07/30/2025   Hepatitis C Screening  Completed   HPV VACCINES  Aged Out   TETANUS/TDAP  Discontinued   Fecal DNA (Cologuard)  Discontinued    Health Maintenance  Health Maintenance Due  Topic Date Due   Zoster Vaccines- Shingrix (1 of 2)  Never done   COVID-19 Vaccine (5 - Pfizer series) 05/28/2021   Pneumonia Vaccine 26+ Years old (3 - PPSV23 or PCV20) 11/06/2021   INFLUENZA VACCINE  07/21/2022    Colorectal cancer screening: Type of screening: Colonoscopy. Completed 07/30/2022. Repeat every 3 years  Lung Cancer Screening: (Low Dose CT Chest recommended if Age 48-80 years, 30 pack-year currently smoking OR have quit w/in 15years.) does not qualify.   Lung Cancer Screening Referral: no  Additional Screening:  Hepatitis C Screening: does qualify; Completed 01/18/2015  Vision Screening: Recommended annual ophthalmology exams for early detection of glaucoma and other disorders of the eye. Is the patient up to date with their annual eye exam?  Yes  Who is the provider or what is the name of the office in which the patient attends annual eye exams? Dr. Emily Filbert If pt is not established with a provider, would they like to be referred to a provider to establish care? No .   Dental Screening: Recommended annual dental exams for proper oral hygiene  Community Resource Referral / Chronic Care Management: CRR required this visit?  No   CCM required this visit?  No      Plan:     I have personally reviewed and noted the following in the patient's chart:   Medical and social history Use of alcohol, tobacco or illicit drugs  Current medications and supplements including opioid prescriptions. Patient is not currently taking opioid prescriptions. Functional ability and status Nutritional status Physical activity Advanced directives List of other physicians Hospitalizations, surgeries, and ER visits in previous 12 months Vitals Screenings to include cognitive, depression, and falls Referrals and appointments  In addition, I have reviewed and discussed with patient certain preventive protocols, quality metrics, and best practice recommendations. A written personalized care plan for preventive services as well as general  preventive health recommendations were provided to patient.     Barb Merino, LPN   05/07/6159   Nurse Notes: none  Due to this being a virtual visit, the after visit summary with patients personalized plan was offered to patient via mail or my-chart.  Patient would like to access on my-chart

## 2022-09-01 NOTE — Telephone Encounter (Signed)
   Telephone encounter was:  Successful.  09/01/2022 Name: Charles [name] MRN: [id number] DOB: 1951/11/28  Charles [name] is a 71 y.o. year old male who is a primary care patient of [place], Timoteo Expose, MD . The community resource team was consulted for assistance with Financial Difficulties related to bills  Care guide performed the following interventions: Patient provided with information about care guide support team and interviewed to confirm resource needs. Patient advised he needs surgery which is too much, he also advised this is causing him a lot of stress and that it sounds like no one cares about his health or mental. CG will send a message for SW to see if patient qualifies for [facility] Giving Fund. CG will also send patient financial resources to his address on file.  Follow Up Plan:  Care guide will outreach resources to assist patient with financial needs  [facility] Guide, Embedded Care Coordination [facility]  [place], [place] [address]  Main Phone: [phone]  E-mail: [email or web address]  Website: [email or web address]

## 2022-09-02 ENCOUNTER — Telehealth: Payer: Self-pay | Admitting: *Deleted

## 2022-09-02 NOTE — Chronic Care Management (AMB) (Signed)
  Care Coordination   Note   09/02/2022 Name: DONTERRIUS SANTUCCI MRN: [id number] DOB: 1951/10/12  JAVIS ABBOUD is a 71 y.o. year old male who sees [place], Timoteo Expose, MD for primary care. I reached out to [name] by phone today to offer care coordination services.  Mr. Cocuzza was given information about Care Coordination services today including:   The Care Coordination services include support from the care team which includes your Nurse Coordinator, Clinical Social Worker, or Pharmacist.  The Care Coordination team is here to help remove barriers to the health concerns and goals most important to you. Care Coordination services are voluntary, and the patient may decline or stop services at any time by request to their care team member.   Care Coordination Consent Status: Patient agreed to services and verbal consent obtained.   Follow up plan:  Telephone appointment with care coordination team member scheduled for:  09/07/22 with Enrique Sack BSW  Encounter Outcome:  Pt. Scheduled  [facility] Coordination Care Guide  Direct Dial: [phone]

## 2022-09-02 NOTE — Chronic Care Management (AMB) (Signed)
  Care Coordination  Outreach Note  09/02/2022 Name: ALLARD LIGHTSEY MRN: [id number] DOB: 12/11/1951   Care Coordination Outreach Attempts: An unsuccessful telephone outreach was attempted today to offer the patient information about available care coordination services as a benefit of their health plan.   Follow Up Plan:  Additional outreach attempts will be made to offer the patient care coordination information and services.   Encounter Outcome:  No Answer  Gwenevere Ghazi  Care Coordination Care Guide  Direct Dial: [phone]

## 2022-09-02 NOTE — Progress Notes (Unsigned)
HPI: [name] is a 71 y.o. male, who is here today for chronic disease management.  Last seen on 05/04/22. Since his last visit, he was evaluated in the ED for neck pain, improved.  Hyperlipidemia: Currently on Crestor 10 mg daily. He has tolerated medication well. Lab Results  Component Value Date   CHOL 124 09/23/2021   HDL 39.10 09/23/2021   LDLCALC 70 09/23/2021   TRIG 74.0 09/23/2021   CHOLHDL 3 09/23/2021   Hypertension:  Medications:Avapro 75 mg daily, Metoprolol succinate 50 mg daily,and Amlodipine 10 mg daily. BP readings at home:Not checking. Side effects:None. Negative for unusual or severe headache, visual changes, exertional chest pain, dyspnea,  focal weakness, or worsening edema. Lab Results  Component Value Date   CREATININE 1.00 05/25/2022   BUN 10 05/25/2022   NA 138 05/25/2022   K 3.2 (L) 05/25/2022   CL 104 05/25/2022   CO2 23 05/25/2022  HypoK+:He does take KLOR frequently because size of pill.  Diabetes Mellitus II: Dx'ed in 2003. - Checking BG at home: "Up and down." - Medications: Metformin 500 mg bid and Glimepiride 4 mg daily. Hx of chronic diarrhea. Last visit Empagliflozin-metformin was recommenced but he could not afford it. Trulicity and Ozempic have been prescribed before but hight co-pay. He is very frustrated, he is not sure which medications are covered under his plan. - Diet: Has decreased portions size, wt is stable. He acknowledges he eats a lot of sweets, candy, and bread. - Exercise: Not consistently. - eye exam: 03/2022, right eye cataract. - foot exam: 04/2022. - Negative for symptoms of hypoglycemia, polyuria, polydipsia, numbness extremities, foot ulcers/trauma.  Lab Results  Component Value Date   HGBA1C 8.5 (A) 05/04/2022   Lab Results  Component Value Date   MICROALBUR 4.6 (H) 09/23/2021   He has had diarrhea for a few years, since 2019, loose and watery stools, 5-6. Negative for blood or mucus. Seldom abdominal  cramps. He has tried Doxepin. Aggravated sometimes by food intake. Has not identified alleviating factors. In 01/2019 Metformin dose was decreased, no significant improvement. Colonoscopy on 07/30/22.  Review of Systems  Constitutional:  Positive for fatigue. Negative for activity change, appetite change and fever.  HENT:  Negative for nosebleeds, sore throat and trouble swallowing.   Respiratory:  Negative for cough and wheezing.   Cardiovascular:  Negative for palpitations.  Gastrointestinal:  Negative for nausea and vomiting.  Genitourinary:  Negative for decreased urine volume, dysuria and hematuria.  Musculoskeletal:  Positive for arthralgias.  Skin:  Negative for rash.  Neurological:  Negative for syncope, facial asymmetry and weakness.  Psychiatric/Behavioral:  Negative for confusion.   Rest of ROS see pertinent positives and negatives in HPI.  Current Outpatient Medications on File Prior to Visit  Medication Sig Dispense Refill   Accu-Chek FastClix Lancets MISC USE TO TEST BLOOD SUGARS TWO TIMES DAILY. 204 each 2   albuterol (VENTOLIN HFA) 108 (90 Base) MCG/ACT inhaler Inhale 2 puffs into the lungs every 6 (six) hours as needed for wheezing or shortness of breath. 8 g 1   Alcohol Swabs (B-D SINGLE USE SWABS REGULAR) PADS 1 Device by Does not apply route daily. 90 each 3   allopurinol (ZYLOPRIM) 300 MG tablet TAKE 1 TABLET EVERY DAY (Patient taking differently: Take 300 mg by mouth daily.) 90 tablet 1   amLODipine (NORVASC) 10 MG tablet TAKE 1 TABLET EVERY DAY (Patient taking differently: Take 10 mg by mouth daily.) 90 tablet 1   aspirin 81  MG tablet Take 81 mg by mouth every evening.      glimepiride (AMARYL) 4 MG tablet 1 tab 10-15 min before breakfast. (Patient taking differently: Take 4 mg by mouth daily with breakfast.) 90 tablet 1   glucose blood (ACCU-CHEK GUIDE) test strip TEST BLOOD SUGAR 1-2 TIMES DAILY 200 strip 1   irbesartan (AVAPRO) 75 MG tablet TAKE 1 TABLET EVERY  DAY (Patient taking differently: Take 75 mg by mouth daily.) 90 tablet 3   metoprolol succinate (TOPROL-XL) 50 MG 24 hr tablet TAKE 1 TABLET EVERY DAY. TAKE WITH OR IMMEDIATELY FOLLOWING A MEAL (DOSE INCREASE) 90 tablet 1   potassium chloride SA (KLOR-CON M) 20 MEQ tablet TAKE 1 TABLET EVERY DAY (Patient taking differently: Take 20 mEq by mouth daily.) 90 tablet 2   rosuvastatin (CRESTOR) 10 MG tablet TAKE 1 TABLET EVERY DAY 90 tablet 0   Zoster Vaccine Adjuvanted [facility]) injection 0.5 ml in muscle and repeat in 8 weeks 0.5 mL 1   No current facility-administered medications on file prior to visit.   Past Medical History:  Diagnosis Date   Abnormal carotid ultrasound 07/2015   mild bilateral stenosis, repeat in 1 year.  Dr. Rennis [name]   Diabetes mellitus without complication (HCC)    Gout    Hepatitis C antibody test positive 05/2015   prior infection, resolved   History of cardiovascular stress test 9/16   Lexiscan, EF 55%, no ischemia, Dr. Rennis [name]   Hyperlipidemia    Hypertension    Noncompliance    Obesity    PVD (peripheral vascular disease) (HCC) 07/2015   normal ABIs, Dr. Rennis [name]   Allergies  Allergen Reactions   Penicillins Other (See Comments)    Per pt blacked out after injection as a child.  Did it involve swelling of the face/tongue/throat, SOB, or low BP? N Did it involve sudden or severe rash/hives, skin peeling, or any reaction on the inside of your mouth or nose? N Did you need to seek medical attention at a hospital or doctor's office? N When did it last happen?   Several Decades Ago    If all above answers are "NO", may proceed with cephalosporin use.    Social History   Socioeconomic History   Marital status: Divorced    Spouse name: Not on file   Number of children: Not on file   Years of education: Not on file   Highest education level: Not on file  Occupational History   Not on file  Tobacco Use   Smoking status: Former    Packs/day: 1.00    Years: 30.00     Total pack years: 30.00    Types: Cigarettes    Quit date: 07/31/1995    Years since quitting: 27.1   Smokeless tobacco: Never  Vaping Use   Vaping Use: Never used  Substance and Sexual Activity   Alcohol use: No    Alcohol/week: 0.0 standard drinks of alcohol   Drug use: No   Sexual activity: Not on file  Other Topics Concern   Not on file  Social History Narrative   Not on file   Social Determinants of Health   Financial Resource Strain: Low Risk  (09/01/2022)   Overall Financial Resource Strain (CARDIA)    Difficulty of Paying Living Expenses: Not hard at all  Food Insecurity: No Food Insecurity (09/01/2022)   Hunger Vital Sign    Worried About Running Out of Food in the Last Year: Never true    Ran  Out of Food in the Last Year: Never true  Transportation Needs: No Transportation Needs (09/01/2022)   PRAPARE - Administrator, Civil Service (Medical): No    Lack of Transportation (Non-Medical): No  Physical Activity: Inactive (09/01/2022)   Exercise Vital Sign    Days of Exercise per Week: 0 days    Minutes of Exercise per Session: 0 min  Stress: No Stress Concern Present (09/01/2022)   [employer] of Occupational Health - Occupational Stress Questionnaire    Feeling of Stress : Not at all  Social Connections: Socially Isolated (08/20/2021)   Social Connection and Isolation Panel [NHANES]    Frequency of Communication with Friends and Family: Once a week    Frequency of Social Gatherings with Friends and Family: Once a week    Attends Religious Services: Never    Database administrator or Organizations: No    Attends Banker Meetings: Never    Marital Status: Divorced   Vitals:   09/04/22 0831  BP: 120/70  Pulse: 100  Resp: 16  SpO2: 97%   Wt Readings from Last 3 Encounters:  09/04/22 260 lb 2 oz (118 kg)  09/01/22 260 lb (117.9 kg)  05/25/22 260 lb (117.9 kg)  Body mass index is 41.99 kg/m.  Physical Exam Vitals and nursing  note reviewed.  Constitutional:      General: He is not in acute distress.    Appearance: He is well-developed.  HENT:     Head: Normocephalic and atraumatic.     Mouth/Throat:     Mouth: Mucous membranes are moist.  Eyes:     Conjunctiva/sclera: Conjunctivae normal.  Cardiovascular:     Rate and Rhythm: Normal rate and regular rhythm.     Pulses:          Dorsalis pedis pulses are 2+ on the right side and 2+ on the left side.     Heart sounds: No murmur heard.    Comments: Trace pitting edema LE's, bilateral. Pulmonary:     Effort: Pulmonary effort is normal. No respiratory distress.     Breath sounds: Normal breath sounds.  Abdominal:     Palpations: Abdomen is soft. There is no hepatomegaly or mass.     Tenderness: There is no abdominal tenderness.  Lymphadenopathy:     Cervical: No cervical adenopathy.  Skin:    General: Skin is warm.     Findings: No erythema or rash.  Neurological:     Mental Status: He is alert and oriented to person, place, and time.     Cranial Nerves: No cranial nerve deficit.     Gait: Gait normal.  Psychiatric:        Mood and Affect: Affect normal. Mood is anxious.   ASSESSMENT AND PLAN:  [name] was seen today for follow-up.  Diagnoses and all orders for this visit: Orders Placed This Encounter  Procedures   Flu Vaccine QUAD High Dose(Fluad)   Lipid panel   Microalbumin / creatinine urine ratio   Basic metabolic panel   POC HgB A1c   Lab Results  Component Value Date   HGBA1C 8.5 (A) 09/04/2022   Lab Results  Component Value Date   MICROALBUR 3.1 (H) 09/04/2022   MICROALBUR 4.6 (H) 09/23/2021   Lab Results  Component Value Date   CHOL 112 09/04/2022   HDL 39.10 09/04/2022   LDLCALC 57 09/04/2022   TRIG 79.0 09/04/2022   CHOLHDL 3 09/04/2022   Lab Results  Component Value Date   CREATININE 1.16 09/04/2022   BUN 12 09/04/2022   NA 137 09/04/2022   K 3.5 09/04/2022   CL 101 09/04/2022   CO2 26 09/04/2022   Morbid  obesity (HCC) We discussed benefits of wt loss as well as adverse effects of obesity.  Consistency with healthy diet and low impact physical activity as tolerated encouraged.  Diarrhea, unspecified type Chronic. Metformin can defectively be causing problem, so discontinued. Adequate hydration to continue.  Essential hypertension BP adequately controlled. Continue current management: Amlodipine 10 mg, Avapro 75 mg daily, and Metoprolol succinate 50 mg daily. DASH/low salt diet recommended. Monitor BP at home. Eye exam is current.  Type 2 diabetes mellitus with vascular disease (HCC) HgA1C is not at goal. Several medications I have recommended are not well covered by his health insurance.  He does not want insulin. Recommend trying to apply for pt assistance. Metformin discontinued due to diarrhea. Jardiance 25 mg and januvia 50 mg sent to his pharmacy. No charges in Glimepiride dose. Regular exercise and healthy diet with avoidance of added sugar food intake is an important part of treatment and recommended. Annual eye exam, periodic dental and foot care recommended. F/U in 3-4 months.  Hyperlipidemia Continue Rosuvastatin 10 mg daily. Low fat diet also recommended. Further recommendations according to FLP result.  Hypokalemia He does not want to try KLOR powder. Continue KLOR 20 meq daily. Chronic diarrhea could be aggravating problem.  Need for influenza vaccination -     Flu Vaccine QUAD High Dose(Fluad)  I spent a total of 44 minutes in both face to face and non face to face activities for this visit on the date of this encounter. During this time history was obtained and documented, examination was performed, prior labs/imaging reviewed, and assessment/plan discussed. He is meeting with social worker Monday to discuss financial options to help with medications. Return in about 15 weeks (around 12/18/2022).  Shama Monfils G. [place], MD  [facility]. [place]  office.

## 2022-09-04 ENCOUNTER — Encounter: Payer: Self-pay | Admitting: Family Medicine

## 2022-09-04 ENCOUNTER — Ambulatory Visit (INDEPENDENT_AMBULATORY_CARE_PROVIDER_SITE_OTHER): Payer: Medicare PPO | Admitting: Family Medicine

## 2022-09-04 VITALS — BP 120/70 | HR 100 | Resp 16 | Ht 66.0 in | Wt 260.1 lb

## 2022-09-04 DIAGNOSIS — E1159 Type 2 diabetes mellitus with other circulatory complications: Secondary | ICD-10-CM | POA: Diagnosis not present

## 2022-09-04 DIAGNOSIS — R197 Diarrhea, unspecified: Secondary | ICD-10-CM | POA: Diagnosis not present

## 2022-09-04 DIAGNOSIS — I10 Essential (primary) hypertension: Secondary | ICD-10-CM

## 2022-09-04 DIAGNOSIS — E876 Hypokalemia: Secondary | ICD-10-CM | POA: Diagnosis not present

## 2022-09-04 DIAGNOSIS — E785 Hyperlipidemia, unspecified: Secondary | ICD-10-CM | POA: Diagnosis not present

## 2022-09-04 DIAGNOSIS — Z23 Encounter for immunization: Secondary | ICD-10-CM

## 2022-09-04 LAB — POCT GLYCOSYLATED HEMOGLOBIN (HGB A1C): Hemoglobin A1C: 8.5 % — AB (ref 4.0–5.6)

## 2022-09-04 LAB — BASIC METABOLIC PANEL
BUN: 12 mg/dL (ref 6–23)
CO2: 26 mEq/L (ref 19–32)
Calcium: 9.4 mg/dL (ref 8.4–10.5)
Chloride: 101 mEq/L (ref 96–112)
Creatinine, Ser: 1.16 mg/dL (ref 0.40–1.50)
GFR: 63.74 mL/min (ref >60.00)
Glucose, Bld: 172 mg/dL — ABNORMAL HIGH (ref 70–99)
Potassium: 3.5 mEq/L (ref 3.5–5.1)
Sodium: 137 mEq/L (ref 135–145)

## 2022-09-04 LAB — LIPID PANEL
Cholesterol: 112 mg/dL (ref 0–200)
HDL: 39.1 mg/dL (ref >39.00)
LDL Cholesterol: 57 mg/dL (ref 0–99)
NonHDL: 73.01
Total CHOL/HDL Ratio: 3
Triglycerides: 79 mg/dL (ref 0.0–149.0)
VLDL: 15.8 mg/dL (ref 0.0–40.0)

## 2022-09-04 LAB — MICROALBUMIN / CREATININE URINE RATIO
Creatinine,U: 212.3 mg/dL
Microalb Creat Ratio: 1.5 mg/g (ref 0.0–30.0)
Microalb, Ur: 3.1 mg/dL — ABNORMAL HIGH (ref 0.0–1.9)

## 2022-09-04 MED ORDER — EMPAGLIFLOZIN 25 MG PO TABS: 25.0000 mg | ORAL_TABLET | Freq: Every day | ORAL | 3 refills | Status: DC

## 2022-09-04 MED ORDER — SITAGLIPTIN PHOSPHATE 50 MG PO TABS: 50.0000 mg | ORAL_TABLET | Freq: Every day | ORAL | 3 refills | Status: DC

## 2022-09-04 NOTE — Assessment & Plan Note (Signed)
BP adequately controlled. Continue current management: Amlodipine 10 mg, Avapro 75 mg daily, and Metoprolol succinate 50 mg daily. DASH/low salt diet recommended. Monitor BP at home. Eye exam is current.

## 2022-09-04 NOTE — Assessment & Plan Note (Signed)
Continue Rosuvastatin 10 mg daily. Low fat diet also recommended. Further recommendations according to FLP result.

## 2022-09-04 NOTE — Assessment & Plan Note (Addendum)
HgA1C is not at goal. Several medications I have recommended are not well covered by his health insurance.  He does not want insulin. Recommend trying to apply for pt assistance. Metformin discontinued due to diarrhea. Jardiance 25 mg and januvia 50 mg sent to his pharmacy. No charges in Glipizide dose. Regular exercise and healthy diet with avoidance of added sugar food intake is an important part of treatment and recommended. Annual eye exam, periodic dental and foot care recommended. F/U in 3-4 months.

## 2022-09-04 NOTE — Patient Instructions (Addendum)
A few things to remember from today's visit:  Type 2 diabetes mellitus with vascular disease (HCC) - Plan: POC HgB A1c, Microalbumin / creatinine urine ratio, empagliflozin (JARDIANCE) 25 MG TABS tablet, sitaGLIPtin (JANUVIA) 50 MG tablet  Essential hypertension - Plan: Basic metabolic panel  Hyperlipidemia, unspecified hyperlipidemia type - Plan: Lipid panel  Need for influenza vaccination - Plan: Flu Vaccine QUAD High Dose(Fluad)  If you need refills for medications you take chronically, please call your pharmacy. Do not use My Chart to request refills or for acute issues that need immediate attention. If you send a my chart message, it may take a few days to be addressed, specially if I am not in the office.  Please be sure medication list is accurate. If a new problem present, please set up appointment sooner than planned today.  Today Januvia and Jardiance addend for diabetes. You can try going to the web side of Trulicity or Ozempic  and apply for patent assistance.  Stop candy intake and decrease bread intake.  Diabetes Mellitus and Nutrition, Adult When you have diabetes, or diabetes mellitus, it is very important to have healthy eating habits because your blood sugar (glucose) levels are greatly affected by what you eat and drink. Eating healthy foods in the right amounts, at about the same times every day, can help you: Manage your blood glucose. Lower your risk of heart disease. Improve your blood pressure. Reach or maintain a healthy weight. What can affect my meal plan? Every person with diabetes is different, and each person has different needs for a meal plan. Your health care provider may recommend that you work with a dietitian to make a meal plan that is best for you. Your meal plan may vary depending on factors such as: The calories you need. The medicines you take. Your weight. Your blood glucose, blood pressure, and cholesterol levels. Your activity  level. Other health conditions you have, such as heart or kidney disease. How do carbohydrates affect me? Carbohydrates, also called carbs, affect your blood glucose level more than any other type of food. Eating carbs raises the amount of glucose in your blood. It is important to know how many carbs you can safely have in each meal. This is different for every person. Your dietitian can help you calculate how many carbs you should have at each meal and for each snack. How does alcohol affect me? Alcohol can cause a decrease in blood glucose (hypoglycemia), especially if you use insulin or take certain diabetes medicines by mouth. Hypoglycemia can be a life-threatening condition. Symptoms of hypoglycemia, such as sleepiness, dizziness, and confusion, are similar to symptoms of having too much alcohol. Do not drink alcohol if: Your health care provider tells you not to drink. You are pregnant, may be pregnant, or are planning to become pregnant. If you drink alcohol: Limit how much you have to: 0-1 drink a day for women. 0-2 drinks a day for men. Know how much alcohol is in your drink. In the [place] one drink equals one 12 oz bottle of beer (355 mL), one 5 oz glass of wine (148 mL), or one 1 oz glass of hard liquor (44 mL). Keep yourself hydrated with water, diet soda, or unsweetened iced tea. Keep in mind that regular soda, juice, and other mixers may contain a lot of sugar and must be counted as carbs. What are tips for following this plan?  Reading food labels Start by checking the serving size on the Nutrition Facts  label of packaged foods and drinks. The number of calories and the amount of carbs, fats, and other nutrients listed on the label are based on one serving of the item. Many items contain more than one serving per package. Check the total grams (g) of carbs in one serving. Check the number of grams of saturated fats and trans fats in one serving. Choose foods that have a low amount  or none of these fats. Check the number of milligrams (mg) of salt (sodium) in one serving. Most people should limit total sodium intake to less than 2,300 mg per day. Always check the nutrition information of foods labeled as "low-fat" or "nonfat." These foods may be higher in added sugar or refined carbs and should be avoided. Talk to your dietitian to identify your daily goals for nutrients listed on the label. Shopping Avoid buying canned, pre-made, or processed foods. These foods tend to be high in fat, sodium, and added sugar. Shop around the outside edge of the grocery store. This is where you will most often find fresh fruits and vegetables, bulk grains, fresh meats, and fresh dairy products. Cooking Use low-heat cooking methods, such as baking, instead of high-heat cooking methods, such as deep frying. Cook using healthy oils, such as olive, canola, or sunflower oil. Avoid cooking with butter, cream, or high-fat meats. Meal planning Eat meals and snacks regularly, preferably at the same times every day. Avoid going long periods of time without eating. Eat foods that are high in fiber, such as fresh fruits, vegetables, beans, and whole grains. Eat 4-6 oz (112-168 g) of lean protein each day, such as lean meat, chicken, fish, eggs, or tofu. One ounce (oz) (28 g) of lean protein is equal to: 1 oz (28 g) of meat, chicken, or fish. 1 egg.  cup (62 g) of tofu. Eat some foods each day that contain healthy fats, such as avocado, nuts, seeds, and fish. What foods should I eat? Fruits Berries. Apples. Oranges. Peaches. Apricots. Plums. Grapes. Mangoes. Papayas. Pomegranates. Kiwi. Cherries. Vegetables Leafy greens, including lettuce, spinach, kale, chard, collard greens, mustard greens, and cabbage. Beets. Cauliflower. Broccoli. Carrots. Green beans. Tomatoes. Peppers. Onions. Cucumbers. Brussels sprouts. Grains Whole grains, such as whole-wheat or whole-grain bread, crackers, tortillas,  cereal, and pasta. Unsweetened oatmeal. Quinoa. Brown or wild rice. Meats and other proteins Seafood. Poultry without skin. Lean cuts of poultry and beef. Tofu. Nuts. Seeds. Dairy Low-fat or fat-free dairy products such as milk, yogurt, and cheese. The items listed above may not be a complete list of foods and beverages you can eat and drink. Contact a dietitian for more information. What foods should I avoid? Fruits Fruits canned with syrup. Vegetables Canned vegetables. Frozen vegetables with butter or cream sauce. Grains Refined white flour and flour products such as bread, pasta, snack foods, and cereals. Avoid all processed foods. Meats and other proteins Fatty cuts of meat. Poultry with skin. Breaded or fried meats. Processed meat. Avoid saturated fats. Dairy Full-fat yogurt, cheese, or milk. Beverages Sweetened drinks, such as soda or iced tea. The items listed above may not be a complete list of foods and beverages you should avoid. Contact a dietitian for more information. Questions to ask a health care provider Do I need to meet with a certified diabetes care and education specialist? Do I need to meet with a dietitian? What number can I call if I have questions? When are the best times to check my blood glucose? Where to find more information:  American Diabetes Association: diabetes.org Academy of Nutrition and Dietetics: eatright.[employer] of Diabetes and Digestive and Kidney Diseases: StageSync.si Association of Diabetes Care & Education Specialists: diabeteseducator.org Summary It is important to have healthy eating habits because your blood sugar (glucose) levels are greatly affected by what you eat and drink. It is important to use alcohol carefully. A healthy meal plan will help you manage your blood glucose and lower your risk of heart disease. Your health care provider may recommend that you work with a dietitian to make a meal plan that is best for  you. This information is not intended to replace advice given to you by your health care provider. Make sure you discuss any questions you have with your health care provider. Document Revised: 07/10/2020 Document Reviewed: 07/10/2020 Elsevier Patient Education  2023 [employer].

## 2022-09-07 ENCOUNTER — Ambulatory Visit: Payer: Self-pay

## 2022-09-07 NOTE — Patient Instructions (Signed)
Visit Information  Thank you for taking time to visit with me today. Please don't hesitate to contact me if I can be of assistance to you.   Following are the goals we discussed today:   Goals Addressed             This Visit's Progress    COMPLETED: Care Coordination Activities       Care Coordination Interventions: Determined patient is trying to save money for a cataract surgery on his right eye which will require patient to pay $350-$400 up front in order to schedule procedure SDoH screening performed - patient does not own a car and utilizes [place] for transportation needs, patient has changed shopping habits at the grocery store to account for rising costs. Patient endorses receiving food resources in the past from a care guide but has chosen to change shopping habits instead of visiting a food pantry at this time Patient is currently living with his nephew and splitting the cost of rent - patient would like to move into low income senior housing  Confirmed patient is on the wait list for [address]. Patient also reports he is on about 7 senior apartment wait lists Discussed patient was recently switched from Metformin to [place] due to negative side effects. Patient is concerned he will not be able to afford medications due to his past experience with having to stop Trulicity due to high co-pay amounts Education provided on role of [place] team - patient is agreeable to pharmacy team referral for medication assistance related to [place] and [place] patient he will be contacted by a pharmacist for further assistance         Please call the care guide team at [phone] if you need to schedule an appointment with me  If you are experiencing a Mental Health or [place] or need someone to talk to, please call 1-800-273-TALK (toll free, 24 hour hotline)  Patient verbalizes understanding of instructions and care plan provided today and  agrees to view in [place]. Active MyChart status and patient understanding of how to access instructions and care plan via MyChart confirmed with patient.     No further follow up required: A member of the Pharmacy team will contact you.  Daneen Schick, BSW, CDP Social Worker, Certified Dementia Practitioner [facility]  Care Coordination [phone]

## 2022-09-07 NOTE — Patient Outreach (Signed)
  Care Coordination   Initial Visit Note   09/07/2022 Name: Charles [name] MRN: [id number] DOB: 12-06-1951  Charles [name] is a 71 y.o. year old male who sees Charles [name], Charles So, MD for primary care. I spoke with  Charles [name] by phone today.  What matters to the patients health and wellness today?  I have difficulty affording medications and medical co-pay amounts    Goals Addressed             This Visit's Progress    COMPLETED: Care Coordination Activities       Care Coordination Interventions: Determined patient is trying to save money for a cataract surgery on his right eye which will require patient to pay $350-$400 up front in order to schedule procedure SDoH screening performed - patient does not own a car and utilizes [place] for transportation needs, patient has changed shopping habits at the grocery store to account for rising costs. Patient endorses receiving food resources in the past from a care guide but has chosen to change shopping habits instead of visiting a food pantry at this time Patient is currently living with his nephew and splitting the cost of rent - patient would like to move into low income senior housing  Confirmed patient is on the wait list for [address] 8. Patient also reports he is on about 7 senior apartment wait lists Discussed patient was recently switched from Metformin to [place] due to negative side effects. Patient is concerned he will not be able to afford medications due to his past experience with having to stop Trulicity due to high co-pay amounts Education provided on role of [place] team - patient is agreeable to pharmacy team referral for medication assistance related to [place] and [place] patient he will be contacted by a pharmacist for further assistance         SDOH assessments and interventions completed:  Yes  SDOH Interventions Today    Flowsheet Row Most Recent Value  SDOH Interventions   Housing  Interventions Intervention Not Indicated  Transportation Interventions Intervention Not Indicated        Care Coordination Interventions Activated:  Yes  Care Coordination Interventions:  Yes, provided   Follow up plan: Referral made to [place]    Encounter Outcome:  Pt. Visit Completed   Daneen Schick, Arita Miss, CDP Social Worker, Certified Dementia Practitioner [place] Coordination [phone]

## 2022-09-08 ENCOUNTER — Telehealth: Payer: Self-pay

## 2022-09-08 NOTE — Progress Notes (Addendum)
Charles [name] [facility]) Care Management  [place]   09/08/2022  Charles [name] 1951-03-27 [id number]  Reason for referral: Medication Assistance  Referral source: [place] Management SW Referral medication(s): [place] Getting Current insurance:[employer] Medicare    Medication Review Findings:  Patient reports Jardiance and Januvia cost $500 from [place] [address] [employer]). Per discussion with patient, he is unable to afford medications and have tried alternatives for metformin but had to switch back d/t cost. He currently reports endorsing GI intolerances related to metformin. Based on reported income, he may qualify for medication assistance for [place]; he states that he will be able to provide letter of proof of income from social security. Informed patient it will take about four weeks for application to be approved and he will remain on metformin.   Medication Assistance Findings:  Medication assistance needs identified: Jardiance and Januvia    Plan: I will route patient assistance letter to [place] technician who will coordinate patient assistance program application process for medications listed above.  [facility] pharmacy technician will assist with obtaining all required documents from both patient and provider(s) and submit application(s) once completed.    Thank you for allowing pharmacy to be a part of this patient's care.  Kristeen Miss, PharmD Clinical Pharmacist [place] Cell: [phone]

## 2022-09-08 NOTE — Telephone Encounter (Signed)
Telephone encounter was:  Successful.  09/08/2022 Name: LILLARD BAILON MRN: [id number] DOB: Jan 09, 1951  OTHER ATIENZA is a 71 y.o. year old male who is a primary care patient of [place], Timoteo Expose, MD . The community resource team was consulted for assistance with Transportation Needs  and Financial Difficulties related to bills  Care guide performed the following interventions: Follow up call placed to the patient to discuss status of referral. Patient advised he did speak to SW and she helped with some funding assistance. CG will send out Financial resources and [place] FAP along with Transportation information in [facility] as well as patient expressed the fact he was having issues with transportation as well. [facility] transportation will provide a courtesy ride for 9/20 appt and patient has agreed to the rider's waiver listed below. Pt has been advised: I have mailed the following information and if mail has not received the information in 7 to 14 days or have additional questions to please call me back at [phone]. Patient understood.  Follow Up Plan:  No further follow up planned at this time. The patient has been provided with needed resources.  [facility] Guide, Embedded Care Coordination [facility]  [place], [place] [address]  Main Phone: [phone]  E-mail: [email or web address]  Website: [email or web address]    JOMARION MISH DOB: 10-12-1951 MRN: [id number]   RIDER WAIVER AND RELEASE OF LIABILITY  For purposes of improving physical access to our facilities, [place] is pleased to partner with third parties to provide [place] patients or other authorized individuals the option of convenient, on-demand ground transportation services (the Chiropractor") through use of the technology service that enables users to request on-demand ground transportation from independent third-party providers.  By opting to use and  accept these [employer], I, the undersigned, hereby agree on behalf of myself, and on behalf of any minor child using the Science writer for whom I am the parent or legal guardian, as follows:  Science writer provided to me are provided by independent third-party transportation providers who are not [employer] or employees and who are unaffiliated with [employer]. [place] is neither a transportation carrier nor a common or public carrier. [place] has no control over the quality or safety of the transportation that occurs as a result of the [employer]. [place] cannot guarantee that any third-party transportation provider will complete any arranged transportation service. [place] makes no representation, warranty, or guarantee regarding the reliability, timeliness, quality, safety, suitability, or availability of any of the Transport Services or that they will be error free. I fully understand that traveling by vehicle involves risks and dangers of serious bodily injury, including permanent disability, paralysis, and death. I agree, on behalf of myself and on behalf of any minor child using the Transport Services for whom I am the parent or legal guardian, that the entire risk arising out of my use of the [employer] remains solely with me, to the maximum extent permitted under applicable law. The [employer] are provided "as is" and "as available." [place] disclaims all representations and warranties, express, implied or statutory, not expressly set out in these terms, including the implied warranties of merchantability and fitness for a particular purpose. I hereby waive and release [place], its agents, employees, officers, directors, representatives, insurers, attorneys, assigns, successors, subsidiaries, and affiliates from any and all past, present, or future claims, demands, liabilities, actions, causes of action, or  suits of any  kind directly or indirectly arising from acceptance and use of the [employer]. I further waive and release [place] and its affiliates from all present and future liability and responsibility for any injury or death to persons or damages to property caused by or related to the use of the [employer]. I have read this Waiver and Release of Liability, and I understand the terms used in it and their legal significance. This Waiver is freely and voluntarily given with the understanding that my right (as well as the right of any minor child for whom I am the parent or legal guardian using the [employer]) to legal recourse against [place] in connection with the [employer] is knowingly surrendered in return for use of these services.   I attest that I read the consent document to [name], gave Mr. Runk the opportunity to ask questions and answered the questions asked (if any). I affirm that [name] then provided consent for he's participation in this program.     Jerrol Banana

## 2022-09-09 ENCOUNTER — Telehealth: Payer: Self-pay | Admitting: Pharmacy Technician

## 2022-09-09 DIAGNOSIS — E1165 Type 2 diabetes mellitus with hyperglycemia: Secondary | ICD-10-CM | POA: Diagnosis not present

## 2022-09-09 DIAGNOSIS — E6609 Other obesity due to excess calories: Secondary | ICD-10-CM | POA: Diagnosis not present

## 2022-09-09 DIAGNOSIS — Z596 Low income: Secondary | ICD-10-CM

## 2022-09-09 DIAGNOSIS — K52832 Lymphocytic colitis: Secondary | ICD-10-CM | POA: Diagnosis not present

## 2022-09-09 NOTE — Progress Notes (Signed)
[facility])                                            [place] Team    09/09/2022  JAXTIN RAIMONDO [birth year] [id number]                                      Medication Assistance Referral  Referral From:  [facility] Asajah Damita Dunnings  Medication/Company: Celesta Gentile / [employer] Patient application portion:  Mailed Provider application portion:  Mailed to Dr. Betty Martinique Provider address/fax verified via: Office website  Medication/Company: Vania Rea / [employer] Patient application portion:  Mailed Provider application portion: Faxed  to Dr. Betty Martinique Provider address/fax verified via: Office website   Domnique Vantine P. Sahily Biddle, [place]  [phone]

## 2022-09-25 ENCOUNTER — Telehealth: Payer: Self-pay | Admitting: Pharmacy Technician

## 2022-09-25 DIAGNOSIS — Z596 Low income: Secondary | ICD-10-CM

## 2022-09-25 NOTE — Progress Notes (Signed)
[facility])                                            [place] Team    09/25/2022  GOTTLIEB ZUERCHER 04/09/1951 [id number]  Received both patient and provider portion(s) of patient assistance application(s) for [employer] and [employer]. Faxed completed application and required documents into [employer] for Jardiance and Mailed completed application and required documents into [employer] for [employer].   Lonney Revak P. Mackenzye Mackel, [place]  [phone]

## 2022-09-28 ENCOUNTER — Other Ambulatory Visit: Payer: Self-pay | Admitting: Family Medicine

## 2022-09-28 DIAGNOSIS — E876 Hypokalemia: Secondary | ICD-10-CM

## 2022-09-30 ENCOUNTER — Telehealth: Payer: Self-pay | Admitting: Pharmacy Technician

## 2022-09-30 DIAGNOSIS — Z596 Low income: Secondary | ICD-10-CM

## 2022-09-30 NOTE — Progress Notes (Signed)
[facility])                                            [place] Team    09/30/2022  MARCELLE BEBOUT 1951-03-13 [id number]  Care coordination call placed to [facility] in regard to [facility] application.  Spoke to [place] who informs patient is APPROVED 09/24/22-12/20/22. Medication will be delivered to patient's home.  Kambry Takacs P. Humna Moorehouse, [place]  [phone]

## 2022-10-06 ENCOUNTER — Encounter: Payer: Self-pay | Admitting: Family Medicine

## 2022-10-12 ENCOUNTER — Other Ambulatory Visit: Payer: Self-pay | Admitting: Family Medicine

## 2022-10-12 DIAGNOSIS — E1159 Type 2 diabetes mellitus with other circulatory complications: Secondary | ICD-10-CM

## 2022-10-14 ENCOUNTER — Other Ambulatory Visit: Payer: Self-pay | Admitting: Family Medicine

## 2022-10-29 ENCOUNTER — Telehealth: Payer: Self-pay | Admitting: Pharmacy Technician

## 2022-10-29 DIAGNOSIS — Z596 Low income: Secondary | ICD-10-CM

## 2022-10-29 NOTE — Progress Notes (Signed)
[facility] Customer service manager [facility])                                            [facility] Quality Pharmacy Team    10/29/2022  ADITH TEJADA 1951/09/14 [id number]  Care coordination call placed to [employer] in regard to [facility] application.  Spoke to Angelica who informs patient is APPROVED 10/22/22-12/20/22. Medication will be delivered to the patient's home.  Maverick Patman P. Boruch Manuele, CPhT [facility] [employer]  [phone]

## 2022-10-30 ENCOUNTER — Telehealth: Payer: Self-pay | Admitting: Family Medicine

## 2022-10-30 NOTE — Progress Notes (Unsigned)
HPI: Charles [name] is a 71 y.o. male with medical hx significant for poorly controlled DM II,HTN,GERD,OA,asthma, PAD, and HLD today with his caregiver,niece, who would like to go through his medication and to be explained why he is taking several medications for the same problem. She states that she has hx of HTN herself and she is just taking one medication. She wants to know why one medication cannot controlled his BP or glucose. He acknowledges he has not been consistent with following a healthful diet. He does not exercise regularly. He is not sure about last time he met with nutritionist, his caregiver would like an appt.  I saw him last on 09/04/22. Metformin was discontinued because chronic diarrhea, resolved since he did so. He got Januvia 50 mg  and Jardiance 25 mg through pt assistance, has not taken them because he was not sure if he needed to be on both medications. States that he called on 10/29/22 to ask about how to take medications and did not get call back until next day. DM II has been poorly controlled. His insurance does not cover some medications like Trulicity and victoza, he has declined insulin. He is not checking BS's. Negative for  polydipsia,polyuria, or polyphagia. Lab Results  Component Value Date   HGBA1C 8.5 (A) 09/04/2022   He has a bag with several full bottle of expired medications , requesting guidance about disposal.  HTN on Amlodipine 10 mg daily, Irbesartan 75 mg daily,and Metoprolol succinate 100 mg daily. He is not checking BP regularly. Negative for severe/frequent headache, visual changes, chest pain, dyspnea, palpitation,focal weakness, or edema. Lab Results  Component Value Date   CREATININE 1.16 09/04/2022   BUN 12 09/04/2022   NA 137 09/04/2022   K 3.5 09/04/2022   CL 101 09/04/2022   CO2 26 09/04/2022   Gout on Allopurinol 300 mg daily. HLD on Rosuvastatin 10 mg daily. PAD on Aspirin 81 mg daily. HypoK+ on KLOR 20 meq  daily.  Caregiver is also inquiring about "someone he can talk to" when he needs ot. She does not elaborate and he is not reporting anxiety or depression like symptoms.    09/04/2022    9:41 AM 09/01/2022   10:25 AM 05/04/2022    8:48 AM 09/23/2021   12:45 PM 08/20/2021   10:24 AM  Depression screen PHQ 2/9  Decreased Interest 0 0 2 0 0  Down, Depressed, Hopeless 0 0 1 0 1  PHQ - 2 Score 0 0 3 0 1  Altered sleeping   1    Tired, decreased energy   1    Change in appetite   2    Feeling bad or failure about yourself    1    Trouble concentrating   0    Moving slowly or fidgety/restless   0    Suicidal thoughts   0    PHQ-9 Score   8    Difficult doing work/chores   Not difficult at all     Review of Systems  Constitutional:  Negative for activity change, appetite change and fever.  Respiratory:  Negative for cough and wheezing.   Gastrointestinal:  Negative for anal bleeding, nausea and vomiting.  Genitourinary:  Negative for decreased urine volume, dysuria and hematuria.  Musculoskeletal:  Positive for arthralgias and back pain.  Neurological:  Positive for dizziness (Sometiems when getting up fast.). Negative for syncope and weakness.  Rest see pertinent positives and negatives per HPI.  Current Outpatient  Medications on File Prior to Visit  Medication Sig Dispense Refill   Accu-Chek FastClix Lancets MISC USE TO TEST BLOOD SUGARS TWO TIMES DAILY. 204 each 2   albuterol (VENTOLIN HFA) 108 (90 Base) MCG/ACT inhaler Inhale 2 puffs into the lungs every 6 (six) hours as needed for wheezing or shortness of breath. 8 g 1   Alcohol Swabs (B-D SINGLE USE SWABS REGULAR) PADS 1 Device by Does not apply route daily. 90 each 3   allopurinol (ZYLOPRIM) 300 MG tablet TAKE 1 TABLET EVERY DAY 90 tablet 2   amLODipine (NORVASC) 10 MG tablet TAKE 1 TABLET EVERY DAY 90 tablet 2   aspirin 81 MG tablet Take 81 mg by mouth every evening.      empagliflozin (JARDIANCE) 25 MG TABS tablet Take 1 tablet  (25 mg total) by mouth daily before breakfast. 30 tablet 3   glimepiride (AMARYL) 4 MG tablet TAKE 1 TABLET 10 TO 15 MINUTES BEFORE BREAKFAST. 90 tablet 1   glucose blood (ACCU-CHEK GUIDE) test strip TEST BLOOD SUGAR 1-2 TIMES DAILY 200 strip 1   irbesartan (AVAPRO) 75 MG tablet TAKE 1 TABLET EVERY DAY (Patient taking differently: Take 75 mg by mouth daily.) 90 tablet 3   metoprolol succinate (TOPROL-XL) 50 MG 24 hr tablet TAKE 1 TABLET EVERY DAY. TAKE WITH OR IMMEDIATELY FOLLOWING A MEAL (DOSE INCREASE) 90 tablet 1   potassium chloride SA (KLOR-CON M) 20 MEQ tablet TAKE 1 TABLET EVERY DAY 90 tablet 3   rosuvastatin (CRESTOR) 10 MG tablet TAKE 1 TABLET EVERY DAY 90 tablet 0   sitaGLIPtin (JANUVIA) 50 MG tablet Take 1 tablet (50 mg total) by mouth daily. 30 tablet 3   No current facility-administered medications on file prior to visit.   Past Medical History:  Diagnosis Date   Abnormal carotid ultrasound 07/2015   mild bilateral stenosis, repeat in 1 year.  Dr. Debara Pickett   Diabetes mellitus without complication ([place])    Gout    Hepatitis C antibody test positive 05/2015   prior infection, resolved   History of cardiovascular stress test 9/16   Lexiscan, EF 55%, no ischemia, Dr. Debara Pickett   Hyperlipidemia    Hypertension    Noncompliance    Obesity    PVD (peripheral vascular disease) ([place]) 07/2015   normal ABIs, Dr. Debara Pickett   Allergies  Allergen Reactions   Penicillins Other (See Comments)    Per pt blacked out after injection as a child.  Did it involve swelling of the face/tongue/throat, SOB, or low BP? N Did it involve sudden or severe rash/hives, skin peeling, or any reaction on the inside of your mouth or nose? N Did you need to seek medical attention at a hospital or doctor's office? N When did it last happen?   Several Decades Ago    If all above answers are "NO", may proceed with cephalosporin use.    Social History   Socioeconomic History   Marital status: Divorced    Spouse  name: Not on file   Number of children: Not on file   Years of education: Not on file   Highest education level: Not on file  Occupational History   Not on file  Tobacco Use   Smoking status: Former    Packs/day: 1.00    Years: 30.00    Total pack years: 30.00    Types: Cigarettes    Quit date: 07/31/1995    Years since quitting: 27.2   Smokeless tobacco: Never  Vaping Use  Vaping Use: Never used  Substance and Sexual Activity   Alcohol use: No    Alcohol/week: 0.0 standard drinks of alcohol   Drug use: No   Sexual activity: Not on file  Other Topics Concern   Not on file  Social History Narrative   Not on file   Social Determinants of Health   Financial Resource Strain: Low Risk  (09/01/2022)   Overall Financial Resource Strain (CARDIA)    Difficulty of Paying Living Expenses: Not hard at all  Food Insecurity: No Food Insecurity (09/01/2022)   Hunger Vital Sign    Worried About Running Out of Food in the Last Year: Never true    Ran Out of Food in the Last Year: Never true  Transportation Needs: No Transportation Needs (09/07/2022)   PRAPARE - Hydrologist (Medical): No    Lack of Transportation (Non-Medical): No  Physical Activity: Inactive (09/01/2022)   Exercise Vital Sign    Days of Exercise per Week: 0 days    Minutes of Exercise per Session: 0 min  Stress: No Stress Concern Present (09/01/2022)   [place]    Feeling of Stress : Not at all  Social Connections: Socially Isolated (08/20/2021)   Social Connection and Isolation Panel [NHANES]    Frequency of Communication with Friends and Family: Once a week    Frequency of Social Gatherings with Friends and Family: Once a week    Attends Religious Services: Never    Marine scientist or Organizations: No    Attends Archivist Meetings: Never    Marital Status: Divorced   Vitals:   11/02/22 1618  BP:  128/70  Pulse: 86  Resp: 16  SpO2: 97%   Wt Readings from Last 3 Encounters:  11/02/22 258 lb (117 kg)  09/04/22 260 lb 2 oz (118 kg)  09/01/22 260 lb (117.9 kg)  Body mass index is 41.64 kg/m.  Physical Exam Vitals and nursing note reviewed.  Constitutional:      General: He is not in acute distress.    Appearance: He is well-developed.  HENT:     Head: Normocephalic and atraumatic.  Eyes:     Conjunctiva/sclera: Conjunctivae normal.  Cardiovascular:     Rate and Rhythm: Normal rate and regular rhythm.     Pulses:          Dorsalis pedis pulses are 2+ on the right side and 2+ on the left side.     Heart sounds: No murmur heard. Pulmonary:     Effort: Pulmonary effort is normal. No respiratory distress.     Breath sounds: Normal breath sounds.  Abdominal:     Palpations: Abdomen is soft. There is no hepatomegaly or mass.     Tenderness: There is no abdominal tenderness.  Lymphadenopathy:     Cervical: No cervical adenopathy.  Skin:    General: Skin is warm.     Findings: No erythema or rash.  Neurological:     Mental Status: He is alert and oriented to person, place, and time.     Cranial Nerves: No cranial nerve deficit.     Gait: Gait normal.  Psychiatric:        Mood and Affect: Mood and affect normal.   ASSESSMENT AND PLAN:  Charles [name] was seen today for follow-up.  Diagnoses and all orders for this visit: Orders Placed This Encounter  Procedures   Amb Referral to  Nutrition and Diabetic Education   Type 2 diabetes mellitus with vascular disease ([place]) Problem has not been well controlled. Continue Jardiance 25 mg and Januvia 50 mg daily. Recheck hemoglobin A1c on December 27th follow-up visit Referral to nutritionist for dietary counseling Encourage patient to monitor blood glucose levels regularly. Annual eye exam and foot care to continue.  Asthma Problem is stable. Continue Albuterol inh 2 puff q 4-6 hours as needed  Acute idiopathic gout of  multiple sites He has not had a goat attack in a while. Continue Allopurinol 300 mg daily and low purine diet.  Diarrhea He has seen GI for this problem. Colonoscopy 07/30/22. Resolved after discontinuing Metformin.   Essential hypertension Continue current medications at same dose: Amlodipine, Irbesartan, and Metoprolol Monitor blood pressure at home. Low salt diet to continue. Will reevaluate medication regimen if he has low BP's at home.  PAD (peripheral artery disease) (HCC) LDL 57 in 08/2022.S/P possible stent placement. Peripheral pulses palpable. Continue Rosuvastatin and Aspirin.  Hyperlipidemia LDL at goal. Continue Rosuvastatin 10 mg daily and low fat diet.  I spent a total of 46 minutes in both face to face and non face to face activities for this visit on the date of this encounter. During this time history was obtained and documented, examination was performed, prior labs reviewed, and assessment/plan discussed. Fall precautions. Provide a list of therapists for potential counseling services Explained niece that he does not need referral for behavioral health services, handout with psychotherapist contact information given. Medication disposal: Instruct patient on proper disposal methods for expired medications  Return in about 6 weeks (around 12/16/2022) for chronic problems.  Urijah Arko G. Martinique, MD  [facility] [place] office.

## 2022-10-30 NOTE — Telephone Encounter (Signed)
I spoke with patient and his niece. He is aware he is to take both medications once daily. Appointment scheduled for Monday at 4:30 to go over his medications.

## 2022-10-30 NOTE — Telephone Encounter (Signed)
Pt received in the mail these rxs   Disp Refills Start End   sitaGLIPtin (JANUVIA) 50 MG tablet       empagliflozin (JARDIANCE) 25 MG TABS tablet  . The medications does not have directions on the bottle please call pt day

## 2022-11-02 ENCOUNTER — Ambulatory Visit (INDEPENDENT_AMBULATORY_CARE_PROVIDER_SITE_OTHER): Payer: Medicare PPO | Admitting: Family Medicine

## 2022-11-02 ENCOUNTER — Encounter: Payer: Self-pay | Admitting: Family Medicine

## 2022-11-02 VITALS — BP 128/70 | HR 86 | Resp 16 | Ht 66.0 in | Wt 258.0 lb

## 2022-11-02 DIAGNOSIS — R197 Diarrhea, unspecified: Secondary | ICD-10-CM

## 2022-11-02 DIAGNOSIS — I10 Essential (primary) hypertension: Secondary | ICD-10-CM

## 2022-11-02 DIAGNOSIS — E785 Hyperlipidemia, unspecified: Secondary | ICD-10-CM | POA: Diagnosis not present

## 2022-11-02 DIAGNOSIS — J45909 Unspecified asthma, uncomplicated: Secondary | ICD-10-CM | POA: Diagnosis not present

## 2022-11-02 DIAGNOSIS — M1009 Idiopathic gout, multiple sites: Secondary | ICD-10-CM

## 2022-11-02 DIAGNOSIS — I739 Peripheral vascular disease, unspecified: Secondary | ICD-10-CM | POA: Diagnosis not present

## 2022-11-02 DIAGNOSIS — E1159 Type 2 diabetes mellitus with other circulatory complications: Secondary | ICD-10-CM

## 2022-11-02 NOTE — Assessment & Plan Note (Signed)
Problem has not been well controlled. Continue Jardiance 25 mg and Januvia 50 mg daily. Recheck hemoglobin A1c on December 27th follow-up visit Referral to nutritionist for dietary counseling Encourage patient to monitor blood glucose levels regularly. Annual eye exam and foot care to continue.

## 2022-11-02 NOTE — Assessment & Plan Note (Signed)
He has seen GI for this problem. Colonoscopy 07/30/22. Resolved after discontinuing Metformin.

## 2022-11-02 NOTE — Patient Instructions (Addendum)
A few things to remember from today's visit:  Type 2 diabetes mellitus with vascular disease (HCC) - Plan: Amb Referral to Nutrition and Diabetic Education  Hyperlipidemia, unspecified hyperlipidemia type  Uncomplicated asthma, unspecified asthma severity, unspecified whether persistent  Acute idiopathic gout of multiple sites  No changes today. Appointment in 11/2023 for A1C.  If you need refills for medications you take chronically, please call your pharmacy. Do not use My Chart to request refills or for acute issues that need immediate attention. If you send a my chart message, it may take a few days to be addressed, specially if I am not in the office.  Please be sure medication list is accurate. If a new problem present, please set up appointment sooner than planned today.  Diabetes Mellitus and Nutrition, Adult When you have diabetes, or diabetes mellitus, it is very important to have healthy eating habits because your blood sugar (glucose) levels are greatly affected by what you eat and drink. Eating healthy foods in the right amounts, at about the same times every day, can help you: Manage your blood glucose. Lower your risk of heart disease. Improve your blood pressure. Reach or maintain a healthy weight. What can affect my meal plan? Every person with diabetes is different, and each person has different needs for a meal plan. Your health care provider may recommend that you work with a dietitian to make a meal plan that is best for you. Your meal plan may vary depending on factors such as: The calories you need. The medicines you take. Your weight. Your blood glucose, blood pressure, and cholesterol levels. Your activity level. Other health conditions you have, such as heart or kidney disease. How do carbohydrates affect me? Carbohydrates, also called carbs, affect your blood glucose level more than any other type of food. Eating carbs raises the amount of glucose in your  blood. It is important to know how many carbs you can safely have in each meal. This is different for every person. Your dietitian can help you calculate how many carbs you should have at each meal and for each snack. How does alcohol affect me? Alcohol can cause a decrease in blood glucose (hypoglycemia), especially if you use insulin or take certain diabetes medicines by mouth. Hypoglycemia can be a life-threatening condition. Symptoms of hypoglycemia, such as sleepiness, dizziness, and confusion, are similar to symptoms of having too much alcohol. Do not drink alcohol if: Your health care provider tells you not to drink. You are pregnant, may be pregnant, or are planning to become pregnant. If you drink alcohol: Limit how much you have to: 0-1 drink a day for women. 0-2 drinks a day for men. Know how much alcohol is in your drink. In the [place] one drink equals one 12 oz bottle of beer (355 mL), one 5 oz glass of wine (148 mL), or one 1 oz glass of hard liquor (44 mL). Keep yourself hydrated with water, diet soda, or unsweetened iced tea. Keep in mind that regular soda, juice, and other mixers may contain a lot of sugar and must be counted as carbs. What are tips for following this plan?  Reading food labels Start by checking the serving size on the Nutrition Facts label of packaged foods and drinks. The number of calories and the amount of carbs, fats, and other nutrients listed on the label are based on one serving of the item. Many items contain more than one serving per package. Check the total grams (g)  of carbs in one serving. Check the number of grams of saturated fats and trans fats in one serving. Choose foods that have a low amount or none of these fats. Check the number of milligrams (mg) of salt (sodium) in one serving. Most people should limit total sodium intake to less than 2,300 mg per day. Always check the nutrition information of foods labeled as "low-fat" or "nonfat." These  foods may be higher in added sugar or refined carbs and should be avoided. Talk to your dietitian to identify your daily goals for nutrients listed on the label. Shopping Avoid buying canned, pre-made, or processed foods. These foods tend to be high in fat, sodium, and added sugar. Shop around the outside edge of the grocery store. This is where you will most often find fresh fruits and vegetables, bulk grains, fresh meats, and fresh dairy products. Cooking Use low-heat cooking methods, such as baking, instead of high-heat cooking methods, such as deep frying. Cook using healthy oils, such as olive, canola, or sunflower oil. Avoid cooking with butter, cream, or high-fat meats. Meal planning Eat meals and snacks regularly, preferably at the same times every day. Avoid going long periods of time without eating. Eat foods that are high in fiber, such as fresh fruits, vegetables, beans, and whole grains. Eat 4-6 oz (112-168 g) of lean protein each day, such as lean meat, chicken, fish, eggs, or tofu. One ounce (oz) (28 g) of lean protein is equal to: 1 oz (28 g) of meat, chicken, or fish. 1 egg.  cup (62 g) of tofu. Eat some foods each day that contain healthy fats, such as avocado, nuts, seeds, and fish. What foods should I eat? Fruits Berries. Apples. Oranges. Peaches. Apricots. Plums. Grapes. Mangoes. Papayas. Pomegranates. Kiwi. Cherries. Vegetables Leafy greens, including lettuce, spinach, kale, chard, collard greens, mustard greens, and cabbage. Beets. Cauliflower. Broccoli. Carrots. Green beans. Tomatoes. Peppers. Onions. Cucumbers. Brussels sprouts. Grains Whole grains, such as whole-wheat or whole-grain bread, crackers, tortillas, cereal, and pasta. Unsweetened oatmeal. Quinoa. Brown or wild rice. Meats and other proteins Seafood. Poultry without skin. Lean cuts of poultry and beef. Tofu. Nuts. Seeds. Dairy Low-fat or fat-free dairy products such as milk, yogurt, and cheese. The  items listed above may not be a complete list of foods and beverages you can eat and drink. Contact a dietitian for more information. What foods should I avoid? Fruits Fruits canned with syrup. Vegetables Canned vegetables. Frozen vegetables with butter or cream sauce. Grains Refined white flour and flour products such as bread, pasta, snack foods, and cereals. Avoid all processed foods. Meats and other proteins Fatty cuts of meat. Poultry with skin. Breaded or fried meats. Processed meat. Avoid saturated fats. Dairy Full-fat yogurt, cheese, or milk. Beverages Sweetened drinks, such as soda or iced tea. The items listed above may not be a complete list of foods and beverages you should avoid. Contact a dietitian for more information. Questions to ask a health care provider Do I need to meet with a certified diabetes care and education specialist? Do I need to meet with a dietitian? What number can I call if I have questions? When are the best times to check my blood glucose? Where to find more information: American Diabetes Association: diabetes.org Academy of Nutrition and Dietetics: eatright.[employer] of Diabetes and Digestive and Kidney Diseases: StageSync.si Association of Diabetes Care & Education Specialists: diabeteseducator.org Summary It is important to have healthy eating habits because your blood sugar (glucose) levels are greatly  affected by what you eat and drink. It is important to use alcohol carefully. A healthy meal plan will help you manage your blood glucose and lower your risk of heart disease. Your health care provider may recommend that you work with a dietitian to make a meal plan that is best for you. This information is not intended to replace advice given to you by your health care provider. Make sure you discuss any questions you have with your health care provider. Document Revised: 07/10/2020 Document Reviewed: 07/10/2020 Elsevier Patient  Education  2023 [employer].

## 2022-11-02 NOTE — Assessment & Plan Note (Signed)
He has not had a goat attack in a while. Continue Allopurinol 300 mg daily and low purine diet.

## 2022-11-02 NOTE — Assessment & Plan Note (Signed)
Problem is stable. Continue Albuterol inh 2 puff q 4-6 hours as needed

## 2022-11-02 NOTE — Assessment & Plan Note (Signed)
LDL 57 in 08/2022.S/P possible stent placement. Peripheral pulses palpable. Continue Rosuvastatin and Aspirin.

## 2022-11-02 NOTE — Assessment & Plan Note (Signed)
Continue current medications at same dose: Amlodipine, Irbesartan, and Metoprolol Monitor blood pressure at home. Low salt diet to continue. Will reevaluate medication regimen if he has low BP's at home.

## 2022-11-02 NOTE — Assessment & Plan Note (Signed)
LDL at goal. Continue Rosuvastatin 10 mg daily and low fat diet.

## 2022-11-16 ENCOUNTER — Telehealth: Payer: Self-pay

## 2022-11-16 ENCOUNTER — Other Ambulatory Visit: Payer: Self-pay | Admitting: Family Medicine

## 2022-11-16 DIAGNOSIS — E1159 Type 2 diabetes mellitus with other circulatory complications: Secondary | ICD-10-CM

## 2022-11-16 DIAGNOSIS — I10 Essential (primary) hypertension: Secondary | ICD-10-CM

## 2022-11-16 NOTE — Progress Notes (Signed)
[facility]) Care Management  [facility]   11/16/2022  Charles [name] Nov 29, 1951 [id number]   2024 Medication Assistance Renewal Application Summary:  Patient was outreached by [facility] Team regarding medication assistance renewal for 2024. Verified address, anticipated insurance for 2024, and income has not changed. Patient remains interested in PAP for 2024 for Januvia and Jardiance, no other new medications were identified for medication assistance.    Medication Assistance Findings:  Medication assistance needs identified: Januvia and Jardiance    Plan: I will route patient assistance letter to [facility] pharmacy technician who will coordinate patient assistance program application process for medications listed above.  [facility] pharmacy technician will assist with obtaining all required documents from both patient and provider(s) and submit application(s) once completed.    Thank you for allowing pharmacy to be a part of this patient's care.  Cephus Shelling, PharmD Clinical Pharmacist [facility] Cell: [phone]

## 2022-11-17 ENCOUNTER — Other Ambulatory Visit: Payer: Self-pay | Admitting: Family Medicine

## 2022-11-17 DIAGNOSIS — E118 Type 2 diabetes mellitus with unspecified complications: Secondary | ICD-10-CM

## 2022-11-20 ENCOUNTER — Telehealth: Payer: Self-pay | Admitting: Pharmacy Technician

## 2022-11-20 DIAGNOSIS — Z596 Low income: Secondary | ICD-10-CM

## 2022-11-20 NOTE — Progress Notes (Signed)
[facility])                                            [facility] Quality Pharmacy Team    11/20/2022  JESSELEE POTH 10/06/1951 [id number]                                      Medication Assistance Referral-FOR 2024 RE ENROLLMENT  Referral From:  [facility] R[facility]  Eda Paschal Para March  Medication/Company: Alma Friendly / [employer] Patient application portion:  Mailed Provider application portion:  Mailed to Dr. Betty [place] Provider address/fax verified via: Office website  Medication/Company: [employer] / [employer] Patient application portion:  Mailed Provider application portion: Faxed  to Dr. Betty [place] Provider address/fax verified via: Office website    Regena Delucchi P. Shaunika Italiano, CPhT [facility]  [phone]

## 2022-12-08 ENCOUNTER — Telehealth: Payer: Self-pay | Admitting: Pharmacy Technician

## 2022-12-08 DIAGNOSIS — Z596 Low income: Secondary | ICD-10-CM

## 2022-12-08 NOTE — Progress Notes (Signed)
[facility])                                            [facility] Quality Pharmacy Team    12/08/2022  Charles [name] 08/30/1951 [id number]  Received both patient and provider portion(s) of patient assistance application(s) for Jardiance. Faxed completed application and required documents into [employer].  Still awaiting provider's portion of [employer] application to either be mailed or inter office mailed back to [facility] for Januvia as [employer] does not accept faxes.  Philopateer Strine P. Charles [name], CPhT [facility]  [phone]

## 2022-12-10 ENCOUNTER — Telehealth: Payer: Self-pay | Admitting: Pharmacy Technician

## 2022-12-10 DIAGNOSIS — Z596 Low income: Secondary | ICD-10-CM

## 2022-12-10 NOTE — Progress Notes (Signed)
[facility])                                            [facility]acy Team    12/10/2022  Charles [name] 20-Jun-1951 [id number]  Received both patient and provider portion(s) of patient assistance application(s) for Januvia. Faxed completed application and required documents into [employer].    [name] P. [name], CPhT [facility]  [phone]

## 2022-12-15 NOTE — Progress Notes (Unsigned)
HPI: [name] is a 71 y.o. male, who is here today for chronic disease management.  Last seen on 11/02/2022, when he had his niece with him here in the office, today he is unaccompanied. No new problems since his last visit.  Today he is c/o episodes of dizziness he has had for sometime. Dizziness occurs in the morning and afternoon, and has been consistent over time. He describes the sensation as the room spinning and sometimes when it happens while in bed, when turning, he has to sit still to prevent it from worsening. He also reports hearing a "boom, boom, boom" sound in his ears when the dizziness occurs.  PAD: He is on Rosuvastatin 10 mg daily an d Aspirin 81 mg daily. Carotid artery stenosis, last carotid US performed in 6/ 2017.  1. No significant atherosclerotic plaque or evidence of stenosis on the right. 2. Mild (1-49%) stenosis proximal left internal carotid artery secondary to heterogenous atherosclerotic plaque. 3. Vertebral arteries are patent with normal antegrade flow.   Lab Results  Component Value Date   CHOL 112 09/04/2022   HDL 39.10 09/04/2022   LDLCALC 57 09/04/2022   TRIG 79.0 09/04/2022   CHOLHDL 3 09/04/2022   He has not seen his cardiologist, Dr. Rennis Golden, since April 2022.  He also experiences left fronto temporal headaches, which are not new and no associated with dizziness. He denies any associated visual change,nausea,or focal weakness.  Hypokalemia: He is on KLOR 20 meq daily.   Hypertension:  Medications:  Amlodipine 10 mg daily, Irbesartan 75 mg daily, and Metoprolol succinate 50 mg daily. BP readings at home:Not checking. Negative exertional chest pain, dyspnea,orthopnea,PND,or edema.  Lab Results  Component Value Date   CREATININE 1.16 09/04/2022   BUN 12 09/04/2022   NA 137 09/04/2022   K 3.5 09/04/2022   CL 101 09/04/2022   CO2 26 09/04/2022   Diabetes Mellitus II: Dx'ed in 2003. He reports that his blood sugar levels  have been fluctuating, with readings in the 200s, 160s, and 170s, and occasionally dropping to 90. He has made dietary changes by decreasing his sugar intake and has lost a couple of pounds. He also mentions walking to the store twice a week and plans to increase his walking after moving to a new place. He is on Jardiance 25 mg daily, Glimepiride 4 mg daily, and Januvia 50 mg daily. - eye exam: 03/26/22 - foot exam: 05/04/22 - Negative for symptoms of hypoglycemia, polyuria, polydipsia, numbness extremities, foot ulcers/trauma  Last A1C 8.5 in 08/2022.  Lab Results  Component Value Date   MICROALBUR 3.1 (H) 09/04/2022   Review of Systems  Constitutional:  Negative for activity change, chills and fever.  Respiratory:  Negative for cough and wheezing.   Genitourinary:  Negative for decreased urine volume, dysuria and hematuria.  Skin:  Negative for rash.  Neurological:  Negative for syncope and facial asymmetry.  Psychiatric/Behavioral:  Negative for confusion. The patient is not nervous/anxious.   See other pertinent positives and negatives in HPI.  Current Outpatient Medications on File Prior to Visit  Medication Sig Dispense Refill   Accu-Chek FastClix Lancets MISC TEST BLOOD SUGARS TWO TIMES DAILY. 204 each 3   albuterol (VENTOLIN HFA) 108 (90 Base) MCG/ACT inhaler Inhale 2 puffs into the lungs every 6 (six) hours as needed for wheezing or shortness of breath. 8 g 1   Alcohol Swabs (B-D SINGLE USE SWABS REGULAR) PADS 1 Device by Does not apply route  daily. 90 each 3   allopurinol (ZYLOPRIM) 300 MG tablet TAKE 1 TABLET EVERY DAY 90 tablet 2   amLODipine (NORVASC) 10 MG tablet TAKE 1 TABLET EVERY DAY 90 tablet 2   aspirin 81 MG tablet Take 81 mg by mouth every evening.      empagliflozin (JARDIANCE) 25 MG TABS tablet Take 1 tablet (25 mg total) by mouth daily before breakfast. 30 tablet 3   glimepiride (AMARYL) 4 MG tablet TAKE 1 TABLET 10 TO 15 MINUTES BEFORE BREAKFAST. 90 tablet 1    glucose blood (ACCU-CHEK GUIDE) test strip TEST BLOOD SUGAR 1 TO 2 TIMES DAILY 200 strip 3   irbesartan (AVAPRO) 75 MG tablet TAKE 1 TABLET EVERY DAY 90 tablet 3   metoprolol succinate (TOPROL-XL) 50 MG 24 hr tablet TAKE 1 TABLET EVERY DAY. TAKE WITH OR IMMEDIATELY FOLLOWING A MEAL (DOSE INCREASE) 90 tablet 1   potassium chloride SA (KLOR-CON M) 20 MEQ tablet TAKE 1 TABLET EVERY DAY 90 tablet 3   rosuvastatin (CRESTOR) 10 MG tablet TAKE 1 TABLET EVERY DAY 90 tablet 0   sitaGLIPtin (JANUVIA) 50 MG tablet Take 1 tablet (50 mg total) by mouth daily. 30 tablet 3   No current facility-administered medications on file prior to visit.   Past Medical History:  Diagnosis Date   Abnormal carotid ultrasound 07/2015   mild bilateral stenosis, repeat in 1 year.  Dr. Rennis GoldenHilty   Diabetes mellitus without complication (HCC)    Gout    Hepatitis C antibody test positive 05/2015   prior infection, resolved   History of cardiovascular stress test 9/16   Lexiscan, EF 55%, no ischemia, Dr. Rennis GoldenHilty   Hyperlipidemia    Hypertension    Noncompliance    Obesity    PVD (peripheral vascular disease) (HCC) 07/2015   normal ABIs, Dr. Rennis GoldenHilty   Allergies  Allergen Reactions   Penicillins Other (See Comments)    Per pt blacked out after injection as a child.  Did it involve swelling of the face/tongue/throat, SOB, or low BP? N Did it involve sudden or severe rash/hives, skin peeling, or any reaction on the inside of your mouth or nose? N Did you need to seek medical attention at a hospital or doctor's office? N When did it last happen?   Several Decades Ago    If all above answers are "NO", may proceed with cephalosporin use.     Social History   Socioeconomic History   Marital status: Divorced    Spouse name: Not on file   Number of children: Not on file   Years of education: Not on file   Highest education level: 10th grade  Occupational History   Not on file  Tobacco Use   Smoking status: Former     Packs/day: 1.00    Years: 30.00    Total pack years: 30.00    Types: Cigarettes    Quit date: 07/31/1995    Years since quitting: 27.3   Smokeless tobacco: Never  Vaping Use   Vaping Use: Never used  Substance and Sexual Activity   Alcohol use: No    Alcohol/week: 0.0 standard drinks of alcohol   Drug use: No   Sexual activity: Not on file  Other Topics Concern   Not on file  Social History Narrative   Not on file   Social Determinants of Health   Financial Resource Strain: Medium Risk (12/12/2022)   Overall Financial Resource Strain (CARDIA)    Difficulty of Paying Living Expenses:  Somewhat hard  Food Insecurity: Food Insecurity Present (12/12/2022)   Hunger Vital Sign    Worried About Running Out of Food in the Last Year: Sometimes true    Ran Out of Food in the Last Year: Sometimes true  Transportation Needs: Unmet Transportation Needs (12/12/2022)   PRAPARE - Transportation    Lack of Transportation (Medical): No    Lack of Transportation (Non-Medical): Yes  Physical Activity: Insufficiently Active (12/12/2022)   Exercise Vital Sign    Days of Exercise per Week: 2 days    Minutes of Exercise per Session: 20 min  Stress: Stress Concern Present (12/12/2022)   [employer] of Occupational Health - Occupational Stress Questionnaire    Feeling of Stress : To some extent  Social Connections: Socially Isolated (12/12/2022)   Social Connection and Isolation Panel [NHANES]    Frequency of Communication with Friends and Family: Three times a week    Frequency of Social Gatherings with Friends and Family: Never    Attends Religious Services: Never    Database administrator or Organizations: No    Attends Banker Meetings: Not on file    Marital Status: Divorced   Vitals:   12/16/22 0904  BP: 128/80  Pulse: 80  Resp: 16  Temp: 98.8 F (37.1 C)  SpO2: 97%   Wt Readings from Last 3 Encounters:  12/16/22 256 lb 2 oz (116.2 kg)  11/02/22 258 lb (117 kg)   09/04/22 260 lb 2 oz (118 kg)  Body mass index is 41.34 kg/m.  Physical Exam Vitals and nursing note reviewed.  Constitutional:      General: He is not in acute distress.    Appearance: He is well-developed.  HENT:     Head: Normocephalic and atraumatic.     Right Ear: External ear normal.     Left Ear: External ear normal.     Ears:     Comments: Cerumen excess, could not see TM's. Dix-Hallpike maneuver to right elicit dizziness. Eyes:     Conjunctiva/sclera: Conjunctivae normal.  Cardiovascular:     Rate and Rhythm: Normal rate and regular rhythm.     Pulses:          Dorsalis pedis pulses are 2+ on the right side and 2+ on the left side.     Heart sounds: No murmur heard. Pulmonary:     Effort: Pulmonary effort is normal. No respiratory distress.     Breath sounds: Normal breath sounds.  Abdominal:     Palpations: Abdomen is soft. There is no hepatomegaly or mass.     Tenderness: There is no abdominal tenderness.  Lymphadenopathy:     Cervical: No cervical adenopathy.  Skin:    General: Skin is warm.     Findings: No erythema or rash.  Neurological:     Mental Status: He is alert and oriented to person, place, and time.     Cranial Nerves: No cranial nerve deficit.     Gait: Gait normal.  Psychiatric:        Mood and Affect: Mood and affect normal.   ASSESSMENT AND PLAN:  Mr.Montrelle was seen today for follow-up.  Diagnoses and all orders for this visit: Lab Results  Component Value Date   CREATININE 1.44 12/16/2022   BUN 18 12/16/2022   NA 139 12/16/2022   K 3.9 12/16/2022   CL 107 12/16/2022   CO2 24 12/16/2022   Lab Results  Component Value Date   MICROALBUR 0.8 12/16/2022  MICROALBUR 3.1 (H) 09/04/2022   Lab Results  Component Value Date   HGBA1C 8.0 (A) 12/16/2022   Lab Results  Component Value Date   TSH 3.14 12/16/2022   Lab Results  Component Value Date   WBC 7.5 12/16/2022   HGB 13.7 12/16/2022   HCT 43.8 12/16/2022   MCV 73.8 (L)  12/16/2022   PLT 205.0 12/16/2022   Type 2 diabetes mellitus with vascular disease (HCC) Assessment & Plan: HgA1C improved, it went from 8.5 to 8.0. His insurance has denied coverage for several medications, he is getting Jardiance and Januvia through pt assistance. He is planning on increasing physical activity and improving diet; so no changes today. Continue Jardiance 25 mg,Amaryl 4 mg daily, and Januvia 50 mg daily. Recommend  starting walking 10-15 min daily. Annual eye exam and foot care to continue.  Orders: -     POCT glycosylated hemoglobin (Hb A1C) -     Basic metabolic panel; Future -     Microalbumin / creatinine urine ratio; Future  Essential hypertension Assessment & Plan: BP adequately controlled. Continue Amlodipine 10 mg daily, Irbesartan 75 mg daily, and Metoprolol succinate 50 mg daily. Monitor blood pressure at home. Low salt diet to continue.  Orders: -     Basic metabolic panel; Future -     TSH; Future  Dizziness Assessment & Plan: Problem is chronic, reported in 2017. We discussed possible etiologies. Hx suggest positional vertigo. Fall precautions discussed. Monitor for new symptoms and instructed about warning signs. Further recommendations according to lab results.   Orders: -     Basic metabolic panel; Future -     CBC; Future -     TSH; Future  Hypokalemia Assessment & Plan: Last K+ 3.5 in 08/2022. Continue KLOR 20 meq daily.   PAD (peripheral artery disease) (HCC) Assessment & Plan: Continue Rosuvastatin 10 mg and aspirin 81 mg daily. He would like to hold on repeating carotid US until he sees his cardiologist.   I spent a total of 44 minutes in both face to face and non face to face activities for this visit on the date of this encounter. During this time history was obtained and documented, examination was performed, prior labs/imaging reviewed, and assessment/plan discussed.  Return in about 14 weeks (around 03/24/2023) for chronic  problems.  Terrina Docter G. [place], MD  [facility]. [place] office.

## 2022-12-16 ENCOUNTER — Ambulatory Visit (INDEPENDENT_AMBULATORY_CARE_PROVIDER_SITE_OTHER): Payer: Medicare PPO | Admitting: Family Medicine

## 2022-12-16 ENCOUNTER — Encounter: Payer: Self-pay | Admitting: Family Medicine

## 2022-12-16 VITALS — BP 128/80 | HR 80 | Temp 98.8°F | Resp 16 | Ht 66.0 in | Wt 256.1 lb

## 2022-12-16 DIAGNOSIS — E876 Hypokalemia: Secondary | ICD-10-CM | POA: Diagnosis not present

## 2022-12-16 DIAGNOSIS — E1159 Type 2 diabetes mellitus with other circulatory complications: Secondary | ICD-10-CM | POA: Diagnosis not present

## 2022-12-16 DIAGNOSIS — I10 Essential (primary) hypertension: Secondary | ICD-10-CM

## 2022-12-16 DIAGNOSIS — I739 Peripheral vascular disease, unspecified: Secondary | ICD-10-CM

## 2022-12-16 DIAGNOSIS — R42 Dizziness and giddiness: Secondary | ICD-10-CM

## 2022-12-16 DIAGNOSIS — E785 Hyperlipidemia, unspecified: Secondary | ICD-10-CM

## 2022-12-16 LAB — CBC
HCT: 43.8 % (ref 39.0–52.0)
Hemoglobin: 13.7 g/dL (ref 13.0–17.0)
MCHC: 31.2 g/dL (ref 30.0–36.0)
MCV: 73.8 fl — ABNORMAL LOW (ref 78.0–100.0)
Platelets: 205 10*3/uL (ref 150.0–400.0)
RBC: 5.93 Mil/uL — ABNORMAL HIGH (ref 4.22–5.81)
RDW: 17.5 % — ABNORMAL HIGH (ref 11.5–15.5)
WBC: 7.5 10*3/uL (ref 4.0–10.5)

## 2022-12-16 LAB — BASIC METABOLIC PANEL
BUN: 18 mg/dL (ref 6–23)
CO2: 24 mEq/L (ref 19–32)
Calcium: 9.6 mg/dL (ref 8.4–10.5)
Chloride: 107 mEq/L (ref 96–112)
Creatinine, Ser: 1.44 mg/dL (ref 0.40–1.50)
GFR: 49.07 mL/min — ABNORMAL LOW (ref >60.00)
Glucose, Bld: 134 mg/dL — ABNORMAL HIGH (ref 70–99)
Potassium: 3.9 mEq/L (ref 3.5–5.1)
Sodium: 139 mEq/L (ref 135–145)

## 2022-12-16 LAB — MICROALBUMIN / CREATININE URINE RATIO
Creatinine,U: 108.2 mg/dL
Microalb Creat Ratio: 0.7 mg/g (ref 0.0–30.0)
Microalb, Ur: 0.8 mg/dL (ref 0.0–1.9)

## 2022-12-16 LAB — POCT GLYCOSYLATED HEMOGLOBIN (HGB A1C): Hemoglobin A1C: 8 % — AB (ref 4.0–5.6)

## 2022-12-16 LAB — TSH: TSH: 3.14 u[IU]/mL (ref 0.35–5.50)

## 2022-12-16 NOTE — Assessment & Plan Note (Signed)
BP adequately controlled. Continue Amlodipine 10 mg daily, Irbesartan 75 mg daily, and Metoprolol succinate 50 mg daily. Monitor blood pressure at home. Low salt diet to continue.

## 2022-12-16 NOTE — Assessment & Plan Note (Signed)
HgA1C improved, it went from 8.5 to 8.0. His insurance has denied coverage for several medications, he is getting Jardiance and Januvia through pt assistance. He is planning on increasing physical activity and improving diet; so no changes today. Continue Jardiance 25 mg,Amaryl 4 mg daily, and Januvia 50 mg daily. Recommend  starting walking 10-15 min daily. Annual eye exam and foot care to continue.

## 2022-12-16 NOTE — Patient Instructions (Addendum)
A few things to remember from today's visit:  Type 2 diabetes mellitus with vascular disease (HCC) - Plan: POC HgB A1c, Basic metabolic panel, Microalbumin / creatinine urine ratio  Hyperlipidemia, unspecified hyperlipidemia type  Essential hypertension - Plan: Basic metabolic panel, TSH  Dizziness - Plan: Basic metabolic panel, CBC, TSH  Hypokalemia  Dizziness you reported today is suggestive of vertigo but some of your other medical problems can also cause dizziness. Fall prevention is the goal and monitor for new symptoms. Arrange follow up appt with Dr Rennis Golden.  No changes in diabetes medications. Start walking 10-15 min daily and continue avoiding sweets.   If you need refills for medications you take chronically, please call your pharmacy. Do not use My Chart to request refills or for acute issues that need immediate attention. If you send a my chart message, it may take a few days to be addressed, specially if I am not in the office.  Please be sure medication list is accurate. If a new problem present, please set up appointment sooner than planned today.

## 2022-12-17 NOTE — Assessment & Plan Note (Signed)
Continue Rosuvastatin 10 mg and aspirin 81 mg daily. He would like to hold on repeating carotid US until he sees his cardiologist.

## 2022-12-17 NOTE — Assessment & Plan Note (Signed)
Last K+ 3.5 in 08/2022. Continue KLOR 20 meq daily.

## 2022-12-17 NOTE — Assessment & Plan Note (Signed)
Problem is chronic, reported in 2017. We discussed possible etiologies. Hx suggest positional vertigo. Fall precautions discussed. Monitor for new symptoms and instructed about warning signs. Further recommendations according to lab results.

## 2022-12-27 ENCOUNTER — Other Ambulatory Visit: Payer: Self-pay | Admitting: Family Medicine

## 2022-12-27 DIAGNOSIS — E785 Hyperlipidemia, unspecified: Secondary | ICD-10-CM

## 2022-12-29 ENCOUNTER — Encounter: Payer: Self-pay | Admitting: Family Medicine

## 2022-12-30 ENCOUNTER — Telehealth: Payer: Self-pay | Admitting: Pharmacy Technician

## 2022-12-30 DIAGNOSIS — Z596 Low income: Secondary | ICD-10-CM

## 2022-12-30 NOTE — Progress Notes (Signed)
[facility])                                            [place] Team    12/30/2022  Charles [name] 26-Dec-1950 [id number]  Care coordination call placed to [facility] in regard to [facility] application.  Spoke to [place] who informs patient is APPROVED 12/21/22-12/21/23. Patient will need to call BI to reorder the medication and then it will be shipped to his home address on file.  Valyncia Wiens P. Burney [place], [place]  [phone]

## 2023-01-22 ENCOUNTER — Other Ambulatory Visit: Payer: Self-pay | Admitting: Family Medicine

## 2023-01-22 DIAGNOSIS — I10 Essential (primary) hypertension: Secondary | ICD-10-CM

## 2023-02-03 ENCOUNTER — Telehealth: Payer: Self-pay | Admitting: Pharmacy Technician

## 2023-02-03 DIAGNOSIS — Z596 Low income: Secondary | ICD-10-CM

## 2023-02-03 NOTE — Progress Notes (Signed)
[facility])                                            [place] Team    02/03/2023  Charles [name] 10/10/1951 [id number]  Care coordination call placed to [employer] in regard to [facility] application.  Spoke to [name] who informs application has not been processed despite being mailed 12/11/23. She spoke to her supervisor [name] who gave a ONE TIME exception to allow it to be faxed to her attention at [phone].  Application faxed to [employer].  [name], [place]  [phone]

## 2023-03-02 NOTE — Progress Notes (Signed)
HPI: [name] is a 72 y.o. male, who is here today for chronic disease management.  Last seen on 12/16/22  C/O experiencing right shoulder pain for about a month, limitation of ROM. The pain is localized on anterior aspect, not radiated. No recent injury.  Pain is moderate, exacerbated by movement and alleviated by rest. No edema or erythema.  He has been without Rosuvastatin for nearly two weeks due to a dosage issue. He had Rosuvastatin 10 mg and was taking 2 tabs daily since dose was increased to 20 mg daily. Tolerating medication well. Aortic atherosclerosis seen on past imaging.  Lab Results  Component Value Date   CHOL 112 09/04/2022   HDL 39.10 09/04/2022   LDLCALC 57 09/04/2022   TRIG 79.0 09/04/2022   CHOLHDL 3 09/04/2022   Today he expresses concern over his weight and has noticed a significant increase, which has negatively affected his self-esteem. He acknowledges the need for dietary changes and has attempted to adjust his eating habits, though he finds it challenging. He has not been engaging in physical activity since moving to a new neighborhood, citing safety concerns and unfamiliarity with the area.   Additionally, he reports a persistent rash on his right hand, intermittent for a while but seems to be more frequent and pruritic this time. No new soap,detergent,or body product. It has been unresponsive to over-the-counter treatments, Cortizone.  Diabetes Mellitus II: Dx'ed in 2003. He reports discontinuation of Januvia approximately two months ago due to issues with patient assistance paperwork and insurance. He is on Jardiance 25 mg daily nd Glimepiride 4 mg daily. His blood sugar levels have been higher than desired, with a recent reading of 148 mg/dL before breakfast and 176 mg/dL after eating an egg sandwich.  - Negative for symptoms of hypoglycemia, polyuria, polydipsia, numbness extremities, foot ulcers/trauma  Lab Results  Component Value  Date   HGBA1C 8.0 (A) 12/16/2022   Lab Results  Component Value Date   MICROALBUR 0.8 12/16/2022   Dx'ed with cataract in his right eye, causing vision changes, cannot afford surgical treatment at this time.  Hypertension:He is on Amlodipine 10 mg daily, Irbesartan 75 mg daily, and Metoprolol succinate 50 mg daily.  He does not check BP. Negative for unusual or severe headache, exertional chest pain, dyspnea,  focal weakness, or edema. He is also c/o right sided chest pain, intermittent for the past few weeks. It is not radiated, happens at rest and may last up to 1-2 hours.  Lab Results  Component Value Date   CREATININE 1.44 12/16/2022   BUN 18 12/16/2022   NA 139 12/16/2022   K 3.9 12/16/2022   CL 107 12/16/2022   CO2 24 12/16/2022   Review of Systems  Constitutional:  Positive for fatigue. Negative for activity change, appetite change and fever.  HENT:  Negative for nosebleeds and sore throat.   Respiratory:  Negative for cough and wheezing.   Gastrointestinal:  Negative for abdominal pain, nausea and vomiting.  Endocrine: Negative for cold intolerance and heat intolerance.  Genitourinary:  Negative for decreased urine volume, dysuria and hematuria.  Musculoskeletal:  Positive for arthralgias. Negative for gait problem.  Skin:  Negative for wound.  Neurological:  Negative for syncope and facial asymmetry.  Psychiatric/Behavioral:  Negative for confusion and hallucinations. The patient is nervous/anxious.   See other pertinent positives and negatives in HPI.  Current Outpatient Medications on File Prior to Visit  Medication Sig Dispense Refill   Accu-Chek FastClix Lancets  MISC TEST BLOOD SUGARS TWO TIMES DAILY. 204 each 3   albuterol (VENTOLIN HFA) 108 (90 Base) MCG/ACT inhaler Inhale 2 puffs into the lungs every 6 (six) hours as needed for wheezing or shortness of breath. 8 g 1   Alcohol Swabs (B-D SINGLE USE SWABS REGULAR) PADS 1 Device by Does not apply route daily. 90  each 3   allopurinol (ZYLOPRIM) 300 MG tablet TAKE 1 TABLET EVERY DAY 90 tablet 2   amLODipine (NORVASC) 10 MG tablet TAKE 1 TABLET EVERY DAY 90 tablet 2   aspirin 81 MG tablet Take 81 mg by mouth every evening.      empagliflozin (JARDIANCE) 25 MG TABS tablet Take 1 tablet (25 mg total) by mouth daily before breakfast. 30 tablet 3   glucose blood (ACCU-CHEK GUIDE) test strip TEST BLOOD SUGAR 1 TO 2 TIMES DAILY 200 strip 3   irbesartan (AVAPRO) 75 MG tablet TAKE 1 TABLET EVERY DAY 90 tablet 3   metoprolol succinate (TOPROL-XL) 50 MG 24 hr tablet TAKE 1 TABLET EVERY DAY WITH A MEAL OR IMMEDIATELY FOLLOWING A MEAL 90 tablet 3   potassium chloride SA (KLOR-CON M) 20 MEQ tablet TAKE 1 TABLET EVERY DAY 90 tablet 3   No current facility-administered medications on file prior to visit.   Past Medical History:  Diagnosis Date   Abnormal carotid ultrasound 07/2015   mild bilateral stenosis, repeat in 1 year.  Dr. Debara Pickett   Diabetes mellitus without complication ([place])    Gout    Hepatitis C antibody test positive 05/2015   prior infection, resolved   History of cardiovascular stress test 9/16   Lexiscan, EF 55%, no ischemia, Dr. Debara Pickett   Hyperlipidemia    Hypertension    Noncompliance    Obesity    PVD (peripheral vascular disease) ([place]) 07/2015   normal ABIs, Dr. Debara Pickett   Allergies  Allergen Reactions   Penicillins Other (See Comments)    Per pt blacked out after injection as a child.  Did it involve swelling of the face/tongue/throat, SOB, or low BP? N Did it involve sudden or severe rash/hives, skin peeling, or any reaction on the inside of your mouth or nose? N Did you need to seek medical attention at a hospital or doctor's office? N When did it last happen?   Several Decades Ago    If all above answers are "NO", may proceed with cephalosporin use.    Social History   Socioeconomic History   Marital status: Divorced    Spouse name: Not on file   Number of children: Not on file    Years of education: Not on file   Highest education level: 10th grade  Occupational History   Not on file  Tobacco Use   Smoking status: Former    Packs/day: 1.00    Years: 30.00    Total pack years: 30.00    Types: Cigarettes    Quit date: 07/31/1995    Years since quitting: 27.6   Smokeless tobacco: Never  Vaping Use   Vaping Use: Never used  Substance and Sexual Activity   Alcohol use: No    Alcohol/week: 0.0 standard drinks of alcohol   Drug use: No   Sexual activity: Not on file  Other Topics Concern   Not on file  Social History Narrative   Not on file   Social Determinants of Health   Financial Resource Strain: Medium Risk (12/12/2022)   Overall Financial Resource Strain (CARDIA)    Difficulty of  Paying Living Expenses: Somewhat hard  Food Insecurity: Food Insecurity Present (12/12/2022)   Hunger Vital Sign    Worried About Running Out of Food in the Last Year: Sometimes true    Ran Out of Food in the Last Year: Sometimes true  Transportation Needs: Unmet Transportation Needs (12/12/2022)   PRAPARE - Transportation    Lack of Transportation (Medical): No    Lack of Transportation (Non-Medical): Yes  Physical Activity: Insufficiently Active (12/12/2022)   Exercise Vital Sign    Days of Exercise per Week: 2 days    Minutes of Exercise per Session: 20 min  Stress: Stress Concern Present (12/12/2022)   [place]    Feeling of Stress : To some extent  Social Connections: Socially Isolated (12/12/2022)   Social Connection and Isolation Panel [NHANES]    Frequency of Communication with Friends and Family: Three times a week    Frequency of Social Gatherings with Friends and Family: Never    Attends Religious Services: Never    Marine scientist or Organizations: No    Attends Archivist Meetings: Not on file    Marital Status: Divorced   Vitals:   03/03/23 0904  BP: 126/70  Pulse:  88  Resp: 16  Temp: 98.4 F (36.9 C)  SpO2: 98%   Body mass index is 41.82 kg/m.  Physical Exam Vitals and nursing note reviewed.  Constitutional:      General: He is not in acute distress.    Appearance: He is well-developed.  HENT:     Head: Normocephalic and atraumatic.     Mouth/Throat:     Mouth: Mucous membranes are moist.  Eyes:     Conjunctiva/sclera: Conjunctivae normal.  Cardiovascular:     Rate and Rhythm: Normal rate and regular rhythm.     Pulses:          Dorsalis pedis pulses are 2+ on the right side and 2+ on the left side.     Heart sounds: No murmur heard. Pulmonary:     Effort: Pulmonary effort is normal. No respiratory distress.     Breath sounds: Normal breath sounds.  Chest:     Chest wall: No tenderness.  Abdominal:     Palpations: Abdomen is soft. There is no hepatomegaly or mass.     Tenderness: There is no abdominal tenderness.  Musculoskeletal:     Right shoulder: Tenderness present. No bony tenderness. Decreased range of motion (Mild).     Cervical back: No tenderness.     Thoracic back: No tenderness.     Comments: Right shoulder: No deformity, edema, or erythema appreciated.No muscle atrophy. Luan Pulling' test pos, empty can supraspinatus test pos, lift-Off Subscapularis test pos.  Lymphadenopathy:     Cervical: No cervical adenopathy.  Skin:    General: Skin is warm.     Findings: Rash present. No erythema.     Comments: Papular confluent rash along dorsum of right thumb with lichenification like changes. No erythema,edema,or tenderness.  Neurological:     Mental Status: He is alert and oriented to person, place, and time.     Cranial Nerves: No cranial nerve deficit.     Gait: Gait normal.  Psychiatric:        Mood and Affect: Affect normal. Mood is anxious.  ASSESSMENT AND PLAN:  Mr. Viraj was seen today for medical management of chronic issues.  Diagnoses and all orders for this visit: Lab Results  Component  Value Date    CREATININE 1.34 03/03/2023   BUN 23 03/03/2023   NA 137 03/03/2023   K 3.8 03/03/2023   CL 104 03/03/2023   CO2 24 03/03/2023   Right shoulder pain, unspecified chronicity ?Rotator cuff tendinitis. He is not interested in PT for now, ROM exercises recommended. Topical Voltaren gel qid prn and Tylenol 500 mg 3-4 times daily as needed. I do not think imaging is needed at this time.  -     Diclofenac Sodium; Apply 2 g topically 4 (four) times daily.  Dispense: 150 g; Refill: 3  Type 2 diabetes mellitus with vascular disease ([place]) Assessment & Plan: Problem has not been well controlled. Last HgA1C 8.0. Difficulty affording his medications. Today we completed form to apply for Ozempic pt assistance. He has tried Victoza and was well tolerated. Continue Jardiance 25 mg daily and Glimepiride 4 mg daily. Recommend walking 10-15 min daily. Annual eye exam and foot care to continue.  Orders: -     POCT glucose (manual entry)  Atherosclerosis of aorta (HCC) Assessment & Plan: Continue Rosuvastatin 20 mg daily. Last LDL 57 in 08/2022.  Hyperlipidemia, unspecified hyperlipidemia type Assessment & Plan: LDL at goal, 57 in 08/2022. Rosuvastatin 20 mg sent to his pharmacy to continue 1 tab daily. Low fat diet also recommended.  Orders: -     Rosuvastatin Calcium; Take 1 tablet (20 mg total) by mouth daily.  Dispense: 90 tablet; Refill: 3  Essential hypertension Assessment & Plan: BP adequately controlled. Continue Amlodipine 10 mg daily, Irbesartan 75 mg daily, and Metoprolol succinate 50 mg daily. Doe snot have a BP monitor. Low salt diet recommended. Eye exam is current.  Orders: -     Basic metabolic panel; Future  Right-sided chest pain Hx does not suggest a serious process, seems muscular pain. Topical icy hot may help. Monitor for new symptoms. If persistent, instructed to arrange appt with his cardiologist.  Chronic pruritic rash in adult Assessment & Plan: ?  Eczema. Recommend topical Triamcinolone , small amount, bid x 14 d then 1-2 times per week. If not improved, dermatology referral will be considered.  Orders: -     Triamcinolone Acetonide; Apply 2 times daily, small amount on right hand for 2 weeks then 1-2 times per week.  Dispense: 30 g; Refill: 2  I spent a total of 42 minutes in both face to face and non face to face activities for this visit on the date of this encounter. During this time history was obtained and documented, examination was performed, prior labs reviewed, and assessment/plan discussed. Today I addressed his concerns by exploring medication assistance options, recommending dietary adjustments, and suggesting low-impact exercises to improve his physical activity levels.  Return in about 3 months (around 06/03/2023) for chronic problems.  Nimah Uphoff G. [place], MD  [facility]. [place] office.

## 2023-03-03 ENCOUNTER — Ambulatory Visit (INDEPENDENT_AMBULATORY_CARE_PROVIDER_SITE_OTHER): Payer: Medicare PPO | Admitting: Family Medicine

## 2023-03-03 ENCOUNTER — Other Ambulatory Visit: Payer: Self-pay | Admitting: Family Medicine

## 2023-03-03 ENCOUNTER — Encounter: Payer: Self-pay | Admitting: Family Medicine

## 2023-03-03 VITALS — BP 126/70 | HR 88 | Temp 98.4°F | Resp 16 | Ht 66.0 in | Wt 259.1 lb

## 2023-03-03 DIAGNOSIS — L298 Other pruritus: Secondary | ICD-10-CM | POA: Diagnosis not present

## 2023-03-03 DIAGNOSIS — L2989 Other pruritus: Secondary | ICD-10-CM

## 2023-03-03 DIAGNOSIS — R079 Chest pain, unspecified: Secondary | ICD-10-CM

## 2023-03-03 DIAGNOSIS — E785 Hyperlipidemia, unspecified: Secondary | ICD-10-CM

## 2023-03-03 DIAGNOSIS — E1159 Type 2 diabetes mellitus with other circulatory complications: Secondary | ICD-10-CM

## 2023-03-03 DIAGNOSIS — I700 Atherosclerosis of aorta: Secondary | ICD-10-CM | POA: Diagnosis not present

## 2023-03-03 DIAGNOSIS — I10 Essential (primary) hypertension: Secondary | ICD-10-CM | POA: Diagnosis not present

## 2023-03-03 DIAGNOSIS — M25511 Pain in right shoulder: Secondary | ICD-10-CM | POA: Diagnosis not present

## 2023-03-03 LAB — BASIC METABOLIC PANEL
BUN: 23 mg/dL (ref 6–23)
CO2: 24 mEq/L (ref 19–32)
Calcium: 9.7 mg/dL (ref 8.4–10.5)
Chloride: 104 mEq/L (ref 96–112)
Creatinine, Ser: 1.34 mg/dL (ref 0.40–1.50)
GFR: 53.42 mL/min — ABNORMAL LOW (ref >60.00)
Glucose, Bld: 147 mg/dL — ABNORMAL HIGH (ref 70–99)
Potassium: 3.8 mEq/L (ref 3.5–5.1)
Sodium: 137 mEq/L (ref 135–145)

## 2023-03-03 LAB — GLUCOSE, POCT (MANUAL RESULT ENTRY): POC Glucose: 176 mg/dl — AB (ref 70–99)

## 2023-03-03 MED ORDER — ROSUVASTATIN CALCIUM 20 MG PO TABS: 20.0000 mg | ORAL_TABLET | Freq: Every day | ORAL | 3 refills | Status: DC

## 2023-03-03 MED ORDER — DICLOFENAC SODIUM 1 % EX GEL: 2.0000 g | Freq: Four times a day (QID) | CUTANEOUS | 3 refills | Status: DC

## 2023-03-03 MED ORDER — TRIAMCINOLONE ACETONIDE 0.1 % EX CREA: TOPICAL_CREAM | CUTANEOUS | 2 refills | Status: DC

## 2023-03-03 NOTE — Patient Instructions (Addendum)
A few things to remember from today's visit:  Type 2 diabetes mellitus with vascular disease ([place]) - Plan: POC Glucose (CBG)  Atherosclerosis of aorta (HCC)  Hyperlipidemia, unspecified hyperlipidemia type - Plan: rosuvastatin (CRESTOR) 20 MG tablet  Essential hypertension - Plan: Basic metabolic panel  Right-sided chest pain  Right shoulder pain, unspecified chronicity - Plan: diclofenac Sodium (VOLTAREN) 1 % GEL  Chest pain does not seem worrisome, ? Muscle ache. Please arrange appt with your cardiologist if pain gets worse or it is associated with other symptoms like shortness of breath or palpitations.  Right shoulder pain can be arthritis or rotator cuff tendinitis. Continue range of motion exercises. Apply Voltaren gel for pain. You can also use Icy hot or asper cream.  Walk in place daily while watching TV for 10-15 min.  If you need refills for medications you take chronically, please call your pharmacy. Do not use My Chart to request refills or for acute issues that need immediate attention. If you send a my chart message, it may take a few days to be addressed, specially if I am not in the office.  Please be sure medication list is accurate. If a new problem present, please set up appointment sooner than planned today.

## 2023-03-06 NOTE — Assessment & Plan Note (Addendum)
LDL at goal, 57 in 08/2022. Rosuvastatin 20 mg sent to his pharmacy to continue 1 tab daily. Low fat diet also recommended.

## 2023-03-06 NOTE — Assessment & Plan Note (Signed)
Problem has not been well controlled. Last HgA1C 8.0. Difficulty affording his medications. Today we completed form to apply for Ozempic pt assistance. He has tried Victoza and was well tolerated. Continue Jardiance 25 mg daily and Glimepiride 4 mg daily. Recommend walking 10-15 min daily. Annual eye exam and foot care to continue.

## 2023-03-06 NOTE — Assessment & Plan Note (Signed)
BP adequately controlled. Continue Amlodipine 10 mg daily, Irbesartan 75 mg daily, and Metoprolol succinate 50 mg daily. Doe snot have a BP monitor. Low salt diet recommended. Eye exam is current.

## 2023-03-06 NOTE — Assessment & Plan Note (Addendum)
?   Eczema. Recommend topical Triamcinolone , small amount, bid x 14 d then 1-2 times per week. If not improved, dermatology referral will be considered.

## 2023-03-06 NOTE — Assessment & Plan Note (Addendum)
Continue Rosuvastatin 20 mg daily. Last LDL 57 in 08/2022.

## 2023-03-09 ENCOUNTER — Other Ambulatory Visit: Payer: Self-pay

## 2023-03-09 DIAGNOSIS — E785 Hyperlipidemia, unspecified: Secondary | ICD-10-CM

## 2023-03-09 DIAGNOSIS — E1159 Type 2 diabetes mellitus with other circulatory complications: Secondary | ICD-10-CM

## 2023-03-10 ENCOUNTER — Telehealth: Payer: Self-pay

## 2023-03-10 NOTE — Progress Notes (Signed)
Patient ID: Charles [name], male   DOB: 1951-06-14, 72 y.o.   MRN: [id number]  Care Management & Coordination Services Pharmacy Team  Reason for Encounter: Appointment Reminder  Contacted patient to confirm telephone appointment with Theo Dills, PharmD on 03/12/23  at 31. Spoke with patient on 03/10/2023   Have you seen any other providers since your last visit? Patient reports none  Any changes in your medications or health Patient reports no  Any side effects from any medications? Patient reports he has some dizziness throughout the day that comes and goes.  Do you have an symptoms or problems not managed by your medications? Patient reports none.  Any concerns about your health right now? Patient reports he is concerned about his diabetes and would like to loose weight. Patient also reports that he is having transportation issues he does not have a vehicle and had been using uber but can not afford to do so has no reliable way of getting to appointments and tasks such as getting to the laundromat without walking a good bit and most things are far from his home.  Has your provider asked that you check blood pressure, blood sugar, or follow special diet at home? Patient reports he does not have a blood pressure cuff to check his blood pressures, he states he does check his sugars once in a while.   Do you have any problems getting your medications? Patient reports he is using [facility] his [facility] are affordable but the tell him he should receive a medication within 10 days and he has waited longer before they arrive is still waiting on one presently.  Is there anything that you would like to discuss during the appointment? Patient reports he would like to know if transportation is an included benefit with his insurance or if there are any programs that may be available to him to use for it. He would like the status of an Ozempic Application that was sent off on last week from PCP and the status  of his Januvia with [facility].  Patient assistance for the following mediations Jardiance [employer] Cares Approved  [place] to [employer] whom states that they sent pt a Attestation form to the [employer] have and are awaiting receipt of it to approve him.  [place] states they were unable to verify patients proof of income electronically so patient would need to send in to [phone] pt id # [id number]  Patient aware to bring blood pressure cuff, medications that do not need refrigeration and supplements to appointment    Chart review:  Recent office visits:  03/03/23 Martinique, [name], MD - Patient presented for right shoulder pain unspecified chronicity and other concerns. Prescribed Diclofenac. Prescribed Triamcinolone. Changed Rosuvastatin. Stopped Januvia.  11/2722 Martinique, [name], MD - Patient presented for Type 2 diabetes mellitus with vascular disease and other concerns. No medication changes.   11/02/22 Martinique, [name], MD - Patient presented for Type 2 diabetes mellitus with vascular disease and other concerns. No medication changes.   Recent consult visits:  None  Hospital visits:  None in previous 6 months  Fill History: ALLOPURINOL 300 MG TABLET 03/03/2023 90   ALBUTEROL HFA 90 MCG INHALER 11/12/2020   AMLODIPINE BESYLATE 10 MG TAB 03/03/2023 90   ACCU-CHEK GUIDE MONITOR SYSTEM 11/12/2020 90   DICLOFENAC SODIUM 1% GEL 03/03/2023 75   Jardiance 25 mg tablet 09/04/2022 90   glimepiride 4 mg  tablet 02/25/2023 90   Accu-Chek Guide test strips 01/29/2023 90   irbesartan 75 mg tablet 01/29/2023 90   Accu-Chek Fastclix Lancet Drum 01/29/2023 90   metoprolol succinate ER 50 mg tablet,extended release 24 hr 01/23/2023 90   potassium chloride ER 20 mEq tablet,extended release(part/cryst) 02/22/2023 90   ROSUVASTATIN CALCIUM 20 MG TAB 03/03/2023 90   TRIAMCINOLONE 0.1% CREAM 03/03/2023 30      Star Rating  Drugs:  Jardiance 25 mg - Last filled 09/04/22 90 DS at [facility] Glimepiride 4 mg - Last filled 02/25/23 90 DS at [facility] Irbesartan 75 mg - Last filled 01/29/23 90 DS at [facility] Rosuvastatin 20 mg - Last filled 02/19/23 90 DS at [place]: COVID Booster - Overdue Zoster Vaccine - Overdue AWV- 09/01/22     [place] Clinical Pharmacist Assistant [phone]

## 2023-03-11 NOTE — Progress Notes (Cosign Needed Addendum)
Care Management & Coordination Services Pharmacy Note  03/12/2023 Name:  JAMIS ENNEKING MRN:  [id number] DOB:  [birth year]  Summary: -A1C not at goal <7 (8 on 12/16/22) - has since started Jardiance 25mg  -Pursuing PAP for Januvia/Ozempic. Patient plans to bring proof of income on 3/25 to office to complete application -LDL at goal <70, been w/out rosuvastatin x 3 weeks due to delivery issues w/ [facility] -BP at goal <130/80 in office, no cuff to check at home -Lack of transportation has prevented him getting to medical appts  Recommendations/Changes made from today's visit: -Continue to check sugars at least twice daily -Provide ozempic samples in office on Monday, with PCP approval -ENROLL in [place] med sync and delivery -Provided information to [facility] ([facility]) for free transport to medical services; Instructed patient to contact [facility] customer service to discuss their transport benefits  Follow up plan: Monthly dispensing and adherence calls DM call in 1 and 3 months Pharmacist visit in 3 months   Subjective: Charles [name] is an 72 y.o. year old male who is a primary patient of Martinique, Malka So, MD.  The care coordination team was consulted for assistance with disease management and care coordination needs.    Engaged with patient by telephone for initial visit.  Recent office visits: 03/03/23 Martinique, [name], MD - Patient presented for right shoulder pain unspecified chronicity and other concerns. Prescribed Diclofenac. Prescribed Triamcinolone. Changed Rosuvastatin. Stopped Januvia.   11/2722 Martinique, [name], MD - Patient presented for Type 2 diabetes mellitus with vascular disease and other concerns. No medication changes.    11/02/22 Martinique, [name], MD - Patient presented for Type 2 diabetes mellitus with vascular disease and other concerns. No medication changes.  Recent consult visits: None  Hospital visits: None in previous 6  months   Objective:  Lab Results  Component Value Date   CREATININE 1.34 03/03/2023   BUN 23 03/03/2023   GFR 53.42 (L) 03/03/2023   GFRNONAA >60 05/25/2022   GFRAA 72 09/18/2020   NA 137 03/03/2023   K 3.8 03/03/2023   CALCIUM 9.7 03/03/2023   CO2 24 03/03/2023   GLUCOSE 147 (H) 03/03/2023    Lab Results  Component Value Date/Time   HGBA1C 8.0 (A) 12/16/2022 09:23 AM   HGBA1C 8.5 (A) 09/04/2022 08:40 AM   HGBA1C 8.4 (A) 09/10/2020 11:06 AM   HGBA1C 7.6 (H) 09/05/2019 07:31 AM   HGBA1C 7.8 (H) 02/03/2019 10:25 AM   HGBA1C 7.9 04/15/2017 12:00 AM   GFR 53.42 (L) 03/03/2023 10:12 AM   GFR 49.07 (L) 12/16/2022 10:14 AM   MICROALBUR 0.8 12/16/2022 10:14 AM   MICROALBUR 3.1 (H) 09/04/2022 09:29 AM    Last diabetic Eye exam:  Lab Results  Component Value Date/Time   HMDIABEYEEXA No Retinopathy 03/26/2022 12:00 AM    Last diabetic Foot exam: No results found for: "HMDIABFOOTEX"   Lab Results  Component Value Date   CHOL 112 09/04/2022   HDL 39.10 09/04/2022   LDLCALC 57 09/04/2022   TRIG 79.0 09/04/2022   CHOLHDL 3 09/04/2022       Latest Ref Rng & Units 05/25/2022    1:48 PM 05/04/2022    9:03 AM 09/23/2021    9:29 AM  Hepatic Function  Total Protein 6.5 - 8.1 g/dL 7.9  7.9  7.8   Albumin 3.5 - 5.0 g/dL 3.8  4.4  4.2   AST 15 - 41 U/L 19  18  20    ALT  0 - 44 U/L 18  18  18    Alk Phosphatase 38 - 126 U/L 72  79  76   Total Bilirubin 0.3 - 1.2 mg/dL 0.7  0.4  0.4     Lab Results  Component Value Date/Time   TSH 3.14 12/16/2022 10:14 AM   TSH 3.93 11/21/2019 09:41 AM       Latest Ref Rng & Units 12/16/2022   10:14 AM 05/25/2022    1:48 PM 12/01/2021    2:37 PM  CBC  WBC 4.0 - 10.5 K/uL 7.5  12.4  9.0   Hemoglobin 13.0 - 17.0 g/dL 13.7  13.0  13.5   Hematocrit 39.0 - 52.0 % 43.8  43.5  45.6   Platelets 150.0 - 400.0 K/uL 205.0  186  210     Lab Results  Component Value Date/Time   VITAMINB12 476 11/06/2016 12:00 AM    Clinical ASCVD: Yes  The  ASCVD Risk score (Arnett DK, et al., 2019) failed to calculate for the following reasons:   The valid total cholesterol range is 130 to 320 mg/dL        03/03/2023    9:09 AM 12/16/2022    9:23 AM 09/04/2022    9:41 AM  Depression screen PHQ 2/9  Decreased Interest 0 0 0  Down, Depressed, Hopeless 0 1 0  PHQ - 2 Score 0 1 0  Altered sleeping  1   Tired, decreased energy  1   Change in appetite  1   Feeling bad or failure about yourself   0   Trouble concentrating  0   Moving slowly or fidgety/restless  0   Suicidal thoughts  0   PHQ-9 Score  4   Difficult doing work/chores  Not difficult at all      Social History   Tobacco Use  Smoking Status Former   Packs/day: 1.00   Years: 30.00   Additional pack years: 0.00   Total pack years: 30.00   Types: Cigarettes   Quit date: 07/31/1995   Years since quitting: 27.6  Smokeless Tobacco Never   BP Readings from Last 3 Encounters:  03/03/23 126/70  12/16/22 128/80  11/02/22 128/70   Pulse Readings from Last 3 Encounters:  03/03/23 88  12/16/22 80  11/02/22 86   Wt Readings from Last 3 Encounters:  03/03/23 259 lb 2 oz (117.5 kg)  12/16/22 256 lb 2 oz (116.2 kg)  11/02/22 258 lb (117 kg)   BMI Readings from Last 3 Encounters:  03/03/23 41.82 kg/m  12/16/22 41.34 kg/m  11/02/22 41.64 kg/m    Allergies  Allergen Reactions   Penicillins Other (See Comments)    Per pt blacked out after injection as a child.  Did it involve swelling of the face/tongue/throat, SOB, or low BP? N Did it involve sudden or severe rash/hives, skin peeling, or any reaction on the inside of your mouth or nose? N Did you need to seek medical attention at a hospital or doctor's office? N When did it last happen?   Several Decades Ago    If all above answers are "NO", may proceed with cephalosporin use.     Medications Reviewed Today     Reviewed by Maren Reamer, RPH (Pharmacist) on 03/12/23 at 1251  Med List Status: <None>    Medication Order Taking? Sig Documenting Provider Last Dose Status Informant  Accu-Chek FastClix Lancets MISC [id number]  TEST BLOOD SUGARS TWO TIMES DAILY. Martinique, [name], MD  Active  albuterol (VENTOLIN HFA) 108 (90 Base) MCG/ACT inhaler [id number] Yes Inhale 2 puffs into the lungs every 6 (six) hours as needed for wheezing or shortness of breath. Martinique, [name], MD Taking Active Self, Pharmacy Records  Alcohol Swabs (B-D SINGLE USE SWABS REGULAR) PADS [id number] Yes 1 Device by Does not apply route daily. Renato Shin, MD Taking Active Self, Pharmacy Records  allopurinol [facility]) 300 MG tablet [id number] Yes TAKE 1 TABLET EVERY DAY Martinique, [name], MD Taking Active   amLODipine (NORVASC) 10 MG tablet [id number] Yes TAKE 1 TABLET EVERY DAY Martinique, [name], MD Taking Active   aspirin 81 MG tablet [id number] Yes Take 81 mg by mouth every evening.  [provider] Taking Active Self, Pharmacy Records  diclofenac Sodium (VOLTAREN) 1 % GEL [id number]  Apply 2 g topically 4 (four) times daily. Martinique, [name], MD  Active   empagliflozin (JARDIANCE) 25 MG TABS tablet [id number] Yes Take 1 tablet (25 mg total) by mouth daily before breakfast. Martinique, [name], MD Taking Active   glimepiride (AMARYL) 4 MG tablet [id number] Yes TAKE 1 TABLET EVERY DAY BEFORE BREAKFAST Martinique, [name], MD Taking Active   glucose blood (ACCU-CHEK GUIDE) test strip [id number] Yes TEST BLOOD SUGAR 1 TO 2 TIMES DAILY Martinique, [name], MD Taking Active   irbesartan (AVAPRO) 75 MG tablet [id number] Yes TAKE 1 TABLET EVERY DAY Martinique, [name], MD Taking Active   metoprolol succinate (TOPROL-XL) 50 MG 24 hr tablet [id number] Yes TAKE 1 TABLET EVERY DAY WITH A MEAL OR IMMEDIATELY FOLLOWING A MEAL Martinique, [name], MD Taking Active   potassium chloride SA (KLOR-CON M) 20 MEQ tablet [id number] Yes TAKE 1 TABLET EVERY DAY Martinique, [name], MD Taking Active   rosuvastatin (CRESTOR) 20 MG tablet [id number]  Take 1 tablet (20 mg total) by  mouth daily. Martinique, [name], MD  Active   triamcinolone cream (KENALOG) 0.1 % [id number]  Apply 2 times daily, small amount on right hand for 2 weeks then 1-2 times per week. Martinique, [name], MD  Active             SDOH:  (Social Determinants of Health) assessments and interventions performed: Yes SDOH Interventions    Flowsheet Row Care Coordination from 03/12/2023 in [place] Coordination from 09/07/2022 in [place] Coordination Clinical Support from 09/01/2022 in [place] at [place] Interventions -- -- Intervention Not Indicated  Housing Interventions -- Intervention Not Indicated --  Transportation Interventions Other (Comment)  [Provided info for [facility] to patient and also made aware that his insurance [employer] offers transport services] Intervention Not Indicated Intervention Not Indicated  Utilities Interventions Intervention Not Indicated -- --  Financial Strain Interventions -- -- Intervention Not Indicated  Physical Activity Interventions -- -- Patient Refused, Other (Comments)  Stress Interventions -- -- Intervention Not Indicated       Medication Assistance:  Jardiance approved through 2024. Ozempic pap pending. Januvia pap pending.  Medication Access: Within the past 30 days, how often has patient missed a dose of medication? Januvia, Jardiance, and Rosuvastatin due to [place] filling issues Is a pillbox or other method used to improve adherence? No  Factors that may affect medication adherence? financial need and transportation problems Are meds synced by current pharmacy? No  Are meds delivered by current pharmacy? Yes  Does patient experience delays in picking up medications due to transportation concerns? No  [facility] Reviewed: Is patient disadvantaged to use [facility]?: Yes -currently all meds not being pursued through PAP are same  cost at [facility] ($0) Current Rx insurance plan: [place] Name and location of Current pharmacy:  [facility], [address] [address] Phone: [phone] Fax: [phone]  [id number] - [place], [place]. AT [address] [place] [place] [address] Phone: [phone] Fax: [phone]  [facility] - [place], [place] - [address] [place] [address] Phone: [phone] Fax: [phone]  [facility] services reviewed with patient today?: Yes  Patient requests to transfer care to [facility]?: Yes    Compliance/Adherence/Medication fill history: Care Gaps: COVID/Shingles Vaccines  Star-Rating Drugs: Jardiance 25mg  PDC 51% - PAP Glimepiride 4mg  PDC 100% Irbesartan 75mg  PDC 68% Rosuvastatin 20mg  PDC 100%   Assessment/Plan Hypertension (BP goal <130/80) -Controlled -Current treatment: Amlodipine 10mg  1 qd Appropriate, Effective, Safe, Accessible Irbesartan 75mg  1 qd Appropriate, Effective, Safe, Accessible Metoprolol XL 50mg  1 qd Appropriate, Effective, Safe, Accessible -Medications previously tried: Carvedilol, Lisinopril, Losartan, Spironolactone -Current home readings: No BP cuff to check -Current dietary habits: Mindful of salt intake -Current exercise habits: not discussed today -Denies hypotensive/hypertensive symptoms -Educated on Daily salt intake goal < 2300 mg; -Counseled to monitor BP at home once weekly once cuff is obtained, document, and provide log at future appointments -Recommended to continue current medication  Hyperlipidemia: (LDL goal < 70) -Controlled -Current treatment: Rosuvastatin 20mg  1 qd Appropriate, Effective, Safe, Accessible -Has been without for 3 weeks due to issues with [facility] delivery -Medications previously tried: None   -Current dietary patterns: not discussed -Current exercise habits: not discussed -Educated on Cholesterol goals;  Benefits of statin for ASCVD risk reduction; -Recommended to continue current medication  Diabetes (A1c goal <7%) -Uncontrolled -Current medications: Jardiance 25mg  1 qd Appropriate, Effective, Safe, Accessible (Previously Inaccessible, just approved through PAP) Glimepiride 4mg  1 qd before breakfast Appropriate, Effective, Safe, Accessible Januvia 100mg  1 qd Appropriate, Query Effective Ozempic 0.25mg  once weekly Appropriate, Query Effective -A1C not checked since starting Jardiance. Still not started Januvia or Ozempic while pursuing PAP.  -Medications previously tried: Trulicity, Metformin, Actos, Toujeo -Current home glucose readings Reports readings in 160s fasting and in 200s postprandial -Denies hypoglycemic/hyperglycemic symptoms -Current meal patterns:  Not discussed today in specifics, patient reports he is trying to eat as healthy as he can as he waits for PAP approval for his medications -Educated on A1c and blood sugar goals; Complications of diabetes including kidney damage, retinal damage, and cardiovascular disease; -Counseled to check feet daily and get yearly eye exams -Counseled on diet and exercise extensively Recommended to continue current medication Follow up on Januvia and Ozempic PAP  Asthma (Goal: control symptoms and prevent exacerbations) -Not assessed today -Current treatment  Albuterol HFA 2 puffs q6h prn Appropriate, Effective, Safe, Accessible  CAD (Goal: Slow progression of atherosclerosis (plaques / blockages) throughout your body to reduce risk of heart attack and strokes) -Not assessed today Current Medication Therapy:  Aspirin 81mg  1 qd Appropriate, Effective, Safe, Accessible    Gout (Goal: Prevent gout flares) -Not assessed today -Current treatment  Allopurinol 300mg  1 qd Appropriate, Effective, Safe, Accessible  Chronic  Pain (Goal: Pain control that allows for ADL) -Not assessed today -Current treatment  Diclofenac 1% gel 2g QID Appropriate, Effective, Safe, Accessible     Crystalle Popwell H Charo Philipp  Clinical Pharmacist [phone]

## 2023-03-12 ENCOUNTER — Ambulatory Visit: Payer: Medicare PPO

## 2023-03-15 ENCOUNTER — Ambulatory Visit: Payer: Medicare PPO

## 2023-03-15 ENCOUNTER — Telehealth: Payer: Self-pay

## 2023-03-15 DIAGNOSIS — E1159 Type 2 diabetes mellitus with other circulatory complications: Secondary | ICD-10-CM

## 2023-03-15 DIAGNOSIS — E785 Hyperlipidemia, unspecified: Secondary | ICD-10-CM

## 2023-03-15 DIAGNOSIS — I10 Essential (primary) hypertension: Secondary | ICD-10-CM

## 2023-03-15 DIAGNOSIS — E876 Hypokalemia: Secondary | ICD-10-CM

## 2023-03-15 MED ORDER — AMLODIPINE BESYLATE 10 MG PO TABS: 10.0000 mg | ORAL_TABLET | Freq: Every day | ORAL | 2 refills | Status: DC

## 2023-03-15 MED ORDER — POTASSIUM CHLORIDE CRYS ER 20 MEQ PO TBCR: 20.0000 meq | EXTENDED_RELEASE_TABLET | Freq: Every day | ORAL | 3 refills | Status: DC

## 2023-03-15 MED ORDER — ALLOPURINOL 300 MG PO TABS: 300.0000 mg | ORAL_TABLET | Freq: Every day | ORAL | 2 refills | Status: DC

## 2023-03-15 MED ORDER — GLIMEPIRIDE 4 MG PO TABS: ORAL_TABLET | ORAL | 3 refills | Status: DC

## 2023-03-15 MED ORDER — ROSUVASTATIN CALCIUM 20 MG PO TABS: 20.0000 mg | ORAL_TABLET | Freq: Every day | ORAL | 3 refills | Status: DC

## 2023-03-15 MED ORDER — IRBESARTAN 75 MG PO TABS: 75.0000 mg | ORAL_TABLET | Freq: Every day | ORAL | 3 refills | Status: DC

## 2023-03-15 MED ORDER — METOPROLOL SUCCINATE ER 50 MG PO TB24: ORAL_TABLET | ORAL | 3 refills | Status: DC

## 2023-03-15 NOTE — Telephone Encounter (Signed)
-----   Message from Maren Reamer, [facility] sent at 03/12/2023  9:02 PM EDT ----- Regarding: [place] Pt has agreed to enroll in [place] for med sync and delivery. Can new prescriptions for all of the prescribed medications (except Jardiance - PAP) on his profile please be sent to the pharmacy to continue enrollment?  [facility] - [place], [place] - [address]  Phone: [phone] Fax: [phone]   Thank you! [place] Pharmacist [phone]

## 2023-03-15 NOTE — Progress Notes (Signed)
  Care Management & Coordination Services Pharmacy Note  03/15/2023 Name:  Charles [name] MRN:  [id number] DOB:  12-02-1951  Pt comes into today to provide income information to complete his ozempic PAP. Faxed into company today, pending response.  One box of Ozempic 0.25mg /0.5mg  pen provided in office today, instructions on how to use reviewed with the patient.  Will f/u in 4 weeks to see how patient is tolerating and remind him to increase to 0.5mg  dose if tolerating 0.25mg  dose well.  Was attempting to pursue Januvia PAP but [employer] reports they are behind on applications dating back to end of January.   [place] Pharmacist [phone]

## 2023-04-05 ENCOUNTER — Telehealth: Payer: Self-pay

## 2023-04-05 NOTE — Progress Notes (Signed)
Care Management & Coordination Services Pharmacy Team  Reason for Encounter: Januvia Call from Patient     What concerns do you have about your medications? Patient reports he has received notification and medication from [facility] that his approved for Januvia was mailed a 81 DS he states it was sent to his old address where his nephew resides.  He Further reports that he is planning to come and pick up his Ozempic from the office soon, is coordinating transportation he reports he has one more pen left of the sample he has received. He states he will call the day he is planning to come by in the event he needs to run in and get it quickly. He confirmed that he may store the medication in the fridge after opening.   Advised him I would call them and update to his address on file he verified is correct for next shipment and to hold medication until further clarification received on his regimen. Per Johny Drilling D he should hold and continue on Ozempic for now and they will reassess regimen if needed at next follow up appointment.  Call to [facility] submitted an address change for the pt for future deliveries Patient aware and in agreement with the above.  The patient denies side effects with their medications.   Chart Updates:  Recent office visits:  None  Recent consult visits:  None   Hospital visits:  None in previous 6 months  Medications: Outpatient Encounter Medications as of 04/05/2023  Medication Sig   Accu-Chek FastClix Lancets MISC TEST BLOOD SUGARS TWO TIMES DAILY.   albuterol (VENTOLIN HFA) 108 (90 Base) MCG/ACT inhaler Inhale 2 puffs into the lungs every 6 (six) hours as needed for wheezing or shortness of breath.   Alcohol Swabs (B-D SINGLE USE SWABS REGULAR) PADS 1 Device by Does not apply route daily.   allopurinol (ZYLOPRIM) 300 MG tablet Take 1 tablet (300 mg total) by mouth daily.   amLODipine (NORVASC) 10 MG tablet Take 1 tablet (10 mg total) by mouth daily.   aspirin 81  MG tablet Take 81 mg by mouth every evening.    diclofenac Sodium (VOLTAREN) 1 % GEL Apply 2 g topically 4 (four) times daily.   empagliflozin (JARDIANCE) 25 MG TABS tablet Take 1 tablet (25 mg total) by mouth daily before breakfast.   glimepiride (AMARYL) 4 MG tablet TAKE 1 TABLET EVERY DAY BEFORE BREAKFAST   glucose blood (ACCU-CHEK GUIDE) test strip TEST BLOOD SUGAR 1 TO 2 TIMES DAILY   irbesartan (AVAPRO) 75 MG tablet Take 1 tablet (75 mg total) by mouth daily.   metoprolol succinate (TOPROL-XL) 50 MG 24 hr tablet TAKE 1 TABLET EVERY DAY WITH A MEAL OR IMMEDIATELY FOLLOWING A MEAL   potassium chloride SA (KLOR-CON M) 20 MEQ tablet Take 1 tablet (20 mEq total) by mouth daily.   rosuvastatin (CRESTOR) 20 MG tablet Take 1 tablet (20 mg total) by mouth daily.   triamcinolone cream (KENALOG) 0.1 % Apply 2 times daily, small amount on right hand for 2 weeks then 1-2 times per week.   No facility-administered encounter medications on file as of 04/05/2023.    Recent vitals BP Readings from Last 3 Encounters:  03/03/23 126/70  12/16/22 128/80  11/02/22 128/70   Pulse Readings from Last 3 Encounters:  03/03/23 88  12/16/22 80  11/02/22 86   Wt Readings from Last 3 Encounters:  03/03/23 259 lb 2 oz (117.5 kg)  12/16/22 256 lb 2 oz (116.2  kg)  11/02/22 258 lb (117 kg)   BMI Readings from Last 3 Encounters:  03/03/23 41.82 kg/m  12/16/22 41.34 kg/m  11/02/22 41.64 kg/m    Recent lab results    Component Value Date/Time   NA 137 03/03/2023 1012   NA 138 04/15/2017 0000   K 3.8 03/03/2023 1012   CL 104 03/03/2023 1012   CO2 24 03/03/2023 1012   GLUCOSE 147 (H) 03/03/2023 1012   BUN 23 03/03/2023 1012   BUN 18 04/15/2017 0000   CREATININE 1.34 03/03/2023 1012   CREATININE 1.19 09/18/2020 0811   CALCIUM 9.7 03/03/2023 1012    Lab Results  Component Value Date   CREATININE 1.34 03/03/2023   GFR 53.42 (L) 03/03/2023   GFRNONAA >60 05/25/2022   GFRAA 72 09/18/2020   Lab  Results  Component Value Date/Time   HGBA1C 8.0 (A) 12/16/2022 09:23 AM   HGBA1C 8.5 (A) 09/04/2022 08:40 AM   HGBA1C 8.4 (A) 09/10/2020 11:06 AM   HGBA1C 7.6 (H) 09/05/2019 07:31 AM   HGBA1C 7.8 (H) 02/03/2019 10:25 AM   HGBA1C 7.9 04/15/2017 12:00 AM   MICROALBUR 0.8 12/16/2022 10:14 AM   MICROALBUR 3.1 (H) 09/04/2022 09:29 AM    Lab Results  Component Value Date   CHOL 112 09/04/2022   HDL 39.10 09/04/2022   LDLCALC 57 09/04/2022   TRIG 79.0 09/04/2022   CHOLHDL 3 09/04/2022       Pamala Duffel CMA Clinical Pharmacist Assistant [phone]

## 2023-04-08 ENCOUNTER — Telehealth: Payer: Self-pay

## 2023-04-08 NOTE — Progress Notes (Cosign Needed)
Patient ID: Charles [name], male   DOB: August 03, 1951, 72 y.o.   MRN: [id number]  Received call from patient to inquire on his medication delivery as he is out of Allopurinol, [facility] delivery of Allopurinol , Asprin and Amlodipine to be delivered to patient this afternoon after 3 pm Delivery will be short filled to line up with his medication odder of packaging in June. Call back left voicemail with the above information to advise patient included my contact information in the event he has any questions.   Pamala Duffel CMA Clinical Pharmacist Assistant [phone]

## 2023-04-13 ENCOUNTER — Telehealth: Payer: Self-pay | Admitting: Pharmacy Technician

## 2023-04-13 ENCOUNTER — Telehealth: Payer: Self-pay

## 2023-04-13 DIAGNOSIS — Z5986 Financial insecurity: Secondary | ICD-10-CM

## 2023-04-13 NOTE — Progress Notes (Signed)
[facility] Customer service manager [facility])                                            [facility] Quality Pharmacy Team    04/13/2023  Charles [name] [birth year] [id number]  Care coordination call placed to [employer] in regard to [facility] application.   Spoke to [place] who informs patient is APPROVED 03/29/23-12/21/23. Initial shipment will automatically ship out to patient's address on the application. For subsequent refills, Patient will need to call [employer] to reorder the medication and then it will be shipped to his home address listed on the application. Patient was informed to call [employer] if address has changed and phone number was provided to patient.  Annamaria Salah P. Dorotea Hand, CPhT [facility] [employer]  [phone]

## 2023-04-13 NOTE — Progress Notes (Signed)
Patient ID: Charles [name], male   DOB: Apr 18, 1951, 72 y.o.   MRN: [id number]  Care Management & Coordination Services Pharmacy Team  Reason for Encounter: Diabetes  Contacted patient to discuss diabetes disease state. Spoke with patient on 04/13/2023   Current antihyperglycemic regimen:  Jardiance  1 qd  Glimepiride  1 qd before breakfast  Januvia  1 qd (not taking) Ozempic 0.5mg  once weekly    Patient verbally confirms he is taking the above medications as directed. Yes  What diet changes have been made to improve diabetes control? Patient reports he has not really been eating as much as he hadn't been feeling as hungry on the 0.25 mg dose, he thinks his appetite Is a little better since increasing to the 0.5 mg dose.  What recent interventions/DTPs have been made to improve glycemic control:  Patent reports he has been tolerating the Ozempic well had some nausea at first but has subsided completely, he states he as instructed has increased to the 0.5 mg dose as of this week.  Have there been any recent hospitalizations or ED visits since last visit with PharmD? No  Patient reports hypoglycemic symptoms, including Shaky Patient reports low of 77 last week and he really had not eaten at all that day states he had some candy and his sugar increased to about 90 after that.  Patient denies hyperglycemic symptoms, including none  How often are you checking your blood sugar? 3-4 times daily  What are your blood sugars ranging?  After meals: 93, 88 , 77, 90, 113 patient reports 113 has been  the highest it has been.   During the week, how often does your blood glucose drop below 70? Never  Patient further reports he was able to get to the office and pick up his samples of Ozempic that included pen needles. He also states he received a call stating that his address was not correct with [employer] for his patient assistance, reassured him that it was changed at our prior  conversation and reminded him he is not to take the medication until he hears to do so from PCP or Pharmacist. He was in agreement. Call back to [facility] to confirm that it is indeed accurate the address on file with the application submitted this year and it was verified as correct to be shipped to his [address] address. Noted records to reorder Januvia after follow up with pharmacist if still needed.  Adherence Review: Is the patient currently on a STATIN medication? Yes Is the patient currently on ACE/ARB medication? Yes Does the patient have >5 day gap between last estimated fill dates? No   Chart Updates:  Recent office visits:  None   Recent consult visits:  None   Hospital visits:  None in previous 6 months  Medications: Outpatient Encounter Medications as of 04/13/2023  Medication Sig   Accu-Chek FastClix Lancets MISC TEST BLOOD SUGARS TWO TIMES DAILY.   albuterol (VENTOLIN HFA) 108 (90 Base) MCG/ACT inhaler Inhale 2 puffs into the lungs every 6 (six) hours as needed for wheezing or shortness of breath.   Alcohol Swabs (B-D SINGLE USE SWABS REGULAR) PADS 1 Device by Does not apply route daily.   allopurinol (ZYLOPRIM) 300 MG tablet Take 1 tablet (300 mg total) by mouth daily.   amLODipine (NORVASC) 10 MG tablet Take 1 tablet (10 mg total) by mouth daily.   aspirin 81 MG tablet Take 81 mg by mouth every evening.    diclofenac Sodium (VOLTAREN)  1 % GEL Apply 2 g topically 4 (four) times daily.   empagliflozin (JARDIANCE) 25 MG TABS tablet Take 1 tablet (25 mg total) by mouth daily before breakfast.   glimepiride (AMARYL) 4 MG tablet TAKE 1 TABLET EVERY DAY BEFORE BREAKFAST   glucose blood (ACCU-CHEK GUIDE) test strip TEST BLOOD SUGAR 1 TO 2 TIMES DAILY   irbesartan (AVAPRO) 75 MG tablet Take 1 tablet (75 mg total) by mouth daily.   metoprolol succinate (TOPROL-XL) 50 MG 24 hr tablet TAKE 1 TABLET EVERY DAY WITH A MEAL OR IMMEDIATELY FOLLOWING A MEAL   potassium chloride SA  (KLOR-CON M) 20 MEQ tablet Take 1 tablet (20 mEq total) by mouth daily.   rosuvastatin (CRESTOR) 20 MG tablet Take 1 tablet (20 mg total) by mouth daily.   triamcinolone cream (KENALOG) 0.1 % Apply 2 times daily, small amount on right hand for 2 weeks then 1-2 times per week.   No facility-administered encounter medications on file as of 04/13/2023.    Recent Relevant Labs: Lab Results  Component Value Date/Time   HGBA1C 8.0 (A) 12/16/2022 09:23 AM   HGBA1C 8.5 (A) 09/04/2022 08:40 AM   HGBA1C 8.4 (A) 09/10/2020 11:06 AM   HGBA1C 7.6 (H) 09/05/2019 07:31 AM   HGBA1C 7.8 (H) 02/03/2019 10:25 AM   HGBA1C 7.9 04/15/2017 12:00 AM   MICROALBUR 0.8 12/16/2022 10:14 AM   MICROALBUR 3.1 (H) 09/04/2022 09:29 AM    Kidney Function Lab Results  Component Value Date/Time   CREATININE 1.34 03/03/2023 10:12 AM   CREATININE 1.44 12/16/2022 10:14 AM   CREATININE 1.19 09/18/2020 08:11 AM   CREATININE 1.27 (H) 02/27/2016 12:01 AM   GFR 53.42 (L) 03/03/2023 10:12 AM   GFRNONAA >60 05/25/2022 01:48 PM   GFRNONAA 62 09/18/2020 08:11 AM   GFRAA 72 09/18/2020 08:11 AM    Star Rating Drugs:   Glimepiride 4 mg - Last filled 02/25/23 90 DS at [facility] Irbesartan 75 mg - Last filled 01/29/23 90 DS at [facility] Rosuvastatin 20 mg - Last filled 02/19/23 90 DS at [facility]   Care Gaps: COVID Booster - Overdue Zoster Vaccine - Overdue Eye Exam - Overdue AWV - 09/01/22    Patient Assistance: Novo- ozempic [place] [facility] - Jardiance  Pamala Duffel [facility] Clinical Pharmacist Assistant [phone]

## 2023-05-28 ENCOUNTER — Telehealth: Payer: Self-pay

## 2023-05-28 NOTE — Progress Notes (Signed)
Care Management & Coordination Services Pharmacy Team  Reason for Encounter: Medication coordination and delivery  Contacted patient to discuss medications and coordinate delivery from [facility]. Spoke with patient on 05/28/2023  Cycle dispensing form sent to [facility] for review.  Patient is due for next adherence delivery on: 06/09/23  This delivery to include: Adherence Packaging  30 Days  Allopurinol (Zyloprim) 300 mg - Take one tab at Breakfast Amlodipine (Norvasc) 10 mg - Take one tab at Bedtime Asprin 81 mg - Take one tab at Bedtime Diclofenac Gel- PRN Glimepiride (Amaryl) 4 mg - Take one tab at Breakfast Metoprolol XL 50 mg- Take one tab at Breakfast Potassium Chloride (Klor-Con )20 meq - Take one tab at Breakfast and one tab at Dinner Irbesartan (Avapro) 75 mg- Take one tab at Breakfast Rosuvastatin (Crestor) 20 mg - Take one tab at Bedtime glimepiride 4 mg tablet 05/04/2023 90   Accu-Chek Guide test strips 04/07/2023 90   irbesartan 75 mg tablet 04/07/2023 90   Accu-Chek Fastclix Lancet Drum 04/07/2023 90   metoprolol succinate ER 50 mg tablet,extended release 24 hr 04/07/2023 90   pioglitazone 15 mg tablet 04/30/2022 90   potassium chloride ER 20 mEq tablet,extended release(part/cryst) 05/06/2023 90   rosuvastatin 20 mg tablet 05/24/2023 90   Patient declined the following medications this month: (all recently filled with [facility]) Diclofenac Gel- PRN Glimepiride (Amaryl) 4 mg - Take one tab at Breakfast Metoprolol XL 50 mg- Take one tab at Breakfast Potassium Chloride (Klor-Con )20 meq - Take one tab at Breakfast and one tab at Dinner Irbesartan (Avapro) 75 mg- Take one tab at Breakfast Rosuvastatin (Crestor) 20 mg - Take one tab at Bedtime  No refill request needed.  Confirmed delivery date of 06/09/23, advised patient that pharmacy will contact them the morning of delivery.  Notes: Patient inquired on if he should continue to accept [place]  deliveries as he is not using this medication. Advised he has not yet followed up with PCP on regimen. Forwarded to Pharm for assistance in scheduling with PCP. Patient has transportation barrier to appointments, advised him that [facility] offers [facility] at no cost to him for medical appointments when he is needing and he is in agreement.  Any concerns about your medications? No Patient states he will call [facility] on today and deactivate his account with them as they still contact him through automated system.  How often do you forget or accidentally miss a dose? Rarely  Do you use a pillbox? No  Is patient in packaging Yes    Chart review: Recent office visits:  None  Recent consult visits:  None   Hospital visits:  None in previous 6 months  Medications: Outpatient Encounter Medications as of 05/28/2023  Medication Sig   Accu-Chek FastClix Lancets MISC TEST BLOOD SUGARS TWO TIMES DAILY.   albuterol (VENTOLIN HFA) 108 (90 Base) MCG/ACT inhaler Inhale 2 puffs into the lungs every 6 (six) hours as needed for wheezing or shortness of breath.   Alcohol Swabs (B-D SINGLE USE SWABS REGULAR) PADS 1 Device by Does not apply route daily.   allopurinol (ZYLOPRIM) 300 MG tablet Take 1 tablet (300 mg total) by mouth daily.   amLODipine (NORVASC) 10 MG tablet Take 1 tablet (10 mg total) by mouth daily.   aspirin 81 MG tablet Take 81 mg by mouth every evening.    diclofenac Sodium (VOLTAREN) 1 % GEL Apply 2 g topically 4 (four) times daily.   empagliflozin (JARDIANCE) 25 MG TABS  tablet Take 1 tablet (25 mg total) by mouth daily before breakfast.   glimepiride (AMARYL) 4 MG tablet TAKE 1 TABLET EVERY DAY BEFORE BREAKFAST   glucose blood (ACCU-CHEK GUIDE) test strip TEST BLOOD SUGAR 1 TO 2 TIMES DAILY   irbesartan (AVAPRO) 75 MG tablet Take 1 tablet (75 mg total) by mouth daily.   metoprolol succinate (TOPROL-XL) 50 MG 24 hr tablet TAKE 1 TABLET EVERY DAY WITH A MEAL OR IMMEDIATELY FOLLOWING A MEAL    potassium chloride SA (KLOR-CON M) 20 MEQ tablet Take 1 tablet (20 mEq total) by mouth daily.   rosuvastatin (CRESTOR) 20 MG tablet Take 1 tablet (20 mg total) by mouth daily.   triamcinolone cream (KENALOG) 0.1 % Apply 2 times daily, small amount on right hand for 2 weeks then 1-2 times per week.   No facility-administered encounter medications on file as of 05/28/2023.   BP Readings from Last 3 Encounters:  03/03/23 126/70  12/16/22 128/80  11/02/22 128/70    Pulse Readings from Last 3 Encounters:  03/03/23 88  12/16/22 80  11/02/22 86    Lab Results  Component Value Date/Time   HGBA1C 8.0 (A) 12/16/2022 09:23 AM   HGBA1C 8.5 (A) 09/04/2022 08:40 AM   HGBA1C 8.4 (A) 09/10/2020 11:06 AM   HGBA1C 7.6 (H) 09/05/2019 07:31 AM   HGBA1C 7.8 (H) 02/03/2019 10:25 AM   HGBA1C 7.9 04/15/2017 12:00 AM   Lab Results  Component Value Date   CREATININE 1.34 03/03/2023   BUN 23 03/03/2023   GFR 53.42 (L) 03/03/2023   GFRNONAA >60 05/25/2022   GFRAA 72 09/18/2020   NA 137 03/03/2023   K 3.8 03/03/2023   CALCIUM 9.7 03/03/2023   CO2 24 03/03/2023      Pamala Duffel CMA Clinical Pharmacist Assistant [phone]

## 2023-06-01 NOTE — Progress Notes (Unsigned)
Care Management & Coordination Services Pharmacy Note  06/01/2023 Name:  Charles [name] MRN:  [id number] DOB:  1951/03/28  Summary: -A1C not at goal <7 (8 on 12/16/22) - has since started Jardiance 25mg  -Pursuing PAP for Januvia/Ozempic. Patient plans to bring proof of income on 3/25 to office to complete application -LDL at goal <70, been w/out rosuvastatin x 3 weeks due to delivery issues w/ [facility] -BP at goal <130/80 in office, no cuff to check at home -Lack of transportation has prevented him getting to medical appts  Recommendations/Changes made from today's visit: -Continue to check sugars at least twice daily -Provide ozempic samples in office on Monday, with PCP approval -ENROLL in [facility] med sync and delivery -Provided information to [facility] ([facility]) for free transport to medical services; Instructed patient to contact [facility] customer service to discuss their transport benefits  Follow up plan: Monthly dispensing and adherence calls DM call in 1 and 3 months Pharmacist visit in 3 months   Subjective: Charles [name] is an 72 y.o. year old male who is a primary patient of [place], Timoteo Expose, MD.  The care coordination team was consulted for assistance with disease management and care coordination needs.    Engaged with patient by telephone for initial visit.  Recent office visits: 03/03/23 [place], [name], MD - Patient presented for right shoulder pain unspecified chronicity and other concerns. Prescribed Diclofenac. Prescribed Triamcinolone. Changed Rosuvastatin. Stopped Januvia.   11/2722 [place], [name], MD - Patient presented for Type 2 diabetes mellitus with vascular disease and other concerns. No medication changes.    11/02/22 [place], [name], MD - Patient presented for Type 2 diabetes mellitus with vascular disease and other concerns. No medication changes.  Recent consult visits: None  Hospital visits: None in previous 6  months   Objective:  Lab Results  Component Value Date   CREATININE 1.34 03/03/2023   BUN 23 03/03/2023   GFR 53.42 (L) 03/03/2023   GFRNONAA >60 05/25/2022   GFRAA 72 09/18/2020   NA 137 03/03/2023   K 3.8 03/03/2023   CALCIUM 9.7 03/03/2023   CO2 24 03/03/2023   GLUCOSE 147 (H) 03/03/2023    Lab Results  Component Value Date/Time   HGBA1C 8.0 (A) 12/16/2022 09:23 AM   HGBA1C 8.5 (A) 09/04/2022 08:40 AM   HGBA1C 8.4 (A) 09/10/2020 11:06 AM   HGBA1C 7.6 (H) 09/05/2019 07:31 AM   HGBA1C 7.8 (H) 02/03/2019 10:25 AM   HGBA1C 7.9 04/15/2017 12:00 AM   GFR 53.42 (L) 03/03/2023 10:12 AM   GFR 49.07 (L) 12/16/2022 10:14 AM   MICROALBUR 0.8 12/16/2022 10:14 AM   MICROALBUR 3.1 (H) 09/04/2022 09:29 AM    Last diabetic Eye exam:  Lab Results  Component Value Date/Time   HMDIABEYEEXA No Retinopathy 03/26/2022 12:00 AM    Last diabetic Foot exam: No results found for: "HMDIABFOOTEX"   Lab Results  Component Value Date   CHOL 112 09/04/2022   HDL 39.10 09/04/2022   LDLCALC 57 09/04/2022   TRIG 79.0 09/04/2022   CHOLHDL 3 09/04/2022       Latest Ref Rng & Units 05/25/2022    1:48 PM 05/04/2022    9:03 AM 09/23/2021    9:29 AM  Hepatic Function  Total Protein 6.5 - 8.1 g/dL 7.9  7.9  7.8   Albumin 3.5 - 5.0 g/dL 3.8  4.4  4.2   AST 15 - 41 U/L 19  18  20    ALT  0 - 44 U/L 18  18  18    Alk Phosphatase 38 - 126 U/L 72  79  76   Total Bilirubin 0.3 - 1.2 mg/dL 0.7  0.4  0.4     Lab Results  Component Value Date/Time   TSH 3.14 12/16/2022 10:14 AM   TSH 3.93 11/21/2019 09:41 AM       Latest Ref Rng & Units 12/16/2022   10:14 AM 05/25/2022    1:48 PM 12/01/2021    2:37 PM  CBC  WBC 4.0 - 10.5 K/uL 7.5  12.4  9.0   Hemoglobin 13.0 - 17.0 g/dL 16.1  09.6  04.5   Hematocrit 39.0 - 52.0 % 43.8  43.5  45.6   Platelets 150.0 - 400.0 K/uL 205.0  186  210     Lab Results  Component Value Date/Time   VITAMINB12 476 11/06/2016 12:00 AM    Clinical ASCVD: Yes  The  ASCVD Risk score (Arnett DK, et al., 2019) failed to calculate for the following reasons:   The valid total cholesterol range is 130 to 320 mg/dL        03/29/8118    1:47 AM 12/16/2022    9:23 AM 09/04/2022    9:41 AM  Depression screen PHQ 2/9  Decreased Interest 0 0 0  Down, Depressed, Hopeless 0 1 0  PHQ - 2 Score 0 1 0  Altered sleeping  1   Tired, decreased energy  1   Change in appetite  1   Feeling bad or failure about yourself   0   Trouble concentrating  0   Moving slowly or fidgety/restless  0   Suicidal thoughts  0   PHQ-9 Score  4   Difficult doing work/chores  Not difficult at all      Social History   Tobacco Use  Smoking Status Former   Packs/day: 1.00   Years: 30.00   Additional pack years: 0.00   Total pack years: 30.00   Types: Cigarettes   Quit date: 07/31/1995   Years since quitting: 27.8  Smokeless Tobacco Never   BP Readings from Last 3 Encounters:  03/03/23 126/70  12/16/22 128/80  11/02/22 128/70   Pulse Readings from Last 3 Encounters:  03/03/23 88  12/16/22 80  11/02/22 86   Wt Readings from Last 3 Encounters:  03/03/23 259 lb 2 oz (117.5 kg)  12/16/22 256 lb 2 oz (116.2 kg)  11/02/22 258 lb (117 kg)   BMI Readings from Last 3 Encounters:  03/03/23 41.82 kg/m  12/16/22 41.34 kg/m  11/02/22 41.64 kg/m    Allergies  Allergen Reactions   Penicillins Other (See Comments)    Per pt blacked out after injection as a child.  Did it involve swelling of the face/tongue/throat, SOB, or low BP? N Did it involve sudden or severe rash/hives, skin peeling, or any reaction on the inside of your mouth or nose? N Did you need to seek medical attention at a hospital or doctor's office? N When did it last happen?   Several Decades Ago    If all above answers are "NO", may proceed with cephalosporin use.     Medications Reviewed Today     Reviewed by Sherrill Raring, RPH (Pharmacist) on 03/12/23 at 1251  Med List Status: <None>    Medication Order Taking? Sig Documenting Provider Last Dose Status Informant  Accu-Chek FastClix Lancets MISC [id number]  TEST BLOOD SUGARS TWO TIMES DAILY. [place], [name], MD  Active  albuterol (VENTOLIN HFA) 108 (90 Base) MCG/ACT inhaler [id number] Yes Inhale 2 puffs into the lungs every 6 (six) hours as needed for wheezing or shortness of breath. [place], [name], MD Taking Active Self, Pharmacy Records  Alcohol Swabs (B-D SINGLE USE SWABS REGULAR) PADS [id number] Yes 1 Device by Does not apply route daily. Romero Belling, MD Taking Active Self, Pharmacy Records  allopurinol [facility]) 300 MG tablet [id number] Yes TAKE 1 TABLET EVERY DAY [place], [name], MD Taking Active   amLODipine (NORVASC) 10 MG tablet [id number] Yes TAKE 1 TABLET EVERY DAY [place], [name], MD Taking Active   aspirin 81 MG tablet [id number] Yes Take 81 mg by mouth every evening.  [provider] Taking Active Self, Pharmacy Records  diclofenac Sodium (VOLTAREN) 1 % GEL [id number]  Apply 2 g topically 4 (four) times daily. [place], [name], MD  Active   empagliflozin (JARDIANCE) 25 MG TABS tablet [id number] Yes Take 1 tablet (25 mg total) by mouth daily before breakfast. [place], [name], MD Taking Active   glimepiride (AMARYL) 4 MG tablet [id number] Yes TAKE 1 TABLET EVERY DAY BEFORE BREAKFAST [place], [name], MD Taking Active   glucose blood (ACCU-CHEK GUIDE) test strip [id number] Yes TEST BLOOD SUGAR 1 TO 2 TIMES DAILY [place], [name], MD Taking Active   irbesartan (AVAPRO) 75 MG tablet [id number] Yes TAKE 1 TABLET EVERY DAY [place], [name], MD Taking Active   metoprolol succinate (TOPROL-XL) 50 MG 24 hr tablet [id number] Yes TAKE 1 TABLET EVERY DAY WITH A MEAL OR IMMEDIATELY FOLLOWING A MEAL [place], [name], MD Taking Active   potassium chloride SA (KLOR-CON M) 20 MEQ tablet [id number] Yes TAKE 1 TABLET EVERY DAY [place], [name], MD Taking Active   rosuvastatin (CRESTOR) 20 MG tablet [id number]  Take 1 tablet (20 mg total) by  mouth daily. [place], [name], MD  Active   triamcinolone cream (KENALOG) 0.1 % [id number]  Apply 2 times daily, small amount on right hand for 2 weeks then 1-2 times per week. [place], [name], MD  Active             SDOH:  (Social Determinants of Health) assessments and interventions performed: Yes SDOH Interventions    Flowsheet Row Care Coordination from 03/12/2023 in [facility] Care Coordination from 09/07/2022 in [facility] Care Coordination Clinical Support from 09/01/2022 in [facility] [place]  SDOH Interventions     Food Insecurity Interventions -- -- Intervention Not Indicated  Housing Interventions -- Intervention Not Indicated --  Transportation Interventions Other (Comment)  [Provided info for [facility] to patient and also made aware that his insurance [employer] offers transport services] Intervention Not Indicated Intervention Not Indicated  Utilities Interventions Intervention Not Indicated -- --  Financial Strain Interventions -- -- Intervention Not Indicated  Physical Activity Interventions -- -- Patient Refused, Other (Comments)  Stress Interventions -- -- Intervention Not Indicated       Medication Assistance:  Jardiance approved through 2024. Ozempic pap pending. Januvia pap pending.  Medication Access: Within the past 30 days, how often has patient missed a dose of medication? Januvia, Jardiance, and Rosuvastatin due to [facility] filling issues Is a pillbox or other method used to improve adherence? No  Factors that may affect medication adherence? financial need and transportation problems Are meds synced by current pharmacy? No  Are meds delivered by current pharmacy? Yes  Does patient experience delays in picking up medications due to transportation concerns? No  [facility] Reviewed: Is patient disadvantaged to use [facility]?: Yes -currently all meds not being pursued through PAP are same  cost at [facility] ($0) Current Rx insurance plan: [employer] Name and location of Current pharmacy:  [facility] - [place], [place] - [address] [place] [address] Phone: [phone] Fax: [phone]  [facility] [phone] - [place], [place] - [address] AT CORNER OF [facility] [address] [place] [address] Phone: [phone] Fax: [phone]  [facility] - [place], [place] - [address] [place] [address] Phone: [phone] Fax: [phone]  [facility] services reviewed with patient today?: Yes  Patient requests to transfer care to [facility]?: Yes    Compliance/Adherence/Medication fill history: Care Gaps: COVID/Shingles Vaccines  Star-Rating Drugs: Jardiance 25mg  PDC 51% - PAP Glimepiride 4mg  PDC 100% Irbesartan 75mg  PDC 68% Rosuvastatin 20mg  PDC 100%   Assessment/Plan Hypertension (BP goal <130/80) -Controlled -Current treatment: Amlodipine 10mg  1 qd Appropriate, Effective, Safe, Accessible Irbesartan 75mg  1 qd Appropriate, Effective, Safe, Accessible Metoprolol XL 50mg  1 qd Appropriate, Effective, Safe, Accessible -Medications previously tried: Carvedilol, Lisinopril, Losartan, Spironolactone -Current home readings: No BP cuff to check -Current dietary habits: Mindful of salt intake -Current exercise habits: not discussed today -Denies hypotensive/hypertensive symptoms -Educated on Daily salt intake goal < 2300 mg; -Counseled to monitor BP at home once weekly once cuff is obtained, document, and provide log at future appointments -Recommended to continue current medication   Diabetes (A1c goal <7%) -Uncontrolled -Current medications: Jardiance 25mg  1 qd Appropriate, Effective, Safe, Accessible (Previously Inaccessible, just approved through PAP) Glimepiride 4mg  1 qd before breakfast Appropriate, Effective,  Safe, Accessible Januvia 100mg  1 qd Appropriate, Query Effective Ozempic 0.25mg  once weekly Appropriate, Query Effective -A1C not checked since starting Jardiance. Still not started Januvia or Ozempic while pursuing PAP.  -Medications previously tried: Trulicity, Metformin, Actos, Toujeo -Current home glucose readings Reports readings in 160s fasting and in 200s postprandial -Denies hypoglycemic/hyperglycemic symptoms -Current meal patterns:  Not discussed today in specifics, patient reports he is trying to eat as healthy as he [name] as he waits for PAP approval for his medications -Educated on A1c and blood sugar goals; Complications of diabetes including kidney damage, retinal damage, and cardiovascular disease; -Counseled to check feet daily and get yearly eye exams -Counseled on diet and exercise extensively Recommended to continue current medication Follow up on Januvia and Ozempic PAP   Sherrill Raring Clinical Pharmacist [phone]

## 2023-06-04 ENCOUNTER — Telehealth: Payer: Self-pay

## 2023-06-04 ENCOUNTER — Ambulatory Visit: Payer: Medicare PPO | Admitting: Family Medicine

## 2023-06-04 ENCOUNTER — Ambulatory Visit: Payer: Self-pay

## 2023-06-04 ENCOUNTER — Other Ambulatory Visit: Payer: Medicare PPO

## 2023-06-04 ENCOUNTER — Telehealth: Payer: Self-pay | Admitting: *Deleted

## 2023-06-04 DIAGNOSIS — E1159 Type 2 diabetes mellitus with other circulatory complications: Secondary | ICD-10-CM

## 2023-06-04 DIAGNOSIS — I10 Essential (primary) hypertension: Secondary | ICD-10-CM

## 2023-06-04 DIAGNOSIS — E118 Type 2 diabetes mellitus with unspecified complications: Secondary | ICD-10-CM

## 2023-06-04 NOTE — Patient Outreach (Signed)
  Care Coordination   Follow Up Visit Note   06/04/2023 Name: Charles [name] MRN: [id number] DOB: 09-Sep-1951  Charles [name] is a 72 y.o. year old male who sees Charles [name], Charles Expose, MD for primary care. I spoke with  Charles [name] by phone today.  What matters to the patients health and wellness today?  Patient needs resources for transportation    Goals Addressed             This Visit's Progress    Care Coordination Activities       Care Coordination Interventions: Collaboration with Charles [name], [facility] who reports patient is in need of transportation assistance to his primary care providers office on 6/19 Contacted the patient to discuss resource needs. Patient reports he is unable to attend future office visit without transportation assistance Referral placed to community resource care guide team requesting assistance with transportation Advised the patient to  expect a call from this team; SW will follow up over the next week to assist with long-term transportation resource options         SDOH assessments and interventions completed:  No     Care Coordination Interventions:  Yes, provided   Interventions Today    Flowsheet Row Most Recent Value  Chronic Disease   Chronic disease during today's visit Hypertension (HTN), Diabetes  General Interventions   General Interventions Discussed/Reviewed General Interventions Discussed, Programmer, applications, Communication with, Doctor Visits  Doctor Visits Discussed/Reviewed Doctor Visits Reviewed  Communication with Pharmacists        Follow up plan:  SW will continue to follow    Encounter Outcome:  Pt. Visit Completed   Bevelyn Ngo, Kenard Gower, CDP Social Worker, Certified Dementia Practitioner [facility] Care Management  Care Coordination [phone]

## 2023-06-04 NOTE — Telephone Encounter (Signed)
Telephone encounter was:  Successful.  06/04/2023 Name: ESAIAH DELBENE MRN: [id number] DOB: Nov 14, 1951  Charles [name] is a 72 y.o. year old male who is a primary care patient of [place], Timoteo Expose, MD . The community resource team was consulted for assistance with Transportation Needs   Care guide performed the following interventions: Patient provided with information about care guide support team and interviewed to confirm resource needs.Pt needs transportation for 6/19 patient is set up with ub[employer]/lift and i will call back monday morning to follow up with [employer]   Follow Up Plan:  Follow up Monday  NATHANAEL LOTZ DOB: 03/15/1951 MRN: [id number]   RIDER WAIVER AND RELEASE OF LIABILITY  For purposes of improving physical access to our facilities, [facility] is pleased to partner with third parties to provide [facility] patients or other authorized individuals the option of convenient, on-demand ground transportation services (the Chiropractor") through use of the technology service that enables users to request on-demand ground transportation from independent third-party providers.  By opting to use and accept these [employer], I, the undersigned, hereby agree on behalf of myself, and on behalf of any minor child using the Science writer for whom I am the parent or legal guardian, as follows:  Science writer provided to me are provided by independent third-party transportation providers who are not [employer] or employees and who are unaffiliated with [employer]. [place] is neither a transportation carrier nor a common or public carrier. [facility] has no control over the quality or safety of the transportation that occurs as a result of the [employer]. [facility] cannot guarantee that any third-party transportation provider will complete any arranged transportation service. [facility] makes no representation, warranty, or guarantee regarding  the reliability, timeliness, quality, safety, suitability, or availability of any of the Transport Services or that they will be error free. I fully understand that traveling by vehicle involves risks and dangers of serious bodily injury, including permanent disability, paralysis, and death. I agree, on behalf of myself and on behalf of any minor child using the Transport Services for whom I am the parent or legal guardian, that the entire risk arising out of my use of the [employer] remains solely with me, to the maximum extent permitted under applicable law. The [employer] are provided "as is" and "as available." [facility] disclaims all representations and warranties, express, implied or statutory, not expressly set out in these terms, including the implied warranties of merchantability and fitness for a particular purpose. I hereby waive and release [facility] [place] [facility], its agents, employees, officers, directors, representatives, insurers, attorneys, assigns, successors, subsidiaries, and affiliates from any and all past, present, or future claims, demands, liabilities, actions, causes of action, or suits of any kind directly or indirectly arising from acceptance and use of the [employer]. I further waive and release [facility] and its affiliates from all present and future liability and responsibility for any injury or death to persons or damages to property caused by or related to the use of the [employer]. I have read this Waiver and Release of Liability, and I understand the terms used in it and their legal significance. This Waiver is freely and voluntarily given with the understanding that my right (as well as the right of any minor child for whom I am the parent or legal guardian using the [employer]) to legal recourse against [place] in connection with the [employer] is  knowingly surrendered in return for use of these services.   I attest that I read  the consent document to [name], gave Mr. Dalto the opportunity to ask questions and answered the questions asked (if any). I affirm that [name] then provided consent for he's participation in this program.     Suzzanne Cloud [facility] Guide, [facility] [id number] [address] [place], [place], [place] [address] Phone: [phone] Email: [email or web address]

## 2023-06-04 NOTE — Patient Instructions (Signed)
Visit Information  Thank you for taking time to visit with me today. Please don't hesitate to contact me if I can be of assistance to you.   Following are the goals we discussed today:  - Engage with the community resource team regarding transportation for your appointment on 6/19  If you are experiencing a Mental Health or Behavioral Health Crisis or need someone to talk to, please call 1-800-273-TALK (toll free, 24 hour hotline) call 911  Patient verbalizes understanding of instructions and care plan provided today and agrees to view in MyChart. Active MyChart status and patient understanding of how to access instructions and care plan via MyChart confirmed with patient.     Bevelyn Ngo, BSW, CDP Social Worker, Certified Dementia Practitioner [facility] Care Management  Care Coordination [phone]

## 2023-06-06 NOTE — Progress Notes (Signed)
06/06/2023 Name: Charles [name] MRN: [id number] DOB: August 19, 1951  Chief Complaint  Patient presents with   Medication Management   Charles [name] is a 72 y.o. year old male who presented for a telephone visit to establish care with [facility] Pharm[facility]- previously followed by [facility] pharmacist   Subjective:  Care Team: Primary Care Provider: [place], [name], MD ; Next Scheduled Visit: 06/09/23  Medication Access/Adherence -Current Pharmacy:  [facility] -Patient reports affordability concerns with their medications: No  -Patient reports access/transportation concerns to their pharmacy: Yes  -Patient reports adherence concerns with their medications:  No    Diabetes:  Current medications: Jardiance 25mg  daily, glimepiride 4mg  daily, Ozempic 0.5mg  weekly -Current glucose readings: 80-120 -Using Accu Check Guide meter; testing daily -Patient reports hypoglycemic s/sx including dizziness, shakiness, sweating.  This only occurs rarely, and he states BG 60-70. -Patient denies hyperglycemic symptoms including polyuria, polydipsia, polyphagia, nocturia, neuropathy, blurred vision. -Current medication access support: PAP for Jardiance and Ozempic  Hypertension: Current medications: amlodipine 10mg  daily, irbesartan 75mg  daily, metoprolol xl 50mg  dialy  -Recent clinic BP 126/70  Hyperlipidemia/ASCVD Risk Reduction Current lipid lowering medications: rosuvastatin 20mg  daily  Antiplatelet regimen: ASA 81mg   Medication Management: Current adherence strategy: Receives medications via mail order/delivery from [facility] pharmacy due to transportation barrier -Patient reports Good adherence to medications -Patient reports the following barriers to adherence: transportation.  He also mentioned that transportation was set up through [facility] for his upcoming PCP appt 6/19, and he is hoping to still receive assistance with this. -Patient also states when he received last  delivery of medications from [facility] (2 medications), he was charged $2 that he was not expecting due to medications being no cost. -States diclofenac topical gel was prescribed recently for shoulder pain, but he has not used.  Packaging stated specifically not to use on shoulder, and he would like an xray for further evaluation.  Objective: Lab Results  Component Value Date   HGBA1C 8.0 (A) 12/16/2022   Lab Results  Component Value Date   CREATININE 1.34 03/03/2023   BUN 23 03/03/2023   NA 137 03/03/2023   K 3.8 03/03/2023   CL 104 03/03/2023   CO2 24 03/03/2023   Lab Results  Component Value Date   CHOL 112 09/04/2022   HDL 39.10 09/04/2022   LDLCALC 57 09/04/2022   TRIG 79.0 09/04/2022   CHOLHDL 3 09/04/2022   Medications Reviewed Today     Reviewed by [name], RPH (Pharmacist) on 06/06/23 at 1235  Med List Status: <None>   Medication Order Taking? Sig Documenting Provider Last Dose Status Informant  Accu-Chek FastClix Lancets MISC [id number] Yes TEST BLOOD SUGARS TWO TIMES DAILY. [place], [name], MD Taking Active   albuterol (VENTOLIN HFA) 108 [phone] Base) MCG/ACT inhaler [id number] Yes Inhale 2 puffs into the lungs every 6 (six) hours as needed for wheezing or shortness of breath. [place], [name], MD Taking Active Self, Pharmacy Records           Med Note Littie Deeds, [place] A   Fri Jun 04, 2023  2:21 PM) Only needs rarely  Alcohol Swabs (B-D SINGLE USE SWABS REGULAR) PADS [id number] Yes 1 Device by Does not apply route daily. Romero Belling, MD Taking Active Self, Pharmacy Records  allopurinol (ZYLOPRIM) 300 MG tablet [id number] Yes Take 1 tablet (300 mg total) by mouth daily. [place], [name], MD Taking Active   amLODipine (NORVASC) 10 MG tablet [id number] Yes Take 1 tablet (10 mg  total) by mouth daily. [place], [name], MD Taking Active   aspirin 81 MG tablet [id number] Yes Take 81 mg by mouth every evening.  [provider] Taking Active Self, Pharmacy Records   diclofenac Sodium (VOLTAREN) 1 % GEL [id number]  Apply 2 g topically 4 (four) times daily. [place], [name], MD  Active   empagliflozin (JARDIANCE) 25 MG TABS tablet [id number] Yes Take 1 tablet (25 mg total) by mouth daily before breakfast. [place], [name], MD Taking Active            Med Note Littie Deeds, [place] A   Fri Jun 04, 2023  2:12 PM) PAP  glimepiride (AMARYL) 4 MG tablet [id number] Yes TAKE 1 TABLET EVERY DAY BEFORE BREAKFAST [place], [name], MD Taking Active   glucose blood (ACCU-CHEK GUIDE) test strip [id number] Yes TEST BLOOD SUGAR 1 TO 2 TIMES DAILY [place], [name], MD Taking Active   irbesartan (AVAPRO) 75 MG tablet [id number] Yes Take 1 tablet (75 mg total) by mouth daily. [place], [name], MD Taking Active   metoprolol succinate (TOPROL-XL) 50 MG 24 hr tablet [id number] Yes TAKE 1 TABLET EVERY DAY WITH A MEAL OR IMMEDIATELY FOLLOWING A MEAL [place], [name], MD Taking Active   potassium chloride SA (KLOR-CON M) 20 MEQ tablet [id number] Yes Take 1 tablet (20 mEq total) by mouth daily. [place], [name], MD Taking Active   rosuvastatin (CRESTOR) 20 MG tablet [id number] Yes Take 1 tablet (20 mg total) by mouth daily. [place], [name], MD Taking Active   triamcinolone cream (KENALOG) 0.1 % [id number] Yes Apply 2 times daily, small amount on right hand for 2 weeks then 1-2 times per week. [place], [name], MD Taking Active            Assessment/Plan:   Diabetes: - Currently uncontrolled based on last A1c - Contacting BI to request refill for Jardiance on patient's behalf an d contacting [facility] to enroll patient in auto-refills if he is not alreaydy - Recommend to consider increasing Ozempic to 1mg  if A1c is not <7 at upcoming PCP visit - Recommend to check glucose daily and record  Hypertension: - Currently controlled - Continue current regimen and regular follow-up with providers  Hyperlipidemia/ASCVD Risk Reduction: - Currently controlled.  - Continue current regimen and regular follow-up  with providers  Medication Management: - Contacting [facility] in regard to upcoming shipment of medications to verify they are filling all currently needed medications and see what expected cost to patient will be. - Reach out to social work to assist with transportation to his upcoming appointment with Dr. [place] 6/19 - Will notify Dr. [place] that patient never started diclofenac for shoulder pain and would like an xray for further evaluation  Follow Up Plan: ***  [name], PharmD, DPLA

## 2023-06-08 ENCOUNTER — Telehealth: Payer: Self-pay | Admitting: *Deleted

## 2023-06-08 ENCOUNTER — Telehealth: Payer: Self-pay

## 2023-06-08 NOTE — Progress Notes (Signed)
Patient calling with questions regarding transportation set up for his PCP appointment tomorrow 06/08/23.  Contacted NT, Dione Booze that assisted with this matter and provided patient with her phone number to call.  [name], PharmD, DPLA

## 2023-06-08 NOTE — Progress Notes (Unsigned)
HPI: Charles [name] is a 72 y.o. male, who is here today for chronic disease management.  Last seen on 03/03/23, when he was evaluated for right shoulder pain. He states that the pain has worsened, and he is unable to lift his arm or wash his back with his right upper extremity. He describes a limited range of motion and significant discomfort in the right shoulder area, especially when laying on his right side. He has not noted edema or erythema.  Hyperlipidemia: Currently on Crestor 20 mg daily. Lab Results  Component Value Date   CHOL 112 09/04/2022   HDL 39.10 09/04/2022   LDLCALC 57 09/04/2022   TRIG 79.0 09/04/2022   CHOLHDL 3 09/04/2022   Hypertension: Dx'ed around 2008. Medications: Amlodipine 10 mg daily, Irbesartan 75 mg daily, Metoprolol Succinate 50 mg daily.  He does not have a BP monitor at home. Negative for unusual or severe headache, visual changes, exertional chest pain, dyspnea,  focal weakness, or edema.  Lab Results  Component Value Date   CREATININE 1.34 03/03/2023   BUN 23 03/03/2023   NA 137 03/03/2023   K 3.8 03/03/2023   CL 104 03/03/2023   CO2 24 03/03/2023   Diabetes Mellitus II:  Dx'ed around 2011.  - Checking BG at home: < 80 sometimes. In general low 100's. - Medications: Ozempic 0.5 mg weekly, Jardiance 25 mg daily, Glimepiride 4 mg daily.  He has been walking some but not consistently. He has decreased meal portions, Ozempic is helping with appetite and he has noticed some weight loss. He has had mild nausea, not frequent. Eye exam: 03/2022.  He has not had an eye exam this year and reports having a cataract in his right eye, which affects his vision. He has been advised to undergo cataract surgery but has not done so due to financial constraints. Foot exam: 12/16/22 Negative for symptoms of hypoglycemia, polyuria, polydipsia, numbness extremities, foot ulcers/trauma  Lab Results  Component Value Date   MICROALBUR 0.8 12/16/2022   He  has a history of drug abuse, including heroin and marijuana use, but has been clean since 1989.  Review of Systems  Constitutional:  Negative for chills and fever.  HENT:  Negative for mouth sores and sore throat.   Respiratory:  Negative for cough and wheezing.   Gastrointestinal:  Negative for abdominal pain and vomiting.  Endocrine: Negative for cold intolerance and heat intolerance.  Genitourinary:  Negative for decreased urine volume, dysuria and hematuria.  Musculoskeletal:  Positive for arthralgias. Negative for joint swelling.  Skin:  Negative for rash.  Neurological:  Negative for syncope and facial asymmetry.  See other pertinent positives and negatives in HPI.  Current Outpatient Medications on File Prior to Visit  Medication Sig Dispense Refill   Accu-Chek FastClix Lancets MISC TEST BLOOD SUGARS TWO TIMES DAILY. 204 each 3   albuterol (VENTOLIN HFA) 108 (90 Base) MCG/ACT inhaler Inhale 2 puffs into the lungs every 6 (six) hours as needed for wheezing or shortness of breath. 8 g 1   Alcohol Swabs (B-D SINGLE USE SWABS REGULAR) PADS 1 Device by Does not apply route daily. 90 each 3   allopurinol (ZYLOPRIM) 300 MG tablet Take 1 tablet (300 mg total) by mouth daily. 90 tablet 2   amLODipine (NORVASC) 10 MG tablet Take 1 tablet (10 mg total) by mouth daily. 90 tablet 2   aspirin 81 MG tablet Take 81 mg by mouth every evening.      diclofenac Sodium (  VOLTAREN) 1 % GEL Apply 2 g topically 4 (four) times daily. 150 g 3   empagliflozin (JARDIANCE) 25 MG TABS tablet Take 1 tablet (25 mg total) by mouth daily before breakfast. 30 tablet 3   glucose blood (ACCU-CHEK GUIDE) test strip TEST BLOOD SUGAR 1 TO 2 TIMES DAILY 200 strip 3   irbesartan (AVAPRO) 75 MG tablet Take 1 tablet (75 mg total) by mouth daily. 90 tablet 3   metoprolol succinate (TOPROL-XL) 50 MG 24 hr tablet TAKE 1 TABLET EVERY DAY WITH A MEAL OR IMMEDIATELY FOLLOWING A MEAL 90 tablet 3   potassium chloride SA (KLOR-CON M)  20 MEQ tablet Take 1 tablet (20 mEq total) by mouth daily. 90 tablet 3   rosuvastatin (CRESTOR) 20 MG tablet Take 1 tablet (20 mg total) by mouth daily. 90 tablet 3   Semaglutide,0.25 or 0.5MG /DOS, 2 MG/3ML SOPN Inject 0.5 mg into the skin once a week.     triamcinolone cream (KENALOG) 0.1 % Apply 2 times daily, small amount on right hand for 2 weeks then 1-2 times per week. 30 g 2   No current facility-administered medications on file prior to visit.   Past Medical History:  Diagnosis Date   Abnormal carotid ultrasound 07/2015   mild bilateral stenosis, repeat in 1 year.  Dr. Rennis Golden   Diabetes mellitus without complication (HCC)    Gout    Hepatitis C antibody test positive 05/2015   prior infection, resolved   History of cardiovascular stress test 9/16   Lexiscan, EF 55%, no ischemia, Dr. Rennis Golden   Hyperlipidemia    Hypertension    Noncompliance    Obesity    PVD (peripheral vascular disease) (HCC) 07/2015   normal ABIs, Dr. Rennis Golden   Allergies  Allergen Reactions   Penicillins Other (See Comments)    Per pt blacked out after injection as a child.  Did it involve swelling of the face/tongue/throat, SOB, or low BP? N Did it involve sudden or severe rash/hives, skin peeling, or any reaction on the inside of your mouth or nose? N Did you need to seek medical attention at a hospital or doctor's office? N When did it last happen?   Several Decades Ago    If all above answers are "NO", may proceed with cephalosporin use.     Social History   Socioeconomic History   Marital status: Divorced    Spouse name: Not on file   Number of children: Not on file   Years of education: Not on file   Highest education level: 10th grade  Occupational History   Not on file  Tobacco Use   Smoking status: Former    Packs/day: 1.00    Years: 30.00    Additional pack years: 0.00    Total pack years: 30.00    Types: Cigarettes    Quit date: 07/31/1995    Years since quitting: 27.8   Smokeless  tobacco: Never  Vaping Use   Vaping Use: Never used  Substance and Sexual Activity   Alcohol use: No    Alcohol/week: 0.0 standard drinks of alcohol   Drug use: No   Sexual activity: Not on file  Other Topics Concern   Not on file  Social History Narrative   Not on file   Social Determinants of Health   Financial Resource Strain: Medium Risk (12/12/2022)   Overall Financial Resource Strain (CARDIA)    Difficulty of Paying Living Expenses: Somewhat hard  Food Insecurity: Food Insecurity Present (12/12/2022)  Hunger Vital Sign    Worried About Running Out of Food in the Last Year: Sometimes true    Ran Out of Food in the Last Year: Sometimes true  Transportation Needs: Unmet Transportation Needs (03/12/2023)   PRAPARE - Transportation    Lack of Transportation (Medical): Yes    Lack of Transportation (Non-Medical): Yes  Physical Activity: Insufficiently Active (12/12/2022)   Exercise Vital Sign    Days of Exercise per Week: 2 days    Minutes of Exercise per Session: 20 min  Stress: Stress Concern Present (12/12/2022)   [employer] of Occupational Health - Occupational Stress Questionnaire    Feeling of Stress : To some extent  Social Connections: Socially Isolated (12/12/2022)   Social Connection and Isolation Panel [NHANES]    Frequency of Communication with Friends and Family: Three times a week    Frequency of Social Gatherings with Friends and Family: Never    Attends Religious Services: Never    Database administrator or Organizations: No    Attends Banker Meetings: Not on file    Marital Status: Divorced    Vitals:   06/09/23 0930  BP: 136/80  Pulse: 98  Resp: 16  Temp: 98.7 F (37.1 C)  SpO2: 97%   Wt Readings from Last 3 Encounters:  06/09/23 249 lb 2 oz (113 kg)  03/03/23 259 lb 2 oz (117.5 kg)  12/16/22 256 lb 2 oz (116.2 kg)    Body mass index is 40.21 kg/m.  Physical Exam Vitals and nursing note reviewed.  Constitutional:       General: He is not in acute distress.    Appearance: He is well-developed.  HENT:     Head: Normocephalic and atraumatic.  Eyes:     Conjunctiva/sclera: Conjunctivae normal.  Cardiovascular:     Rate and Rhythm: Normal rate and regular rhythm.     Pulses:          Dorsalis pedis pulses are 2+ on the right side and 2+ on the left side.     Heart sounds: No murmur heard. Pulmonary:     Effort: Pulmonary effort is normal. No respiratory distress.     Breath sounds: Normal breath sounds.  Abdominal:     Palpations: Abdomen is soft. There is no hepatomegaly or mass.     Tenderness: There is no abdominal tenderness.  Musculoskeletal:     Right shoulder: Tenderness present. Decreased range of motion.  Lymphadenopathy:     Cervical: No cervical adenopathy.  Skin:    General: Skin is warm.     Findings: No erythema or rash.  Neurological:     Mental Status: He is alert and oriented to person, place, and time.     Cranial Nerves: No cranial nerve deficit.     Gait: Gait normal.  Psychiatric:        Mood and Affect: Mood and affect normal.   ASSESSMENT AND PLAN:  Charles [name] was seen today for medical management of chronic issues.  Diagnoses and all orders for this visit: Lab Results  Component Value Date   CREATININE 1.41 06/09/2023   BUN 14 06/09/2023   NA 138 06/09/2023   K 3.9 06/09/2023   CL 105 06/09/2023   CO2 23 06/09/2023   Lab Results  Component Value Date   HGBA1C 6.5 06/09/2023   Type 2 diabetes mellitus with vascular disease (HCC) Assessment & Plan: Hemoglobin A1c is now at goal, it went from 8.0 in 11/2022 to  6.5 today. He prefers not to increase the dose of Ozempic for now due to mild nausea, so continue 0.5 mg weekly. Continue Jardiance 25 mg daily. Because he is having some BS < 80, recommend decreasing dose of glimepiride from 4 mg to 2 mg daily. Continue monitoring BS regularly. Recommend increasing physical activity and continue following dietary  recommendations. We discussed the importance of periodic eye exams.  Also important appropriate dental and footcare. Follow-up in 3 to 4 months.  Orders: -     POCT glycosylated hemoglobin (Hb A1C) -     Glimepiride; Take 1 tablet (2 mg total) by mouth daily before breakfast.  Dispense: 90 tablet; Refill: 0  Essential hypertension Assessment & Plan: BP adequately controlled. Continue amlodipine 10 mg daily, irbesartan 75 mg daily, and metoprolol succinate 50 mg daily. He does not have a BP monitor, he cannot afford it. Continue low-salt diet. He is due for eye exam.  Orders: -     Basic metabolic panel; Future  Hyperlipidemia, unspecified hyperlipidemia type Assessment & Plan: Last LDL 57 in 08/2022. Continue rosuvastatin 20 mg daily and low-fat diet. We will plan on fasting lipid panel at next visit.  Chronic right shoulder pain ? Rotator cuff pathology. He does not want a subacromial steroid injection today. Referral to sports medicine placed today. He would like to have a shoulder x-ray today.  -     DG Shoulder Right; Future -     Ambulatory referral to Sports Medicine  Drug dependence, in remission [facility]) Assessment & Plan: He has not had any illicit drug or alcohol in 40+ years.  Morbid obesity (HCC) Assessment & Plan: He lost about 10 pounds since 02/2023. Consistency with healthy diet and physical activity encouraged.  I spent a total of 41 minutes in both face to face and non face to face activities for this visit on the date of this encounter. During this time history was obtained and documented, examination was performed, prior labs reviewed, and assessment/plan discussed.  Return in about 17 weeks (around 10/06/2023) for chronic problems.  Hertha Gergen G. [place], MD  [facility] [place] office.

## 2023-06-08 NOTE — Telephone Encounter (Signed)
   Telephone encounter was:  Successful.  06/08/2023 Name: Charles [name] MRN: [id number] DOB: 04-12-1951  [name] is a 72 y.o. year old male who is a primary care patient of [place], Timoteo Expose, MD . The community resource team was consulted for assistance with Transportation Needs  Patient calling to confirm times for [place] transportation tomorrow Care guide performed the following interventions: Patient provided with information about care guide support team and interviewed to confirm resource needs.  Follow Up Plan:  No further follow up planned at this time. The patient has been provided with needed resources. Yehuda Mao Greenauer -[facility] [place], Population Health [phone] [address] [place] , [place] [address] Email : [email or web address]

## 2023-06-09 ENCOUNTER — Ambulatory Visit (INDEPENDENT_AMBULATORY_CARE_PROVIDER_SITE_OTHER): Payer: Medicare PPO

## 2023-06-09 ENCOUNTER — Encounter: Payer: Self-pay | Admitting: Family Medicine

## 2023-06-09 ENCOUNTER — Ambulatory Visit (INDEPENDENT_AMBULATORY_CARE_PROVIDER_SITE_OTHER): Payer: Medicare PPO | Admitting: Family Medicine

## 2023-06-09 VITALS — BP 136/80 | HR 98 | Temp 98.7°F | Resp 16 | Ht 66.0 in | Wt 249.1 lb

## 2023-06-09 DIAGNOSIS — I10 Essential (primary) hypertension: Secondary | ICD-10-CM | POA: Diagnosis not present

## 2023-06-09 DIAGNOSIS — F1921 Other psychoactive substance dependence, in remission: Secondary | ICD-10-CM

## 2023-06-09 DIAGNOSIS — G8929 Other chronic pain: Secondary | ICD-10-CM

## 2023-06-09 DIAGNOSIS — M25511 Pain in right shoulder: Secondary | ICD-10-CM | POA: Diagnosis not present

## 2023-06-09 DIAGNOSIS — E785 Hyperlipidemia, unspecified: Secondary | ICD-10-CM | POA: Diagnosis not present

## 2023-06-09 DIAGNOSIS — M19011 Primary osteoarthritis, right shoulder: Secondary | ICD-10-CM | POA: Diagnosis not present

## 2023-06-09 DIAGNOSIS — E1159 Type 2 diabetes mellitus with other circulatory complications: Secondary | ICD-10-CM | POA: Diagnosis not present

## 2023-06-09 DIAGNOSIS — Z7984 Long term (current) use of oral hypoglycemic drugs: Secondary | ICD-10-CM

## 2023-06-09 LAB — BASIC METABOLIC PANEL
BUN: 14 mg/dL (ref 6–23)
CO2: 23 mEq/L (ref 19–32)
Calcium: 9.8 mg/dL (ref 8.4–10.5)
Chloride: 105 mEq/L (ref 96–112)
Creatinine, Ser: 1.41 mg/dL (ref 0.40–1.50)
GFR: 50.16 mL/min — ABNORMAL LOW (ref >60.00)
Glucose, Bld: 84 mg/dL (ref 70–99)
Potassium: 3.9 mEq/L (ref 3.5–5.1)
Sodium: 138 mEq/L (ref 135–145)

## 2023-06-09 LAB — POCT GLYCOSYLATED HEMOGLOBIN (HGB A1C): HbA1c, POC (controlled diabetic range): 6.5 % (ref 0.0–7.0)

## 2023-06-09 MED ORDER — ACCU-CHEK GUIDE VI STRP: ORAL_STRIP | 3 refills | Status: DC

## 2023-06-09 MED ORDER — ACCU-CHEK FASTCLIX LANCETS MISC: 3 refills | Status: DC

## 2023-06-09 MED ORDER — GLIMEPIRIDE 2 MG PO TABS
2.0000 mg | ORAL_TABLET | Freq: Every day | ORAL | 0 refills | Status: DC
Start: 2023-06-09 — End: 2023-08-13

## 2023-06-09 NOTE — Assessment & Plan Note (Signed)
He has not had any illicit drug or alcohol in 40+ years.

## 2023-06-09 NOTE — Assessment & Plan Note (Signed)
Last LDL 57 in 08/2022. Continue rosuvastatin 20 mg daily and low-fat diet. We will plan on fasting lipid panel at next visit.

## 2023-06-09 NOTE — Patient Instructions (Addendum)
A few things to remember from today's visit:  Type 2 diabetes mellitus with vascular disease (HCC) - Plan: POC HgB A1c, glimepiride (AMARYL) 2 MG tablet  Essential hypertension - Plan: Basic metabolic panel  Hyperlipidemia, unspecified hyperlipidemia type  Chronic right shoulder pain - Plan: DG Shoulder Right, Ambulatory referral to Sports Medicine Decreased Glimepiride from 4 mg to 2 mg daily. No changes in rest of medications.  If you need refills for medications you take chronically, please call your pharmacy. Do not use My Chart to request refills or for acute issues that need immediate attention. If you send a my chart message, it may take a few days to be addressed, specially if I am not in the office.  Please be sure medication list is accurate. If a new problem present, please set up appointment sooner than planned today.

## 2023-06-09 NOTE — Assessment & Plan Note (Addendum)
Hemoglobin A1c is now at goal, it went from 8.0 in 11/2022 to 6.5 today. He prefers not to increase the dose of Ozempic for now due to mild nausea, so continue 0.5 mg weekly. Continue Jardiance 25 mg daily. Because he is having some BS < 80, recommend decreasing dose of glimepiride from 4 mg to 2 mg daily. Continue monitoring BS regularly. Recommend increasing physical activity and continue following dietary recommendations. We discussed the importance of periodic eye exams.  Also important appropriate dental and footcare. Follow-up in 3 to 4 months.

## 2023-06-09 NOTE — Assessment & Plan Note (Signed)
He lost about 10 pounds since 02/2023. Consistency with healthy diet and physical activity encouraged.

## 2023-06-09 NOTE — Assessment & Plan Note (Signed)
BP adequately controlled. Continue amlodipine 10 mg daily, irbesartan 75 mg daily, and metoprolol succinate 50 mg daily. He does not have a BP monitor, he cannot afford it. Continue low-salt diet. He is due for eye exam.

## 2023-06-15 ENCOUNTER — Ambulatory Visit: Payer: Self-pay

## 2023-06-15 NOTE — Patient Outreach (Signed)
  Care Coordination   Follow Up Visit Note   06/15/2023 Name: Charles [name] MRN: [id number] DOB: 1951/02/18  Charles [name] is a 72 y.o. year old male who sees Charles [name], Charles Expose, MD for primary care. I spoke with  Charles [name] by phone today.  What matters to the patients health and wellness today?  Transportation resources    Goals Addressed             This Visit's Progress    Care Coordination Activities       Care Coordination Interventions: Provided verbal and printed education on the patients health plan transportation benefit Discussed plans for SW to follow up over the next two weeks to review mailed resource        SDOH assessments and interventions completed:  No     Care Coordination Interventions:  Yes, provided   Interventions Today    Flowsheet Row Most Recent Value  Chronic Disease   Chronic disease during today's visit Diabetes, Hypertension (HTN)  General Interventions   General Interventions Discussed/Reviewed General Interventions Reviewed  Education Interventions   Education Provided Provided Education  Provided Verbal Education On Insurance Plans        Follow up plan: Follow up call scheduled for 7/9    Encounter Outcome:  Pt. Visit Completed   Charles [name], Charles [name], CDP Social Worker, Certified Dementia Practitioner [facility] Care Management  Care Coordination [phone]

## 2023-06-15 NOTE — Patient Instructions (Signed)
Visit Information  Thank you for taking time to visit with me today. Please don't hesitate to contact me if I can be of assistance to you.   Following are the goals we discussed today:  - Review mailed resource information   Our next appointment is by telephone on 7/9 at 1:30 pm  Please call the care guide team at [phone] if you need to cancel or reschedule your appointment.   If you are experiencing a Mental Health or Behavioral Health Crisis or need someone to talk to, please call 1-800-273-TALK (toll free, 24 hour hotline) go to [facility] [address] [place] [phone]) call 911  Patient verbalizes understanding of instructions and care plan provided today and agrees to view in MyChart. Active MyChart status and patient understanding of how to access instructions and care plan via MyChart confirmed with patient.     Bevelyn Ngo, BSW, CDP Social Worker, Certified Dementia Practitioner [facility] Care Management  Care Coordination [phone]

## 2023-06-29 ENCOUNTER — Ambulatory Visit: Payer: Self-pay

## 2023-06-29 NOTE — Patient Outreach (Signed)
  Care Coordination   Follow Up Visit Note   06/29/2023 Name: Charles [name] MRN: [id number] DOB: 08/08/1951  Charles [name] is a 72 y.o. year old male who sees Charles [name], Charles Expose, MD for primary care. I spoke with  Charles [name] by phone today.  What matters to the patients health and wellness today?  Patient would like to schedule an appointment with his cardiologist.    Goals Addressed             This Visit's Progress    COMPLETED: Care Coordination Activities       Care Coordination Interventions: Confirmed receipt of mailed resource information - patient states he understands how to arrange transportation via [facility] benefit when needed Discussed the patient has been having occasional chest pain which he feels is due to gas Advised the patient to contact Dr. Blanchie Dessert office to schedule an appointment - patient stated understanding Assessed for patient interest in engagement with RN Care Manager; patient agreeable and visit scheduled Collaboration with RN Care Manager Dionne Leath to advise of interventions and plan for the patient to contact Dr. Rennis Golden for an appointment to address concern with intermittent chest pain        SDOH assessments and interventions completed:  No     Care Coordination Interventions:  Yes, provided   Interventions Today    Flowsheet Row Most Recent Value  Chronic Disease   Chronic disease during today's visit Diabetes, Hypertension (HTN)  General Interventions   General Interventions Discussed/Reviewed General Interventions Reviewed, Referral to Nurse, Doctor Visits  Doctor Visits Discussed/Reviewed --  [Instructed the patient to contact his cardiologist for an appointment]       Follow up plan: Follow up call scheduled for 7/18 with RN Care Manager Dionne Leath.    Encounter Outcome:  Pt. Visit Completed   Bevelyn Ngo, BSW, CDP Social Worker, Certified Dementia Practitioner [facility] Care Management  Care Coordination [phone]

## 2023-06-29 NOTE — Patient Instructions (Signed)
Visit Information  Thank you for taking time to visit with me today. Please don't hesitate to contact me if I can be of assistance to you.   Following are the goals we discussed today:  - Contact Dr. Blanchie Dessert office to schedule an appointment   Your next appointment is by telephone on 7/18 at 10:00  Please call the care guide team at [phone] if you need to cancel or reschedule your appointment.   If you are experiencing a Mental Health or Behavioral Health Crisis or need someone to talk to, please call 1-800-273-TALK (toll free, 24 hour hotline) go to [facility] [address] [place] [phone]) call 911  Patient verbalizes understanding of instructions and care plan provided today and agrees to view in MyChart. Active MyChart status and patient understanding of how to access instructions and care plan via MyChart confirmed with patient.     No further follow up required: Please contact me as needed.  Bevelyn Ngo, BSW, CDP Social Worker, Certified Dementia Practitioner [facility] Care Management  Care Coordination [phone]

## 2023-07-05 ENCOUNTER — Other Ambulatory Visit: Payer: Medicare PPO

## 2023-07-05 NOTE — Progress Notes (Unsigned)
   07/05/2023  Patient ID: Charles [name], male   DOB: [birth year], 72 y.o.   MRN: [id number]  S/O Telephone follow-up visit to check on tolerance of Ozempic and DM control  Diabetes Management Plan -Current medications:  Jardiance 25mg  daily, glimepiride 2mg  daily before breakfast, Ozempic 0.5mg  weekly -Recent FBG values:  101, 88, 70, 118 -Patient does not endorse any s/sx of hypo/hyperglycemia -Patient is interested in when he will received next Ozempic refill from [facility] PAP -Discussed possibility of increasing Ozempic to 1mg  weekly to further decrease (or even stop) glimepiride; but patient is not interested at this time -Current PAP:  Ozempic 0.5mg  through [facility] and Jardiance 25mg  through [facility]  A/P  Diabetes Management Plan -Currently controlled -Continue current regimen at this time -I will contact Ozempic PAP to verify when patient can expect his next refill and notify him via MyChart message -Will also make sure patient is on PAP spreadsheet, so medication assistance team can work on application renewals for Jardiance and Ozempic when due  Follow-up:  Telephone follow-up in 2 months to check on DM and HTN control.  I am also forwarding chart to [facility] [place], LCSW, that has been working with patient on transportation needs; because patient is endorsing inability to pay for needed cataract sugery  [name], PharmD, DPLA

## 2023-07-07 ENCOUNTER — Telehealth: Payer: Self-pay

## 2023-07-07 NOTE — Patient Outreach (Signed)
  Care Coordination   Initial Visit Note   07/07/2023 Name: Charles [name] MRN: [id number] DOB: [birth year]  Charles [name] is a 72 y.o. year old male who sees [place], Timoteo Expose, MD for primary care. I spoke with  Charles [name] by phone today.  What matters to the patients health and wellness today?  Patient needs help making copayment for cataract surgery.    Goals Addressed             This Visit's Progress    Coordinated Care Activities       Care Coordination Interventions: Patient was informed at his last eye exam that he needed to have cataract removed.  The cost is $350 upfront deductible.  Patient was informed by the doctor that there is not a payment plan.  Patient planned to save for the surgery but other things required the use of his additional funds.  Patient did not attend his April appointment and has agreed to schedule for his eye exam.  Patient will speak to someone is billing to see if there are any other assistance or options for the copayment.           SDOH assessments and interventions completed:  No     Care Coordination Interventions:  Yes, provided   Interventions Today    Flowsheet Row Most Recent Value  General Interventions   General Interventions Discussed/Reviewed General Interventions Discussed  [SW does not have resources to make copayment.  Suggest patient speak to billing staff for options or payment arrangements.]        Follow up plan: No further intervention required.   Encounter Outcome:  Pt. Visit Completed

## 2023-07-07 NOTE — Patient Instructions (Signed)
Visit Information  Thank you for taking time to visit with me today. Please don't hesitate to contact me if I can be of assistance to you.   Following are the goals we discussed today:   Goals Addressed             This Visit's Progress    Coordinated Care Activities       Care Coordination Interventions: Patient was informed at his last eye exam that he needed to have cataract removed.  The cost is $350 upfront deductible.  Patient was informed by the doctor that there is not a payment plan.  Patient planned to save for the surgery but other things required the use of his additional funds.  Patient did not attend his April appointment and has agreed to schedule for his eye exam.  Patient will speak to someone is billing to see if there are any other assistance or options for the copayment.           If you are experiencing a Mental Health or Behavioral Health Crisis or need someone to talk to, please call 911  Patient verbalizes understanding of instructions and care plan provided today and agrees to view in MyChart. Active MyChart status and patient understanding of how to access instructions and care plan via MyChart confirmed with patient.     No further follow up required:    Lysle Morales, BSW Social Worker [facility] Care Management  [phone]

## 2023-07-07 NOTE — Progress Notes (Signed)
   07/07/2023  Patient ID: Charles [name], male   DOB: 1951-04-04, 72 y.o.   MRN: [id number]  Contacted [employer] and Ozempic PAP is approved through 12/31. Company states the order is shipping today and should arrive at Dr. Elvis Coil office tomorrow.  He is also enrolled in auto refill, so orders do not need to be requested unless his dose is changed.  Will verify this is received by PCP office and contact patient to make him aware if he has not been already.  [name], PharmD, DPLA

## 2023-07-08 ENCOUNTER — Ambulatory Visit: Payer: Self-pay

## 2023-07-08 NOTE — Patient Instructions (Signed)
Visit Information  Thank you for taking time to visit with me today. Please don't hesitate to contact me if I can be of assistance to you.   Following are the goals we discussed today:   Goals Addressed             This Visit's Progress    Health Management       Patient Goals/Self Care Activities: -Patient/Caregiver will self-administer medications as prescribed as evidenced by self-report/primary caregiver report  -Patient/Caregiver will attend all scheduled provider appointments as evidenced by clinician review of documented attendance to scheduled appointments and patient/caregiver report -Patient/Caregiver will call provider office for new concerns or questions as evidenced by review of documented incoming telephone call notes and patient report    Patient doing okay.  A1c better at 6.5. Patient reports chest pain he thinks is gas.  He did not follow up with cardiologist.  Patient has cataract to right eye. Needing to go to an eye doctor.  Advised [facility].  Will send information.         Our next appointment is by telephone on 08/12/23 at 1000 am  Please call the care guide team at [phone] if you need to cancel or reschedule your appointment.   If you are experiencing a Mental Health or Behavioral Health Crisis or need someone to talk to, please call the Suicide and Crisis Lifeline: 988   Patient verbalizes understanding of instructions and care plan provided today and agrees to view in MyChart. Active MyChart status and patient understanding of how to access instructions and care plan via MyChart confirmed with patient.     The patient has been provided with contact information for the care management team and has been advised to call with any health related questions or concerns.   [name], RN, MSN [facility] Care Management Care Management Coordinator Direct Line [phone]

## 2023-07-08 NOTE — Patient Outreach (Signed)
  Care Coordination   Initial Visit Note   07/08/2023 Name: QUASEAN FRYE MRN: [id number] DOB: 04/12/1951  WILMAN TUCKER is a 72 y.o. year old male who sees [place], Timoteo Expose, MD for primary care. I spoke with  [name] by phone today.  What matters to the patients health and wellness today?  Health maintenance    Goals Addressed             This Visit's Progress    Health Management       Patient Goals/Self Care Activities: -Patient/Caregiver will self-administer medications as prescribed as evidenced by self-report/primary caregiver report  -Patient/Caregiver will attend all scheduled provider appointments as evidenced by clinician review of documented attendance to scheduled appointments and patient/caregiver report -Patient/Caregiver will call provider office for new concerns or questions as evidenced by review of documented incoming telephone call notes and patient report    Patient doing okay.  A1c better at 6.5. Patient reports chest pain he thinks is gas.  He did not follow up with cardiologist.  Patient has cataract to right eye. Needing to go to an eye doctor.  Advised [facility].  Will send information.         SDOH assessments and interventions completed:  Yes  SDOH Interventions Today    Flowsheet Row Most Recent Value  SDOH Interventions   Housing Interventions Intervention Not Indicated        Care Coordination Interventions:  Yes, provided   Follow up plan: Follow up call scheduled for August    Encounter Outcome:  Pt. Visit Completed   [name], RN, MSN [facility] Care Management Care Management Coordinator Direct Line [phone]

## 2023-08-05 ENCOUNTER — Encounter: Payer: Self-pay | Admitting: Family Medicine

## 2023-08-05 ENCOUNTER — Telehealth (INDEPENDENT_AMBULATORY_CARE_PROVIDER_SITE_OTHER): Payer: Medicare PPO | Admitting: Family Medicine

## 2023-08-05 VITALS — Ht 65.5 in | Wt 243.0 lb

## 2023-08-05 DIAGNOSIS — I10 Essential (primary) hypertension: Secondary | ICD-10-CM | POA: Diagnosis not present

## 2023-08-05 DIAGNOSIS — Z0000 Encounter for general adult medical examination without abnormal findings: Secondary | ICD-10-CM | POA: Diagnosis not present

## 2023-08-05 DIAGNOSIS — E1159 Type 2 diabetes mellitus with other circulatory complications: Secondary | ICD-10-CM

## 2023-08-05 DIAGNOSIS — E785 Hyperlipidemia, unspecified: Secondary | ICD-10-CM | POA: Diagnosis not present

## 2023-08-05 NOTE — Patient Instructions (Signed)
I really enjoyed getting to talk with you today! I am available on Tuesdays and Thursdays for virtual visits if you have any questions or concerns, or if I can be of any further assistance.   CHECKLIST FROM ANNUAL WELLNESS VISIT:  -Follow up (please call to schedule if not scheduled after visit):   -yearly for annual wellness visit with primary care office  Here is a list of your preventive care/health maintenance measures and the plan for each if any are due:  PLAN For any measures below that may be due:   Health Maintenance  Topic Date Due   OPHTHALMOLOGY EXAM  03/27/2023   COVID-19 Vaccine (10 - 2023-24 season) 08/21/2023 (Originally 06/09/2023)   Zoster Vaccines- Shingrix (2 of 2) 12/24/2023 (Originally 11/10/2022)   INFLUENZA VACCINE  03/20/2024 (Originally 07/22/2023)   HEMOGLOBIN A1C  12/09/2023   Diabetic kidney evaluation - Urine ACR  12/17/2023   FOOT EXAM  12/17/2023   Diabetic kidney evaluation - eGFR measurement  06/08/2024   Medicare Annual Wellness (AWV)  08/04/2024   Colonoscopy  07/30/2025   Pneumonia Vaccine 91+ Years old  Completed   Hepatitis C Screening  Completed   HPV VACCINES  Aged Out   DTaP/Tdap/Td  Discontinued   Fecal DNA (Cologuard)  Discontinued    -See a dentist at least yearly  -Get your eyes checked and then per your eye specialist's recommendations  -Other issues addressed today:   -I have included below further information regarding a healthy whole foods based diet, physical activity guidelines for adults, stress management and opportunities for social connections. I hope you find this information useful.   -----------------------------------------------------------------------------------------------------------------------------------------------------------------------------------------------------------------------------------------------------------  NUTRITION: -eat real food: lots of colorful vegetables (half the plate) and fruits -5-7  servings of vegetables and fruits per day (fresh or steamed is best), exp. 2 servings of vegetables with lunch and dinner and 2 servings of fruit per day. Berries and greens such as kale and collards are great choices.  -consume on a regular basis: whole grains (make sure first ingredient on label contains the word "whole"), fresh fruits, fish, nuts, seeds, healthy oils (such as olive oil, avocado oil, grape seed oil) -may eat small amounts of dairy and lean meat on occasion, but avoid processed meats such as ham, bacon, lunch meat, etc. -drink water -try to avoid fast food and pre-packaged foods, processed meat -most experts advise limiting sodium to < 2300mg  per day, should limit further is any chronic conditions such as high blood pressure, heart disease, diabetes, etc. The American Heart Association advised that < 1500mg  is is ideal -try to avoid foods that contain any ingredients with names you do not recognize  -try to avoid sugar/sweets (except for the natural sugar that occurs in fresh fruit) -try to avoid sweet drinks -try to avoid white rice, white bread, pasta (unless whole grain), white or yellow potatoes  EXERCISE GUIDELINES FOR ADULTS: -if you wish to increase your physical activity, do so gradually and with the approval of your doctor -STOP and seek medical care immediately if you have any chest pain, chest discomfort or trouble breathing when starting or increasing exercise  -move and stretch your body, legs, feet and arms when sitting for long periods -Physical activity guidelines for optimal health in adults: -least 150 minutes per week of aerobic exercise (can talk, but not sing) once approved by your doctor, 20-30 minutes of sustained activity or two 10 minute episodes of sustained activity every day.  -resistance training at least 2 days per  week if approved by your doctor -balance exercises 3+ days per week:   Stand somewhere where you have something sturdy to hold onto if  you lose balance.    1) lift up on toes, start with 5x per day and work up to 20x   2) stand and lift on leg straight out to the side so that foot is a few inches of the floor, start with 5x each side and work up to 20x each side   3) stand on one foot, start with 5 seconds each side and work up to 20 seconds on each side  If you need ideas or help with getting more active:  -Silver sneakers https://tools.silversneakers.com  -Walk with a Doc: http://www.duncan-williams.com/  -try to include resistance (weight lifting/strength building) and balance exercises twice per week: or the following link for ideas: http://castillo-powell.com/  BuyDucts.dk  STRESS MANAGEMENT: -can try meditating, or just sitting quietly with deep breathing while intentionally relaxing all parts of your body for 5 minutes daily -if you need further help with stress, anxiety or depression please follow up with your primary doctor or contact the wonderful folks at [facility]: [phone]  SOCIAL CONNECTIONS: -options in [place] if you wish to engage in more social and exercise related activities:  -Silver sneakers https://tools.silversneakers.com  -Walk with a Doc: http://www.duncan-williams.com/  -Check out the [facility] Active Adults 50+ section on the [place] of [employer] (hiking clubs, book clubs, cards and games, chess, exercise classes, aquatic classes and much more) - see the website for details: https://www.[employer].gov/departments/parks-recreation/active-adults50  -YouTube has lots of exercise videos for different ages and abilities as well  -[facility] (a variety of indoor and outdoor inperson activities for adults). [phone]. [address]  -Virtual Online Classes (a variety of topics): see seniorplanet.org or call [phone]  -consider volunteering at a school,  hospice center, church, senior center or elsewhere   ADVANCED HEALTHCARE DIRECTIVES:  Everyone should have advanced health care directives in place. This is so that you get the care you want, should you ever be in a situation where you are unable to make your own medical decisions.   From the [place] Advanced Directive Website: "Advance Health Care Directives are legal documents in which you give written instructions about your health care if, in the future, you cannot speak for yourself.   A health care power of attorney allows you to name a person you trust to make your health care decisions if you cannot make them yourself. A declaration of a desire for a natural death (or living will) is document, which states that you desire not to have your life prolonged by extraordinary measures if you have a terminal or incurable illness or if you are in a vegetative state. An advance instruction for mental health treatment makes a declaration of instructions, information and preferences regarding your mental health treatment. It also states that you are aware that the advance instruction authorizes a mental health treatment provider to act according to your wishes. It may also outline your consent or refusal of mental health treatment. A declaration of an anatomical gift allows anyone over the age of 65 to make a gift by will, organ donor card or other document."   Please see the following website or an elder law attorney for forms, FAQs and for completion of advanced directives: [place] Health Care Directives Advance Health Care Directives (http://guzman.com/)  Or copy and paste the following to your web browser: PoshChat.fi

## 2023-08-05 NOTE — Progress Notes (Signed)
"  Patient was unable to self-report due to a lack of equipment at home via telehealth"

## 2023-08-05 NOTE — Progress Notes (Signed)
PATIENT CHECK-IN and HEALTH RISK ASSESSMENT QUESTIONNAIRE:  -completed by phone/video for upcoming Medicare Preventive Visit  Pre-Visit Check-in: 1)Vitals (height, wt, BP, etc) - record in vitals section for visit on day of visit Request home vitals (wt, BP, etc.) and enter into vitals, THEN update Vital Signs SmartPhrase below at the top of the HPI. See below.  2)Review and Update Medications, Allergies PMH, Surgeries, Social history in Epic 3)Hospitalizations in the last year with date/reason? No 4)Review and Update Care Team (patient's specialists) in Epic 5) Complete PHQ9 in Epic  6) Complete Fall Screening in Epic 7)Review all Health Maintenance Due and order under PCP if not done.  Medicare Wellness Patient Questionnaire:  Answer theses question about your habits: Do you drink alcohol? no If yes, how many drinks do you have a day?na Have you ever smoked?yes  Quit date if applicable? 1996  How many packs a day do/did you smoke? 1 pack a day Do you use smokeless tobacco? no Do you use an illicit drugs? no Do you exercises? Not really IF so, what type and how many days/minutes per week?na Are you sexually active? No Number of partners? Typical breakfast: [place] sausage, eggs, toast ,coffee Typical lunch: skip lunch Typical dinner: Doesn't eat red meat - fish, [place], tv dinner, potatoes, rice Typical snacks:rarely when sugar gets low - hard candy, cupcake, orange juice  Beverages: coffee, diet juice, ginger ale (diet), water  Answer theses question about you: Can you perform most household chores?yes. Has a hard time cleaning the tub. Do you find it hard to follow a conversation in a noisy room? yes Do you often ask people to speak up or repeat themselves?yes Do you feel that you have a problem with memory? sometimes Do you balance your checkbook and or bank acounts?yes Do you feel safe at home? yes Last dentist visit? A few years - only have 2 teeth Do you need assistance  with any of the following: Please note if so yes  Driving? Does not have a vehicle  Feeding yourself?no  Getting from bed to chair?no  Getting to the toilet?no  Bathing or showering?yes - can't get to his back because or pain with his right shoulder  Dressing yourself?no  Managing money?no  Climbing a flight of stairs - SOB with a lot of stairs  Preparing meals?no    Do you have Advanced Directives in place (Living Will, Healthcare Power or Attorney)? no   Last eye Exam and location?5/24 [place] - has cataract in R eye.    Do you currently use prescribed or non-prescribed narcotic or opioid pain medications? no  Do you have a history or close family history of breast, ovarian, tubal or peritoneal cancer or a family member with BRCA (breast cancer susceptibility 1 and 2) gene mutations? Sister has breast cancer  Request home vitals (wt, BP, etc.) and enter into vitals, THEN update Vital Signs SmartPhrase below at the top of the HPI. See below.   Nurse/Assistant Credentials/time stamp:  Kern Reap, CMA ----------------------------------------------------------------------------------------------------------------------------------------------------------------------------------------------------------------------  Vital Signs: Vital signs are patient reported.   MEDICARE ANNUAL PREVENTIVE CARE VISIT WITH PROVIDER (Welcome to Medicare, initial annual wellness or annual wellness exam)  Virtual Visit via Video Note  I connected with [name] on 08/05/23  by a video enabled telemedicine application and verified that I am speaking with the correct person using two identifiers.  Location patient: home Location provider:work or home office Persons participating in the virtual visit: patient, provider  Concerns and/or follow  up today: reports is doing well. Struggles with transportation for doctor appointments. Does not have a vehicle.  Nurse assistant placed referral for  transportation.    See HM section in Epic for other details of completed HM.    ROS: negative for report of fevers, unintentional weight loss, vision changes (none new, has cataract - see eye doctor), vision loss, hearing loss or change, chest pain, sob, hemoptysis, melena, hematochezia, hematuria, falls, bleeding or bruising, thoughts of suicide or self harm, memory loss  Patient-completed extensive health risk assessment - reviewed and discussed with the patient: See Health Risk Assessment completed with patient prior to the visit either above or in recent phone note. This was reviewed in detailed with the patient today and appropriate recommendations, orders and referrals were placed as needed per Summary below and patient instructions.   Review of Medical History: -PMH, PSH, Family History and current specialty and care providers reviewed and updated and listed below   Patient Care Team: [place], [name], MD as PCP - General (Family Medicine) Rennis Golden Lisette Abu, MD as PCP - Cardiology (Cardiology) Rennis Golden Lisette Abu, MD as Consulting Physician (Cardiology) Crosby, Milas Kocher, RPH (Inactive) (Pharmacist) Fleeta Emmer, RN as [facility] Care Management   Past Medical History:  Diagnosis Date   Abnormal carotid ultrasound 07/2015   mild bilateral stenosis, repeat in 1 year.  Dr. Rennis Golden   Diabetes mellitus without complication (HCC)    Gout    Hepatitis C antibody test positive 05/2015   prior infection, resolved   History of cardiovascular stress test 9/16   Lexiscan, EF 55%, no ischemia, Dr. Rennis Golden   Hyperlipidemia    Hypertension    Noncompliance    Obesity    PVD (peripheral vascular disease) (HCC) 07/2015   normal ABIs, Dr. Rennis Golden    Past Surgical History:  Procedure Laterality Date   BALLOON ANGIOPLASTY, ARTERY N/A also placed stent   COLONOSCOPY  2016   Dr. Elnoria Howard    Social History   Socioeconomic History   Marital status: Divorced    Spouse name: Not on file    Number of children: Not on file   Years of education: Not on file   Highest education level: 10th grade  Occupational History   Not on file  Tobacco Use   Smoking status: Former    Current packs/day: 0.00    Average packs/day: 1 pack/day for 30.0 years (30.0 ttl pk-yrs)    Types: Cigarettes    Start date: 07/30/1965    Quit date: 07/31/1995    Years since quitting: 28.0   Smokeless tobacco: Never  Vaping Use   Vaping status: Never Used  Substance and Sexual Activity   Alcohol use: No    Alcohol/week: 0.0 standard drinks of alcohol   Drug use: No   Sexual activity: Not on file  Other Topics Concern   Not on file  Social History Narrative   Not on file   Social Determinants of Health   Financial Resource Strain: Medium Risk (12/12/2022)   Overall Financial Resource Strain (CARDIA)    Difficulty of Paying Living Expenses: Somewhat hard  Food Insecurity: Food Insecurity Present (12/12/2022)   Hunger Vital Sign    Worried About Running Out of Food in the Last Year: Sometimes true    Ran Out of Food in the Last Year: Sometimes true  Transportation Needs: Unmet Transportation Needs (03/12/2023)   PRAPARE - Transportation    Lack of Transportation (Medical): Yes    Lack  of Transportation (Non-Medical): Yes  Physical Activity: Insufficiently Active (12/12/2022)   Exercise Vital Sign    Days of Exercise per Week: 2 days    Minutes of Exercise per Session: 20 min  Stress: Stress Concern Present (12/12/2022)   [employer] of Occupational Health - Occupational Stress Questionnaire    Feeling of Stress : To some extent  Social Connections: Socially Isolated (12/12/2022)   Social Connection and Isolation Panel [NHANES]    Frequency of Communication with Friends and Family: Three times a week    Frequency of Social Gatherings with Friends and Family: Never    Attends Religious Services: Never    Database administrator or Organizations: No    Attends Hospital doctor: Not on file    Marital Status: Divorced  Intimate Partner Violence: Not At Risk (08/20/2021)   Humiliation, Afraid, Rape, and Kick questionnaire    Fear of Current or Ex-Partner: No    Emotionally Abused: No    Physically Abused: No    Sexually Abused: No    Family History  Problem Relation Age of Onset   Asthma Mother    Heart attack Mother    Hypertension Mother    Hyperlipidemia Mother    Hypertension Father    Diabetes Father    HIV Sister    Liver disease Brother    Lung cancer Brother    Hypertension Brother     Current Outpatient Medications on File Prior to Visit  Medication Sig Dispense Refill   Accu-Chek FastClix Lancets MISC TEST BLOOD SUGARS TWO TIMES DAILY. 204 each 3   albuterol (VENTOLIN HFA) 108 (90 Base) MCG/ACT inhaler Inhale 2 puffs into the lungs every 6 (six) hours as needed for wheezing or shortness of breath. 8 g 1   Alcohol Swabs (B-D SINGLE USE SWABS REGULAR) PADS 1 Device by Does not apply route daily. 90 each 3   allopurinol (ZYLOPRIM) 300 MG tablet Take 1 tablet (300 mg total) by mouth daily. 90 tablet 2   amLODipine (NORVASC) 10 MG tablet Take 1 tablet (10 mg total) by mouth daily. 90 tablet 2   aspirin 81 MG tablet Take 81 mg by mouth every evening.      empagliflozin (JARDIANCE) 25 MG TABS tablet Take 1 tablet (25 mg total) by mouth daily before breakfast. 30 tablet 3   glimepiride (AMARYL) 2 MG tablet Take 1 tablet (2 mg total) by mouth daily before breakfast. 90 tablet 0   glucose blood (ACCU-CHEK GUIDE) test strip TEST BLOOD SUGAR 1 TO 2 TIMES DAILY 200 strip 3   irbesartan (AVAPRO) 75 MG tablet Take 1 tablet (75 mg total) by mouth daily. 90 tablet 3   metoprolol succinate (TOPROL-XL) 50 MG 24 hr tablet TAKE 1 TABLET EVERY DAY WITH A MEAL OR IMMEDIATELY FOLLOWING A MEAL 90 tablet 3   potassium chloride SA (KLOR-CON M) 20 MEQ tablet Take 1 tablet (20 mEq total) by mouth daily. 90 tablet 3   rosuvastatin (CRESTOR) 20 MG tablet Take 1  tablet (20 mg total) by mouth daily. 90 tablet 3   Semaglutide,0.25 or 0.5MG /DOS, 2 MG/3ML SOPN Inject 0.5 mg into the skin once a week.     triamcinolone cream (KENALOG) 0.1 % Apply 2 times daily, small amount on right hand for 2 weeks then 1-2 times per week. 30 g 2   diclofenac Sodium (VOLTAREN) 1 % GEL Apply 2 g topically 4 (four) times daily. (Patient not taking: Reported on 08/05/2023) 150 g  3   No current facility-administered medications on file prior to visit.    Allergies  Allergen Reactions   Penicillins Other (See Comments)    Per pt blacked out after injection as a child.  Did it involve swelling of the face/tongue/throat, SOB, or low BP? N Did it involve sudden or severe rash/hives, skin peeling, or any reaction on the inside of your mouth or nose? N Did you need to seek medical attention at a hospital or doctor's office? N When did it last happen?   Several Decades Ago    If all above answers are "NO", may proceed with cephalosporin use.        Physical Exam Vitals requested from patient and listed below if patient had equipment and was able to obtain at home for this virtual visit: There were no vitals filed for this visit. Estimated body mass index is 39.82 kg/m as calculated from the following:   Height as of this encounter: 5' 5.5" (1.664 m).   Weight as of this encounter: 243 lb (110.2 kg).  EKG (optional): deferred due to virtual visit  GENERAL: alert, oriented, no acute distress detected; full vision exam deferred due to pandemic and/or virtual encounter  HEENT: atraumatic, conjunttiva clear, no obvious abnormalities on inspection of external nose and ears  NECK: normal movements of the head and neck  LUNGS: on inspection no signs of respiratory distress, breathing rate appears normal, no obvious gross SOB, gasping or wheezing  CV: no obvious cyanosis  MS: moves all visible extremities without noticeable abnormality  PSYCH/NEURO: pleasant and  cooperative, no obvious depression or anxiety, speech and thought processing grossly intact, Cognitive function grossly intact  Flowsheet Row Video Visit from 08/05/2023 in [facility] [place]  PHQ-9 Total Score 3           08/05/2023   10:08 AM 07/08/2023   10:07 AM 06/09/2023    9:41 AM 03/03/2023    9:09 AM 12/16/2022    9:23 AM  Depression screen PHQ 2/9  Decreased Interest 0 0 0 0 0  Down, Depressed, Hopeless 0 0 0 0 1  PHQ - 2 Score 0 0 0 0 1  Altered sleeping 1    1  Tired, decreased energy 1    1  Change in appetite 1    1  Feeling bad or failure about yourself  0    0  Trouble concentrating 0    0  Moving slowly or fidgety/restless 0    0  Suicidal thoughts 0    0  PHQ-9 Score 3    4  Difficult doing work/chores     Not difficult at all       12/16/2022    9:23 AM 03/03/2023    9:09 AM 06/09/2023    9:41 AM 07/08/2023   10:00 AM 08/05/2023   10:08 AM  Fall Risk  Falls in the past year? 0 0 0 0 0  Was there an injury with Fall? 0 0 0  0  Fall Risk Category Calculator 0 0 0  0  Fall Risk Category (Retired) Low      (RETIRED) Patient Fall Risk Level Low fall risk      Patient at Risk for Falls Due to Other (Comment) Other (Comment) Other (Comment)    Fall risk Follow up Falls evaluation completed Falls evaluation completed Falls evaluation completed  Falls evaluation completed     SUMMARY AND PLAN:  Encounter for Medicare annual  wellness exam  Type 2 diabetes mellitus with vascular disease (HCC) - Plan: Ambulatory referral to Ophthalmology, Ambulatory referral to Social Work  Essential hypertension - Plan: Ambulatory referral to Social Work  Hyperlipidemia, unspecified hyperlipidemia type - Plan: Ambulatory referral to Social Work   Discussed applicable health maintenance/preventive health measures and advised and referred or ordered per patient preferences: -sent referral for the eye doctor -discussed vaccines due and where to get  each -he reports he had the shingles vaccines Health Maintenance  Topic Date Due   OPHTHALMOLOGY EXAM  03/27/2023   COVID-19 Vaccine (10 - 2023-24 season) 08/21/2023 (Originally 06/09/2023)   Zoster Vaccines- Shingrix (2 of 2) 12/24/2023 (Originally 11/10/2022)   INFLUENZA VACCINE  03/20/2024 (Originally 07/22/2023)   HEMOGLOBIN A1C  12/09/2023   Diabetic kidney evaluation - Urine ACR  12/17/2023   FOOT EXAM  12/17/2023   Diabetic kidney evaluation - eGFR measurement  06/08/2024   Medicare Annual Wellness (AWV)  08/04/2024   Colonoscopy  07/30/2025   Pneumonia Vaccine 34+ Years old  Completed   Hepatitis C Screening  Completed   HPV VACCINES  Aged Out   DTaP/Tdap/Td  Discontinued   Fecal DNA (Cologuard)  Discontinued    Education and counseling on the following was provided based on the above review of health and a plan/checklist for the patient, along with additional information discussed, was provided for the patient in the patient instructions :  -Advised on importance of completing advanced directives, discussed options for completing and provided information in patient instructions as well -Provided safe balance exercises that can be done at home to improve balance and discussed exercise guidelines for adults with include balance exercises at least 3 days per week.  -Advised and counseled on a healthy lifestyle  -Reviewed patient's current diet. Advised and counseled on a whole foods based healthy diet. A summary of a healthy diet was provided in the Patient Instructions.  -reviewed patient's current physical activity level and discussed exercise guidelines for adults. Discussed community resources and ideas for safe exercise at home to assist in meeting exercise guideline recommendations in a safe and healthy way.  -Advise yearly dental visits at minimum and regular eye exams   Follow up: see patient instructions   Patient Instructions  I really enjoyed getting to talk with you  today! I am available on Tuesdays and Thursdays for virtual visits if you have any questions or concerns, or if I can be of any further assistance.   CHECKLIST FROM ANNUAL WELLNESS VISIT:  -Follow up (please call to schedule if not scheduled after visit):   -yearly for annual wellness visit with primary care office  Here is a list of your preventive care/health maintenance measures and the plan for each if any are due:  PLAN For any measures below that may be due:   Health Maintenance  Topic Date Due   OPHTHALMOLOGY EXAM  03/27/2023   COVID-19 Vaccine (10 - 2023-24 season) 08/21/2023 (Originally 06/09/2023)   Zoster Vaccines- Shingrix (2 of 2) 12/24/2023 (Originally 11/10/2022)   INFLUENZA VACCINE  03/20/2024 (Originally 07/22/2023)   HEMOGLOBIN A1C  12/09/2023   Diabetic kidney evaluation - Urine ACR  12/17/2023   FOOT EXAM  12/17/2023   Diabetic kidney evaluation - eGFR measurement  06/08/2024   Medicare Annual Wellness (AWV)  08/04/2024   Colonoscopy  07/30/2025   Pneumonia Vaccine 70+ Years old  Completed   Hepatitis C Screening  Completed   HPV VACCINES  Aged Out   DTaP/Tdap/Td  Discontinued  Fecal DNA (Cologuard)  Discontinued    -See a dentist at least yearly  -Get your eyes checked and then per your eye specialist's recommendations  -Other issues addressed today:   -I have included below further information regarding a healthy whole foods based diet, physical activity guidelines for adults, stress management and opportunities for social connections. I hope you find this information useful.   -----------------------------------------------------------------------------------------------------------------------------------------------------------------------------------------------------------------------------------------------------------  NUTRITION: -eat real food: lots of colorful vegetables (half the plate) and fruits -5-7 servings of vegetables and fruits per day  (fresh or steamed is best), exp. 2 servings of vegetables with lunch and dinner and 2 servings of fruit per day. Berries and greens such as kale and collards are great choices.  -consume on a regular basis: whole grains (make sure first ingredient on label contains the word "whole"), fresh fruits, fish, nuts, seeds, healthy oils (such as olive oil, avocado oil, grape seed oil) -may eat small amounts of dairy and lean meat on occasion, but avoid processed meats such as ham, bacon, lunch meat, etc. -drink water -try to avoid fast food and pre-packaged foods, processed meat -most experts advise limiting sodium to < 2300mg  per day, should limit further is any chronic conditions such as high blood pressure, heart disease, diabetes, etc. The American Heart Association advised that < 1500mg  is is ideal -try to avoid foods that contain any ingredients with names you do not recognize  -try to avoid sugar/sweets (except for the natural sugar that occurs in fresh fruit) -try to avoid sweet drinks -try to avoid white rice, white bread, pasta (unless whole grain), white or yellow potatoes  EXERCISE GUIDELINES FOR ADULTS: -if you wish to increase your physical activity, do so gradually and with the approval of your doctor -STOP and seek medical care immediately if you have any chest pain, chest discomfort or trouble breathing when starting or increasing exercise  -move and stretch your body, legs, feet and arms when sitting for long periods -Physical activity guidelines for optimal health in adults: -least 150 minutes per week of aerobic exercise (can talk, but not sing) once approved by your doctor, 20-30 minutes of sustained activity or two 10 minute episodes of sustained activity every day.  -resistance training at least 2 days per week if approved by your doctor -balance exercises 3+ days per week:   Stand somewhere where you have something sturdy to hold onto if you lose balance.    1) lift up on toes,  start with 5x per day and work up to 20x   2) stand and lift on leg straight out to the side so that foot is a few inches of the floor, start with 5x each side and work up to 20x each side   3) stand on one foot, start with 5 seconds each side and work up to 20 seconds on each side  If you need ideas or help with getting more active:  -Silver sneakers https://tools.silversneakers.com  -Walk with a Doc: http://www.duncan-williams.com/  -try to include resistance (weight lifting/strength building) and balance exercises twice per week: or the following link for ideas: http://castillo-powell.com/  BuyDucts.dk  STRESS MANAGEMENT: -can try meditating, or just sitting quietly with deep breathing while intentionally relaxing all parts of your body for 5 minutes daily -if you need further help with stress, anxiety or depression please follow up with your primary doctor or contact the wonderful folks at [facility]: [phone]  SOCIAL CONNECTIONS: -options in [place] if you wish to engage in more social  and exercise related activities:  -Silver sneakers https://tools.silversneakers.com  -Walk with a Doc: http://www.duncan-williams.com/  -Check out the [facility] Active Adults 50+ section on the [place] [employer] (hiking clubs, book clubs, cards and games, chess, exercise classes, aquatic classes and much more) - see the website for details: https://www.-[place].gov/departments/parks-recreation/active-adults50  -YouTube has lots of exercise videos for different ages and abilities as well  -[facility] (a variety of indoor and outdoor inperson activities for adults). [phone]. [address]  -Virtual Online Classes (a variety of topics): see seniorplanet.org or call [phone]  -consider volunteering at a school, hospice center, church, senior center or  elsewhere   ADVANCED HEALTHCARE DIRECTIVES:  Everyone should have advanced health care directives in place. This is so that you get the care you want, should you ever be in a situation where you are unable to make your own medical decisions.   From the  Advanced Directive Website: "Advance Health Care Directives are legal documents in which you give written instructions about your health care if, in the future, you cannot speak for yourself.   A health care power of attorney allows you to name a person you trust to make your health care decisions if you cannot make them yourself. A declaration of a desire for a natural death (or living will) is document, which states that you desire not to have your life prolonged by extraordinary measures if you have a terminal or incurable illness or if you are in a vegetative state. An advance instruction for mental health treatment makes a declaration of instructions, information and preferences regarding your mental health treatment. It also states that you are aware that the advance instruction authorizes a mental health treatment provider to act according to your wishes. It may also outline your consent or refusal of mental health treatment. A declaration of an anatomical gift allows anyone over the age of 16 to make a gift by will, organ donor card or other document."   Please see the following website or an elder law attorney for forms, FAQs and for completion of advanced directives: [place] TEFL teacher Health Care Directives Advance Health Care Directives (http://guzman.com/)  Or copy and paste the following to your web browser: PoshChat.fi          Terressa Koyanagi, DO

## 2023-08-11 ENCOUNTER — Telehealth: Payer: Self-pay | Admitting: *Deleted

## 2023-08-11 NOTE — Telephone Encounter (Signed)
   Telephone encounter was:  Successful.  08/11/2023 Name: Charles [name] MRN: [id number] DOB: 04-29-1951  Charles [name] is a 72 y.o. year old male who is a primary care patient of [place], Timoteo Expose, MD . The community resource team was consulted for assistance with Transportation Needs   Care guide performed the following interventions: Patient provided with information about care guide support team and interviewed to confirm resource needs.Patient had a video appt gave him the concierge line number as it is today and told him to call for Future appts   Follow Up Plan:  No further follow up planned at this time. The patient has been provided with needed resources.  Charles [name] -[facility] [place], Population Health [phone] [address] [place] , [place] [address] Email : [name]

## 2023-08-12 ENCOUNTER — Ambulatory Visit: Payer: Self-pay

## 2023-08-12 NOTE — Patient Instructions (Signed)
Visit Information  Thank you for taking time to visit with me today. Please don't hesitate to contact me if I can be of assistance to you.   Following are the goals we discussed today:   Goals Addressed             This Visit's Progress    Health Management       Patient Goals/Self Care Activities: -Patient/Caregiver will self-administer medications as prescribed as evidenced by self-report/primary caregiver report  -Patient/Caregiver will attend all scheduled provider appointments as evidenced by clinician review of documented attendance to scheduled appointments and patient/caregiver report -Patient/Caregiver will call provider office for new concerns or questions as evidenced by review of documented incoming telephone call notes and patient report    Patient doing okay.  He is dealing with [facility] closing.  He wants to go with [facility]. Message to physician and team about sending medications to [facility].  Blood sugars are normal per patient.  Less than 120.  Discussed diabetes management.        Our next appointment is by telephone on 09/16/23 at 1000 am  Please call the care guide team at [phone] if you need to cancel or reschedule your appointment.   If you are experiencing a Mental Health or Behavioral Health Crisis or need someone to talk to, please call the Suicide and Crisis Lifeline: 988   Patient verbalizes understanding of instructions and care plan provided today and agrees to view in MyChart. Active MyChart status and patient understanding of how to access instructions and care plan via MyChart confirmed with patient.     The patient has been provided with contact information for the care management team and has been advised to call with any health related questions or concerns.   [name], RN, MSN [facility], [facility] Management Community Coordinator Direct Dial: [phone]  Fax: [phone] Website:  [email or web address]

## 2023-08-12 NOTE — Patient Outreach (Signed)
  Care Coordination   Follow Up Visit Note   08/12/2023 Name: SHEIK BOGACKI MRN: [id number] DOB: Apr 26, 1951  VADIN SHELLY is a 72 y.o. year old male who sees [place], Timoteo Expose, MD for primary care. I spoke with  [name] by phone today.  What matters to the patients health and wellness today?  Getting medications.     Goals Addressed             This Visit's Progress    Health Management       Patient Goals/Self Care Activities: -Patient/Caregiver will self-administer medications as prescribed as evidenced by self-report/primary caregiver report  -Patient/Caregiver will attend all scheduled provider appointments as evidenced by clinician review of documented attendance to scheduled appointments and patient/caregiver report -Patient/Caregiver will call provider office for new concerns or questions as evidenced by review of documented incoming telephone call notes and patient report    Patient doing okay.  He is dealing with [facility] closing.  He wants to go with [facility]. Message to physician and team about sending medications to [facility].  Blood sugars are normal per patient.  Less than 120.  Discussed diabetes management.        SDOH assessments and interventions completed:  Yes     Care Coordination Interventions:  Yes, provided   Follow up plan: Follow up call scheduled for September    Encounter Outcome:  Pt. Visit Completed   [name], RN, MSN [facility]  [facility], [facility] Management Community Coordinator Direct Dial: [phone]  Fax: [phone] Website: [email or web address]

## 2023-08-13 ENCOUNTER — Telehealth: Payer: Self-pay | Admitting: Family Medicine

## 2023-08-13 ENCOUNTER — Telehealth: Payer: Self-pay

## 2023-08-13 DIAGNOSIS — I10 Essential (primary) hypertension: Secondary | ICD-10-CM

## 2023-08-13 DIAGNOSIS — E1159 Type 2 diabetes mellitus with other circulatory complications: Secondary | ICD-10-CM

## 2023-08-13 DIAGNOSIS — E785 Hyperlipidemia, unspecified: Secondary | ICD-10-CM

## 2023-08-13 DIAGNOSIS — L298 Other pruritus: Secondary | ICD-10-CM

## 2023-08-13 DIAGNOSIS — E876 Hypokalemia: Secondary | ICD-10-CM

## 2023-08-13 DIAGNOSIS — E118 Type 2 diabetes mellitus with unspecified complications: Secondary | ICD-10-CM

## 2023-08-13 MED ORDER — METOPROLOL SUCCINATE ER 50 MG PO TB24
ORAL_TABLET | ORAL | 3 refills | Status: DC
Start: 2023-08-13 — End: 2024-01-21

## 2023-08-13 MED ORDER — POTASSIUM CHLORIDE CRYS ER 20 MEQ PO TBCR
20.0000 meq | EXTENDED_RELEASE_TABLET | Freq: Every day | ORAL | 3 refills | Status: DC
Start: 2023-08-13 — End: 2023-11-08

## 2023-08-13 MED ORDER — EMPAGLIFLOZIN 25 MG PO TABS
25.0000 mg | ORAL_TABLET | Freq: Every day | ORAL | 3 refills | Status: DC
Start: 2023-08-13 — End: 2024-08-01

## 2023-08-13 MED ORDER — TRIAMCINOLONE ACETONIDE 0.1 % EX CREA
TOPICAL_CREAM | CUTANEOUS | 2 refills | Status: AC
Start: 2023-08-13 — End: ?

## 2023-08-13 MED ORDER — ACCU-CHEK GUIDE VI STRP
ORAL_STRIP | 3 refills | Status: DC
Start: 2023-08-13 — End: 2023-11-08

## 2023-08-13 MED ORDER — ALBUTEROL SULFATE HFA 108 (90 BASE) MCG/ACT IN AERS: 2.0000 | INHALATION_SPRAY | Freq: Four times a day (QID) | RESPIRATORY_TRACT | 1 refills | Status: DC | PRN

## 2023-08-13 MED ORDER — AMLODIPINE BESYLATE 10 MG PO TABS: 10.0000 mg | ORAL_TABLET | Freq: Every day | ORAL | 2 refills | Status: DC

## 2023-08-13 MED ORDER — ALLOPURINOL 300 MG PO TABS: 300.0000 mg | ORAL_TABLET | Freq: Every day | ORAL | 2 refills | Status: DC

## 2023-08-13 MED ORDER — BD SWAB SINGLE USE REGULAR PADS: 1.0000 | MEDICATED_PAD | Freq: Every day | 3 refills | Status: DC

## 2023-08-13 MED ORDER — IRBESARTAN 75 MG PO TABS: 75.0000 mg | ORAL_TABLET | Freq: Every day | ORAL | 3 refills | Status: DC

## 2023-08-13 MED ORDER — ROSUVASTATIN CALCIUM 20 MG PO TABS: 20.0000 mg | ORAL_TABLET | Freq: Every day | ORAL | 3 refills | Status: DC

## 2023-08-13 MED ORDER — GLIMEPIRIDE 2 MG PO TABS: 2.0000 mg | ORAL_TABLET | Freq: Every day | ORAL | 0 refills | Status: DC

## 2023-08-13 MED ORDER — ACCU-CHEK FASTCLIX LANCETS MISC
3 refills | Status: DC
Start: 2023-08-13 — End: 2023-12-20

## 2023-08-13 NOTE — Telephone Encounter (Signed)
Rx's that are prescribed by PCP have been sent.

## 2023-08-13 NOTE — Patient Outreach (Signed)
  Care Coordination   Follow Up Visit Note   08/13/2023 Name: Charles [name] MRN: [id number] DOB: [birth year]  Charles [name] is a 72 y.o. year old male who sees [place], Timoteo Expose, MD for primary care. I spoke with  Charles [name] by phone today. Also spoke with Dr. Elvis Coil office.   What matters to the patients health and wellness today?  Getting medications switched to [facility].     Goals Addressed             This Visit's Progress    Health Management       Patient Goals/Self Care Activities: -Patient/Caregiver will self-administer medications as prescribed as evidenced by self-report/primary caregiver report  -Patient/Caregiver will attend all scheduled provider appointments as evidenced by clinician review of documented attendance to scheduled appointments and patient/caregiver report -Patient/Caregiver will call provider office for new concerns or questions as evidenced by review of documented incoming telephone call notes and patient report    Patient doing okay.  He is dealing with [facility] closing.  He wants to go with [facility]. Message to physician and team about sending medications to [facility].  Blood sugars are normal per patient.  Less than 120.  Discussed diabetes management.  Update 08/13/23 Telephone call to Dr. Elvis Coil office to notify them to send refills for all medications to [facility] as [facility] is closing.  Message left with front office.  Telephone call to patient to update as well.  He verbalized understanding. Patient also advised to call for further concerns.         SDOH assessments and interventions completed:  Yes     Care Coordination Interventions:  Yes, provided   Follow up plan: Follow up call scheduled for September    Encounter Outcome:  Pt. Visit Completed   [name], RN, MSN [facility]  [facility], [facility] Management Community Coordinator Direct Dial: [phone]  Fax:  [phone] Website: [email or web address]

## 2023-08-13 NOTE — Telephone Encounter (Addendum)
[employer] called to request the following refills, on behalf of the Pt:  Accu-Chek FastClix Lancets MISC albuterol (VENTOLIN HFA) 108 (90 Base) MCG/ACT inhaler Alcohol Swabs (B-D SINGLE USE SWABS REGULAR) PADS allopurinol (ZYLOPRIM) 300 MG tablet amLODipine (NORVASC) 10 MG tablet diclofenac Sodium (VOLTAREN) 1 % GEL empagliflozin (JARDIANCE) 25 MG TABS tablet glimepiride (AMARYL) 2 MG tablet glucose blood (ACCU-CHEK GUIDE) test strip irbesartan (AVAPRO) 75 MG tablet metoprolol succinate (TOPROL-XL) 50 MG 24 hr tablet potassium chloride SA (KLOR-CON M) 20 MEQ tablet rosuvastatin (CRESTOR) 20 MG tablet Semaglutide,0.25 or 0.5MG /DOS, 2 MG/3ML SOPN triamcinolone cream (KENALOG) 0.1 %  [employer] states [facility] will be officially closed on 08/27/23, and they do not want him to run out.  Please send via:  [facility] - [place], [place] - [address] [phone] [address] [place] [address]

## 2023-08-13 NOTE — Patient Instructions (Signed)
Visit Information  Thank you for taking time to visit with me today. Please don't hesitate to contact me if I can be of assistance to you.   Following are the goals we discussed today:   Goals Addressed             This Visit's Progress    Health Management       Patient Goals/Self Care Activities: -Patient/Caregiver will self-administer medications as prescribed as evidenced by self-report/primary caregiver report  -Patient/Caregiver will attend all scheduled provider appointments as evidenced by clinician review of documented attendance to scheduled appointments and patient/caregiver report -Patient/Caregiver will call provider office for new concerns or questions as evidenced by review of documented incoming telephone call notes and patient report    Patient doing okay.  He is dealing with [facility] closing.  He wants to go with [facility]. Message to physician and team about sending medications to [facility].  Blood sugars are normal per patient.  Less than 120.  Discussed diabetes management.  Update 08/13/23 Telephone call to Dr. Elvis Coil office to notify them to send refills for all medications to [facility] as [facility] is closing.  Message left with front office.  Telephone call to patient to update as well.  He verbalized understanding. Patient also advised to call for further concerns.         Our next appointment is by telephone on 09/16/23 at 1000 am  Please call the care guide team at [phone] if you need to cancel or reschedule your appointment.   If you are experiencing a Mental Health or Behavioral Health Crisis or need someone to talk to, please call the Suicide and Crisis Lifeline: 988   Patient verbalizes understanding of instructions and care plan provided today and agrees to view in MyChart. Active MyChart status and patient understanding of how to access instructions and care plan via MyChart confirmed with patient.     The patient has been provided with  contact information for the care management team and has been advised to call with any health related questions or concerns.   [name], RN, MSN [facility], [facility] Management Community Coordinator Direct Dial: [phone]  Fax: [phone] Website: [email or web address]

## 2023-08-16 ENCOUNTER — Telehealth: Payer: Self-pay

## 2023-08-16 MED ORDER — IRBESARTAN 75 MG PO TABS
75.0000 mg | ORAL_TABLET | Freq: Every day | ORAL | 0 refills | Status: DC
Start: 2023-08-16 — End: 2023-09-13

## 2023-08-16 MED ORDER — ROSUVASTATIN CALCIUM 20 MG PO TABS: 20.0000 mg | ORAL_TABLET | Freq: Every day | ORAL | 0 refills | Status: DC

## 2023-08-16 NOTE — Patient Outreach (Signed)
  Care Coordination   Follow Up Visit Note   08/16/2023 Name: Charles [name] MRN: [id number] DOB: 02-Jun-1951  Charles [name] is a 72 y.o. year old male who sees Charles [name], Charles Expose, MD for primary care. I spoke with  Charles [name] by phone today.  What matters to the patients health and wellness today?  Medication refills    Goals Addressed             This Visit's Progress    Health Management       Patient Goals/Self Care Activities: -Patient/Caregiver will self-administer medications as prescribed as evidenced by self-report/primary caregiver report  -Patient/Caregiver will attend all scheduled provider appointments as evidenced by clinician review of documented attendance to scheduled appointments and patient/caregiver report -Patient/Caregiver will call provider office for new concerns or questions as evidenced by review of documented incoming telephone call notes and patient report    Patient doing okay.  He is dealing with [facility] closing.  He wants to go with [facility]. Message to physician and team about sending medications to [facility].  Blood sugars are normal per patient.  Less than 120.  Discussed diabetes management.  Update 08/13/23 Telephone call to Dr. Elvis [name] office to notify them to send refills for all medications to [facility] as [facility] is closing.  Message left with front office.  Telephone call to patient to update as well.  He verbalized understanding. Patient also advised to call for further concerns.   08/16/23 Spoke with patient he is out of his irbesartan and only has a couple of days of Crestor.  Advised that the refills could be sent to a local pharmacy until he gets his medication from [facility] as they were sent on Friday. He verbalized understanding.  I advised that CM would call PCP office to request.  He [facility] [address]  Advised [facility] to [facility] afternoon to see if medication refill is  there and ready to be picked up.  Also advised to call CM for any further problems or questions.  He verbalized understanding. CM called Dr. Elvis [name] office requested medications as he has run out to be sent to local pharmacy. Message sent back to the provider for approval.            SDOH assessments and interventions completed:  Yes     Care Coordination Interventions:  Yes, provided   Follow up plan: Follow up call scheduled for 09/16/23    Encounter Outcome:  Pt. Visit Completed   Charles [name] Charles Jo, RN, MSN [place]  [facility], [facility] Management Community Coordinator Direct Dial: [phone]  Fax: [phone] Website: [email or web address]

## 2023-08-16 NOTE — Addendum Note (Signed)
Addended by: Kathreen Devoid on: 08/16/2023 09:12 AM   Modules accepted: Orders

## 2023-08-16 NOTE — Telephone Encounter (Signed)
Charles [name] called to request that a 30 day supply of each of the following refills be sent to: [id number] [place], [place] - [address] [facility] [address] Phone: [phone]  Fax: [phone]      irbesartan (AVAPRO) 75 MG tablet rosuvastatin (CRESTOR) 20 MG tablet   This is because Pt is almost or just about completely out and [facility] will take too long.

## 2023-08-16 NOTE — Patient Instructions (Signed)
Visit Information  Thank you for taking time to visit with me today. Please don't hesitate to contact me if I can be of assistance to you.   Following are the goals we discussed today:   Goals Addressed             This Visit's Progress    Health Management       Patient Goals/Self Care Activities: -Patient/Caregiver will self-administer medications as prescribed as evidenced by self-report/primary caregiver report  -Patient/Caregiver will attend all scheduled provider appointments as evidenced by clinician review of documented attendance to scheduled appointments and patient/caregiver report -Patient/Caregiver will call provider office for new concerns or questions as evidenced by review of documented incoming telephone call notes and patient report    Patient doing okay.  He is dealing with [facility] closing.  He wants to go with [facility]. Message to physician and team about sending medications to [facility].  Blood sugars are normal per patient.  Less than 120.  Discussed diabetes management.  Update 08/13/23 Telephone call to Dr. Elvis Coil office to notify them to send refills for all medications to [facility] as [facility] is closing.  Message left with front office.  Telephone call to patient to update as well.  He verbalized understanding. Patient also advised to call for further concerns.   08/16/23 Spoke with patient he is out of his irbesartan and only has a couple of days of Crestor.  Advised that the refills could be sent to a local pharmacy until he gets his medication from [facility] as they were sent on Friday. He verbalized understanding.  I advised that CM would call PCP office to request.  He [facility] [address]  Advised [facility] to [facility] afternoon to see if medication refill is there and ready to be picked up.  Also advised to call CM for any further problems or questions.  He verbalized understanding. CM called Dr.  Elvis Coil office requested medications as he has run out to be sent to local pharmacy. Message sent back to the provider for approval.            Our next appointment is by telephone on 09/16/23 at 1000 am  Please call the care guide team at [phone] if you need to cancel or reschedule your appointment.   If you are experiencing a Mental Health or Behavioral Health Crisis or need someone to talk to, please call the Suicide and Crisis Lifeline: 988   Patient verbalizes understanding of instructions and care plan provided today and agrees to view in MyChart. Active MyChart status and patient understanding of how to access instructions and care plan via MyChart confirmed with patient.     The patient has been provided with contact information for the care management team and has been advised to call with any health related questions or concerns.   [name], RN, MSN [facility], [facility] Management Community Coordinator Direct Dial: [phone]  Fax: [phone] Website: [email or web address]

## 2023-08-16 NOTE — Telephone Encounter (Signed)
Rxs sent

## 2023-08-17 ENCOUNTER — Telehealth: Payer: Self-pay

## 2023-08-17 NOTE — Patient Outreach (Signed)
  Care Coordination   Follow Up Visit Note   08/17/2023 Name: Charles [name] MRN: [id number] DOB: September 05, 1951  Charles [name] is a 72 y.o. year old male who sees [place], Timoteo Expose, MD for primary care. I spoke with  [name] by phone today.  What matters to the patients health and wellness today?  Medications    Goals Addressed             This Visit's Progress    Health Management       Patient Goals/Self Care Activities: -Patient/Caregiver will self-administer medications as prescribed as evidenced by self-report/primary caregiver report  -Patient/Caregiver will attend all scheduled provider appointments as evidenced by clinician review of documented attendance to scheduled appointments and patient/caregiver report -Patient/Caregiver will call provider office for new concerns or questions as evidenced by review of documented incoming telephone call notes and patient report    Patient doing okay.  He is dealing with [facility] closing.  He wants to go with [facility]. Message to physician and team about sending medications to [facility].  Blood sugars are normal per patient.  Less than 120.  Discussed diabetes management.  Update 08/13/23 Telephone call to Dr. Elvis Coil office to notify them to send refills for all medications to [facility] as [facility] is closing.  Message left with front office.  Telephone call to patient to update as well.  He verbalized understanding. Patient also advised to call for further concerns.   08/16/23 Spoke with patient he is out of his irbesartan and only has a couple of days of Crestor.  Advised that the refills could be sent to a local pharmacy until he gets his medication from [facility] as they were sent on Friday. He verbalized understanding.  I advised that CM would call PCP office to request.  He [facility] [address]  Advised [facility] to [facility] to see if medication refill is there and  ready to be picked up.  Also advised to call CM for any further problems or questions.  He verbalized understanding. CM called Dr. Elvis Coil office requested medications as he has run out to be sent to local pharmacy. Message sent back to the provider for approval.      08/17/23 Spoke with patient he reports he [facility]  Discussed that [facility] should be supplying medications from now on as physician has sent long term medications refills there. He verbalized understanding.          SDOH assessments and interventions completed:  Yes     Care Coordination Interventions:  Yes, provided   Follow up plan: Follow up call scheduled for September    Encounter Outcome:  Pt. Visit Completed   [name], RN, MSN [facility]  [facility], [facility] Management Community Coordinator Direct Dial: [phone]  Fax: [phone] Website: [email or web address]

## 2023-08-17 NOTE — Patient Instructions (Signed)
Visit Information  Thank you for taking time to visit with me today. Please don't hesitate to contact me if I can be of assistance to you.   Following are the goals we discussed today:   Goals Addressed             This Visit's Progress    Health Management       Patient Goals/Self Care Activities: -Patient/Caregiver will self-administer medications as prescribed as evidenced by self-report/primary caregiver report  -Patient/Caregiver will attend all scheduled provider appointments as evidenced by clinician review of documented attendance to scheduled appointments and patient/caregiver report -Patient/Caregiver will call provider office for new concerns or questions as evidenced by review of documented incoming telephone call notes and patient report    Patient doing okay.  He is dealing with [facility] closing.  He wants to go with [facility]. Message to physician and team about sending medications to [facility].  Blood sugars are normal per patient.  Less than 120.  Discussed diabetes management.  Update 08/13/23 Telephone call to Dr. Elvis Coil office to notify them to send refills for all medications to [facility] as [facility] is closing.  Message left with front office.  Telephone call to patient to update as well.  He verbalized understanding. Patient also advised to call for further concerns.   08/16/23 Spoke with patient he is out of his irbesartan and only has a couple of days of Crestor.  Advised that the refills could be sent to a local pharmacy until he gets his medication from [facility] as they were sent on Friday. He verbalized understanding.  I advised that CM would call PCP office to request.  He [facility] [address]  Advised patient to [facility] later this afternoon to see if medication refill is there and ready to be picked up.  Also advised to call CM for any further problems or questions.  He verbalized understanding. CM called Dr.  Elvis Coil office requested medications as he has run out to be sent to local pharmacy. Message sent back to the provider for approval.      08/17/23 Spoke with patient he reports he [facility]  Discussed that [facility] should be supplying medications from now on as physician has sent long term medications refills there. He verbalized understanding.          Our next appointment is by telephone on 09/16/23 at 1000 am  Please call the care guide team at [phone] if you need to cancel or reschedule your appointment.   If you are experiencing a Mental Health or Behavioral Health Crisis or need someone to talk to, please call the Suicide and Crisis Lifeline: 988   Patient verbalizes understanding of instructions and care plan provided today and agrees to view in MyChart. Active MyChart status and patient understanding of how to access instructions and care plan via MyChart confirmed with patient.     The patient has been provided with contact information for the care management team and has been advised to call with any health related questions or concerns.   [name], RN, MSN [facility], [facility] Management Community Coordinator Direct Dial: [phone]  Fax: [phone] Website: [email or web address]

## 2023-08-20 ENCOUNTER — Other Ambulatory Visit: Payer: Self-pay

## 2023-09-02 ENCOUNTER — Other Ambulatory Visit: Payer: Medicare PPO

## 2023-09-02 NOTE — Progress Notes (Signed)
   09/02/2023  Patient ID: Charles [name], male   DOB: 10-29-1951, 72 y.o.   MRN: [id number]  BP monitor order sent via [facility] standing order to DME supplier through [facility] to be billed to Medicare B.  [name], PharmD, DPLA

## 2023-09-02 NOTE — Progress Notes (Signed)
09/02/2023 Name: Charles [name] MRN: [id number] DOB: Jan 24, 1951  Chief Complaint  Patient presents with   Medication Management   Charles [name] is a 72 y.o. year old male who presented for a telephone visit to follow up on diabetes and hypertension control  Subjective:  Care Team: Primary Care Provider: [place], [name], MD   Medication Access/Adherence  Current Pharmacy:  [facility] - [place], [place] - [address] [place] [address] Phone: [phone] Fax: [phone]  [id number] - [place], [place] - [address] AT CORNER OF [facility] [address] [place] [address] Phone: [phone] Fax: [phone]  -Patient reports affordability concerns with their medications: No  -Patient reports access/transportation concerns to their pharmacy: No  -Patient reports adherence concerns with their medications:  No    Diabetes: Current medications: Jardiance 25mg  daily, glimepiride 2mg  daily, Ozempic 0.5mg  weekly -FBG 98-110 Patient reports hypoglycemic s/sx including dizziness, shakiness, sweating. This has occurred 1-2 times since our last visit 2 months ago, and patient endorses not having regularly timed meals these days. -Patient denies hyperglycemic symptoms including polyuria, polydipsia, polyphagia, nocturia, neuropathy, blurred vision. -Current medication access support: Novo PAP for Ozempic, BI for Jardiance -Patient states his last shipment of Jardiance went to his old address where his nephew lives  Hypertension: Current medications: metoprolol xl 50mg  daily, irbesartan 75mg  daily, amlodipine 10mg  daily -Patient does not have a validated, automated, upper arm home BP cuff Current blood pressure readings readings: last OV 136/80 -Patient denies hypotensive s/sx including dizziness, lightheadedness.  -Patient denies hypertensive symptoms including headache, chest pain, shortness of  breath  Objective: Lab Results  Component Value Date   HGBA1C 6.5 06/09/2023   Lab Results  Component Value Date   CREATININE 1.41 06/09/2023   BUN 14 06/09/2023   NA 138 06/09/2023   K 3.9 06/09/2023   CL 105 06/09/2023   CO2 23 06/09/2023   Lab Results  Component Value Date   CHOL 112 09/04/2022   HDL 39.10 09/04/2022   LDLCALC 57 09/04/2022   TRIG 79.0 09/04/2022   CHOLHDL 3 09/04/2022   Medications Reviewed Today     Reviewed by [name], RPH (Pharmacist) on 09/02/23 at 1055  Med List Status: <None>   Medication Order Taking? Sig Documenting Provider Last Dose Status Informant  Accu-Chek FastClix Lancets MISC [id number] Yes TEST BLOOD SUGARS TWO TIMES DAILY. [place], [name], MD Taking Active   albuterol (VENTOLIN HFA) 108 (90 Base) MCG/ACT inhaler [id number]  Inhale 2 puffs into the lungs every 6 (six) hours as needed for wheezing or shortness of breath. [place], [name], MD  Active   Alcohol Swabs (B-D SINGLE USE SWABS REGULAR) PADS [id number]  1 Device by Does not apply route daily. [place], [name], MD  Active   allopurinol (ZYLOPRIM) 300 MG tablet [id number] Yes Take 1 tablet (300 mg total) by mouth daily. [place], [name], MD Taking Active   amLODipine (NORVASC) 10 MG tablet [id number] Yes Take 1 tablet (10 mg total) by mouth daily. [place], [name], MD Taking Active   aspirin 81 MG tablet [id number]  Take 81 mg by mouth every evening.  [provider]  Active Self, Pharmacy Records  diclofenac Sodium (VOLTAREN) 1 % GEL [id number]  Apply 2 g topically 4 (four) times daily.  Patient not taking: Reported on 08/05/2023   [place], [name], MD  Active   empagliflozin (JARDIANCE) 25 MG TABS tablet [id number] Yes  Take 1 tablet (25 mg total) by mouth daily before breakfast. [place], [name], MD Taking Active            Med Note Littie Deeds, [name]   Thu Sep 02, 2023 10:39 AM) PAP  glimepiride (AMARYL) 2 MG tablet [id number] Yes Take 1 tablet (2 mg total) by mouth daily before  breakfast. [place], [name], MD Taking Active   glucose blood (ACCU-CHEK GUIDE) test strip [id number] Yes TEST BLOOD SUGAR 1 TO 2 TIMES DAILY [place], [name], MD Taking Active   irbesartan (AVAPRO) 75 MG tablet [id number] Yes Take 1 tablet (75 mg total) by mouth daily. [place], [name], MD Taking Active   metoprolol succinate (TOPROL-XL) 50 MG 24 hr tablet [id number] Yes TAKE 1 TABLET EVERY DAY WITH A MEAL OR IMMEDIATELY FOLLOWING A MEAL [place], [name], MD Taking Active   potassium chloride SA (KLOR-CON M) 20 MEQ tablet [id number] Yes Take 1 tablet (20 mEq total) by mouth daily. [place], [name], MD Taking Active   rosuvastatin (CRESTOR) 20 MG tablet [id number] Yes Take 1 tablet (20 mg total) by mouth daily. [place], [name], MD Taking Active   Semaglutide,0.25 or 0.5MG /DOS, 2 MG/3ML Namon Cirri [id number] Yes Inject 0.5 mg into the skin once a week. [provider] Taking Active            Med Note Littie Deeds, [name] Jun 06, 2023 12:41 PM) PAP  triamcinolone cream (KENALOG) 0.1 % [id number]  Apply 2 times daily, small amount on right hand for 2 weeks then 1-2 times per week. [place], [name], MD  Active            Assessment/Plan:   Diabetes: - Currently controlled - Recommend increasing Ozempic to 1mg  weekly and holding glimepiride 2mg  - Consulting Dr. [place]; and if she is in agreement, will work on order change form for Novo PAP - Patient is due for A1c and states he plans to call office to schedule f/u visit - Providing number for patient to call BI PAP to make sure correct address is on file and request Jardiance refill  Hypertension: - Currently moderately controlled - Continue current regimen and regular PCP follow-up - Investigating insurance coverage of BP monitor and/or OTC benefit plan may have that would cover and will f/u with patient  [name], PharmD, DPLA

## 2023-09-03 ENCOUNTER — Ambulatory Visit: Payer: Self-pay

## 2023-09-03 NOTE — Patient Instructions (Signed)
Visit Information  Thank you for taking time to visit with me today. Please don't hesitate to contact me if I can be of assistance to you.   Following are the goals we discussed today:  - Contact your primary care provider regarding concerns for severe gas - Dial 911 if symptoms continue - Engage with [employer] regarding Geographical information systems officer  Our next appointment is by telephone on 9/26 at 12:30  Please call the care guide team at [phone] if you need to cancel or reschedule your appointment.   If you are experiencing a Mental Health or Behavioral Health Crisis or need someone to talk to, please call 1-800-273-TALK (toll free, 24 hour hotline) go to [facility] [address] [place] [phone]) call 911  Patient verbalizes understanding of instructions and care plan provided today and agrees to view in MyChart. Active MyChart status and patient understanding of how to access instructions and care plan via MyChart confirmed with patient.     Jaynie Collins

## 2023-09-03 NOTE — Patient Outreach (Signed)
Care Coordination   Follow Up Visit Note   09/03/2023 Name: Charles [name] MRN: [id number] DOB: 1951/07/15  Charles [name] is a 72 y.o. year old male who sees [place], Timoteo Expose, MD for primary care. I spoke with  Charles [name] by phone today.  What matters to the patients health and wellness today?  Severe gas causing chest pain    Goals Addressed             This Visit's Progress    Care Coordination Activities       Care Coordination Interventions: Collaboration with PharmD who advises patient continues to need assistance with transportation as his health plan does not offer rides at this time Discussed alternative resources including [employer] ([employer]) as well as Engineer, structural Determined the patient has applied for [employer] in the past and was denied service Referral placed to [employer] to request outreach to the patient regarding enrollment into [employer] program Patient reports concerns with ongoing severe gas which he states "feels like a heart attack". Patient reports her has been experiencing this pain since yesterday. He has tried to alleviate symptoms with Clarene Reamer which he reports has been ineffective Advised the patient it is important to be seen today to evaluate cause of chest pain. Patient states he has felt like this in the past and it was gas so he feels it is also gas today Instructed the patient to contact his primary care providers office for guidance on how to treat Instructed the patient to dial 911 if symptoms continue or worsen Collaboration with Dr. [place] as well as RN Care Manager Fleeta Emmer to advise of patients reported concerns with ongoing gas as well as SW interventions of instructing the patient to contact his primary care providers office as well as to call 911 as needed         SDOH assessments and interventions completed:  No     Care Coordination Interventions:  Yes,  provided   Interventions Today    Flowsheet Row Most Recent Value  Chronic Disease   Chronic disease during today's visit Hypertension (HTN), Other  [Transportation Barriers]  General Interventions   General Interventions Discussed/Reviewed [employer], Special educational needs teacher with  Research officer, trade union to [employer] program with [employer] [place]]  Communication with PCP/Specialists, Buyer, retail primary care provider and Medical illustrator of patients report of gas causing chest pain]        Follow up plan:  SW will continue to follow    Encounter Outcome:  Patient Visit Completed   Bevelyn Ngo, BSW, CDP [facility]  [facility], [facility] Social Worker Direct Dial: [phone]  Fax: [phone]

## 2023-09-07 ENCOUNTER — Telehealth: Payer: Self-pay

## 2023-09-07 NOTE — Progress Notes (Signed)
09/07/2023  Patient ID: Charles [name], male   DOB: 1951/06/26, 72 y.o.   MRN: [id number]  Outreach attempt to return missed call/voicemail from patient earlier today.  Patient left message stating he had been without power for several hours and was concerned about stability of Ozempic in the refrigerator.  Ozempic may remain even at room temperature for up to 42 days after first use.  I was not able to reach the patient but left HIPAA compliant voicemail for him to return my call.[name], PharmD, DPLA

## 2023-09-12 ENCOUNTER — Other Ambulatory Visit: Payer: Self-pay | Admitting: Family Medicine

## 2023-09-12 DIAGNOSIS — I10 Essential (primary) hypertension: Secondary | ICD-10-CM

## 2023-09-12 DIAGNOSIS — E1159 Type 2 diabetes mellitus with other circulatory complications: Secondary | ICD-10-CM

## 2023-09-12 DIAGNOSIS — E785 Hyperlipidemia, unspecified: Secondary | ICD-10-CM

## 2023-09-15 ENCOUNTER — Other Ambulatory Visit: Payer: Self-pay

## 2023-09-15 NOTE — Progress Notes (Signed)
09/15/2023  Patient ID: Charles [name], male   DOB: [birth year], 72 y.o.   MRN: [id number]  Pt previously spoke with pharmacist Elnita Maxwell and is interested in increasing Ozempic from 0.5mg  to 1mg  once weekly, as he is tolerating well.  Would follow patient closely to monitor for low blood sugars (has CGM device). May be able to stop glimepiride.  Will consult with PCP regarding med changes listed above.  Sherrill Raring, PharmD Clinical Pharmacist [phone]

## 2023-09-16 ENCOUNTER — Ambulatory Visit: Payer: Self-pay

## 2023-09-16 NOTE — Patient Outreach (Signed)
Care Coordination   Follow Up Visit Note   09/16/2023 Name: Charles [name] MRN: [id number] DOB: Jun 10, 1951  Charles [name] is a 72 y.o. year old male who sees [place], Timoteo Expose, MD for primary care. I spoke with  Charles [name] by phone today.  What matters to the patients health and wellness today?  Identify resources to assist with transportation.    Goals Addressed             This Visit's Progress    Care Coordination Activities       Care Coordination Interventions: Determined the patient has yet to be contacted by [employer] forwarder requesting feedback on the status of patients referral        SDOH assessments and interventions completed:  No     Care Coordination Interventions:  Yes, provided   Interventions Today    Flowsheet Row Most Recent Value  Chronic Disease   Chronic disease during today's visit Diabetes, Hypertension (HTN), Other  [Transportation Barriers]  General Interventions   General Interventions Discussed/Reviewed General Interventions Reviewed, Communication with, [employer] has yet to be contacted by [employer],  collaboration with [employer] requesting feedback on the status of patients referral]        Follow up plan:  SW will continue to follow.    Encounter Outcome:  Patient Visit Completed   Bevelyn Ngo, BSW, CDP [facility]  [facility], [facility] Social Worker Direct Dial: [phone]  Fax: [phone]

## 2023-09-16 NOTE — Patient Instructions (Signed)
Visit Information  Thank you for taking time to visit with me today. Please don't hesitate to contact me if I can be of assistance to you.   Following are the goals we discussed today:   Goals Addressed             This Visit's Progress    Health Management       Patient Goals/Self Care Activities: -Patient/Caregiver will self-administer medications as prescribed as evidenced by self-report/primary caregiver report  -Patient/Caregiver will attend all scheduled provider appointments as evidenced by clinician review of documented attendance to scheduled appointments and patient/caregiver report -Patient/Caregiver will call provider office for new concerns or questions as evidenced by review of documented incoming telephone call notes and patient report    Patient doing good.  Patient reports he dealt with chest pain that has subsided.  Blood sugars are normal per patient.  Ranging 80-120.  Discussed diabetes management.  No concerns.          Our next appointment is by telephone on 10/14/23 at 1000 am  Please call the care guide team at [phone] if you need to cancel or reschedule your appointment.   If you are experiencing a Mental Health or Behavioral Health Crisis or need someone to talk to, please call the Suicide and Crisis Lifeline: 988   Patient verbalizes understanding of instructions and care plan provided today and agrees to view in MyChart. Active MyChart status and patient understanding of how to access instructions and care plan via MyChart confirmed with patient.     The patient has been provided with contact information for the care management team and has been advised to call with any health related questions or concerns.   [name], RN, MSN [facility], [facility] Management Community Coordinator Direct Dial: [phone]  Fax: [phone] Website: [email or web address]

## 2023-09-16 NOTE — Patient Outreach (Signed)
Care Coordination   Follow Up Visit Note   09/16/2023 Name: NORAH LEBEL MRN: [id number] DOB: 08-10-1951  JYSHAWN LASCELLES is a 72 y.o. year old male who sees [place], Timoteo Expose, MD for primary care. I spoke with  [name] by phone today.  What matters to the patients health and wellness today?  Health maintenance    Goals Addressed             This Visit's Progress    Health Management       Patient Goals/Self Care Activities: -Patient/Caregiver will self-administer medications as prescribed as evidenced by self-report/primary caregiver report  -Patient/Caregiver will attend all scheduled provider appointments as evidenced by clinician review of documented attendance to scheduled appointments and patient/caregiver report -Patient/Caregiver will call provider office for new concerns or questions as evidenced by review of documented incoming telephone call notes and patient report    Patient doing good.  Patient reports he dealt with chest pain that has subsided.  Blood sugars are normal per patient.  Ranging 80-120.  Discussed diabetes management.  No concerns.          SDOH assessments and interventions completed:  Yes  SDOH Interventions Today    Flowsheet Row Most Recent Value  SDOH Interventions   Health Literacy Interventions Intervention Not Indicated        Care Coordination Interventions:  Yes, provided   Follow up plan: Follow up call scheduled for October    Encounter Outcome:  Patient Visit Completed   [name], RN, MSN [place]  [facility], [facility] Management Community Coordinator Direct Dial: [phone]  Fax: [phone] Website: [email or web address]

## 2023-09-29 ENCOUNTER — Telehealth: Payer: Self-pay

## 2023-09-29 NOTE — Telephone Encounter (Signed)
Received reorder form for Ozempic from [employer]. Faxing to office of Betty Swaziland at [phone]

## 2023-09-29 NOTE — Telephone Encounter (Signed)
This has already been completed and approved, patient aware and pending shipment. Thank you!

## 2023-09-30 ENCOUNTER — Ambulatory Visit: Payer: Self-pay

## 2023-09-30 ENCOUNTER — Telehealth: Payer: Self-pay

## 2023-09-30 NOTE — Patient Instructions (Signed)
Visit Information  Thank you for taking time to visit with me today. Please don't hesitate to contact me if I can be of assistance to you.   Following are the goals we discussed today:   Goals Addressed             This Visit's Progress    COMPLETED: Care Coordination Activities       Care Coordination Interventions: Patient is now engaged with [employer] for transportation           Please call the care guide team at [phone] if you need to cancel or reschedule your appointment.   If you are experiencing a Mental Health or Behavioral Health Crisis or need someone to talk to, please call the Suicide and Crisis Lifeline: 988 go to [facility] [address], [place] [phone]) call 911  Patient verbalizes understanding of instructions and care plan provided today and agrees to view in MyChart. Active MyChart status and patient understanding of how to access instructions and care plan via MyChart confirmed with patient.     Bevelyn Ngo, BSW, CDP [facility]  [facility], [facility] Social Worker Direct Dial: [phone]  Fax: [phone]

## 2023-09-30 NOTE — Patient Outreach (Signed)
  Care Coordination   Follow Up Visit Note   09/30/2023 Name: DEMITRIUS CRASS MRN: [id number] DOB: March 18, 1951  DREQUAN IRONSIDE is a 72 y.o. year old male who sees [place], Timoteo Expose, MD for primary care. I spoke with  [name] by phone today.  What matters to the patients health and wellness today?  Dicussed that the patient is engaged with Engineer, structural for his transportation needs     Goals Addressed             This Visit's Progress    COMPLETED: Care Coordination Activities       Care Coordination Interventions: Patient is now engaged with Geographical information systems officer for transportation         SDOH assessments and interventions completed:  Yes   SDOH Interventions Today    Flowsheet Row Most Recent Value  SDOH Interventions   Transportation Interventions Other (Comment)  [Patient in engaged with the [employer] resources]        Care Coordination Interventions:  Yes, provided  Interventions Today    Flowsheet Row Most Recent Value  General Interventions   General Interventions Discussed/Reviewed General Interventions Reviewed, [employer] is engaged with the [employer] resources]        Follow up plan: No further intervention required.   Encounter Outcome:  Patient Visit Completed   Bevelyn Ngo, BSW, CDP [facility]  [facility], [facility] Social Worker Direct Dial: [phone]  Fax: [phone]

## 2023-09-30 NOTE — Telephone Encounter (Signed)
Patient returned call. Samples of Jardiance made available at front office. Pharmacist f/u scheduled.

## 2023-09-30 NOTE — Progress Notes (Signed)
   09/30/2023  Patient ID: Charles [name], male   DOB: 06/23/1951, 71 y.o.   MRN: [id number]  Attempted to contact patient to return his call to the office this morning, and to schedule a follow up appointment for medication management/discuss new ozempic dose. Left HIPAA compliant message for patient to return my call at their convenience.

## 2023-10-01 ENCOUNTER — Telehealth: Payer: Self-pay

## 2023-10-01 NOTE — Telephone Encounter (Signed)
Spoke with pt and let him know that his Ozempic is ready for pick up. He will come by and get it today.

## 2023-10-14 ENCOUNTER — Ambulatory Visit: Payer: Self-pay

## 2023-10-14 NOTE — Patient Instructions (Signed)
Visit Information  Thank you for taking time to visit with me today. Please don't hesitate to contact me if I can be of assistance to you.   Following are the goals we discussed today:   Goals Addressed             This Visit's Progress    Health Management       Patient Goals/Self Care Activities: -Patient/Caregiver will self-administer medications as prescribed as evidenced by self-report/primary caregiver report  -Patient/Caregiver will attend all scheduled provider appointments as evidenced by clinician review of documented attendance to scheduled appointments and patient/caregiver report -Patient/Caregiver will call provider office for new concerns or questions as evidenced by review of documented incoming telephone call notes and patient report    Patient doing okay. Blood sugars are normal per patient.  Ranging 80-120.  Discussed diabetes management.  Discussed annual eye exam.  Patient to make appointment. No concerns.          Our next appointment is by telephone on 11/11/23 at 1000 am  Please call the care guide team at [phone] if you need to cancel or reschedule your appointment.   If you are experiencing a Mental Health or Behavioral Health Crisis or need someone to talk to, please call the Suicide and Crisis Lifeline: 988   Patient verbalizes understanding of instructions and care plan provided today and agrees to view in MyChart. Active MyChart status and patient understanding of how to access instructions and care plan via MyChart confirmed with patient.     The patient has been provided with contact information for the care management team and has been advised to call with any health related questions or concerns.   [name], RN, MSN [facility], [facility] Management Community Coordinator Direct Dial: [phone]  Fax: [phone] Website: [email or web address]

## 2023-10-14 NOTE — Patient Outreach (Signed)
  Care Coordination   Follow Up Visit Note   10/14/2023 Name: Charles [name] MRN: [id number] DOB: Jul 26, 1951  Charles [name] is a 72 y.o. year old male who sees [place], Timoteo Expose, MD for primary care. I spoke with  Charles [name] by phone today.  What matters to the patients health and wellness today?  Health maintenance    Goals Addressed             This Visit's Progress    Health Management       Patient Goals/Self Care Activities: -Patient/Caregiver will self-administer medications as prescribed as evidenced by self-report/primary caregiver report  -Patient/Caregiver will attend all scheduled provider appointments as evidenced by clinician review of documented attendance to scheduled appointments and patient/caregiver report -Patient/Caregiver will call provider office for new concerns or questions as evidenced by review of documented incoming telephone call notes and patient report    Patient doing okay. Blood sugars are normal per patient.  Ranging 80-120.  Discussed diabetes management.  Discussed annual eye exam.  Patient to make appointment. No concerns.          SDOH assessments and interventions completed:  Yes     Care Coordination Interventions:  Yes, provided   Follow up plan: Follow up call scheduled for November    Encounter Outcome:  Patient Visit Completed   [name], RN, MSN [facility]  [facility], [facility] Management Community Coordinator Direct Dial: [phone]  Fax: [phone] Website: [email or web address]

## 2023-10-27 ENCOUNTER — Other Ambulatory Visit: Payer: Medicare PPO

## 2023-10-27 NOTE — Progress Notes (Signed)
10/27/2023 Name: Charles [name] MRN: [id number] DOB: 1951/09/18  Chief Complaint  Patient presents with   Diabetes   Medication Management    LADALE [name] is a 73 y.o. year old male who presented for a telephone visit.   They were referred to the pharmacist by their PCP for assistance in managing diabetes.    Subjective:  Care Team: Primary Care Provider: [place], [name], MD ; Next Scheduled Visit: 11/24/23  Medication Access/Adherence  Current Pharmacy:  [facility] - [place], [place] - [address] [place] [address] Phone: [phone] Fax: [phone]  [id number] - , [place] - [address] AT CORNER OF [facility] [address] [place] [address] Phone: [phone] Fax: [phone]   Patient reports affordability concerns with their medications: No  Patient reports access/transportation concerns to their pharmacy: Yes-working with social work now to overcome Patient reports adherence concerns with their medications:  No     Diabetes:  Current medications: Ozempic 1mg , Glimepiride 2mg , Jardiance 25mg  Medications tried in the past:   Current glucose readings: avg 100-120 fasting and post-prandial, has had 1-2 lows in the 60s since increasing the ozempic Checking sugars twice daily, 1 fasting and 1 post-prandial   Patient denies hypoglycemic s/sx including dizziness, shakiness, sweating. Patient denies hyperglycemic symptoms including polyuria, polydipsia, polyphagia, nocturia, neuropathy, blurred vision.  Current medication access support: Jardiance 25mg  through [facility], Ozempic 1mg  through [facility]   Objective:  Lab Results  Component Value Date   HGBA1C 6.5 06/09/2023    Lab Results  Component Value Date   CREATININE 1.41 06/09/2023   BUN 14 06/09/2023   NA 138 06/09/2023   K 3.9 06/09/2023   CL 105 06/09/2023   CO2 23 06/09/2023    Lab Results   Component Value Date   CHOL 112 09/04/2022   HDL 39.10 09/04/2022   LDLCALC 57 09/04/2022   TRIG 79.0 09/04/2022   CHOLHDL 3 09/04/2022    Medications Reviewed Today     Reviewed by Sherrill Raring, RPH (Pharmacist) on 10/27/23 at 1054  Med List Status: <None>   Medication Order Taking? Sig Documenting Provider Last Dose Status Informant  Accu-Chek FastClix Lancets MISC [id number] Yes TEST BLOOD SUGARS TWO TIMES DAILY. [place], [name], MD Taking Active   albuterol (VENTOLIN HFA) 108 [phone] Base) MCG/ACT inhaler [id number] Yes Inhale 2 puffs into the lungs every 6 (six) hours as needed for wheezing or shortness of breath. [place], [name], MD Taking Active   Alcohol Swabs (B-D SINGLE USE SWABS REGULAR) PADS [id number] Yes 1 Device by Does not apply route daily. [place], [name], MD Taking Active   allopurinol (ZYLOPRIM) 300 MG tablet [id number] Yes Take 1 tablet (300 mg total) by mouth daily. [place], [name], MD Taking Active   amLODipine (NORVASC) 10 MG tablet [id number] Yes Take 1 tablet (10 mg total) by mouth daily. [place], [name], MD Taking Active   aspirin 81 MG tablet [id number] Yes Take 81 mg by mouth every evening.  [provider] Taking Active Self, Pharmacy Records  diclofenac Sodium (VOLTAREN) 1 % GEL [id number] Yes Apply 2 g topically 4 (four) times daily. [place], [name], MD Taking Active   empagliflozin (JARDIANCE) 25 MG TABS tablet [id number] Yes Take 1 tablet (25 mg total) by mouth daily before breakfast. [place], [name], MD Taking Active            Med Note (GENTRY, CHERYL A  Thu Sep 02, 2023 10:39 AM) PAP  glimepiride (AMARYL) 2 MG tablet [id number] Yes Take 1 tablet (2 mg total) by mouth daily before breakfast. [place], [name], MD Taking Active   glucose blood (ACCU-CHEK GUIDE) test strip [id number] Yes TEST BLOOD SUGAR 1 TO 2 TIMES DAILY [place], [name], MD Taking Active   irbesartan (AVAPRO) 75 MG tablet [id number] Yes TAKE 1 TABLET BY MOUTH EVERY DAY [place], [name], MD  Taking Active   metoprolol succinate (TOPROL-XL) 50 MG 24 hr tablet [id number] Yes TAKE 1 TABLET EVERY DAY WITH A MEAL OR IMMEDIATELY FOLLOWING A MEAL [place], [name], MD Taking Active   potassium chloride SA (KLOR-CON M) 20 MEQ tablet [id number] Yes Take 1 tablet (20 mEq total) by mouth daily. [place], [name], MD Taking Active   rosuvastatin (CRESTOR) 20 MG tablet [id number] Yes TAKE 1 TABLET BY MOUTH EVERY DAY [place], [name], MD Taking Active   Semaglutide,0.25 or 0.5MG /DOS, 2 MG/3ML Namon Cirri [id number] Yes Inject 0.5 mg into the skin once a week. [provider] Taking Active            Med Note Littie Deeds, [name] Jun 06, 2023 12:41 PM) PAP  triamcinolone cream (KENALOG) 0.1 % [id number]  Apply 2 times daily, small amount on right hand for 2 weeks then 1-2 times per week. [place], [name], MD  Active               Assessment/Plan:   Diabetes: - Currently controlled - Reviewed long term cardiovascular and renal outcomes of uncontrolled blood sugar - Reviewed goal A1c, goal fasting, and goal 2 hour post prandial glucose - Recommend to continue Ozempic 1mg  once weekly (started two weeks ago) and hold Glimepiride 2mg   -Scheduled Dec f/u with PCP at patient request - Recommend to check glucose twice daily - Meets financial criteria for Jardiance/Ozempic patient assistance through [facility] for 2025 renewal. Patient has received renewal paperwork, pending his return.    Follow Up Plan:  2 weeks  Sherrill Raring, PharmD Clinical Pharmacist [phone]

## 2023-10-30 ENCOUNTER — Other Ambulatory Visit: Payer: Self-pay | Admitting: Family Medicine

## 2023-10-30 DIAGNOSIS — E1159 Type 2 diabetes mellitus with other circulatory complications: Secondary | ICD-10-CM

## 2023-11-06 ENCOUNTER — Other Ambulatory Visit: Payer: Self-pay | Admitting: Family Medicine

## 2023-11-06 DIAGNOSIS — E1159 Type 2 diabetes mellitus with other circulatory complications: Secondary | ICD-10-CM

## 2023-11-06 DIAGNOSIS — I10 Essential (primary) hypertension: Secondary | ICD-10-CM

## 2023-11-06 DIAGNOSIS — E118 Type 2 diabetes mellitus with unspecified complications: Secondary | ICD-10-CM

## 2023-11-08 ENCOUNTER — Other Ambulatory Visit: Payer: Self-pay | Admitting: Family Medicine

## 2023-11-08 DIAGNOSIS — E876 Hypokalemia: Secondary | ICD-10-CM

## 2023-11-10 ENCOUNTER — Other Ambulatory Visit: Payer: Medicare PPO

## 2023-11-10 DIAGNOSIS — E118 Type 2 diabetes mellitus with unspecified complications: Secondary | ICD-10-CM

## 2023-11-10 NOTE — Progress Notes (Signed)
11/10/2023 Name: Charles [name] MRN: [id number] DOB: 1951/05/14  Chief Complaint  Patient presents with   Diabetes   Medication Management   Hypertension    Charles [name] is a 72 y.o. year old male who presented for a telephone visit.   They were referred to the pharmacist by their PCP for assistance in managing diabetes.    Subjective:  Care Team: Primary Care Provider: [place], [name], MD ; Next Scheduled Visit: 11/24/23  Medication Access/Adherence  Current Pharmacy:  [facility] - [place], [place] - [address] [place] [address] Phone: [phone] Fax: [phone]  [id number] - [place], [place] - [address] AT CORNER OF [facility] [address] [place] [address] Phone: [phone] Fax: [phone]   Patient reports affordability concerns with their medications: No  Patient reports access/transportation concerns to their pharmacy: Yes-working with social work now to overcome Patient reports adherence concerns with their medications:  No     Diabetes:  Current medications: Ozempic 1mg  Jardiance 25mg  Medications tried in the past:   Current glucose readings: denies any lows since stopping the glimepiride, reporting sugars in the 120s-130s, occasional 160s post-meal Checking sugars twice daily, 1 fasting and 1 post-prandial   Patient denies hypoglycemic s/sx including dizziness, shakiness, sweating. Patient denies hyperglycemic symptoms including polyuria, polydipsia, polyphagia, nocturia, neuropathy, blurred vision.  Current medication access support: Jardiance 25mg  through [facility], Ozempic 1mg  through [facility]   Objective:  Lab Results  Component Value Date   HGBA1C 6.5 06/09/2023    Lab Results  Component Value Date   CREATININE 1.41 06/09/2023   BUN 14 06/09/2023   NA 138 06/09/2023   K 3.9 06/09/2023   CL 105 06/09/2023   CO2 23 06/09/2023    Lab  Results  Component Value Date   CHOL 112 09/04/2022   HDL 39.10 09/04/2022   LDLCALC 57 09/04/2022   TRIG 79.0 09/04/2022   CHOLHDL 3 09/04/2022    Medications Reviewed Today     Reviewed by Sherrill Raring, RPH (Pharmacist) on 11/10/23 at 0945  Med List Status: <None>   Medication Order Taking? Sig Documenting Provider Last Dose Status Informant  Accu-Chek FastClix Lancets MISC [id number]  TEST BLOOD SUGARS TWO TIMES DAILY. [place], [name], MD  Active   ACCU-CHEK GUIDE TEST test strip [id number]  TEST BLOOD SUGAR 1 TO 2 TIMES DAILY [place], [name], MD  Active   albuterol (VENTOLIN HFA) 108 (90 Base) MCG/ACT inhaler [id number]  Inhale 2 puffs into the lungs every 6 (six) hours as needed for wheezing or shortness of breath. [place], [name], MD  Active   Alcohol Swabs (B-D SINGLE USE SWABS REGULAR) PADS [id number]  1 Device by Does not apply route daily. [place], [name], MD  Active   allopurinol (ZYLOPRIM) 300 MG tablet [id number]  TAKE 1 TABLET EVERY DAY [place], [name], MD  Active   amLODipine (NORVASC) 10 MG tablet [id number]  TAKE 1 TABLET EVERY DAY [place], [name], MD  Active   aspirin 81 MG tablet [id number]  Take 81 mg by mouth every evening.  [provider]  Active Self, Pharmacy Records  diclofenac Sodium (VOLTAREN) 1 % GEL [id number]  Apply 2 g topically 4 (four) times daily. [place], [name], MD  Active   empagliflozin (JARDIANCE) 25 MG TABS tablet [id number]  Take 1 tablet (25 mg total) by mouth daily before breakfast. [place], [name], MD  Active  Med Note Littie Deeds, CHERYL A   Thu Sep 02, 2023 10:39 AM) PAP  glimepiride (AMARYL) 2 MG tablet [id number] No TAKE 1 TABLET EVERY DAY BEFORE BREAKFAST  Patient not taking: Reported on 11/10/2023   [place], [name], MD Not Taking Active            Med Note Sherrill Raring   Wed Nov 10, 2023  9:45 AM) Holding since 10/27/23 when ozempic increased to 1mg   irbesartan (AVAPRO) 75 MG tablet [id number]  TAKE 1 TABLET EVERY DAY  [place], [name], MD  Active   metoprolol succinate (TOPROL-XL) 50 MG 24 hr tablet [id number]  TAKE 1 TABLET EVERY DAY WITH A MEAL OR IMMEDIATELY FOLLOWING A MEAL [place], [name], MD  Active   potassium chloride SA Jerene Dilling) 20 MEQ tablet [id number]  TAKE 1 TABLET EVERY DAY [place], [name], MD  Active   rosuvastatin (CRESTOR) 20 MG tablet [id number]  TAKE 1 TABLET BY MOUTH EVERY DAY [place], [name], MD  Active   Semaglutide,0.25 or 0.5MG /DOS, 2 MG/3ML Namon Cirri [id number]  Inject 0.5 mg into the skin once a week. [provider]  Active            Med Note Leitha Bleak, Milas Kocher   Wed Nov 10, 2023  9:45 AM) 1mg  dose  triamcinolone cream (KENALOG) 0.1 % [id number]  Apply 2 times daily, small amount on right hand for 2 weeks then 1-2 times per week. [place], [name], MD  Active               Assessment/Plan:   Diabetes: - Currently controlled - Reviewed long term cardiovascular and renal outcomes of uncontrolled blood sugar - Reviewed goal A1c, goal fasting, and goal 2 hour post prandial glucose - Recommend to continue Ozempic 1mg  once weekly (started two weeks ago) and continue to hold Glimepiride 2mg   - Recommend to check glucose twice daily - Meets financial criteria for Jardiance/Ozempic patient assistance through [facility] for 2025 renewal. Patient has received renewal paperwork, pending his return. Coming to office 11/24/23 to see me prior to PCP for assistance filling out  The patients has a diagnosis of hypertension and it is medically necessary for them to have access to a home device to monitor blood pressure.  The patient does not have readily available insurance access to a device and cannot afford to purchase a device at this time.  The patient has been counseled that they do not need to continue to receive services from [facility] to receive a device.  The patient will be given a device free of charge.     Follow Up Plan:  2 weeks  Sherrill Raring, PharmD Clinical  Pharmacist [phone]

## 2023-11-11 ENCOUNTER — Ambulatory Visit: Payer: Self-pay

## 2023-11-11 NOTE — Patient Outreach (Signed)
  Care Coordination   Follow Up Visit Note   11/11/2023 Name: PARISH PEPITONE MRN: [id number] DOB: 1951-07-04  AKWASI KUPERUS is a 72 y.o. year old male who sees [place], Timoteo Expose, MD for primary care. I spoke with  [name] by phone today.  What matters to the patients health and wellness today?  Maintain health    Goals Addressed             This Visit's Progress    Health Management       Patient Goals/Self Care Activities: -Patient/Caregiver will self-administer medications as prescribed as evidenced by self-report/primary caregiver report  -Patient/Caregiver will attend all scheduled provider appointments as evidenced by clinician review of documented attendance to scheduled appointments and patient/caregiver report -Patient/Caregiver will call provider office for new concerns or questions as evidenced by review of documented incoming telephone call notes and patient report    Patient doing okay. Blood sugars are 125-130 per patient.  Reiterated diabetes management.  No concerns.          SDOH assessments and interventions completed:  Yes  SDOH Interventions Today    Flowsheet Row Most Recent Value  SDOH Interventions   Food Insecurity Interventions Intervention Not Indicated  Housing Interventions Intervention Not Indicated  Transportation Interventions Intervention Not Indicated  Utilities Interventions Intervention Not Indicated        Care Coordination Interventions:  Yes, provided   Follow up plan: Follow up call scheduled for January    Encounter Outcome:  Patient Visit Completed   [name], RN, MSN [place]  [facility], [facility] Management Community Coordinator Direct Dial: [phone]  Fax: [phone] Website: [email or web address]

## 2023-11-11 NOTE — Patient Instructions (Signed)
Visit Information  Thank you for taking time to visit with me today. Please don't hesitate to contact me if I can be of assistance to you.   Following are the goals we discussed today:   Goals Addressed             This Visit's Progress    Health Management       Patient Goals/Self Care Activities: -Patient/Caregiver will self-administer medications as prescribed as evidenced by self-report/primary caregiver report  -Patient/Caregiver will attend all scheduled provider appointments as evidenced by clinician review of documented attendance to scheduled appointments and patient/caregiver report -Patient/Caregiver will call provider office for new concerns or questions as evidenced by review of documented incoming telephone call notes and patient report    Patient doing okay. Blood sugars are 125-130 per patient.  Reiterated diabetes management.  No concerns.          Our next appointment is by telephone on 01/06/24 at 1030 am  Please call the care guide team at [phone] if you need to cancel or reschedule your appointment.   If you are experiencing a Mental Health or Behavioral Health Crisis or need someone to talk to, please call the Suicide and Crisis Lifeline: 988   Patient verbalizes understanding of instructions and care plan provided today and agrees to view in MyChart. Active MyChart status and patient understanding of how to access instructions and care plan via MyChart confirmed with patient.     The patient has been provided with contact information for the care management team and has been advised to call with any health related questions or concerns.   [name], RN, MSN [facility], [facility] Management Community Coordinator Direct Dial: [phone]  Fax: [phone] Website: [email or web address]

## 2023-11-24 ENCOUNTER — Ambulatory Visit (INDEPENDENT_AMBULATORY_CARE_PROVIDER_SITE_OTHER): Payer: Medicare PPO | Admitting: Family Medicine

## 2023-11-24 ENCOUNTER — Ambulatory Visit: Payer: Medicare PPO

## 2023-11-24 VITALS — BP 138/82 | HR 97 | Resp 16 | Ht 65.5 in | Wt 253.0 lb

## 2023-11-24 DIAGNOSIS — N1831 Chronic kidney disease, stage 3a: Secondary | ICD-10-CM | POA: Diagnosis not present

## 2023-11-24 DIAGNOSIS — Z7985 Long-term (current) use of injectable non-insulin antidiabetic drugs: Secondary | ICD-10-CM | POA: Diagnosis not present

## 2023-11-24 DIAGNOSIS — E785 Hyperlipidemia, unspecified: Secondary | ICD-10-CM | POA: Diagnosis not present

## 2023-11-24 DIAGNOSIS — E1159 Type 2 diabetes mellitus with other circulatory complications: Secondary | ICD-10-CM | POA: Diagnosis not present

## 2023-11-24 DIAGNOSIS — Z6841 Body Mass Index (BMI) 40.0 and over, adult: Secondary | ICD-10-CM | POA: Diagnosis not present

## 2023-11-24 DIAGNOSIS — I10 Essential (primary) hypertension: Secondary | ICD-10-CM

## 2023-11-24 DIAGNOSIS — Z7984 Long term (current) use of oral hypoglycemic drugs: Secondary | ICD-10-CM | POA: Diagnosis not present

## 2023-11-24 LAB — LIPID PANEL
Cholesterol: 121 mg/dL (ref 0–200)
HDL: 39.8 mg/dL (ref >39.00)
LDL Cholesterol: 55 mg/dL (ref 0–99)
NonHDL: 81.54
Total CHOL/HDL Ratio: 3
Triglycerides: 132 mg/dL (ref 0.0–149.0)
VLDL: 26.4 mg/dL (ref 0.0–40.0)

## 2023-11-24 LAB — POCT GLYCOSYLATED HEMOGLOBIN (HGB A1C): HbA1c, POC (prediabetic range): 6.3 % (ref 5.7–6.4)

## 2023-11-24 LAB — MICROALBUMIN / CREATININE URINE RATIO
Creatinine,U: 107.4 mg/dL
Microalb Creat Ratio: 0.9 mg/g (ref 0.0–30.0)
Microalb, Ur: 0.9 mg/dL (ref 0.0–1.9)

## 2023-11-24 LAB — COMPREHENSIVE METABOLIC PANEL
ALT: 27 U/L (ref 0–53)
AST: 26 U/L (ref 0–37)
Albumin: 4.4 g/dL (ref 3.5–5.2)
Alkaline Phosphatase: 117 U/L (ref 39–117)
BUN: 19 mg/dL (ref 6–23)
CO2: 26 meq/L (ref 19–32)
Calcium: 9.8 mg/dL (ref 8.4–10.5)
Chloride: 106 meq/L (ref 96–112)
Creatinine, Ser: 1.32 mg/dL (ref 0.40–1.50)
GFR: 54.12 mL/min — ABNORMAL LOW (ref >60.00)
Glucose, Bld: 95 mg/dL (ref 70–99)
Potassium: 3.9 meq/L (ref 3.5–5.1)
Sodium: 141 meq/L (ref 135–145)
Total Bilirubin: 0.5 mg/dL (ref 0.2–1.2)
Total Protein: 7.9 g/dL (ref 6.0–8.3)

## 2023-11-24 LAB — VITAMIN D 25 HYDROXY (VIT D DEFICIENCY, FRACTURES): VITD: 36.87 ng/mL (ref 30.00–100.00)

## 2023-11-24 NOTE — Patient Instructions (Addendum)
A few things to remember from today's visit:  Type 2 diabetes mellitus with vascular disease (HCC) - Plan: POC HgB A1c, Microalbumin / creatinine urine ratio  Essential hypertension - Plan: Comprehensive metabolic panel  Hyperlipidemia, unspecified hyperlipidemia type - Plan: Comprehensive metabolic panel, Lipid panel  Stage 3a chronic kidney disease (HCC) - Plan: Comprehensive metabolic panel, Microalbumin / creatinine urine ratio, VITAMIN D 25 Hydroxy (Vit-D Deficiency, Fractures) Stop Glimepiride. Monitor blood pressure morning and night as instructed and let me know about readings in 2 weeks.  If you need refills for medications you take chronically, please call your pharmacy. Do not use My Chart to request refills or for acute issues that need immediate attention. If you send a my chart message, it may take a few days to be addressed, specially if I am not in the office.  Please be sure medication list is accurate. If a new problem present, please set up appointment sooner than planned today.

## 2023-11-24 NOTE — Progress Notes (Signed)
HPI: Mr.Falon R [name] is a 72 y.o. male with a PMHx significant for atherosclerosis of aorta, HTN, DM II, asthma, GERD, HLD, gout, and chronic pain, among others, who is here today for chronic disease management.  Last seen on 06/09/2023 No new problems sine his last visit.  Diabetes Mellitus II: Dx'ed around 2011  - Checking BG at home: He has been checking at home and his readings have been 110s-120s, with a low of 90.  - Medications: Currently on glimepiride 1 mg daily, Ozempic 1 mg weekly, and Jardiance 25 mg daily.  - Diet: Generally eating healthier, initially lost wt with Ozempic but mentions he has gained some weight recently.  - Exercise: He has been taking walks for about 20 minutes a few times per week for the past 3 weeks. - eye exam: He hasn't had an eye appointment this year. He would like to switch providers.  - foot exam: 11/2022. - Negative for symptoms of hypoglycemia, polyuria, polydipsia, numbness extremities, foot ulcers/trauma  Lab Results  Component Value Date   HGBA1C 6.5 06/09/2023   Lab Results  Component Value Date   MICROALBUR 0.8 12/16/2022   Hypertension:  He is on Amlodipine 10 mg daily, Irbesartan 75 mg daily, Metoprolol Succinate 50 mg daily.  BP readings at home: He has not had a monitor at home until now, our pharmacy was able to provide one for his to take home.  He hasn't seen his cardiologist for awhile.  Negative for unusual or severe headache, visual changes, exertional chest pain, dyspnea,  focal weakness, or edema.  Renal function mildly abnormal for the past year but stable. Negative for gross hematuria, foam in urine,or decreased urine output.  Lab Results  Component Value Date   NA 138 06/09/2023   CL 105 06/09/2023   K 3.9 06/09/2023   CO2 23 06/09/2023   BUN 14 06/09/2023   CREATININE 1.41 06/09/2023   GFR 50.16 (L) 06/09/2023   CALCIUM 9.8 06/09/2023   ALBUMIN 3.8 05/25/2022   GLUCOSE 84 06/09/2023   Hyperlipidemia:   Currently on rosuvastatin 20 mg daily.  Lab Results  Component Value Date   CHOL 112 09/04/2022   HDL 39.10 09/04/2022   LDLCALC 57 09/04/2022   TRIG 79.0 09/04/2022   CHOLHDL 3 09/04/2022   Review of Systems  Constitutional:  Negative for activity change, appetite change, chills and fever.  HENT:  Negative for mouth sores, nosebleeds and sore throat.   Respiratory:  Negative for cough and wheezing.   Gastrointestinal:  Negative for abdominal pain, nausea and vomiting.  Endocrine: Negative for cold intolerance and heat intolerance.  Skin:  Negative for rash.  Neurological:  Negative for syncope and facial asymmetry.  Psychiatric/Behavioral:  Negative for confusion and hallucinations.   See other pertinent positives and negatives in HPI.  Current Outpatient Medications on File Prior to Visit  Medication Sig Dispense Refill   Accu-Chek FastClix Lancets MISC TEST BLOOD SUGARS TWO TIMES DAILY. 204 each 3   ACCU-CHEK GUIDE TEST test strip TEST BLOOD SUGAR 1 TO 2 TIMES DAILY 200 strip 3   albuterol (VENTOLIN HFA) 108 (90 Base) MCG/ACT inhaler Inhale 2 puffs into the lungs every 6 (six) hours as needed for wheezing or shortness of breath. 8 g 1   Alcohol Swabs (B-D SINGLE USE SWABS REGULAR) PADS 1 Device by Does not apply route daily. 90 each 3   allopurinol (ZYLOPRIM) 300 MG tablet TAKE 1 TABLET EVERY DAY 90 tablet 3   amLODipine (  NORVASC) 10 MG tablet TAKE 1 TABLET EVERY DAY 90 tablet 3   aspirin 81 MG tablet Take 81 mg by mouth every evening.      diclofenac Sodium (VOLTAREN) 1 % GEL Apply 2 g topically 4 (four) times daily. 150 g 3   empagliflozin (JARDIANCE) 25 MG TABS tablet Take 1 tablet (25 mg total) by mouth daily before breakfast. 30 tablet 3   glimepiride (AMARYL) 2 MG tablet TAKE 1 TABLET EVERY DAY BEFORE BREAKFAST (Patient not taking: Reported on 11/10/2023) 90 tablet 3   irbesartan (AVAPRO) 75 MG tablet TAKE 1 TABLET EVERY DAY 90 tablet 3   metoprolol succinate (TOPROL-XL)  50 MG 24 hr tablet TAKE 1 TABLET EVERY DAY WITH A MEAL OR IMMEDIATELY FOLLOWING A MEAL 90 tablet 3   potassium chloride SA (KLOR-CON M) 20 MEQ tablet TAKE 1 TABLET EVERY DAY 90 tablet 3   rosuvastatin (CRESTOR) 20 MG tablet TAKE 1 TABLET BY MOUTH EVERY DAY 30 tablet 0   Semaglutide,0.25 or 0.5MG /DOS, 2 MG/3ML SOPN Inject 0.5 mg into the skin once a week.     triamcinolone cream (KENALOG) 0.1 % Apply 2 times daily, small amount on right hand for 2 weeks then 1-2 times per week. 30 g 2   No current facility-administered medications on file prior to visit.   Past Medical History:  Diagnosis Date   Abnormal carotid ultrasound 07/2015   mild bilateral stenosis, repeat in 1 year.  Dr. Rennis Golden   Diabetes mellitus without complication (HCC)    Gout    Hepatitis C antibody test positive 05/2015   prior infection, resolved   History of cardiovascular stress test 9/16   Lexiscan, EF 55%, no ischemia, Dr. Rennis Golden   Hyperlipidemia    Hypertension    Noncompliance    Obesity    PVD (peripheral vascular disease) (HCC) 07/2015   normal ABIs, Dr. Rennis Golden   Allergies  Allergen Reactions   Penicillins Other (See Comments)    Per pt blacked out after injection as a child.  Did it involve swelling of the face/tongue/throat, SOB, or low BP? N Did it involve sudden or severe rash/hives, skin peeling, or any reaction on the inside of your mouth or nose? N Did you need to seek medical attention at a hospital or doctor's office? N When did it last happen?   Several Decades Ago    If all above answers are "NO", may proceed with cephalosporin use.     Social History   Socioeconomic History   Marital status: Divorced    Spouse name: Not on file   Number of children: Not on file   Years of education: Not on file   Highest education level: 10th grade  Occupational History   Not on file  Tobacco Use   Smoking status: Former    Current packs/day: 0.00    Average packs/day: 1 pack/day for 30.0 years (30.0 ttl  pk-yrs)    Types: Cigarettes    Start date: 07/30/1965    Quit date: 07/31/1995    Years since quitting: 28.3   Smokeless tobacco: Never  Vaping Use   Vaping status: Never Used  Substance and Sexual Activity   Alcohol use: No    Alcohol/week: 0.0 standard drinks of alcohol   Drug use: No   Sexual activity: Not on file  Other Topics Concern   Not on file  Social History Narrative   Not on file   Social Determinants of Health   Financial Resource Strain:  Medium Risk (11/24/2023)   Overall Financial Resource Strain (CARDIA)    Difficulty of Paying Living Expenses: Somewhat hard  Food Insecurity: Food Insecurity Present (11/24/2023)   Hunger Vital Sign    Worried About Running Out of Food in the Last Year: Often true    Ran Out of Food in the Last Year: Sometimes true  Transportation Needs: Unmet Transportation Needs (11/24/2023)   PRAPARE - Administrator, Civil Service (Medical): Yes    Lack of Transportation (Non-Medical): Yes  Physical Activity: Insufficiently Active (11/24/2023)   Exercise Vital Sign    Days of Exercise per Week: 5 days    Minutes of Exercise per Session: 20 min  Stress: Stress Concern Present (11/24/2023)   [employer] of Occupational Health - Occupational Stress Questionnaire    Feeling of Stress : To some extent  Social Connections: Socially Isolated (11/24/2023)   Social Connection and Isolation Panel [NHANES]    Frequency of Communication with Friends and Family: More than three times a week    Frequency of Social Gatherings with Friends and Family: Never    Attends Religious Services: Never    Database administrator or Organizations: No    Attends Engineer, structural: Not on file    Marital Status: Divorced   Today's Vitals   11/24/23 0900  BP: 138/82  Pulse: 97  Resp: 16  SpO2: 100%  Weight: 253 lb (114.8 kg)  Height: 5' 5.5" (1.664 m)   Wt Readings from Last 3 Encounters:  11/24/23 253 lb (114.8 kg)  08/05/23 243  lb (110.2 kg)  06/09/23 249 lb 2 oz (113 kg)   Body mass index is 41.46 kg/m.  Physical Exam Vitals and nursing note reviewed.  Constitutional:      General: He is not in acute distress.    Appearance: He is well-developed.  HENT:     Head: Normocephalic and atraumatic.     Mouth/Throat:     Pharynx: Uvula midline.  Eyes:     Conjunctiva/sclera: Conjunctivae normal.  Cardiovascular:     Rate and Rhythm: Normal rate and regular rhythm.     Heart sounds: No murmur heard.    Comments: DP pulses palapable. Pulmonary:     Effort: Pulmonary effort is normal. No respiratory distress.     Breath sounds: Normal breath sounds.  Abdominal:     Palpations: Abdomen is soft. There is no hepatomegaly or mass.     Tenderness: There is no abdominal tenderness.  Musculoskeletal:     Right lower leg: No edema.     Left lower leg: No edema.  Skin:    General: Skin is warm.     Findings: No erythema or rash.  Neurological:     Mental Status: He is alert and oriented to person, place, and time.     Cranial Nerves: No cranial nerve deficit.     Gait: Gait normal.  Psychiatric:        Mood and Affect: Mood and affect normal.    ASSESSMENT AND PLAN:  Mr. Degnan was seen today for chronic disease management.  Lab Results  Component Value Date   HGBA1C 6.3 11/24/2023   Lab Results  Component Value Date   MICROALBUR 0.9 11/24/2023   MICROALBUR 0.8 12/16/2022   Lab Results  Component Value Date   NA 141 11/24/2023   CL 106 11/24/2023   K 3.9 11/24/2023   CO2 26 11/24/2023   BUN 19 11/24/2023   CREATININE  1.32 11/24/2023   GFR 54.12 (L) 11/24/2023   CALCIUM 9.8 11/24/2023   ALBUMIN 4.4 11/24/2023   GLUCOSE 95 11/24/2023   Lab Results  Component Value Date   ALT 27 11/24/2023   AST 26 11/24/2023   ALKPHOS 117 11/24/2023   BILITOT 0.5 11/24/2023   Lab Results  Component Value Date   CHOL 121 11/24/2023   HDL 39.80 11/24/2023   LDLCALC 55 11/24/2023   TRIG 132.0  11/24/2023   CHOLHDL 3 11/24/2023   Type 2 diabetes mellitus with vascular disease (HCC) Assessment & Plan: Hemoglobin A1c today 6.3 (6.5). Recommend discontinue glimepiride. Continue Ozempic 1 mg weekly and Jardiance 25 mg daily. Continue appropriate footcare. He is due for eye exam. Continue monitoring BS regularly. Follow-up in 3 to 4 months.  Orders: -     POCT glycosylated hemoglobin (Hb A1C) -     Microalbumin / creatinine urine ratio; Future  Essential hypertension Assessment & Plan: BP mildly elevated today. Today he received a blood pressure monitor, instructed to monitor BP am and pm and to let me know about readings in 2 weeks. Continue amlodipine 10 mg daily, irbesartan 75 mg daily, and metoprolol succinate 50 mg daily. Continue low-salt diet. He is due for eye exam.  Orders: -     Comprehensive metabolic panel; Future  Hyperlipidemia, unspecified hyperlipidemia type Assessment & Plan: Last LDL 57 in 08/2022. Continue rosuvastatin 20 mg daily and low-fat diet. Further recommendations according to FLP results.  Orders: -     Comprehensive metabolic panel; Future -     Lipid panel; Future  Stage 3a chronic kidney disease (HCC) Assessment & Plan: Since 11/2022 e GFR high 40's and low 50's. We discussed Dx, prognosis,and and the importance of good BP and glucose controlled. Continue low salt diet and adequate hydration. Avoid NSAID's. He is on Avapro and [place].  Orders: -     Comprehensive metabolic panel; Future -     Microalbumin / creatinine urine ratio; Future -     VITAMIN D 25 Hydroxy (Vit-D Deficiency, Fractures); Future  Morbid obesity with BMI of 40.0-44.9, adult [facility]) Assessment & Plan: He understands the benefits of wt loss as well as adverse effects of obesity. Consistency with healthy diet and physical activity encouraged.    Return in about 18 weeks (around 03/29/2024) for chronic problems.   I, Suanne Marker, acting as a scribe  for Charles Bear [place], MD., have documented all relevant documentation on the behalf of Charles Court [place], MD, as directed by  Yedidya Duddy [place], MD while in the presence of Krystalle Pilkington [place], MD.   I, Suanne Marker, have reviewed all documentation for this visit. The documentation on 11/24/23 for the exam, diagnosis, procedures, and orders are all accurate and complete.  Semaje Kinker G. [place], MD  [facility]. [place] office.

## 2023-11-24 NOTE — Progress Notes (Signed)
   11/24/2023  Patient ID: Charles [name], male   DOB: [birth year], 72 y.o.   MRN: [id number]  Patient came in to office today for assistance with filling out 2025 renewal applications for Jardiance ([employer]) and Ozempic [employer]). Forms completed and submitted to companies, pending response.  Sherrill Raring, PharmD Clinical Pharmacist [phone]

## 2023-11-24 NOTE — Assessment & Plan Note (Signed)
Hemoglobin A1c today 6.3 (6.5). Recommend discontinue glimepiride. Continue Ozempic 1 mg weekly and Jardiance 25 mg daily. Continue appropriate footcare. He is due for eye exam. Continue monitoring BS regularly. Follow-up in 3 to 4 months.

## 2023-11-27 NOTE — Assessment & Plan Note (Signed)
Last LDL 57 in 08/2022. Continue rosuvastatin 20 mg daily and low-fat diet. Further recommendations according to FLP results.

## 2023-11-27 NOTE — Assessment & Plan Note (Signed)
BP mildly elevated today. Today he received a blood pressure monitor, instructed to monitor BP am and pm and to let me know about readings in 2 weeks. Continue amlodipine 10 mg daily, irbesartan 75 mg daily, and metoprolol succinate 50 mg daily. Continue low-salt diet. He is due for eye exam.

## 2023-11-27 NOTE — Assessment & Plan Note (Signed)
He understands the benefits of wt loss as well as adverse effects of obesity. Consistency with healthy diet and physical activity encouraged.

## 2023-11-27 NOTE — Assessment & Plan Note (Signed)
Since 11/2022 e GFR high 40's and low 50's. We discussed Dx, prognosis,and and the importance of good BP and glucose controlled. Continue low salt diet and adequate hydration. Avoid NSAID's. He is on Avapro and [place].

## 2023-12-18 ENCOUNTER — Other Ambulatory Visit: Payer: Self-pay | Admitting: Family Medicine

## 2023-12-18 DIAGNOSIS — E118 Type 2 diabetes mellitus with unspecified complications: Secondary | ICD-10-CM

## 2023-12-20 ENCOUNTER — Other Ambulatory Visit: Payer: Medicare PPO

## 2024-01-06 ENCOUNTER — Ambulatory Visit: Payer: Self-pay

## 2024-01-06 NOTE — Patient Outreach (Signed)
  Care Coordination   Follow Up Visit Note   01/06/2024 Name: Charles [name] MRN: [id number] DOB: [birth year]  Charles [name] is a 73 y.o. year old male who sees Charles [name], Charles Expose, MD for primary care. I spoke with  Charles [name] by phone today.  What matters to the patients health and wellness today?  Maintain health    Goals Addressed             This Visit's Progress    Health Management       Patient Goals/Self Care Activities: -Patient/Caregiver will self-administer medications as prescribed as evidenced by self-report/primary caregiver report  -Patient/Caregiver will attend all scheduled provider appointments as evidenced by clinician review of documented attendance to scheduled appointments and patient/caregiver report -Patient/Caregiver will call provider office for new concerns or questions as evidenced by review of documented incoming telephone call notes and patient report    Patient doing okay. Blood sugar 119 this morning.  Reviewed diabetes management.  No concerns.          SDOH assessments and interventions completed:  Yes     Care Coordination Interventions:  Yes, provided   Follow up plan: Follow up call scheduled for February    Encounter Outcome:  Patient Visit Completed   Charles Leriche, RN, MSN [place]  [facility], [facility] Health RN Care Manager Direct Dial: [phone]  Fax: [phone] Website: [email or web address]

## 2024-01-06 NOTE — Patient Instructions (Signed)
Visit Information  Thank you for taking time to visit with me today. Please don't hesitate to contact me if I can be of assistance to you.   Following are the goals we discussed today:   Goals Addressed             This Visit's Progress    Health Management       Patient Goals/Self Care Activities: -Patient/Caregiver will self-administer medications as prescribed as evidenced by self-report/primary caregiver report  -Patient/Caregiver will attend all scheduled provider appointments as evidenced by clinician review of documented attendance to scheduled appointments and patient/caregiver report -Patient/Caregiver will call provider office for new concerns or questions as evidenced by review of documented incoming telephone call notes and patient report    Patient doing okay. Blood sugar 119 this morning.  Reviewed diabetes management.  No concerns.          Our next appointment is by telephone on 03/02/24 at 1030 am  Please call the care guide team at [phone] if you need to cancel or reschedule your appointment.   If you are experiencing a Mental Health or Behavioral Health Crisis or need someone to talk to, please call the Suicide and Crisis Lifeline: 988   Patient verbalizes understanding of instructions and care plan provided today and agrees to view in MyChart. Active MyChart status and patient understanding of how to access instructions and care plan via MyChart confirmed with patient.     The patient has been provided with contact information for the care management team and has been advised to call with any health related questions or concerns.   [name], RN, MSN [facility], [facility] Health RN Care Manager Direct Dial: [phone]  Fax: [phone] Website: [email or web address]

## 2024-01-10 DIAGNOSIS — H35013 Changes in retinal vascular appearance, bilateral: Secondary | ICD-10-CM | POA: Diagnosis not present

## 2024-01-10 DIAGNOSIS — H25813 Combined forms of age-related cataract, bilateral: Secondary | ICD-10-CM | POA: Diagnosis not present

## 2024-01-10 DIAGNOSIS — E119 Type 2 diabetes mellitus without complications: Secondary | ICD-10-CM | POA: Diagnosis not present

## 2024-01-10 DIAGNOSIS — D23111 Other benign neoplasm of skin of right upper eyelid, including canthus: Secondary | ICD-10-CM | POA: Diagnosis not present

## 2024-01-20 ENCOUNTER — Other Ambulatory Visit: Payer: Self-pay | Admitting: Family Medicine

## 2024-01-20 DIAGNOSIS — E785 Hyperlipidemia, unspecified: Secondary | ICD-10-CM

## 2024-01-20 DIAGNOSIS — I10 Essential (primary) hypertension: Secondary | ICD-10-CM

## 2024-02-28 ENCOUNTER — Telehealth: Payer: Self-pay

## 2024-02-28 MED ORDER — SEMAGLUTIDE (1 MG/DOSE) 4 MG/3ML ~~LOC~~ SOPN: 1.0000 mg | PEN_INJECTOR | SUBCUTANEOUS | 0 refills | Status: AC

## 2024-02-28 NOTE — Progress Notes (Signed)
   02/28/2024  Patient ID: Charles [name], male   DOB: 10/05/1951, 73 y.o.   MRN: [id number]  Received phone call from patient stating that he was about to be out of his Ozempic 1mg  that he normally gets through [employer]. Company is experiencing shipping delays.  Contacted company and secured 30DS voucher for patient. Info listed below for reference. BIN: [id number] PCN: CNRX ID: [id number] GROUP: [id number]  Sent in 1 month order to [facility] at patient request. This replaces 1 month refill from company that PCP previously authorized. Patient instructed to call if any concerns with picking up.  Scheduled follow up call for next week.  Sherrill Raring, PharmD Clinical Pharmacist [phone]

## 2024-02-28 NOTE — Telephone Encounter (Signed)
 Copied from CRM [id number]. Topic: General - Other >> Feb 28, 2024  9:30 AM Elizebeth Brooking wrote: Reason for CRM: Patient called in stating he needs to speak to the pharmacist about one of his medication try to call the line but keeps receiving a busy signal. Is requesting a callback regarding this at  [phone]

## 2024-02-29 ENCOUNTER — Telehealth: Payer: Self-pay

## 2024-02-29 NOTE — Telephone Encounter (Signed)
 Pt's PAP for Ozempic came in. I called pt and let him know, he'll arrange to come pick it up.

## 2024-03-02 ENCOUNTER — Ambulatory Visit: Payer: Self-pay

## 2024-03-02 NOTE — Patient Outreach (Signed)
 Care Coordination   Follow Up Visit Note   03/02/2024 Name: Charles [name] MRN: [id number] DOB: Oct 10, 1951  Charles [name] is a 73 y.o. year old male who sees Charles [name], Charles Expose, MD for primary care. I spoke with  Charles [name] by phone today.  What matters to the patients health and wellness today?  Prepping for cataract surgery.      Goals Addressed             This Visit's Progress    Health Management-HTN, DM       Patient Goals/Self Care Activities: -Patient/Caregiver will self-administer medications as prescribed as evidenced by self-report/primary caregiver report  -Patient/Caregiver will attend all scheduled provider appointments as evidenced by clinician review of documented attendance to scheduled appointments and patient/caregiver report -Patient/Caregiver will call provider office for new concerns or questions as evidenced by review of documented incoming telephone call notes and patient report    Patient doing okay. Blood sugar 176 after breakfast this morning.  Blood pressure reading most recent 139/88.  Reiterated HTN and diabetes management.  Getting ready for possible cataract surgery.  No concerns.          SDOH assessments and interventions completed:  Yes  SDOH Interventions Today    Flowsheet Row Most Recent Value  SDOH Interventions   Food Insecurity Interventions Intervention Not Indicated  Transportation Interventions Intervention Not Indicated        Care Coordination Interventions:  Yes, provided   Follow up plan: Follow up call scheduled for May    Encounter Outcome:  Patient Visit Completed   [name] RN, MSN [facility], [facility] Health RN Care Manager Direct Dial: [phone]  Fax: [phone] Website: [email or web address]

## 2024-03-02 NOTE — Patient Instructions (Signed)
 Visit Information  Thank you for taking time to visit with me today. Please don't hesitate to contact me if I can be of assistance to you.   Following are the goals we discussed today:   Goals Addressed             This Visit's Progress    Health Management-HTN, DM       Patient Goals/Self Care Activities: -Patient/Caregiver will self-administer medications as prescribed as evidenced by self-report/primary caregiver report  -Patient/Caregiver will attend all scheduled provider appointments as evidenced by clinician review of documented attendance to scheduled appointments and patient/caregiver report -Patient/Caregiver will call provider office for new concerns or questions as evidenced by review of documented incoming telephone call notes and patient report    Patient doing okay. Blood sugar 176 after breakfast this morning.  Blood pressure reading most recent 139/88.  Reiterated HTN and diabetes management.  Getting ready for possible cataract surgery.  No concerns.          Our next appointment is by telephone on 05/04/24 at 1045 am  Please call the care guide team at [phone] if you need to cancel or reschedule your appointment.   If you are experiencing a Mental Health or Behavioral Health Crisis or need someone to talk to, please call the Suicide and Crisis Lifeline: 988   Patient verbalizes understanding of instructions and care plan provided today and agrees to view in MyChart. Active MyChart status and patient understanding of how to access instructions and care plan via MyChart confirmed with patient.     The patient has been provided with contact information for the care management team and has been advised to call with any health related questions or concerns.   [name] RN, MSN [facility], [facility] RN Care Manager Direct Dial: [phone]  Fax: [phone] Website: [email or web address]

## 2024-03-06 ENCOUNTER — Other Ambulatory Visit

## 2024-03-06 DIAGNOSIS — I10 Essential (primary) hypertension: Secondary | ICD-10-CM

## 2024-03-06 DIAGNOSIS — E1159 Type 2 diabetes mellitus with other circulatory complications: Secondary | ICD-10-CM

## 2024-03-06 NOTE — Progress Notes (Signed)
 03/06/2024 Name: Charles [name] MRN: [id number] DOB: 07/25/1951  Chief Complaint  Patient presents with   Diabetes   Hypertension    Charles [name] is a 73 y.o. year old male who presented for a telephone visit.   They were referred to the pharmacist by their PCP for assistance in managing diabetes.    Subjective:  Care Team: Primary Care Provider: [place], [name], MD ; Next Scheduled Visit: 11/24/23  Medication Access/Adherence  Current Pharmacy:  [facility] - [place], [place] - [address] [place] [address] Phone: [phone] Fax: [phone]  [id number] - [place], [place] - [address] AT CORNER OF [facility] [address] [place] [address] Phone: [phone] Fax: [phone]   Patient reports affordability concerns with their medications: No  Patient reports access/transportation concerns to their pharmacy: Yes-working with social work now to overcome Patient reports adherence concerns with their medications:  No    Patient inquires about refills for jardiance  Diabetes: Current medications: Ozempic 1mg , Jardiance 25mg  Medications tried in the past: Glimepiride 2mg  discontinued 11/24/23  Current glucose readings: avg 90s for fasting morning Bgs, has had 1 low BG in the 70s (3/16) while sleeping and had a piece of hard candy afterwards - BG was 132 this morning before breakfast, attributed to late dinner (contained potatoes)  Checking sugars twice daily, 1 fasting and 1 post-prandial  Patient reports  hypoglycemic s/sx including dizziness, shakiness, sweating with low sugars (1 occurrence 3/16 - 75) Patient denies hyperglycemic symptoms including polyuria, polydipsia, polyphagia, nocturia, neuropathy, blurred vision.  Current medication access support: Jardiance 25mg  through [facility], Ozempic 1mg  through [facility]   Hypertension:  Current medications: amlodipine 10mg ,  irbesartan 75mg   Medications previously tried: azilsartan-chlorthalidone, lisinopril, lisinopril-hydrochlorothiazide   Patient has a validated, automated, upper arm home BP cuff Current blood pressure readings readings: 3/17 am 162/91, recheck at 7:48 am was 139/86 - patient reports usually re-checks BP at least 3 times, a few minutes apart  3/11: 11:37am 139/88 rechecks --> 134/86, 141/89  Patient denies hypotensive s/sx including dizziness, lightheadedness.  Patient denies hypertensive symptoms including headache, chest pain, shortness of breath   Objective:  Lab Results  Component Value Date   HGBA1C 6.3 11/24/2023    Lab Results  Component Value Date   CREATININE 1.32 11/24/2023   BUN 19 11/24/2023   NA 141 11/24/2023   K 3.9 11/24/2023   CL 106 11/24/2023   CO2 26 11/24/2023    Lab Results  Component Value Date   CHOL 121 11/24/2023   HDL 39.80 11/24/2023   LDLCALC 55 11/24/2023   TRIG 132.0 11/24/2023   CHOLHDL 3 11/24/2023    Medications Reviewed Today     Reviewed by Verdene Rio, RPH (Pharmacist) on 03/06/24 at 1105  Med List Status: <None>   Medication Order Taking? Sig Documenting Provider Last Dose Status Informant  Accu-Chek FastClix Lancets MISC [id number]  TEST BLOOD SUGARS TWO TIMES DAILY. [place], [name], MD  Active   ACCU-CHEK GUIDE TEST test strip [id number]  TEST BLOOD SUGAR 1 TO 2 TIMES DAILY [place], [name], MD  Active   albuterol (VENTOLIN HFA) 108 (90 Base) MCG/ACT inhaler [id number]  Inhale 2 puffs into the lungs every 6 (six) hours as needed for wheezing or shortness of breath. [place], [name], MD  Active   Alcohol Swabs (B-D SINGLE USE SWABS REGULAR) PADS [id number]  1 Device by Does not apply route daily. [place],  Timoteo Expose, MD  Active   allopurinol (ZYLOPRIM) 300 MG tablet [id number] Yes TAKE 1 TABLET EVERY DAY [place], [name], MD Taking Active   amLODipine (NORVASC) 10 MG tablet [id number] Yes TAKE 1 TABLET EVERY DAY [place], [name], MD Taking  Active   aspirin 81 MG tablet [id number]  Take 81 mg by mouth every evening.  [provider]  Active Self, Pharmacy Records  diclofenac Sodium (VOLTAREN) 1 % GEL [id number] Yes Apply 2 g topically 4 (four) times daily. [place], [name], MD Taking Active   empagliflozin (JARDIANCE) 25 MG TABS tablet [id number] Yes Take 1 tablet (25 mg total) by mouth daily before breakfast. [place], [name], MD Taking Active            Med Note Nanci Pina Sep 02, 2023 10:39 AM) PAP  irbesartan (AVAPRO) 75 MG tablet [id number] Yes TAKE 1 TABLET EVERY DAY [place], [name], MD Taking Active   metoprolol succinate (TOPROL-XL) 50 MG 24 hr tablet [id number] Yes TAKE 1 TABLET EVERY DAY WITH A MEAL OR IMMEDIATELY FOLLOWING A MEAL [place], [name], MD Taking Active   potassium chloride SA Jerene Dilling) 20 MEQ tablet [id number] Yes TAKE 1 TABLET EVERY DAY [place], [name], MD Taking Active   rosuvastatin (CRESTOR) 20 MG tablet [id number] Yes TAKE 1 TABLET EVERY DAY [place], [name], MD Taking Active   Semaglutide, 1 MG/DOSE, 4 MG/3ML SOPN [id number] Yes Inject 1 mg as directed once a week. Use voucher do not bill insurance BIN [id number] PCN CNRX ID [id number] Group [id number] [place], [name], MD Taking Active   triamcinolone cream (KENALOG) 0.1 % [id number]  Apply 2 times daily, small amount on right hand for 2 weeks then 1-2 times per week. [place], [name], MD  Active               Assessment/Plan:   Diabetes: - Currently controlled, A1c goal <7% - Reviewed long term cardiovascular and renal outcomes of uncontrolled blood sugar - Reviewed goal A1c, goal fasting, and goal 2 hour post prandial glucose - Recommend to continue with current therapy - Recommend to check glucose twice daily - Patient has obtained Jardiance and ozempic through patient assistance and was instructed to call pharmacy on pill bottle to request refills as he should have some on file. Patient to get A1c labs on next IM appt on  4/9   Hypertension: - Currently controlled, BP goal <140/90 - Reviewed long term cardiovascular and renal outcomes of uncontrolled blood pressure - Reviewed appropriate blood pressure monitoring technique and reviewed goal blood pressure. Recommended to check home blood pressure and heart rate twice daily - Counseled on variability of BP readings if checked within 2-3 minutes apart and instructed to wait at least 10 minutes before re-check (given isolated high BP reading this morning after checking ~5 min prior with asymptomatic, likely false elevation) - Recommend to continue with current therapy    Follow Up Plan: 03/29/24  Sherrill Raring, PharmD Clinical Pharmacist [phone]  Verdene Rio, PharmD PGY1 Pharmacy Resident

## 2024-03-16 DIAGNOSIS — H524 Presbyopia: Secondary | ICD-10-CM | POA: Diagnosis not present

## 2024-03-16 DIAGNOSIS — H25813 Combined forms of age-related cataract, bilateral: Secondary | ICD-10-CM | POA: Diagnosis not present

## 2024-03-16 DIAGNOSIS — H52213 Irregular astigmatism, bilateral: Secondary | ICD-10-CM | POA: Diagnosis not present

## 2024-03-29 ENCOUNTER — Ambulatory Visit (INDEPENDENT_AMBULATORY_CARE_PROVIDER_SITE_OTHER): Admitting: Family Medicine

## 2024-03-29 ENCOUNTER — Encounter: Payer: Self-pay | Admitting: Family Medicine

## 2024-03-29 VITALS — BP 138/80 | HR 76 | Temp 98.5°F | Resp 16 | Ht 65.5 in | Wt 254.0 lb

## 2024-03-29 DIAGNOSIS — E876 Hypokalemia: Secondary | ICD-10-CM | POA: Diagnosis not present

## 2024-03-29 DIAGNOSIS — Z7985 Long-term (current) use of injectable non-insulin antidiabetic drugs: Secondary | ICD-10-CM

## 2024-03-29 DIAGNOSIS — E118 Type 2 diabetes mellitus with unspecified complications: Secondary | ICD-10-CM | POA: Diagnosis not present

## 2024-03-29 DIAGNOSIS — I10 Essential (primary) hypertension: Secondary | ICD-10-CM

## 2024-03-29 DIAGNOSIS — R079 Chest pain, unspecified: Secondary | ICD-10-CM | POA: Diagnosis not present

## 2024-03-29 DIAGNOSIS — Z7984 Long term (current) use of oral hypoglycemic drugs: Secondary | ICD-10-CM

## 2024-03-29 DIAGNOSIS — N1831 Chronic kidney disease, stage 3a: Secondary | ICD-10-CM | POA: Diagnosis not present

## 2024-03-29 LAB — POCT GLYCOSYLATED HEMOGLOBIN (HGB A1C): HbA1c, POC (prediabetic range): 6.3 % (ref 5.7–6.4)

## 2024-03-29 MED ORDER — IRBESARTAN 150 MG PO TABS
150.0000 mg | ORAL_TABLET | Freq: Every day | ORAL | 1 refills | Status: DC
Start: 2024-03-29 — End: 2024-06-12

## 2024-03-29 NOTE — Assessment & Plan Note (Deleted)
 HgA1C at goal, stable at 6.3. Continue Jardiance 25 mg daily and Ozempic 1 mg weekly.  Glipizide has been discontinued a few months ago. Encouraged to follow dietary recommendations. Annual eye exam, periodic dental and foot care to continue. F/U in 5-6 months.

## 2024-03-29 NOTE — Assessment & Plan Note (Signed)
 BP mildly elevated today and reporting elevated BPs at home. Recommend increasing dose of irbesartan from 75 mg to 150 mg daily. Continue amlodipine 10 mg daily and metoprolol succinate 50 mg daily. Continue monitoring BP regularly, instructed to let me know about BP readings in 2 to 3 weeks. Eye exam is current. Follow-up in 6 months, before if needed.

## 2024-03-29 NOTE — Assessment & Plan Note (Signed)
 HgA1C at goal, stable at 6.3. Continue Jardiance 25 mg daily and Ozempic 1 mg weekly.  Glipizide has been discontinued a few months ago. Encouraged to follow dietary recommendations. Annual eye exam, periodic dental and foot care to continue. F/U in 5-6 months.

## 2024-03-29 NOTE — Progress Notes (Signed)
 HPI: Charles [name] is a 73 y.o. male with a PMHx significant for atherosclerosis of aorta, HTN, DM II, asthma, GERD, HLD, gout, and chronic pain, among others, who is here today for chronic disease management.  Last seen on 11/24/2023  Diabetes Mellitus II:  Dx'ed around 2011  - He checks his blood sugar at home and says his readings have been 130 at the highest. When he doesn't eat, it will go into the 80s.  - Currently on Jardiance 25 mg daily and Ozempic 1 mg weekly. He says he is also still being sent Glimepiride, taking a 1/2 sometimes. - Exercise: He is walking a few times per week but is having some problems with pain in his foot.  - Diet: No significant dietary changes since his last visit.  - eye exam: UTD on routine vision care. He is going to be having cataract surgery in his right eye with general anesthesia. He has an appointment with them again on 4/17 before procedure. - foot exam: 11/2023 - Negative for symptoms of hypoglycemia, polyuria, polydipsia, numbness extremities, foot ulcers/trauma  Lab Results  Component Value Date   HGBA1C 6.3 03/29/2024   Lab Results  Component Value Date   MICROALBUR 0.9 11/24/2023   Hypertension and CKD III:  Currently on amlodipine 10 mg daily, irbesartan 75 mg daily, and metoprolol succinate 50 mg daily.  HypoK+ on K-Lor 20 mK daily.  BP readings at home: He has been checking his BP daily at home and his readings have been elevated.  Negative for unusual or severe headache, visual changes, dyspnea, palpitations, focal weakness, or edema. Lab Results  Component Value Date   NA 141 11/24/2023   CL 106 11/24/2023   K 3.9 11/24/2023   CO2 26 11/24/2023   BUN 19 11/24/2023   CREATININE 1.32 11/24/2023   GFR 54.12 (L) 11/24/2023   CALCIUM 9.8 11/24/2023   ALBUMIN 4.4 11/24/2023   GLUCOSE 95 11/24/2023   Hyperlipidemia: Currently on rosuvastatin 20 mg daily. Also taking aspirin 81 mg daily.  Side effects from medication:  none Lab Results  Component Value Date   CHOL 121 11/24/2023   HDL 39.80 11/24/2023   LDLCALC 55 11/24/2023   TRIG 132.0 11/24/2023   CHOLHDL 3 11/24/2023   Chest pain:  Patient mentions he woke up last night with left-sided chest pain. Also endorses associated diaphoresis and some SOB.  He says he burped and passed gas and the pain gradually resolved, he is not sure about duration. He denies any CP or dyspnea with exertion. Has not seen his cardiologist since 2022. Last EKG was in 2023.   Review of Systems  Constitutional:  Negative for activity change, appetite change, chills and fever.  HENT:  Negative for sore throat and trouble swallowing.   Respiratory:  Negative for cough and wheezing.   Gastrointestinal:  Negative for abdominal pain, nausea and vomiting.  Genitourinary:  Negative for decreased urine volume, dysuria and hematuria.  Musculoskeletal:  Positive for arthralgias and back pain. Negative for gait problem.  Skin:  Negative for rash.  Neurological:  Negative for syncope and facial asymmetry.  Psychiatric/Behavioral:  Negative for confusion and hallucinations.   See other pertinent positives and negatives in HPI.  Current Outpatient Medications on File Prior to Visit  Medication Sig Dispense Refill   Accu-Chek FastClix Lancets MISC TEST BLOOD SUGARS TWO TIMES DAILY. 204 each 3   ACCU-CHEK GUIDE TEST test strip TEST BLOOD SUGAR 1 TO 2 TIMES DAILY 200 strip  3   albuterol (VENTOLIN HFA) 108 (90 Base) MCG/ACT inhaler Inhale 2 puffs into the lungs every 6 (six) hours as needed for wheezing or shortness of breath. 8 g 1   Alcohol Swabs (B-D SINGLE USE SWABS REGULAR) PADS 1 Device by Does not apply route daily. 90 each 3   allopurinol (ZYLOPRIM) 300 MG tablet TAKE 1 TABLET EVERY DAY 90 tablet 3   amLODipine (NORVASC) 10 MG tablet TAKE 1 TABLET EVERY DAY 90 tablet 3   aspirin 81 MG tablet Take 81 mg by mouth every evening.      diclofenac Sodium (VOLTAREN) 1 % GEL Apply 2 g  topically 4 (four) times daily. 150 g 3   empagliflozin (JARDIANCE) 25 MG TABS tablet Take 1 tablet (25 mg total) by mouth daily before breakfast. 30 tablet 3   metoprolol succinate (TOPROL-XL) 50 MG 24 hr tablet TAKE 1 TABLET EVERY DAY WITH A MEAL OR IMMEDIATELY FOLLOWING A MEAL 90 tablet 3   potassium chloride SA (KLOR-CON M) 20 MEQ tablet TAKE 1 TABLET EVERY DAY 90 tablet 3   rosuvastatin (CRESTOR) 20 MG tablet TAKE 1 TABLET EVERY DAY 90 tablet 3   Semaglutide, 1 MG/DOSE, 4 MG/3ML SOPN Inject 1 mg as directed once a week. Use voucher do not bill insurance BIN [phone] PCN CNRX ID [id number] Group [id number] 3 mL 0   triamcinolone cream (KENALOG) 0.1 % Apply 2 times daily, small amount on right hand for 2 weeks then 1-2 times per week. 30 g 2   No current facility-administered medications on file prior to visit.    Past Medical History:  Diagnosis Date   Abnormal carotid ultrasound 07/2015   mild bilateral stenosis, repeat in 1 year.  Dr. Rennis Golden   Diabetes mellitus without complication (HCC)    Gout    Hepatitis C antibody test positive 05/2015   prior infection, resolved   History of cardiovascular stress test 9/16   Lexiscan, EF 55%, no ischemia, Dr. Rennis Golden   Hyperlipidemia    Hypertension    Noncompliance    Obesity    PVD (peripheral vascular disease) (HCC) 07/2015   normal ABIs, Dr. Rennis Golden   Allergies  Allergen Reactions   Penicillins Other (See Comments)    Per pt blacked out after injection as a child.  Did it involve swelling of the face/tongue/throat, SOB, or low BP? N Did it involve sudden or severe rash/hives, skin peeling, or any reaction on the inside of your mouth or nose? N Did you need to seek medical attention at a hospital or doctor's office? N When did it last happen?   Several Decades Ago    If all above answers are "NO", may proceed with cephalosporin use.     Social History   Socioeconomic History   Marital status: Divorced    Spouse name: Not on file    Number of children: Not on file   Years of education: Not on file   Highest education level: 10th grade  Occupational History   Not on file  Tobacco Use   Smoking status: Former    Current packs/day: 0.00    Average packs/day: 1 pack/day for 30.0 years (30.0 ttl pk-yrs)    Types: Cigarettes    Start date: 07/30/1965    Quit date: 07/31/1995    Years since quitting: 28.6   Smokeless tobacco: Never  Vaping Use   Vaping status: Never Used  Substance and Sexual Activity   Alcohol use: No    Alcohol/week:  0.0 standard drinks of alcohol   Drug use: No   Sexual activity: Not on file  Other Topics Concern   Not on file  Social History Narrative   Not on file   Social Drivers of Health   Financial Resource Strain: Medium Risk (11/24/2023)   Overall Financial Resource Strain (CARDIA)    Difficulty of Paying Living Expenses: Somewhat hard  Food Insecurity: No Food Insecurity (03/02/2024)   Hunger Vital Sign    Worried About Running Out of Food in the Last Year: Never true    Ran Out of Food in the Last Year: Never true  Transportation Needs: No Transportation Needs (03/02/2024)   PRAPARE - Administrator, Civil Service (Medical): No    Lack of Transportation (Non-Medical): No  Physical Activity: Insufficiently Active (11/24/2023)   Exercise Vital Sign    Days of Exercise per Week: 5 days    Minutes of Exercise per Session: 20 min  Stress: Stress Concern Present (11/24/2023)   [employer] of Occupational Health - Occupational Stress Questionnaire    Feeling of Stress : To some extent  Social Connections: Socially Isolated (11/24/2023)   Social Connection and Isolation Panel [NHANES]    Frequency of Communication with Friends and Family: More than three times a week    Frequency of Social Gatherings with Friends and Family: Never    Attends Religious Services: Never    Database administrator or Organizations: No    Attends Banker Meetings: Not on file     Marital Status: Divorced   Vitals:   03/29/24 1034 03/29/24 1115  BP: 138/80   Pulse: (!) 41 76  Resp: 16   Temp: 98.5 F (36.9 C)   SpO2: 96%    Body mass index is 41.62 kg/m.  Physical Exam Vitals and nursing note reviewed.  Constitutional:      General: He is not in acute distress.    Appearance: He is well-developed.  HENT:     Head: Normocephalic and atraumatic.     Mouth/Throat:     Mouth: Mucous membranes are moist.     Pharynx: Uvula midline.  Eyes:     Conjunctiva/sclera: Conjunctivae normal.  Cardiovascular:     Rate and Rhythm: Normal rate and regular rhythm.     Pulses:          Dorsalis pedis pulses are 2+ on the right side and 2+ on the left side.     Heart sounds: No murmur heard. Pulmonary:     Effort: Pulmonary effort is normal. No respiratory distress.     Breath sounds: Normal breath sounds.  Abdominal:     Palpations: Abdomen is soft. There is no hepatomegaly or mass.     Tenderness: There is no abdominal tenderness.  Musculoskeletal:     Thoracic back: No tenderness.     Lumbar back: No tenderness or bony tenderness.     Right lower leg: No edema.     Left lower leg: No edema.  Lymphadenopathy:     Cervical: No cervical adenopathy.  Skin:    General: Skin is warm.     Findings: No erythema or rash.  Neurological:     Mental Status: He is alert and oriented to person, place, and time.     Cranial Nerves: No cranial nerve deficit.     Gait: Gait normal.  Psychiatric:        Mood and Affect: Mood and affect normal.  ASSESSMENT AND PLAN:  Mr. Warmuth was seen today for chronic disease management.   Orders Placed This Encounter  Procedures   Microalbumin / creatinine urine ratio   Basic metabolic panel with GFR   Ambulatory referral to Cardiology   POC HgB A1c   EKG 12-Lead   Diabetes mellitus with complication (HCC) Assessment & Plan: HgA1C at goal, stable at 6.3. Continue Jardiance 25 mg daily and Ozempic 1 mg weekly.  Glipizide  has been discontinued a few months ago. Encouraged to follow dietary recommendations. Annual eye exam, periodic dental and foot care to continue. F/U in 5-6 months.  Orders: -     POCT glycosylated hemoglobin (Hb A1C) -     Microalbumin / creatinine urine ratio; Future -     Basic metabolic panel with GFR; Future  Essential hypertension Assessment & Plan: BP mildly elevated today and reporting elevated BPs at home. Recommend increasing dose of irbesartan from 75 mg to 150 mg daily. Continue amlodipine 10 mg daily and metoprolol succinate 50 mg daily. Continue monitoring BP regularly, instructed to let me know about BP readings in 2 to 3 weeks. Eye exam is current. Follow-up in 6 months, before if needed.  Orders: -     Irbesartan; Take 1 tablet (150 mg total) by mouth daily.  Dispense: 90 tablet; Refill: 1  Stage 3a chronic kidney disease (HCC) Assessment & Plan: Problem has been stable, last creatinine 1.3 and EGFR 54.1. Continue adequate hydration, low-salt diet, and avoidance of NSAIDs. It is very important to maintain glucose and BP adequately controlled. Continue Jardiance 25 mg daily.  Orders: -     Irbesartan; Take 1 tablet (150 mg total) by mouth daily.  Dispense: 90 tablet; Refill: 1  Chest pain, unspecified type Myocardial perfusion imaging done in 05/2018 with minimal reversible ischemia, low risk study. NSR, LAD, ? LAHB.  When compared with EKG done in 11/2021 T wave changes on lateral leads no longer present. He has not seen cardiology since 2022, new referral placed. He was clearly instructed about warning signs, if he has another episode he was instructed to seek immediate medical attention.  -     Ambulatory referral to Cardiology -     EKG 12-Lead  Hypokalemia Assessment & Plan: Irbesartan dose increased today, so recommend holding on K-Lor 20 mK for now. BMP in 2 to 3 weeks, further recommendation will be given according to lab results.  In regards to  cataract surgery, which according to patient is going to be performed under general anesthesia, he was instructed to hold on Aspirin and avoid OTC supplements and NSAIDs 7 days before procedure.  Hold Ozempic a week before.  Return in about 6 months (around 09/28/2024) for chronic problems.  I, Rolla Etienne Wierda, acting as a scribe for Deryk Bozman [place], MD., have documented all relevant documentation on the behalf of Amaru Burroughs [place], MD, as directed by  Oneisha Ammons [place], MD while in the presence of Trystyn Dolley [place], MD.   I, Abimbola Aki [place], MD, have reviewed all documentation for this visit. The documentation on 03/29/24 for the exam, diagnosis, procedures, and orders are all accurate and complete.  Elka Satterfield G. [place], MD  [facility] [place] office.

## 2024-03-29 NOTE — Patient Instructions (Addendum)
 A few things to remember from today's visit:  Diabetes mellitus with complication (HCC) - Plan: POC HgB A1c, Microalbumin / creatinine urine ratio, Basic metabolic panel with GFR  Essential hypertension - Plan: irbesartan (AVAPRO) 150 MG tablet  Stage 3a chronic kidney disease (HCC) - Plan: irbesartan (AVAPRO) 150 MG tablet  Chest pain, unspecified type - Plan: Ambulatory referral to Cardiology, EKG 12-Lead  Stop potassium. Increase Irbesartan from 75 mg to 150 mg daily. Monitor blood pressure. Let me know about blood pressure readings in 2-3 weeks. Lab in 2-3 weeks.  If another episode of chest pain, please seek immediate medical attention.  If you need refills for medications you take chronically, please call your pharmacy. Do not use My Chart to request refills or for acute issues that need immediate attention. If you send a my chart message, it may take a few days to be addressed, specially if I am not in the office.  Please be sure medication list is accurate. If a new problem present, please set up appointment sooner than planned today.

## 2024-03-29 NOTE — Assessment & Plan Note (Signed)
 Problem has been stable, last creatinine 1.3 and EGFR 54.1. Continue adequate hydration, low-salt diet, and avoidance of NSAIDs. It is very important to maintain glucose and BP adequately controlled. Continue Jardiance 25 mg daily.

## 2024-03-29 NOTE — Assessment & Plan Note (Signed)
 He irbesartan dose increased today, so recommend holding on K-Lor 20 mK for now. BMP in 2 to 3 weeks, further recommendation will be given according to lab results.

## 2024-04-19 ENCOUNTER — Other Ambulatory Visit (INDEPENDENT_AMBULATORY_CARE_PROVIDER_SITE_OTHER)

## 2024-04-19 DIAGNOSIS — E118 Type 2 diabetes mellitus with unspecified complications: Secondary | ICD-10-CM | POA: Diagnosis not present

## 2024-04-19 LAB — BASIC METABOLIC PANEL WITH GFR
BUN: 19 mg/dL (ref 6–23)
CO2: 23 meq/L (ref 19–32)
Calcium: 9.4 mg/dL (ref 8.4–10.5)
Chloride: 104 meq/L (ref 96–112)
Creatinine, Ser: 1.47 mg/dL (ref 0.40–1.50)
GFR: 47.43 mL/min — ABNORMAL LOW (ref >60.00)
Glucose, Bld: 132 mg/dL — ABNORMAL HIGH (ref 70–99)
Potassium: 3.8 meq/L (ref 3.5–5.1)
Sodium: 139 meq/L (ref 135–145)

## 2024-04-19 LAB — MICROALBUMIN / CREATININE URINE RATIO
Creatinine,U: 151.7 mg/dL
Microalb Creat Ratio: 15.3 mg/g (ref 0.0–30.0)
Microalb, Ur: 2.3 mg/dL — ABNORMAL HIGH (ref 0.0–1.9)

## 2024-04-28 ENCOUNTER — Telehealth: Payer: Self-pay

## 2024-04-28 NOTE — Telephone Encounter (Signed)
 Copied from CRM [id number]. Topic: Clinical - Medical Advice >> Apr 28, 2024  1:45 PM Adaysia C wrote:  Reason for CRM: Beck([employer] Pharmacist) called to inform PCP that the patient has requested a follow up call from the PCP or a nurse to discuss what medications he needs to be taking now with the correct dosages because he is about to have surgery in 2 weeks; patient has also asked if the PCP or nurse can go over his  MyChart values with him and discuss what they mean; please follow up with patient when available [phone]

## 2024-04-28 NOTE — Telephone Encounter (Signed)
 I called and spoke with pt. We went over his medications and everything was correct. Pt is still having concerns about his blood sugar running in the 120s-130s in the morning when he wakes up and also about his blood pressure running high. He has been checking both at home daily; he will set up an appt to come in and have his machines checked against ours after his eye surgery. He would like to know the results of his lab work & also how urgent it is for him to see cardiology? They have him scheduled for July currently and he is being considered a new patient. Patient has access to his mychart and is requesting a message from you addressing the above.

## 2024-05-01 ENCOUNTER — Encounter: Payer: Self-pay | Admitting: Family Medicine

## 2024-05-01 NOTE — Telephone Encounter (Signed)
 PCP sent pt mychart message with response.

## 2024-05-02 DIAGNOSIS — H25813 Combined forms of age-related cataract, bilateral: Secondary | ICD-10-CM | POA: Diagnosis not present

## 2024-05-02 DIAGNOSIS — H2511 Age-related nuclear cataract, right eye: Secondary | ICD-10-CM | POA: Diagnosis not present

## 2024-05-02 DIAGNOSIS — H268 Other specified cataract: Secondary | ICD-10-CM | POA: Diagnosis not present

## 2024-05-04 ENCOUNTER — Other Ambulatory Visit: Payer: Self-pay

## 2024-05-04 NOTE — Patient Instructions (Signed)
 Visit Information  Thank you for taking time to visit with me today. Please don't hesitate to contact me if I can be of assistance to you before our next scheduled telephone appointment.  Our next appointment is by telephone on 06/06/2024 at 11am  Following is a copy of your care plan:   Goals Addressed             This Visit's Progress    VBCI RN Care Plan       Problems:  Chronic Disease Management support and education needs related to DMII  Goal: Over the next 3 months the Patient will continue to work with RN Care Manager and/or Social Worker to address care management and care coordination needs related to DMII as evidenced by adherence to care management team scheduled appointments      Interventions:   Diabetes Interventions: Assessed patient's understanding of A1c goal: <7% Provided education to patient about basic DM disease process Reviewed medications with patient and discussed importance of medication adherence Counseled on importance of regular laboratory monitoring as prescribed Discussed plans with patient for ongoing care management follow up and provided patient with direct contact information for care management team Review of patient status, including review of consultants reports, relevant laboratory and other test results, and medications completed Screening for signs and symptoms of depression related to chronic disease state  Assessed social determinant of health barriers Lab Results  Component Value Date   HGBA1C 6.3 03/29/2024    Patient Self-Care Activities:  Attend all scheduled provider appointments Call pharmacy for medication refills 3-7 days in advance of running out of medications Call provider office for new concerns or questions  Perform all self care activities independently  Perform IADL's (shopping, preparing meals, housekeeping, managing finances) independently Take medications as prescribed    Plan:  The patient has been provided  with contact information for the care management team and has been advised to call with any health related questions or concerns.           VBCI RN Care Plan       Problems:  Chronic Disease Management support and education needs related to HTN  Goal: Over the next 3 months the Patient will continue to work with RN Care Manager and/or Social Worker to address care management and care coordination needs related to HTN as evidenced by adherence to care management team scheduled appointments      Interventions:   Hypertension Interventions: Last practice recorded BP readings:  BP Readings from Last 3 Encounters:  03/29/24 138/80  11/24/23 138/82  06/09/23 136/80   Most recent eGFR/CrCl: No results found for: "EGFR"  No components found for: "CRCL"  Evaluation of current treatment plan related to hypertension self management and patient's adherence to plan as established by provider Provided education to patient re: stroke prevention, s/s of heart attack and stroke Reviewed medications with patient and discussed importance of compliance Counseled on the importance of exercise goals with target of 150 minutes per week Discussed plans with patient for ongoing care management follow up and provided patient with direct contact information for care management team Advised patient, providing education and rationale, to monitor blood pressure daily and record, calling PCP for findings outside established parameters Discussed complications of poorly controlled blood pressure such as heart disease, stroke, circulatory complications, vision complications, kidney impairment, sexual dysfunction Screening for signs and symptoms of depression related to chronic disease state  Assessed social determinant of health barriers  Patient Self-Care Activities:  Attend all scheduled provider appointments Call pharmacy for medication refills 3-7 days in advance of running out of medications Call provider  office for new concerns or questions  Perform all self care activities independently  Perform IADL's (shopping, preparing meals, housekeeping, managing finances) independently Take medications as prescribed   keep a blood pressure log take blood pressure log to all doctor appointments take medications for blood pressure exactly as prescribed begin an exercise program report new symptoms to your doctor  Plan:  The patient has been provided with contact information for the care management team and has been advised to call with any health related questions or concerns.              Patient verbalizes understanding of instructions and care plan provided today and agrees to view in MyChart. Active MyChart status and patient understanding of how to access instructions and care plan via MyChart confirmed with patient.     The patient has been provided with contact information for the care management team and has been advised to call with any health related questions or concerns.   Please call the care guide team at [phone] if you need to cancel or reschedule your appointment.   Please call 1-800-273-TALK (toll free, 24 hour hotline) if you are experiencing a Mental Health or Behavioral Health Crisis or need someone to talk to.  Jayme Cham A. Saverio Curling RN, BA, [facility], CRRN   [facility] Population Health RN Care Manager Direct Dial: [phone]  Fax: [phone]

## 2024-05-04 NOTE — Patient Outreach (Signed)
 Complex Care Management   Visit Note  05/04/2024  Name:  Charles [name] MRN: [id number] DOB: 11-Jan-1951  Situation: Referral received for Complex Care Management related to Diabetes with Complications and Hypertension I obtained verbal consent from Patient.  Visit completed with Patient & RNCM  on the phone  Background:   Past Medical History:  Diagnosis Date   Abnormal carotid ultrasound 07/2015   mild bilateral stenosis, repeat in 1 year.  Dr. Maximo Spar   Diabetes mellitus without complication (HCC)    Gout    Hepatitis C antibody test positive 05/2015   prior infection, resolved   History of cardiovascular stress test 9/16   Lexiscan , EF 55%, no ischemia, Dr. Maximo Spar   Hyperlipidemia    Hypertension    Noncompliance    Obesity    PVD (peripheral vascular disease) (HCC) 07/2015   normal ABIs, Dr. Maximo Spar    Assessment: Patient Reported Symptoms:  Cognitive Cognitive Status: Alert and oriented to person, place, and time, Normal speech and language skills      Neurological Neurological Review of Symptoms: No symptoms reported    HEENT HEENT Symptoms Reported: Other: Other HEENT Symptoms/Conditions: Just had cataract surgery completed on Right eye on Tuesday, 05/02/24 and is home recovering from procedure, was advised to not pick up anything heavy but patient revealed to me that he had picked up a television to move it and instantly realized he wasn't supposed to lift anything heavy and immediately put it down. Patient is concerned he may have done some damage to his right eye, strongly urged patient to call his Eye Doctor who completed the surgery to review what had happened to put his mind at ease as he is noting some "slanting" of normallycompletely horizontal objects such as a table - patient commited that he was going to do that right after our call. HEENT Conditions: Vision problem(s) Vision Problems: cataract(s) HEENT Management Strategies: Medication therapy, Coping strategies,  Routine screening, Adequate rest HEENT Self-Management Outcome: 3 (uncertain) Vision problem(s)  Cardiovascular Does patient have uncontrolled Hypertension?: No (patient states his BP was elevated today and yesterday 153/94 today and 144/84 yesterday - advised to call his Eye Doctor first to report picking up TV and having some vision peculiarities to the affected eye) Cardiovascular Conditions: Hypertension Cardiovascular Management Strategies: Medication therapy, Diet modification, Coping strategies, Adequate rest, Activity Cardiovascular Self-Management Outcome: 3 (uncertain) Cardiovascular Comment: Last couple of days post cataract surgery (5/13) BPs have crept up from normals 130's/80s. Last 2 BPs from yesterday and today were 153/94, 144/84. Patient notably anxious about how his right eye is healing post surgery, especially after accidentally picking up a TV to move it to a different location before realizing patient was told post eye surgery not to lift heavy objects. Encouraged patient to call his Eye Doctor who completed the surgery to review some visual issues he has had since surgery as well as report the lifting of a heavy object, albeit for a very brief period of time once he realized what he had done. patient commited to calling Eye Doctor after our call.  Respiratory Respiratory Symptoms Reported: No symptoms reported Respiratory Conditions: Asthma Respiratory Self-Management Outcome: 4 (good)  Endocrine Patient reports the following symptoms related to hypoglycemia or hyperglycemia : No symptoms reported Is patient diabetic?: Yes Is patient checking blood sugars at home?: Yes Endocrine Conditions: Diabetes Endocrine Management Strategies: Medication therapy, Coping strategies, Routine screening, Diet modification Endocrine Self-Management Outcome: 4 (good) Endocrine Comment: 03/29/24 A1c = 6.3 (at goal  of <7)  Gastrointestinal Gastrointestinal Symptoms Reported: No symptoms  reported Gastrointestinal Conditions: Reflux/heartburn Gastrointestinal Management Strategies: Diet modification Gastrointestinal Self-Management Outcome: 4 (good) Nutrition Risk Screen (CP): No indicators present  Genitourinary Genitourinary Symptoms Reported: No symptoms reported Genitourinary Conditions: Chronic kidney disease (Suffers from Chronic Kidney Disease 3a)  Integumentary Integumentary Symptoms Reported: No symptoms reported    Musculoskeletal Musculoskelatal Symptoms Reviewed: No symptoms reported Musculoskeletal Conditions: Other Other Musculoskeletal Conditions: suffers from gout flare ups intermittently Musculoskeletal Management Strategies: Coping strategies, Diet modification, Medication therapy Falls in the past year?: No Number of falls in past year: 1 or less Was there an injury with Fall?: No Fall Risk Category Calculator: 0 Patient Fall Risk Level: Low Fall Risk Fall risk Follow up: Falls prevention discussed, Education provided, Falls evaluation completed  Psychosocial Psychosocial Symptoms Reported: No symptoms reported   Major Change/Loss/Stressor/Fears (CP): Medical condition, self Quality of Family Relationships: helpful Do you feel physically threatened by others?: No      05/04/2024    8:41 PM  Depression screen PHQ 2/9  Decreased Interest 0  Down, Depressed, Hopeless 0  PHQ - 2 Score 0    Vitals:   05/04/24 0900  BP: (!) 144/84    Medications Reviewed Today     Reviewed by Randye Buttner, RN (Registered Nurse) on 05/04/24 at 2034  Med List Status: <None>   Medication Order Taking? Sig Documenting Provider Last Dose Status Informant  Accu-Chek FastClix Lancets MISC [id number] Yes TEST BLOOD SUGARS TWO TIMES DAILY. [place], [name], MD Taking Active   ACCU-CHEK GUIDE TEST test strip [id number] Yes TEST BLOOD SUGAR 1 TO 2 TIMES DAILY [place], [name], MD Taking Active   albuterol  (VENTOLIN  HFA) 108 [phone] Base) MCG/ACT inhaler [id number] Yes  Inhale 2 puffs into the lungs every 6 (six) hours as needed for wheezing or shortness of breath. [place], [name], MD Taking Active   Alcohol Swabs (B-D SINGLE USE SWABS REGULAR) PADS [id number] Yes 1 Device by Does not apply route daily. [place], [name], MD Taking Active   allopurinol  (ZYLOPRIM ) 300 MG tablet [id number] Yes TAKE 1 TABLET EVERY DAY [place], [name], MD Taking Active   amLODipine  (NORVASC ) 10 MG tablet [id number] Yes TAKE 1 TABLET EVERY DAY [place], [name], MD Taking Active   aspirin  81 MG tablet [id number] Yes Take 81 mg by mouth every evening.  [provider] Taking Active Self, Pharmacy Records  diclofenac  Sodium (VOLTAREN ) 1 % GEL [id number] Yes Apply 2 g topically 4 (four) times daily. [place], [name], MD Taking Active   empagliflozin  (JARDIANCE ) 25 MG TABS tablet [id number] Yes Take 1 tablet (25 mg total) by mouth daily before breakfast. [place], [name], MD Taking Active            Med Note Finley Hugh, [facility]   Thu Sep 02, 2023 10:39 AM) PAP  irbesartan  (AVAPRO ) 150 MG tablet [id number] Yes Take 1 tablet (150 mg total) by mouth daily. [place], [name], MD Taking Active   metoprolol  succinate (TOPROL -XL) 50 MG 24 hr tablet [id number] Yes TAKE 1 TABLET EVERY DAY WITH A MEAL OR IMMEDIATELY FOLLOWING A MEAL [place], [name], MD Taking Active   potassium chloride  SA (KLOR-CON  M) 20 MEQ tablet [id number] Yes TAKE 1 TABLET EVERY DAY [place], [name], MD Taking Active   rosuvastatin  (CRESTOR ) 20 MG tablet [id number] Yes TAKE 1 TABLET EVERY DAY [place], [name], MD Taking Active   Semaglutide , 1 MG/DOSE, 4 MG/3ML SOPN [id number] Yes Inject 1 mg  as directed once a week. Use voucher do not bill insurance BIN [id number] PCN CNRX ID [id number] Group [id number] [place], [name], MD Taking Active   triamcinolone  cream (KENALOG ) 0.1 % [id number] Yes Apply 2 times daily, small amount on right hand for 2 weeks then 1-2 times per week. [place], [name], MD Taking Active             Recommendation:   PCP  Follow-up  Follow Up Plan:   Telephone follow up appointment date/time:  06/06/2024 @ 11am with RN Case Manager Barbra Ley A. Saverio Curling RN, BA, [facility], CRRN [place]  [facility] Population Health RN Care Manager Direct Dial: [phone]  Fax: [phone]

## 2024-05-19 NOTE — Progress Notes (Signed)
   05/19/2024  Patient ID: Charles [name], male   DOB: Jan 16, 1951, 73 y.o.   MRN: [id number]  Pharmacy Quality Measure Review  This patient is appearing on a report for being at risk of failing the adherence measure for diabetes medications this calendar year.   Medication: Glimepiride  Last fill date: 01/13/24 for 90 day supply  Medication has been discontinued. No further action indicated at this time.  [name], PharmD Clinical Pharmacist [phone]

## 2024-06-02 ENCOUNTER — Telehealth: Payer: Self-pay

## 2024-06-02 NOTE — Telephone Encounter (Signed)
 I called and spoke with pt, he is aware his PAP Ozempic  came in and will come by to pick it up.

## 2024-06-05 ENCOUNTER — Other Ambulatory Visit (INDEPENDENT_AMBULATORY_CARE_PROVIDER_SITE_OTHER)

## 2024-06-05 DIAGNOSIS — E118 Type 2 diabetes mellitus with unspecified complications: Secondary | ICD-10-CM

## 2024-06-05 DIAGNOSIS — I10 Essential (primary) hypertension: Secondary | ICD-10-CM

## 2024-06-05 NOTE — Progress Notes (Signed)
 06/05/2024 Name: Charles [name] MRN: [id number] DOB: 12-Jul-1951  Chief Complaint  Patient presents with   Diabetes   Medication Management   Hypertension    Charles [name] is a 73 y.o. year old male who presented for a telephone visit.   They were referred to the pharmacist by their PCP for assistance in managing diabetes.    Subjective:  Care Team: Primary Care Provider: [place], [name], MD ; Next Scheduled Visit: 06/12/24  Medication Access/Adherence  Current Pharmacy:  [facility] - [place], [place] - [address] [place] [address] Phone: [phone] Fax: [phone]  [id number] - [place], [place] - [address] AT [address] OF [facility] [address] [place] [address] Phone: [phone] Fax: [phone]   Patient reports affordability concerns with their medications: No  Patient reports access/transportation concerns to their pharmacy: Yes-placing referral to social work  Patient reports adherence concerns with their medications:  No     Diabetes:  Current medications: Ozempic  1mg , Jardiance  25mg  Medications tried in the past: Glimepiride , Trulicity , Metformin , Actos   Current glucose readings: denies any lows since stopping the glimepiride , fasting 130 today, ranged from 103-132 the last week Checking sugars once daily, fasting   Patient denies hypoglycemic s/sx including dizziness, shakiness, sweating. Patient denies hyperglycemic symptoms including polyuria, polydipsia, polyphagia, nocturia, neuropathy, blurred vision.  Current medication access support: Jardiance  25mg  through [facility], Ozempic  1mg  through [facility]  Hypertension:  Current medications: Irbesartan  150mg  once daily, Amlodipine  10mg  daily, Metoprolol  XL 50mg  daily Medications previously tried: Lisinopril , Losartan -hydrochlorothiazide , spironolactone   Patient has a validated, automated, upper arm home BP  cuff Current blood pressure readings readings: 147/87 HR 91 during appt, down from 160/90 earlier this morning. Reports elevated readings since his cataract surgery on 05/02/24, notes he had some steroid eye drops that really made him feel poorly but he stopped those 4 days ago  Patient denies hypotensive s/sx including dizziness, lightheadedness.  Patient denies hypertensive symptoms including headache, chest pain, shortness of breath   Objective:  Lab Results  Component Value Date   HGBA1C 6.3 03/29/2024    Lab Results  Component Value Date   CREATININE 1.47 04/19/2024   BUN 19 04/19/2024   NA 139 04/19/2024   K 3.8 04/19/2024   CL 104 04/19/2024   CO2 23 04/19/2024    Lab Results  Component Value Date   CHOL 121 11/24/2023   HDL 39.80 11/24/2023   LDLCALC 55 11/24/2023   TRIG 132.0 11/24/2023   CHOLHDL 3 11/24/2023    Medications Reviewed Today     Reviewed by [name], RPH (Pharmacist) on 06/05/24 at 1033  Med List Status: <None>   Medication Order Taking? Sig Documenting Provider Last Dose Status Informant  Accu-Chek FastClix Lancets MISC [id number]  TEST BLOOD SUGARS TWO TIMES DAILY. [place], [name], MD  Active   ACCU-CHEK GUIDE TEST test strip [id number]  TEST BLOOD SUGAR 1 TO 2 TIMES DAILY [place], [name], MD  Active   albuterol  (VENTOLIN  HFA) 108 [phone] Base) MCG/ACT inhaler [id number] Yes Inhale 2 puffs into the lungs every 6 (six) hours as needed for wheezing or shortness of breath. [place], [name], MD  Active   Alcohol Swabs (B-D SINGLE USE SWABS REGULAR) PADS [id number]  1 Device by Does not apply route daily. [place], [name], MD  Active   allopurinol  (ZYLOPRIM ) 300 MG tablet [id number] Yes TAKE 1 TABLET EVERY DAY [place], [name], MD  Active   amLODipine  (NORVASC ) 10 MG tablet [id number] Yes TAKE 1 TABLET EVERY DAY [place], [name], MD  Active   aspirin  81 MG tablet [id number] Yes Take 81 mg by mouth every evening.  [provider]  Active Self,  Pharmacy Records    Discontinued 06/05/24 1003 (Patient Preference)   empagliflozin  (JARDIANCE ) 25 MG TABS tablet [id number] Yes Take 1 tablet (25 mg total) by mouth daily before breakfast. [place], [name], MD  Active            Med Note Finley Hugh, [facility] A   Thu Sep 02, 2023 10:39 AM) PAP  irbesartan  (AVAPRO ) 150 MG tablet [id number] Yes Take 1 tablet (150 mg total) by mouth daily. [place], [name], MD  Active   metoprolol  succinate (TOPROL -XL) 50 MG 24 hr tablet [id number] Yes TAKE 1 TABLET EVERY DAY WITH A MEAL OR IMMEDIATELY FOLLOWING A MEAL [place], [name], MD  Active     Discontinued 06/05/24 1004 (Patient Preference)   rosuvastatin  (CRESTOR ) 20 MG tablet [id number] Yes TAKE 1 TABLET EVERY DAY [place], [name], MD  Active   Semaglutide , 1 MG/DOSE, 4 MG/3ML SOPN [id number] Yes Inject 1 mg as directed once a week. Use voucher do not bill insurance BIN [id number] PCN CNRX ID [id number] Group [id number] [place], [name], MD  Active     Discontinued 06/05/24 1008 (Patient Preference)               Assessment/Plan:   Diabetes: - Currently controlled - Reviewed long term cardiovascular and renal outcomes of uncontrolled blood sugar - Reviewed goal A1c, goal fasting, and goal 2 hour post prandial glucose -Continue current medication therapy  Hypertension: - Currently uncontrolled - Reviewed long term cardiovascular and renal outcomes of uncontrolled blood pressure - Reviewed appropriate blood pressure monitoring technique and reviewed goal blood pressure. Recommended to check home blood pressure and heart rate once to twice daily - Recommend to continue current medication therapy for now, scheduled f/u with PCP at patient request. If BP remains elevated at home for the next week and is elevated at PCP visit, consider dose increase on irbesartan  or addition of thiazide diuretic. Bring home bp readings to appt   Follow Up Plan:  4 months (sees cardio in 1 month)  [name],  PharmD Clinical Pharmacist [phone]

## 2024-06-06 ENCOUNTER — Other Ambulatory Visit: Payer: Self-pay

## 2024-06-06 NOTE — Patient Instructions (Signed)
 Visit Information  Thank you for taking time to visit with me today. Please don't hesitate to contact me if I can be of assistance to you before our next scheduled telephone appointment.  Our next appointment is by telephone on 06/27/2024 at 11am  Following is a copy of your care plan:   Goals Addressed             This Visit's Progress    VBCI RN Care Plan       Problems:  Chronic Disease Management support and education needs related to DMII  Goal: Over the next 4 months the Patient will continue to work with RN Care Manager and/or Social Worker to address care management and care coordination needs related to DMII as evidenced by adherence to care management team scheduled appointments      Interventions:   Diabetes Interventions: Assessed patient's understanding of A1c goal: <7% Provided education to patient about basic DM disease process Reviewed medications with patient and discussed importance of medication adherence Counseled on importance of regular laboratory monitoring as prescribed Discussed plans with patient for ongoing care management follow up and provided patient with direct contact information for care management team Review of patient status, including review of consultants reports, relevant laboratory and other test results, and medications completed Screening for signs and symptoms of depression related to chronic disease state  Assessed social determinant of health barriers Lab Results  Component Value Date   HGBA1C 6.3 03/29/2024    Patient Self-Care Activities:  Attend all scheduled provider appointments Call pharmacy for medication refills 3-7 days in advance of running out of medications Call provider office for new concerns or questions  Perform all self care activities independently  Perform IADL's (shopping, preparing meals, housekeeping, managing finances) independently Take medications as prescribed    Plan:  The patient has been provided  with contact information for the care management team and has been advised to call with any health related questions or concerns.  Next RNCM follow up to discuss management of diabetes is 06/27/2024 at 11:00AM          VBCI RN Care Plan       Problems:  Chronic Disease Management support and education needs related to HTN  Goal: Over the next 4 months the Patient will continue to work with RN Care Manager and/or Social Worker to address care management and care coordination needs related to HTN as evidenced by adherence to care management team scheduled appointments      Interventions:   Hypertension Interventions: Last practice recorded BP readings:  BP Readings from Last 3 Encounters:  03/29/24 138/80  11/24/23 138/82  06/09/23 136/80   Most recent eGFR/CrCl: No results found for: EGFR  No components found for: CRCL  Evaluation of current treatment plan related to hypertension self management and patient's adherence to plan as established by provider Provided education to patient re: stroke prevention, s/s of heart attack and stroke Reviewed medications with patient and discussed importance of compliance Counseled on the importance of exercise goals with target of 150 minutes per week Discussed plans with patient for ongoing care management follow up and provided patient with direct contact information for care management team Advised patient, providing education and rationale, to monitor blood pressure daily and record, calling PCP for findings outside established parameters Discussed complications of poorly controlled blood pressure such as heart disease, stroke, circulatory complications, vision complications, kidney impairment, sexual dysfunction Screening for signs and symptoms of depression related to chronic disease  state  Assessed social determinant of health barriers  Patient Self-Care Activities:  Attend all scheduled provider appointments Call pharmacy for  medication refills 3-7 days in advance of running out of medications Call provider office for new concerns or questions  Perform all self care activities independently  Perform IADL's (shopping, preparing meals, housekeeping, managing finances) independently Take medications as prescribed   keep a blood pressure log take blood pressure log to all doctor appointments take medications for blood pressure exactly as prescribed begin an exercise program report new symptoms to your doctor  Plan:  The patient has been provided with contact information for the care management team and has been advised to call with any health related questions or concerns.  Patient to see Dr. Swaziland regarding his recent Blood Pressure readings trending upwards 150s - 170s/90s - 100s on 6/23/ 2025 @ 930am  Next RNCM Follow up to review ongoing goal of keeping blood pressure under control is 06/27/2024 @ 11am            Patient verbalizes understanding of instructions and care plan provided today and agrees to view in MyChart. Active MyChart status and patient understanding of how to access instructions and care plan via MyChart confirmed with patient.     The patient has been provided with contact information for the care management team and has been advised to call with any health related questions or concerns.   Please call the care guide team at [phone] if you need to cancel or reschedule your appointment.   Please call 1-800-273-TALK (toll free, 24 hour hotline) if you are experiencing a Mental Health or Behavioral Health Crisis or need someone to talk to.  Dua Mehler A. Saverio Curling RN, BA, [facility], CRRN [place]  [facility] Population Health RN Care Manager Direct Dial: [phone]  Fax: [phone]

## 2024-06-06 NOTE — Patient Outreach (Signed)
 Complex Care Management   Visit Note  06/06/2024  Name:  Charles [name] MRN: [id number] DOB: 1951/02/02  Situation: Referral received for Complex Care Management related to Diabetes with Complications and Hypertension I obtained verbal consent from Patient.  Visit completed with patient  on the phone  Background:   Past Medical History:  Diagnosis Date   Abnormal carotid ultrasound 07/2015   mild bilateral stenosis, repeat in 1 year.  Dr. Maximo Spar   Diabetes mellitus without complication (HCC)    Gout    Hepatitis C antibody test positive 05/2015   prior infection, resolved   History of cardiovascular stress test 9/16   Lexiscan , EF 55%, no ischemia, Dr. Maximo Spar   Hyperlipidemia    Hypertension    Noncompliance    Obesity    PVD (peripheral vascular disease) (HCC) 07/2015   normal ABIs, Dr. Maximo Spar    Assessment: Patient Reported Symptoms:  Cognitive Cognitive Status: Alert and oriented to person, place, and time, Insightful and able to interpret abstract concepts, Normal speech and language skills Cognitive/Intellectual Conditions Management [RPT]: None reported or documented in medical history or problem list   Health Maintenance Behaviors: Annual physical exam, Healthy diet Health Facilitated by: Healthy diet, Rest  Neurological Neurological Review of Symptoms: No symptoms reported    HEENT HEENT Symptoms Reported: No symptoms reported HEENT Conditions: Vision problem(s) Vision Problems: cataract(s) HEENT Management Strategies: Coping strategies, Routine screening, Adequate rest HEENT Comment: 05/02/24 had R eye cataract surgery which patient has recovered from and states his R eye vision is now excellent, Ophthalmologist is monitoring L eye - does not require cataract surgery right now Vision problem(s)  Cardiovascular Cardiovascular Symptoms Reported: Other: Other Cardiovascular Symptoms: has been getting higher than normal blood pressure readings at home - will follow up with  PCP on 6/23 regarding this Does patient have uncontrolled Hypertension?: Yes (suspected - has been getting higher than normal blood pressure readings at home - will follow up with PCP on 6/23 regarding this) Is patient checking Blood Pressure at home?: Yes Patient's Recent BP reading at home: 150s - 170 / 90s - 100 Cardiovascular Conditions: Hypertension Cardiovascular Management Strategies: Medication therapy, Diet modification, Coping strategies, Adequate rest, Activity, Routine screening, Weight management Cardiovascular Self-Management Outcome: 3 (uncertain) Cardiovascular Comment: Has follow up with PCP, Dr. Betty Swaziland on 6/23 about increasing BP readings he is taking himself w/ cuff daily at home  Respiratory Respiratory Symptoms Reported: No symptoms reported Respiratory Conditions: Asthma Respiratory Self-Management Outcome: 4 (good)  Endocrine Patient reports the following symptoms related to hypoglycemia or hyperglycemia : No symptoms reported Is patient diabetic?: Yes Is patient checking blood sugars at home?: Yes Endocrine Conditions: Diabetes Endocrine Management Strategies: Medication therapy, Coping strategies, Routine screening, Diet modification Endocrine Self-Management Outcome: 4 (good) Endocrine Comment: 03/29/24 A1c = 6.3 (at goal <7)  Gastrointestinal Gastrointestinal Symptoms Reported: No symptoms reported Gastrointestinal Conditions: Reflux/heartburn Gastrointestinal Management Strategies: Diet modification Gastrointestinal Self-Management Outcome: 4 (good) Nutrition Risk Screen (CP): No indicators present  Genitourinary Genitourinary Symptoms Reported: No symptoms reported Genitourinary Conditions: Chronic kidney disease Genitourinary Self-Management Outcome: 4 (good)  Integumentary Integumentary Symptoms Reported: No symptoms reported    Musculoskeletal Musculoskelatal Symptoms Reviewed: No symptoms reported Musculoskeletal Conditions: Other Other  Musculoskeletal Conditions: intermittently suffers from gout flare ups Musculoskeletal Management Strategies: Coping strategies, Diet modification, Medication therapy Musculoskeletal Self-Management Outcome: 4 (good) Falls in the past year?: No    Psychosocial Psychosocial Symptoms Reported: No symptoms reported Behavioral Health Self-Management Outcome: 4 (good) Major Change/Loss/Stressor/Fears (CP):  Medical condition, self Quality of Family Relationships: helpful Do you feel physically threatened by others?: No      05/04/2024    8:41 PM  Depression screen PHQ 2/9  Decreased Interest 0  Down, Depressed, Hopeless 0  PHQ - 2 Score 0    Vitals:   06/06/24 1134 06/06/24 1137  BP: (!) 150/90 (!) 150/90    Medications Reviewed Today     Reviewed by Randye Buttner, RN (Registered Nurse) on 06/06/24 at 1118  Med List Status: <None>   Medication Order Taking? Sig Documenting Provider Last Dose Status Informant  Accu-Chek FastClix Lancets MISC [id number] Yes TEST BLOOD SUGARS TWO TIMES DAILY. Swaziland, [name], MD  Active   ACCU-CHEK GUIDE TEST test strip [id number] Yes TEST BLOOD SUGAR 1 TO 2 TIMES DAILY [place], [name], MD  Active   albuterol  (VENTOLIN  HFA) 108 [phone] Base) MCG/ACT inhaler [id number] Yes Inhale 2 puffs into the lungs every 6 (six) hours as needed for wheezing or shortness of breath. [place], [name], MD  Active   Alcohol Swabs (B-D SINGLE USE SWABS REGULAR) PADS [id number]  1 Device by Does not apply route daily. [place], [name], MD  Active   allopurinol  (ZYLOPRIM ) 300 MG tablet [id number] Yes TAKE 1 TABLET EVERY DAY [place], [name], MD  Active   amLODipine  (NORVASC ) 10 MG tablet [id number] Yes TAKE 1 TABLET EVERY DAY [place], [name], MD  Active   aspirin  81 MG tablet [id number]  Take 81 mg by mouth every evening.  [provider]  Active Self, Pharmacy Records  empagliflozin  (JARDIANCE ) 25 MG TABS tablet [id number] Yes Take 1 tablet (25 mg total) by mouth daily before  breakfast. [place], [name], MD  Active            Med Note Finley Hugh, Abbagail Scaff A   Thu Sep 02, 2023 10:39 AM) PAP  irbesartan  (AVAPRO ) 150 MG tablet [id number] Yes Take 1 tablet (150 mg total) by mouth daily. [place], [name], MD  Active   metoprolol  succinate (TOPROL -XL) 50 MG 24 hr tablet [id number] Yes TAKE 1 TABLET EVERY DAY WITH A MEAL OR IMMEDIATELY FOLLOWING A MEAL Sw[place], [name], MD  Active   rosuvastatin  (CRESTOR ) 20 MG tablet [id number] Yes TAKE 1 TABLET EVERY DAY [place], [name], MD  Active   Semaglutide , 1 MG/DOSE, 4 MG/3ML SOPN [id number] Yes Inject 1 mg as directed once a week. Use voucher do not bill insurance BIN [id number] PCN CNRX ID [id number] Group [id number] Sw[place], [name], MD  Active             Recommendation:   Acute PCP follow-up 06/12/24 with Dr. Betty Swaziland regarding blood pressures that are trending up and are running 150s-170/90s-100.  Follow Up Plan:   Telephone follow up appointment date/time:  06/27/24 @11am  with RN Case Manager Barbra Ley A. Saverio Curling RN, BA, [facility], CRRN [place]  [facility] Population Health RN Care Manager Direct Dial: [phone]  Fax: [phone]

## 2024-06-08 ENCOUNTER — Telehealth: Payer: Self-pay | Admitting: *Deleted

## 2024-06-08 NOTE — Progress Notes (Signed)
 Complex Care Management Note Care Guide Note  06/08/2024 Name: MARVEN VELEY MRN: [id number] DOB: [birth year]   Complex Care Management Outreach Attempts: An unsuccessful telephone outreach was attempted today to offer the patient information about available complex care management services.  Follow Up Plan:  Additional outreach attempts will be made to offer the patient complex care management information and services.   Encounter Outcome:  No Answer  [place] [facility] HealthPopulation Health Care Guide  Direct Dial:[phone] Fax:[phone] Website: [email or web address]

## 2024-06-09 ENCOUNTER — Telehealth: Payer: Self-pay | Admitting: *Deleted

## 2024-06-09 NOTE — Progress Notes (Signed)
 Complex Care Management Note Care Guide Note  06/09/2024 Name: Charles [name] MRN: [id number] DOB: 14-Feb-1951   Complex Care Management Outreach Attempts: A second unsuccessful outreach was attempted today to offer the patient with information about available complex care management services.  Follow Up Plan:  Additional outreach attempts will be made to offer the patient complex care management information and services.   Encounter Outcome:  No Answer  [place] [facility] HealthPopulation Health Care Guide  Direct Dial:[phone] [phone] Website: [email or web address]

## 2024-06-12 ENCOUNTER — Encounter: Payer: Self-pay | Admitting: Family Medicine

## 2024-06-12 ENCOUNTER — Ambulatory Visit (INDEPENDENT_AMBULATORY_CARE_PROVIDER_SITE_OTHER): Admitting: Family Medicine

## 2024-06-12 VITALS — BP 148/88 | HR 91 | Resp 16 | Ht 65.5 in | Wt 250.1 lb

## 2024-06-12 DIAGNOSIS — R079 Chest pain, unspecified: Secondary | ICD-10-CM

## 2024-06-12 DIAGNOSIS — I499 Cardiac arrhythmia, unspecified: Secondary | ICD-10-CM

## 2024-06-12 DIAGNOSIS — I10 Essential (primary) hypertension: Secondary | ICD-10-CM | POA: Diagnosis not present

## 2024-06-12 DIAGNOSIS — K219 Gastro-esophageal reflux disease without esophagitis: Secondary | ICD-10-CM

## 2024-06-12 MED ORDER — IRBESARTAN 300 MG PO TABS: 300.0000 mg | ORAL_TABLET | Freq: Every day | ORAL | 1 refills | Status: DC

## 2024-06-12 NOTE — Assessment & Plan Note (Signed)
 He reports episodes of heartburn are not very frequent. This problem could be contributing to CP he is reporting today. We can consider adding a PPI if episodes of heartburn get worse  GERD precautions recommended.

## 2024-06-12 NOTE — Progress Notes (Signed)
 HPI: [name] is a 73 y.o. male with a PMHx significant for atherosclerosis of aorta, HTN, DM II, asthma, GERD, HLD, gout, and chronic pain, among others, who is here today to follow up on recent elevated blood pressures.   Pt is on Amlodipine  10 mg daily, Irbesartan  150 mg daily, and metoprolol  succinate 50 mg daily.  Good compliance and tolerance reported.  He has been monitoring his BP at home and reports his BP ranges from 140s-160s/80-90 Last checked his BP this morning; 151/82  His machine also occasionally shows he has irregular rate/rhythm and skipped beats.  When this happens he is  asymptomatic.  He avoids high sodium foods or adding additional salt after cooking.   He also reports intermittent left sided chest pain about 1x/month lasting between 1-3 days at the time.  Describes his pain as sharp and irritation. He suspects the pain may be caused by heartburn.  These episodes occur when lying on his back or side at night or while standing.  He denies symptoms with exertion. CP is not radiated but sometimes he also has left-sided neck pain. No associated dyspnea, palpitation, diaphoresis, dizziness, orthopnea, or PND. He has had these episodes of CP for a while, resolved for a couple months at the time.  Pharmacological stress test with Lexiscan  in 05/2018: Stopped because of shortness of breath within 2 min after test. Stress test done in 08/2015, pharmacologic with Lexiscan : Negative for ST segment deviation, low risk study.  Drinks Alka seltzer or ginger ale to help him belch, which usually helps. He reports episodes of heartburn 1-2 times per month, usually improves with dietary changes.  Last saw cardiology in 03/2021; was told he has to re-establish care.  He has a visit scheduled on 7/24 to establish care with Dr. Michele   Review of Systems  Constitutional:  Negative for activity change, appetite change and fever.  HENT:  Negative for sore throat and  trouble swallowing.   Respiratory:  Negative for cough and wheezing.   Gastrointestinal:  Negative for abdominal pain, nausea and vomiting.  Endocrine: Negative for cold intolerance and heat intolerance.  Genitourinary:  Negative for decreased urine volume, dysuria and hematuria.  Skin:  Negative for rash.  Neurological:  Negative for syncope, weakness and headaches.  See other pertinent positives and negatives in HPI.  Current Outpatient Medications on File Prior to Visit  Medication Sig Dispense Refill   Accu-Chek FastClix Lancets MISC TEST BLOOD SUGARS TWO TIMES DAILY. 204 each 3   ACCU-CHEK GUIDE TEST test strip TEST BLOOD SUGAR 1 TO 2 TIMES DAILY 200 strip 3   albuterol  (VENTOLIN  HFA) 108 (90 Base) MCG/ACT inhaler Inhale 2 puffs into the lungs every 6 (six) hours as needed for wheezing or shortness of breath. 8 g 1   Alcohol Swabs (B-D SINGLE USE SWABS REGULAR) PADS 1 Device by Does not apply route daily. 90 each 3   allopurinol  (ZYLOPRIM ) 300 MG tablet TAKE 1 TABLET EVERY DAY 90 tablet 3   amLODipine  (NORVASC ) 10 MG tablet TAKE 1 TABLET EVERY DAY 90 tablet 3   aspirin  81 MG tablet Take 81 mg by mouth every evening.      empagliflozin  (JARDIANCE ) 25 MG TABS tablet Take 1 tablet (25 mg total) by mouth daily before breakfast. 30 tablet 3   metoprolol  succinate (TOPROL -XL) 50 MG 24 hr tablet TAKE 1 TABLET EVERY DAY WITH A MEAL OR IMMEDIATELY FOLLOWING A MEAL 90 tablet 3   rosuvastatin  (CRESTOR ) 20 MG  tablet TAKE 1 TABLET EVERY DAY 90 tablet 3   Semaglutide , 1 MG/DOSE, 4 MG/3ML SOPN Inject 1 mg as directed once a week. Use voucher do not bill insurance BIN [phone] PCN CNRX ID [id number] Group [id number] 3 mL 0   No current facility-administered medications on file prior to visit.    Past Medical History:  Diagnosis Date   Abnormal carotid ultrasound 07/2015   mild bilateral stenosis, repeat in 1 year.  Dr. Mona   Diabetes mellitus without complication (HCC)    Gout    Hepatitis C  antibody test positive 05/2015   prior infection, resolved   History of cardiovascular stress test 9/16   Lexiscan , EF 55%, no ischemia, Dr. Mona   Hyperlipidemia    Hypertension    Noncompliance    Obesity    PVD (peripheral vascular disease) (HCC) 07/2015   normal ABIs, Dr. Mona   Allergies  Allergen Reactions   Penicillins Other (See Comments)    Per pt blacked out after injection as a child.  Did it involve swelling of the face/tongue/throat, SOB, or low BP? N Did it involve sudden or severe rash/hives, skin peeling, or any reaction on the inside of your mouth or nose? N Did you need to seek medical attention at a hospital or doctor's office? N When did it last happen?   Several Decades Ago    If all above answers are "NO", may proceed with cephalosporin use.     Social History   Socioeconomic History   Marital status: Divorced    Spouse name: Not on file   Number of children: Not on file   Years of education: Not on file   Highest education level: 10th grade  Occupational History   Not on file  Tobacco Use   Smoking status: Former    Current packs/day: 0.00    Average packs/day: 1 pack/day for 30.0 years (30.0 ttl pk-yrs)    Types: Cigarettes    Start date: 07/30/1965    Quit date: 07/31/1995    Years since quitting: 28.8   Smokeless tobacco: Never  Vaping Use   Vaping status: Never Used  Substance and Sexual Activity   Alcohol use: No    Alcohol/week: 0.0 standard drinks of alcohol   Drug use: No   Sexual activity: Not on file  Other Topics Concern   Not on file  Social History Narrative   Not on file   Social Drivers of Health   Financial Resource Strain: High Risk (06/09/2024)   Overall Financial Resource Strain (CARDIA)    Difficulty of Paying Living Expenses: Hard  Food Insecurity: Food Insecurity Present (06/09/2024)   Hunger Vital Sign    Worried About Running Out of Food in the Last Year: Often true    Ran Out of Food in the Last Year: Sometimes  true  Transportation Needs: Unmet Transportation Needs (06/09/2024)   PRAPARE - Transportation    Lack of Transportation (Medical): Yes    Lack of Transportation (Non-Medical): Yes  Physical Activity: Insufficiently Active (06/09/2024)   Exercise Vital Sign    Days of Exercise per Week: 3 days    Minutes of Exercise per Session: 20 min  Stress: Stress Concern Present (06/09/2024)   [employer] of Occupational Health - Occupational Stress Questionnaire    Feeling of Stress: To some extent  Social Connections: Socially Isolated (06/09/2024)   Social Connection and Isolation Panel    Frequency of Communication with Friends and Family: Once a week  Frequency of Social Gatherings with Friends and Family: Never    Attends Religious Services: Never    Database administrator or Organizations: No    Attends Banker Meetings: Not on file    Marital Status: Divorced   Vitals:   06/12/24 0937  BP: (!) 148/88  Pulse: 91  Resp: 16  SpO2: 96%   Body mass index is 40.99 kg/m.  Physical Exam Vitals and nursing note reviewed.  Constitutional:      General: He is not in acute distress.    Appearance: He is well-developed.  HENT:     Head: Normocephalic and atraumatic.   Eyes:     Conjunctiva/sclera: Conjunctivae normal.    Cardiovascular:     Rate and Rhythm: Normal rate. Rhythm irregular. Extrasystoles (x 2 in a minute) are present.    Heart sounds: No murmur heard.    Comments: DP pulses palpable. Pulmonary:     Effort: Pulmonary effort is normal. No respiratory distress.     Breath sounds: Normal breath sounds.  Abdominal:     Palpations: Abdomen is soft. There is no mass.     Tenderness: There is no abdominal tenderness.  Lymphadenopathy:     Cervical: No cervical adenopathy.   Skin:    General: Skin is warm.     Findings: No erythema or rash.   Neurological:     Mental Status: He is alert and oriented to person, place, and time.     Cranial Nerves: No  cranial nerve deficit.     Gait: Gait normal.   Psychiatric:        Mood and Affect: Mood and affect normal.   ASSESSMENT AND PLAN: Mr. ALMANDO BRAWLEY was seen today for follow up on elevated blood pressures.   Orders Placed This Encounter  Procedures   Basic metabolic panel with GFR   EKG 87-Ozji   Chest pain, unspecified type Assessment & Plan: Intermittent episodes of bilateral upper CP, most recently upper left. We discussed possible etiologies, episodes reported today do not seem to be cardiac related. EKG today with no significant changes when compared with prior ones. He has an appointment with cardiologist already scheduled. Clearly instructed about warning signs.  Essential hypertension Assessment & Plan: BP mildly elevated today and similar numbers at home. We discussed possible complications of poorly controlled BP. Recommend increasing dose of irbesartan  from 150 mg to 300 mg. Continue amlodipine  10 mg daily and metoprolol  succinate 50 mg daily. Continue monitoring BP regularly. BMP in 2 to 3 weeks. He has an appointment with cardiologist on 07/13/2024. Instructed about warning signs.  Orders: -     Irbesartan ; Take 1 tablet (300 mg total) by mouth daily.  Dispense: 90 tablet; Refill: 1 -     Basic metabolic panel with GFR; Future  Gastroesophageal reflux disease, unspecified whether esophagitis present Assessment & Plan: He reports episodes of heartburn are not very frequent. This problem could be contributing to CP he is reporting today. We can consider adding a PPI if episodes of heartburn get worse  GERD precautions recommended.  Irregular heart rate Noted at home and today during visit. Last EKG in 03/2024, we contemplated the option of waiting for repeating EKG during his cardiologist's OV but he is concerned about possible acute cardiac event. EKG today SR, LAD, LAE, normal intervals.  No significant changes when compared with EKG done on  03/29/2024. EKG in 11/2021 question of electrode reversal, sinus arrhythmia was present. Clearly instructed about  warning signs.  -     EKG 12-Lead  Return if symptoms worsen or fail to improve, for keep next appointment.  I, Vernell Forest, acting as a scribe for Jeslyn Amsler [place], MD., have documented all relevant documentation on the behalf of Hiedi Touchton [place], MD, as directed by   while in the presence of Elayah Klooster [place], MD.  I, Jeremie Giangrande [place], MD, have reviewed all documentation for this visit. The documentation on 06/12/24 for the exam, diagnosis, procedures, and orders are all accurate and complete.  Raeford Brandenburg G. [place], MD  [facility]. [place] office.

## 2024-06-12 NOTE — Assessment & Plan Note (Addendum)
 BP mildly elevated today and similar numbers at home. We discussed possible complications of poorly controlled BP. Recommend increasing dose of irbesartan  from 150 mg to 300 mg. Continue amlodipine  10 mg daily and metoprolol  succinate 50 mg daily. Continue monitoring BP regularly. BMP in 2 to 3 weeks. He has an appointment with cardiologist on 07/13/2024. Instructed about warning signs.

## 2024-06-12 NOTE — Assessment & Plan Note (Signed)
 Intermittent episodes of bilateral upper CP. We discussed possible etiologies, episodes reported today do not seem to be cardiac related. EKG today with no significant changes when compared with prior ones. He has an appointment with cardiologist already scheduled. Clearly instructed about warning signs.

## 2024-06-12 NOTE — Patient Instructions (Addendum)
 A few things to remember from today's visit:  Chest pain, unspecified type  Essential hypertension - Plan: irbesartan  (AVAPRO ) 300 MG tablet  Gastroesophageal reflux disease, unspecified whether esophagitis present  Irregular heart rate - Plan: EKG 12-Lead Irbesartan  dose increased from 150 mg to 300 mg. Rest unchanged. Keep appt with cardiologist. Lab in 2-3 weeks.  If you need refills for medications you take chronically, please call your pharmacy. Do not use My Chart to request refills or for acute issues that need immediate attention. If you send a my chart message, it may take a few days to be addressed, specially if I am not in the office.  Please be sure medication list is accurate. If a new problem present, please set up appointment sooner than planned today.

## 2024-06-13 ENCOUNTER — Telehealth: Payer: Self-pay | Admitting: *Deleted

## 2024-06-13 NOTE — Progress Notes (Signed)
 Complex Care Management Note Care Guide Note  06/13/2024 Name: Charles [name] MRN: [id number] DOB: 1951/01/01  Charles [name] is a 73 y.o. year old male who is a primary care patient of [place], Dickey MATSU, MD . The community resource team was consulted for assistance with Transportation Needs  and Food Insecurity  SDOH screenings and interventions completed:  Yes     SDOH Interventions Today    Flowsheet Row Most Recent Value  SDOH Interventions   Food Insecurity Interventions Community Resources Provided, Assist with [employer] food banks in the area , and also I will send food stamp application]  Transportation Interventions --  [Mailed [place] access]  Social Connections Interventions Community Resources Provided  [employer] mail newsletter to patient on senior centers]     Care guide performed the following interventions: Patient provided with information about care guide support team and interviewed to confirm resource needs.  Follow Up Plan:  No further follow up planned at this time. The patient has been provided with needed resources.  Encounter Outcome:  Patient Visit Completed Khala Tarte Greenauer-Moran  [facility] HealthPopulation Health Care Guide  Direct Dial:[phone] Fax:[phone] Website: [email or web address]

## 2024-06-27 ENCOUNTER — Other Ambulatory Visit: Payer: Self-pay

## 2024-06-28 NOTE — Patient Outreach (Signed)
 Complex Care Management   Visit Note  06/28/2024  Name:  Charles [name] MRN: [id number] DOB: 1951-05-25  Situation: Referral received for Complex Care Management related to Hypertension and Type 2 Diabetes. I obtained verbal consent from Patient.  Visit completed with patient  on the phone  Background:   Past Medical History:  Diagnosis Date   Abnormal carotid ultrasound 07/2015   mild bilateral stenosis, repeat in 1 year.  Dr. Mona   Diabetes mellitus without complication (HCC)    Gout    Hepatitis C antibody test positive 05/2015   prior infection, resolved   History of cardiovascular stress test 9/16   Lexiscan , EF 55%, no ischemia, Dr. Mona   Hyperlipidemia    Hypertension    Noncompliance    Obesity    PVD (peripheral vascular disease) (HCC) 07/2015   normal ABIs, Dr. Mona    Assessment: Patient Reported Symptoms:  Cognitive Cognitive Status: Alert and oriented to person, place, and time, Insightful and able to interpret abstract concepts, Normal speech and language skills Cognitive/Intellectual Conditions Management [RPT]: None reported or documented in medical history or problem list   Health Maintenance Behaviors: Annual physical exam, Healthy diet Health Facilitated by: Healthy diet, Rest  Neurological Neurological Review of Symptoms: No symptoms reported    HEENT HEENT Symptoms Reported: No symptoms reported, Other: (recent R cataract ssurgery with great results) HEENT Management Strategies: Coping strategies, Routine screening, Adequate rest HEENT Self-Management Outcome: 4 (good) Vision problem(s)  Cardiovascular Cardiovascular Symptoms Reported: Other: Other Cardiovascular Symptoms: After seeing his PCP, Dr. [place] for BPs that were trending up into the 150s-160/90s-100, patient's Avapro  was increased from 150mg  daily to 300mg  daily - 2 weeks later, patient states he has seen virtually no difference in his BP readings - advised patient to call his PCP after we  finished our conversation and ask to speak to PCP's triage nurse to report no difference in BP readings and to ask for PCP's further instructions Does patient have uncontrolled Hypertension?: Yes Is patient checking Blood Pressure at home?: Yes Patient's Recent BP reading at home: 150s-160/90s - 100 Cardiovascular Management Strategies: Medication therapy, Routine screening, Coping strategies, Adequate rest, Activity, Weight management, Diet modification Do You Have [name] Working Readable Scale?: Yes Cardiovascular Self-Management Outcome: 3 (uncertain) Cardiovascular Comment: referred to Cardiologist by PCP - has new Patient appt with Dr. Michele on 7/24. Also advised patient to report no change in elevated BPs since RX Avapro  daily dose was doubled  Respiratory Respiratory Symptoms Reported: No symptoms reported Respiratory Self-Management Outcome: 4 (good)  Endocrine Endocrine Symptoms Reported: No symptoms reported Is patient diabetic?: Yes Is patient checking blood sugars at home?: Yes List most recent blood sugar readings, include date and time of day: AM blood sugar = 114 06/27/24 Endocrine Self-Management Outcome: 4 (good) Endocrine Comment: 03/29/24 A1c = 6.3 (at goal <7)  Gastrointestinal Gastrointestinal Symptoms Reported: No symptoms reported Gastrointestinal Management Strategies: Diet modification Gastrointestinal Self-Management Outcome: 4 (good) Nutrition Risk Screen (CP): No indicators present  Genitourinary Genitourinary Symptoms Reported: No symptoms reported Genitourinary Self-Management Outcome: 4 (good)  Integumentary Integumentary Symptoms Reported: No symptoms reported    Musculoskeletal Musculoskelatal Symptoms Reviewed: No symptoms reported Musculoskeletal Management Strategies: Coping strategies, Diet modification, Medication therapy Musculoskeletal Self-Management Outcome: 4 (good) Falls in the past year?: No    Psychosocial Psychosocial Symptoms Reported: No symptoms  reported Behavioral Health Self-Management Outcome: 4 (good) Major Change/Loss/Stressor/Fears (CP): Medical condition, self Quality of Family Relationships: helpful Do you feel physically threatened by others?:  No      05/04/2024    8:41 PM  Depression screen PHQ 2/9  Decreased Interest 0  Down, Depressed, Hopeless 0  PHQ - 2 Score 0    Vitals:   06/27/24 1124  BP: (!) 151/90  Pulse: 92    Medications Reviewed Today     Reviewed by Charles [name] LABOR, RN (Registered Nurse) on 06/27/24 at 1125  Med List Status: <None>   Medication Order Taking? Sig Documenting Provider Last Dose Status Informant  Accu-Chek FastClix Lancets MISC [id number] Yes TEST BLOOD SUGARS TWO TIMES DAILY. Charles [name], [name], MD  Active   ACCU-CHEK GUIDE TEST test strip [id number] Yes TEST BLOOD SUGAR 1 TO 2 TIMES DAILY Charles [name], [name], MD  Active   albuterol  (VENTOLIN  HFA) 108 [phone] Base) MCG/ACT inhaler [id number] Yes Inhale 2 puffs into the lungs every 6 (six) hours as needed for wheezing or shortness of breath. Charles [name], [name], MD  Active   Alcohol Swabs (B-D SINGLE USE SWABS REGULAR) PADS [id number] Yes 1 Device by Does not apply route daily. Charles [name], [name], MD  Active   allopurinol  (ZYLOPRIM ) 300 MG tablet [id number] Yes TAKE 1 TABLET EVERY DAY Charles [name], [name], MD  Active   amLODipine  (NORVASC ) 10 MG tablet [id number] Yes TAKE 1 TABLET EVERY DAY Charles [name], [name], MD  Active   aspirin  81 MG tablet [id number] Yes Take 81 mg by mouth every evening.  [provider]  Active Self, Pharmacy Records  empagliflozin  (JARDIANCE ) 25 MG TABS tablet [id number] Yes Take 1 tablet (25 mg total) by mouth daily before breakfast. Charles [name], [name], MD  Active            Med Note Charles [name], Charles [name]   Thu Sep 02, 2023 10:39 AM) PAP  irbesartan  (AVAPRO ) 300 MG tablet [id number] Yes Take 1 tablet (300 mg total) by mouth daily. Charles [name], [name], MD  Active   metoprolol  succinate (TOPROL -XL) 50 MG 24 hr tablet [id number] Yes TAKE 1 TABLET  EVERY DAY WITH [name] MEAL OR IMMEDIATELY FOLLOWING [name] MEAL Charles [name], [name], MD  Active   rosuvastatin  (CRESTOR ) 20 MG tablet [id number] Yes TAKE 1 TABLET EVERY DAY Charles [name], [name], MD  Active   Semaglutide , 1 MG/DOSE, 4 MG/3ML SOPN [id number] Yes Inject 1 mg as directed once [name] week. Use voucher do not bill insurance BIN [id number] PCN CNRX ID [id number] Group [id number] Charles [name], [name], MD  Active             Recommendation:   Please call PCP's office and ask to speak to PCP's triage nurse to report no difference in BP readings and to ask for PCP's further instructions  Specialty provider follow-up with Cardiologist Dr. Michele on 07/13/24  Follow Up Plan:   Telephone follow up appointment date/time:  07/14/24 @ 10:30 am  [name] [name]. Gordy RN, BA, [facility], CRRN [place]  [facility] Population Health RN Care Manager Direct Dial: [phone]  Fax: [phone]

## 2024-06-28 NOTE — Patient Instructions (Signed)
 Visit Information  Thank you for taking time to visit with me today. Please don't hesitate to contact me if I can be of assistance to you before our next scheduled telephone appointment.  Our next appointment is by telephone on 07/14/24 at 10:30 AM  Following is a copy of your care plan:   Goals Addressed             This Visit's Progress    VBCI RN Care Plan       Problems:  Chronic Disease Management support and education needs related to DMII  Goal: Over the next 4 months the Patient will continue to work with RN Care Manager and/or Social Worker to address care management and care coordination needs related to DMII as evidenced by adherence to care management team scheduled appointments      Interventions:   Diabetes Interventions: Assessed patient's understanding of A1c goal: <7% Provided education to patient about basic DM disease process Reviewed medications with patient and discussed importance of medication adherence Counseled on importance of regular laboratory monitoring as prescribed Discussed plans with patient for ongoing care management follow up and provided patient with direct contact information for care management team Review of patient status, including review of consultants reports, relevant laboratory and other test results, and medications completed Screening for signs and symptoms of depression related to chronic disease state  Assessed social determinant of health barriers Lab Results  Component Value Date   HGBA1C 6.3 03/29/2024    Patient Self-Care Activities:  Attend all scheduled provider appointments Call pharmacy for medication refills 3-7 days in advance of running out of medications Call provider office for new concerns or questions  Perform all self care activities independently  Perform IADL's (shopping, preparing meals, housekeeping, managing finances) independently Take medications as prescribed    Plan:  The patient has been provided  with contact information for the care management team and has been advised to call with any health related questions or concerns.  Next RNCM follow up to discuss management of diabetes is 07/14/2024 at 10:30AM          VBCI RN Care Plan       Problems:  Chronic Disease Management support and education needs related to HTN  Goal: Over the next 4 months the Patient will continue to work with RN Care Manager and/or Social Worker to address care management and care coordination needs related to HTN as evidenced by adherence to care management team scheduled appointments      Interventions:   Hypertension Interventions: Last practice recorded BP readings:  BP Readings from Last 3 Encounters:  03/29/24 138/80  11/24/23 138/82  06/09/23 136/80   Most recent eGFR/CrCl: No results found for: EGFR  No components found for: CRCL  Evaluation of current treatment plan related to hypertension self management and patient's adherence to plan as established by provider Provided education to patient re: stroke prevention, s/s of heart attack and stroke Reviewed medications with patient and discussed importance of compliance Counseled on the importance of exercise goals with target of 150 minutes per week Discussed plans with patient for ongoing care management follow up and provided patient with direct contact information for care management team Advised patient, providing education and rationale, to monitor blood pressure daily and record, calling PCP for findings outside established parameters Discussed complications of poorly controlled blood pressure such as heart disease, stroke, circulatory complications, vision complications, kidney impairment, sexual dysfunction Screening for signs and symptoms of depression related to chronic  disease state  Assessed social determinant of health barriers  Patient Self-Care Activities:  Attend all scheduled provider appointments Call pharmacy for  medication refills 3-7 days in advance of running out of medications Call provider office for new concerns or questions  Perform all self care activities independently  Perform IADL's (shopping, preparing meals, housekeeping, managing finances) independently Take medications as prescribed   keep a blood pressure log take blood pressure log to all doctor appointments take medications for blood pressure exactly as prescribed begin an exercise program report new symptoms to your doctor  Plan:  The patient has been provided with contact information for the care management team and has been advised to call with any health related questions or concerns.  Patient to see Dr. [place] regarding his recent Blood Pressure readings trending upwards 150s - 170s/90s - 100s on 6/23/ 2025 @ 930am  Next RNCM Follow up to review ongoing goal of keeping blood pressure under control is 07/14/2024 @ 10:30 am            Patient verbalizes understanding of instructions and care plan provided today and agrees to view in MyChart. Active MyChart status and patient understanding of how to access instructions and care plan via MyChart confirmed with patient.     The patient has been provided with contact information for the care management team and has been advised to call with any health related questions or concerns.   Please call the care guide team at [phone] if you need to cancel or reschedule your appointment.   Please call 1-800-273-TALK (toll free, 24 hour hotline) if you are experiencing a Mental Health or Behavioral Health Crisis or need someone to talk to.  Stoney Karczewski A. Gordy RN, BA, [facility], CRRN [place]  [facility] Population Health RN Care Manager Direct Dial: [phone]  Fax: [phone]

## 2024-07-03 ENCOUNTER — Other Ambulatory Visit

## 2024-07-03 ENCOUNTER — Other Ambulatory Visit (INDEPENDENT_AMBULATORY_CARE_PROVIDER_SITE_OTHER)

## 2024-07-03 DIAGNOSIS — I10 Essential (primary) hypertension: Secondary | ICD-10-CM

## 2024-07-03 LAB — BASIC METABOLIC PANEL WITH GFR
BUN: 16 mg/dL (ref 6–23)
CO2: 23 meq/L (ref 19–32)
Calcium: 9.7 mg/dL (ref 8.4–10.5)
Chloride: 105 meq/L (ref 96–112)
Creatinine, Ser: 1.32 mg/dL (ref 0.40–1.50)
GFR: 53.89 mL/min — ABNORMAL LOW (ref >60.00)
Glucose, Bld: 109 mg/dL — ABNORMAL HIGH (ref 70–99)
Potassium: 3.5 meq/L (ref 3.5–5.1)
Sodium: 139 meq/L (ref 135–145)

## 2024-07-04 ENCOUNTER — Ambulatory Visit: Payer: Self-pay | Admitting: Family Medicine

## 2024-07-13 ENCOUNTER — Other Ambulatory Visit (HOSPITAL_COMMUNITY): Payer: Self-pay

## 2024-07-13 ENCOUNTER — Ambulatory Visit: Attending: Cardiology | Admitting: Cardiology

## 2024-07-13 ENCOUNTER — Encounter: Payer: Self-pay | Admitting: Cardiology

## 2024-07-13 VITALS — BP 142/86 | HR 96 | Resp 16 | Ht 65.0 in | Wt 246.4 lb

## 2024-07-13 DIAGNOSIS — I700 Atherosclerosis of aorta: Secondary | ICD-10-CM | POA: Diagnosis not present

## 2024-07-13 DIAGNOSIS — I10 Essential (primary) hypertension: Secondary | ICD-10-CM

## 2024-07-13 DIAGNOSIS — E1122 Type 2 diabetes mellitus with diabetic chronic kidney disease: Secondary | ICD-10-CM

## 2024-07-13 DIAGNOSIS — R072 Precordial pain: Secondary | ICD-10-CM | POA: Diagnosis not present

## 2024-07-13 DIAGNOSIS — H18413 Arcus senilis, bilateral: Secondary | ICD-10-CM | POA: Diagnosis not present

## 2024-07-13 DIAGNOSIS — N1831 Chronic kidney disease, stage 3a: Secondary | ICD-10-CM | POA: Diagnosis not present

## 2024-07-13 MED ORDER — METOPROLOL TARTRATE 50 MG PO TABS
50.0000 mg | ORAL_TABLET | Freq: Two times a day (BID) | ORAL | 0 refills | Status: DC
  Filled 2024-07-13: qty 14, 7d supply, fill #0

## 2024-07-13 NOTE — Patient Instructions (Addendum)
 Medication Instructions:  Beginning 1 week prior to coronary CTA:  -STOP Metoprolol  Succinate (Toprol -XL) -START Metoprolol  Tartrate (Lopressor ) 50 mg twice daily until the scan is completed -ONCE SCAN IS DONE, switch back to taking your Metoprolol  Succinate (Toprol -XL) once daily  *If you need a refill on your cardiac medications before your next appointment, please call your pharmacy*  Lab Work: If your coronary CTA is scheduled after 08/03/24, you will need a repeat BMP to be completed. Lab order has already been placed just in case.  If you have labs (blood work) drawn today and your tests are completely normal, you will receive your results only by: MyChart Message (if you have MyChart) OR A paper copy in the mail If you have any lab test that is abnormal or we need to change your treatment, we will call you to review the results.  Testing/Procedures: Your provider has requested that you have a coronary CTA. Non-Cardiac CT Angiography (CTA), is a special type of CT scan that uses a computer to produce multi-dimensional views of major blood vessels throughout the body. In CT angiography, a contrast material is injected through an IV to help visualize the blood vessels.  Your physician has requested that you have an echocardiogram. Echocardiography is a painless test that uses sound waves to create images of your heart. It provides your doctor with information about the size and shape of your heart and how well your heart's chambers and valves are working. This procedure takes approximately one hour. There are no restrictions for this procedure. Please do NOT wear cologne, perfume, aftershave, or lotions (deodorant is allowed). Please arrive 15 minutes prior to your appointment time.  Please note: We ask at that you not bring children with you during ultrasound (echo/ vascular) testing. Due to room size and safety concerns, children are not allowed in the ultrasound rooms during exams. Our  front office staff cannot provide observation of children in our lobby area while testing is being conducted. An adult accompanying a patient to their appointment will only be allowed in the ultrasound room at the discretion of the ultrasound technician under special circumstances. We apologize for any inconvenience.    Follow-Up: At [facility], you and your health needs are our priority.  As part of our continuing mission to provide you with exceptional heart care, our providers are all part of one team.  This team includes your primary Cardiologist (physician) and Advanced Practice Providers or APPs (Physician Assistants and Nurse Practitioners) who all work together to provide you with the care you need, when you need it.  Your next appointment:   6 month(s)  Provider:   Madonna Large, DO    We recommend signing up for the patient portal called MyChart.  Sign up information is provided on this After Visit Summary.  MyChart is used to connect with patients for Virtual Visits (Telemedicine).  Patients are able to view lab/test results, encounter notes, upcoming appointments, etc.  Non-urgent messages can be sent to your provider as well.   To learn more about what you can do with MyChart, go to [email or web address].   Other Instructions   Your cardiac CT will be scheduled at one of the below locations:   [facility] [address] [place], [place] [address] [phone]  Please follow these instructions carefully (unless otherwise directed):  An IV will be required for this test and Nitroglycerin will be given.  Hold all erectile dysfunction medications at least 3  days (72 hrs) prior to test. (Ie viagra, cialis, sildenafil, tadalafil, etc)   On the Night Before the Test: Be sure to Drink plenty of water. Do not consume any caffeinated/decaffeinated beverages or chocolate 12 hours prior to your test. Do not take any antihistamines 12 hours prior  to your test.  On the Day of the Test: Drink plenty of water until 1 hour prior to the test. Do not eat any food 1 hour prior to test. You may take your regular medications prior to the test.  Take metoprolol  (Lopressor ) 50 mg two hours prior to test.       After the Test: Drink plenty of water. After receiving IV contrast, you may experience a mild flushed feeling. This is normal. On occasion, you may experience a mild rash up to 24 hours after the test. This is not dangerous. If this occurs, you can take Benadryl 25 mg and increase your fluid intake. If you experience trouble breathing, this can be serious. If it is severe call 911 IMMEDIATELY. If it is mild, please call our office.  We will call to schedule your test 2-4 weeks out understanding that some insurance companies will need an authorization prior to the service being performed.   For more information and frequently asked questions, please visit our website : [email or web address]  For non-scheduling related questions, please contact the cardiac imaging nurse navigator should you have any questions/concerns: Cardiac Imaging Nurse Navigators Direct Office Dial: [phone]   For scheduling needs, including cancellations and rescheduling, please call [place], [phone].

## 2024-07-13 NOTE — Progress Notes (Signed)
 Cardiology Office Note:     NAME:  Charles [name]    MRN: [id number] DOB:  [birth year]   PCP:  [place], [name], MD  Former Cardiology Providers: Dr. Mona Primary Cardiologist:  Madonna Large, DO, [facility] (established care 07/13/2024) Electrophysiologist:  None   Referring MD: [place], [name], MD  Reason of Consult: Chest pain  Chief Complaint  Patient presents with   Chest Pain   New Patient (Initial Visit)    History of Present Illness:    Charles [name] is a 73 y.o. African-American male whose past medical history and cardiovascular risk factors includes: Hypertension, diabetes mellitus type 2, aortic atherosclerosis, hyperlipidemia, GERD, asthma,. He is being seen today for the evaluation of chest pain at the request of [place], Dickey MATSU, MD.  Patient was referred to practice for evaluation of chest pain.  Formally under the care of Dr. Mona.  Precordial pain: Located substernally/left-sided. Last episode couple weeks ago. Intensity 7 out of 10. Duration: Minutes. Feels like a heavy sensation in the chest. No improving or worsening factors. Self-limited. Not brought on by effort related activities, does not resolve with rest.  Patient has been a diabetic since 1996.  Current Medications: Current Meds  Medication Sig   Accu-Chek FastClix Lancets MISC TEST BLOOD SUGARS TWO TIMES DAILY.   ACCU-CHEK GUIDE TEST test strip TEST BLOOD SUGAR 1 TO 2 TIMES DAILY   albuterol  (VENTOLIN  HFA) 108 (90 Base) MCG/ACT inhaler Inhale 2 puffs into the lungs every 6 (six) hours as needed for wheezing or shortness of breath.   allopurinol  (ZYLOPRIM ) 300 MG tablet TAKE 1 TABLET EVERY DAY   amLODipine  (NORVASC ) 10 MG tablet TAKE 1 TABLET EVERY DAY   aspirin  81 MG tablet Take 81 mg by mouth every evening.    empagliflozin  (JARDIANCE ) 25 MG TABS tablet Take 1 tablet (25 mg total) by mouth daily before breakfast.   irbesartan  (AVAPRO ) 300 MG tablet Take 1 tablet (300 mg total) by mouth daily.    metoprolol  succinate (TOPROL -XL) 50 MG 24 hr tablet TAKE 1 TABLET EVERY DAY WITH A MEAL OR IMMEDIATELY FOLLOWING A MEAL   metoprolol  tartrate (LOPRESSOR ) 50 MG tablet Take 1 tablet (50 mg total) by mouth 2 (two) times daily. Take 90-120 minutes prior to scan. HOLD if systolic blood pressure (top number) is less than 100 and/ or heart rate is less than 55. STOP this medication and continue your original Metoprolol  Succinate (Toprol -XL) once CT scan is completed.   rosuvastatin  (CRESTOR ) 20 MG tablet TAKE 1 TABLET EVERY DAY   Semaglutide , 1 MG/DOSE, 4 MG/3ML SOPN Inject 1 mg as directed once a week. Use voucher do not bill insurance BIN [phone] PCN CNRX ID [id number] Group [id number]     Allergies:    Penicillins   Past Medical History: Past Medical History:  Diagnosis Date   Abnormal carotid ultrasound 07/2015   mild bilateral stenosis, repeat in 1 year.  Dr. Mona   Diabetes mellitus without complication (HCC)    Gout    Hepatitis C antibody test positive 05/2015   prior infection, resolved   History of cardiovascular stress test 9/16   Lexiscan , EF 55%, no ischemia, Dr. Mona   Hyperlipidemia    Hypertension    Noncompliance    Obesity    PVD (peripheral vascular disease) (HCC) 07/2015   normal ABIs, Dr. Mona    Past Surgical History: Past Surgical History:  Procedure Laterality Date   BALLOON ANGIOPLASTY, ARTERY N/A also placed stent  COLONOSCOPY  2016   Dr. Rollin    Social History: Social History   Tobacco Use   Smoking status: Former    Current packs/day: 0.00    Average packs/day: 1 pack/day for 30.0 years (30.0 ttl pk-yrs)    Types: Cigarettes    Start date: 07/30/1965    Quit date: 07/31/1995    Years since quitting: 28.9   Smokeless tobacco: Never  Vaping Use   Vaping status: Never Used  Substance Use Topics   Alcohol use: No    Alcohol/week: 0.0 standard drinks of alcohol   Drug use: No    Family History: Family History  Problem Relation Age of Onset    Asthma Mother    Heart attack Mother    Hypertension Mother    Hyperlipidemia Mother    Hypertension Father    Diabetes Father    HIV Sister    Liver disease Brother    Lung cancer Brother    Hypertension Brother     ROS:   Review of Systems  Cardiovascular:  Positive for chest pain. Negative for claudication, irregular heartbeat, leg swelling, near-syncope, orthopnea, palpitations, paroxysmal nocturnal dyspnea and syncope.  Respiratory:  Negative for shortness of breath.   Hematologic/Lymphatic: Negative for bleeding problem.    EKGs/Labs/Other Studies Reviewed:   EKG: EKG Interpretation Date/Time:  Thursday July 13 2024 11:32:38 EDT Ventricular Rate:  97 PR Interval:  176 QRS Duration:  78 QT Interval:  358 QTC Calculation: 454 R Axis:   247  Text Interpretation: Normal sinus rhythm Possible Left atrial enlargement Right superior axis deviation Consider Anterior infarct , age undetermined When compared with ECG of 01-Dec-2021 22:22, Since last tracing rate faster Confirmed by Michele Richardson [phone]) on 07/13/2024 12:01:09 PM  Echocardiogram: N/A  Stress Testing:  June 2019 nuclear stress test: Low risk study  Labs:    Latest Ref Rng & Units 12/16/2022   10:14 AM 05/25/2022    1:48 PM 12/01/2021    2:37 PM  CBC  WBC 4.0 - 10.5 K/uL 7.5  12.4  9.0   Hemoglobin 13.0 - 17.0 g/dL 86.2  86.9  86.4   Hematocrit 39.0 - 52.0 % 43.8  43.5  45.6   Platelets 150.0 - 400.0 K/uL 205.0  186  210        Latest Ref Rng & Units 07/03/2024   10:09 AM 04/19/2024    9:12 AM 11/24/2023   10:23 AM  BMP  Glucose 70 - 99 mg/dL 890  867  95   BUN 6 - 23 mg/dL 16  19  19    Creatinine 0.40 - 1.50 mg/dL 8.67  8.52  8.67   Sodium 135 - 145 mEq/L 139  139  141   Potassium 3.5 - 5.1 mEq/L 3.5  3.8  3.9   Chloride 96 - 112 mEq/L 105  104  106   CO2 19 - 32 mEq/L 23  23  26    Calcium  8.4 - 10.5 mg/dL 9.7  9.4  9.8       Latest Ref Rng & Units 07/03/2024   10:09 AM 04/19/2024    9:12 AM  11/24/2023   10:23 AM  CMP  Glucose 70 - 99 mg/dL 890  867  95   BUN 6 - 23 mg/dL 16  19  19    Creatinine 0.40 - 1.50 mg/dL 8.67  8.52  8.67   Sodium 135 - 145 mEq/L 139  139  141   Potassium 3.5 - 5.1 mEq/L  3.5  3.8  3.9   Chloride 96 - 112 mEq/L 105  104  106   CO2 19 - 32 mEq/L 23  23  26    Calcium  8.4 - 10.5 mg/dL 9.7  9.4  9.8   Total Protein 6.0 - 8.3 g/dL   7.9   Total Bilirubin 0.2 - 1.2 mg/dL   0.5   Alkaline Phos 39 - 117 U/L   117   AST 0 - 37 U/L   26   ALT 0 - 53 U/L   27     Lab Results  Component Value Date   CHOL 121 11/24/2023   HDL 39.80 11/24/2023   LDLCALC 55 11/24/2023   TRIG 132.0 11/24/2023   CHOLHDL 3 11/24/2023   No results for input(s): LIPOA in the last 8760 hours. No components found for: NTPROBNP No results for input(s): PROBNP in the last 8760 hours. No results for input(s): TSH in the last 8760 hours.  Physical Exam:    Today's Vitals   07/13/24 1134  BP: (!) 142/86  Pulse: 96  Resp: 16  SpO2: 96%  Weight: 246 lb 6.4 oz (111.8 kg)  Height: 5' 5 (1.651 m)   Body mass index is 41 kg/m. Wt Readings from Last 3 Encounters:  07/13/24 246 lb 6.4 oz (111.8 kg)  06/12/24 250 lb 2 oz (113.5 kg)  03/29/24 254 lb (115.2 kg)    Physical Exam  Constitutional: No distress.  hemodynamically stable  HENT:  Arcus senilis bilaterally  Neck: No JVD present.  Cardiovascular: Normal rate, regular rhythm, S1 normal and S2 normal. Exam reveals no gallop, no S3 and no S4.  No murmur heard. Pulmonary/Chest: Effort normal and breath sounds normal. No stridor. He has no wheezes. He has no rales.  Musculoskeletal:        General: No edema.     Cervical back: Neck supple.  Skin: Skin is warm.     Impression & Recommendation(s):  Impression:   ICD-10-CM   1. Precordial pain  R07.2 EKG 12-Lead    ECHOCARDIOGRAM COMPLETE    CT CORONARY MORPH W/CTA COR W/SCORE W/CA W/CM &/OR WO/CM    metoprolol  tartrate (LOPRESSOR ) 50 MG tablet    Basic  metabolic panel with GFR    Basic metabolic panel with GFR    2. Type 2 diabetes mellitus with stage 3a chronic kidney disease, without long-term current use of insulin  (HCC)  E11.22    N18.31     3. Essential hypertension  I10 Basic metabolic panel with GFR    Basic metabolic panel with GFR    4. Atherosclerosis of aorta (HCC)  I70.0     5. Arcus senilis of both eyes  H18.413        Recommendation(s):  Precordial pain Cardiac and noncardiac symptoms. Likely not having classic anginal discomfort due to prolonged diabetes. Multiple cardiovascular risk factors. EKG is nonischemic. Echo will be ordered to evaluate for structural heart disease and left ventricular systolic function. Coronary CTA to evaluate for CAC, plaque burden, and obstructive disease A week prior to his coronary CTA recommended holding Toprol -XL 50 mg p.o. daily and starting Lopressor  50 mg p.o. twice daily.  After the coronary CTA is complete he can go back to his Toprol -XL. Reemphasized importance of improving his modifiable cardiovascular risk factors.  Type 2 diabetes mellitus with stage 3a chronic kidney disease, without long-term current use of insulin  (HCC) Most recent hemoglobin A1c 6.3 as of April 2025, well-controlled Diabetic since nearly  1996. Currently on Jardiance , Avapro , Crestor , Ozempic   Essential hypertension Office blood pressures are acceptable. Continue amlodipine  10 mg p.o. daily. Continue Avapro  300 mg p.o. daily. Continue Toprol -XL 50 mg p.o. daily Will defer uptitration of antihypertensive medications to PCP. In the interim, patient is advised to check blood pressures more often at home to better evaluate his ambulatory trends  Atherosclerosis of aorta (HCC) Arcus senilis of both eyes Currently on antiplatelets as well as statin therapy  Orders Placed:  Orders Placed This Encounter  Procedures   CT CORONARY MORPH W/CTA COR W/SCORE W/CA W/CM &/OR WO/CM    Standing Status:    Future    Expiration Date:   07/13/2025    If indicated for the ordered procedure, I authorize the administration of contrast media per Radiology protocol:   Yes    Initiate Coronary CTA Adult Protocol:   Yes    If indicated initiate Post Coronary CTA Hypotension Adult Protocol:   Yes    Does the patient have a contrast media/X-ray dye allergy?:   No    [facility]    Authorization::   FFR will be ordered if deemed medically necessary   Basic metabolic panel with GFR    Standing Status:   Future    Number of Occurrences:   1    Expected Date:   07/20/2024    Expiration Date:   07/13/2025   EKG 12-Lead   ECHOCARDIOGRAM COMPLETE    Standing Status:   Future    Expected Date:   07/20/2024    Expiration Date:   07/13/2025    Where [facility]    Does the patient weigh less than or greater than 250 lbs?:   Patient weighs less than 250 lbs    Perflutren DEFINITY (image enhancing agent) should be administered unless hypersensitivity or allergy exist:   Administer Perflutren    Reason for exam-Echo:   Chest Pain  R07.9     Final Medication List:    Meds ordered this encounter  Medications   metoprolol  tartrate (LOPRESSOR ) 50 MG tablet    Sig: Take 1 tablet (50 mg total) by mouth 2 (two) times daily. Take 90-120 minutes prior to scan. HOLD if systolic blood pressure (top number) is less than 100 and/ or heart rate is less than 55. STOP this medication and continue your original Metoprolol  Succinate (Toprol -XL) once CT scan is completed.    Dispense:  14 tablet    Refill:  0    There are no discontinued medications.   Current Outpatient Medications:    Accu-Chek FastClix Lancets MISC, TEST BLOOD SUGARS TWO TIMES DAILY., Disp: 204 each, Rfl: 3   ACCU-CHEK GUIDE TEST test strip, TEST BLOOD SUGAR 1 TO 2 TIMES DAILY, Disp: 200 strip, Rfl: 3   albuterol  (VENTOLIN  HFA) 108 (90 Base) MCG/ACT inhaler, Inhale 2  puffs into the lungs every 6 (six) hours as needed for wheezing or shortness of breath., Disp: 8 g, Rfl: 1   allopurinol  (ZYLOPRIM ) 300 MG tablet, TAKE 1 TABLET EVERY DAY, Disp: 90 tablet, Rfl: 3   amLODipine  (NORVASC ) 10 MG tablet, TAKE 1 TABLET EVERY DAY, Disp: 90 tablet, Rfl: 3   aspirin  81 MG tablet, Take 81 mg by mouth every evening. , Disp: , Rfl:    empagliflozin  (JARDIANCE ) 25 MG TABS tablet, Take 1 tablet (25 mg total) by mouth daily before breakfast., Disp: 30 tablet,  Rfl: 3   irbesartan  (AVAPRO ) 300 MG tablet, Take 1 tablet (300 mg total) by mouth daily., Disp: 90 tablet, Rfl: 1   metoprolol  succinate (TOPROL -XL) 50 MG 24 hr tablet, TAKE 1 TABLET EVERY DAY WITH A MEAL OR IMMEDIATELY FOLLOWING A MEAL, Disp: 90 tablet, Rfl: 3   metoprolol  tartrate (LOPRESSOR ) 50 MG tablet, Take 1 tablet (50 mg total) by mouth 2 (two) times daily. Take 90-120 minutes prior to scan. HOLD if systolic blood pressure (top number) is less than 100 and/ or heart rate is less than 55. STOP this medication and continue your original Metoprolol  Succinate (Toprol -XL) once CT scan is completed., Disp: 14 tablet, Rfl: 0   rosuvastatin  (CRESTOR ) 20 MG tablet, TAKE 1 TABLET EVERY DAY, Disp: 90 tablet, Rfl: 3   Semaglutide , 1 MG/DOSE, 4 MG/3ML SOPN, Inject 1 mg as directed once a week. Use voucher do not bill insurance BIN [id number] PCN CNRX ID [id number] Group [id number], Disp: 3 mL, Rfl: 0   Alcohol Swabs (B-D SINGLE USE SWABS REGULAR) PADS, 1 Device by Does not apply route daily., Disp: 90 each, Rfl: 3  Consent:   N/A  Disposition:   6 months or sooner if needed  His questions and concerns were addressed to his satisfaction. He voices understanding of the recommendations provided during this encounter.    Signed, Madonna Michele HAS, [facility]  A Division of [facility] [address] [place], [place] [address]  [place], [place] [id number]

## 2024-07-14 ENCOUNTER — Other Ambulatory Visit: Payer: Self-pay

## 2024-07-14 DIAGNOSIS — R079 Chest pain, unspecified: Secondary | ICD-10-CM

## 2024-07-14 DIAGNOSIS — I10 Essential (primary) hypertension: Secondary | ICD-10-CM

## 2024-07-14 DIAGNOSIS — E1169 Type 2 diabetes mellitus with other specified complication: Secondary | ICD-10-CM

## 2024-07-17 ENCOUNTER — Telehealth: Payer: Self-pay | Admitting: *Deleted

## 2024-07-17 NOTE — Patient Instructions (Signed)
 Visit Information  Thank you for taking time to visit with me today. Please don't hesitate to contact me if I can be of assistance to you before our next scheduled telephone appointment.  Our next appointment is by telephone on 07/25/24 at 10am  Following is a copy of your care plan:   Goals Addressed             This Visit's Progress    VBCI RN Care Plan       Problems:  Chronic Disease Management support and education needs related to DMII  Goal: Over the next 3 months the Patient will continue to work with RN Care Manager and/or Social Worker to address care management and care coordination needs related to DMII as evidenced by adherence to care management team scheduled appointments      Interventions:   Diabetes Interventions: Assessed patient's understanding of A1c goal: <7% Provided education to patient about basic DM disease process Reviewed medications with patient and discussed importance of medication adherence Counseled on importance of regular laboratory monitoring as prescribed Discussed plans with patient for ongoing care management follow up and provided patient with direct contact information for care management team Review of patient status, including review of consultants reports, relevant laboratory and other test results, and medications completed Screening for signs and symptoms of depression related to chronic disease state  Assessed social determinant of health barriers Lab Results  Component Value Date   HGBA1C 6.3 03/29/2024    Patient Self-Care Activities:  Attend all scheduled provider appointments Call pharmacy for medication refills 3-7 days in advance of running out of medications Call provider office for new concerns or questions  Perform all self care activities independently  Perform IADL's (shopping, preparing meals, housekeeping, managing finances) independently Take medications as prescribed    Plan:  The patient has been provided with  contact information for the care management team and has been advised to call with any health related questions or concerns.  Next RNCM follow up to discuss management of diabetes is 07/25/2024 at 10:00AM          VBCI RN Care Plan       Problems:  Chronic Disease Management support and education needs related to HTN  Goal: Over the next 3 months the Patient will continue to work with RN Care Manager and/or Social Worker to address care management and care coordination needs related to HTN as evidenced by adherence to care management team scheduled appointments      Interventions:   Hypertension Interventions: Last practice recorded BP readings:  BP Readings from Last 3 Encounters:  03/29/24 138/80  11/24/23 138/82  06/09/23 136/80   Most recent eGFR/CrCl: No results found for: EGFR  No components found for: CRCL  Evaluation of current treatment plan related to hypertension self management and patient's adherence to plan as established by provider Provided education to patient re: stroke prevention, s/s of heart attack and stroke Reviewed medications with patient and discussed importance of compliance Counseled on the importance of exercise goals with target of 150 minutes per week Discussed plans with patient for ongoing care management follow up and provided patient with direct contact information for care management team Advised patient, providing education and rationale, to monitor blood pressure daily and record, calling PCP for findings outside established parameters Discussed complications of poorly controlled blood pressure such as heart disease, stroke, circulatory complications, vision complications, kidney impairment, sexual dysfunction Screening for signs and symptoms of depression related to chronic disease  state  Assessed social determinant of health barriers  Patient Self-Care Activities:  Attend all scheduled provider appointments Call pharmacy for medication  refills 3-7 days in advance of running out of medications Call provider office for new concerns or questions  Perform all self care activities independently  Perform IADL's (shopping, preparing meals, housekeeping, managing finances) independently Take medications as prescribed   keep a blood pressure log take blood pressure log to all doctor appointments take medications for blood pressure exactly as prescribed begin an exercise program report new symptoms to your doctor  Plan:  The patient has been provided with contact information for the care management team and has been advised to call with any health related questions or concerns.  Patient saw Dr. [place] regarding his recent Blood Pressure readings trending upwards 150s - 170s/90s - 100s on 6/23/ 2025 @ 930am, PCP referred patient to Cardiologist - has New Patient visit w/ Dr. Michele on 07/13/24.  After seeing his PCP, Dr. [place] for BPs that were trending up into the 150s-160/90s-100, patient's Avapro  was increased from 150mg  daily to 300mg  daily - 2 weeks later, patient states he has seen virtually no difference in his BP readings - advised patient to call his PCP after we finished our conversation and ask to speak to PCP's triage nurse to report no difference in BP readings and to ask for PCP's further instructions   Next RNCM Follow up to review ongoing goal of keeping blood pressure under control is 07/25/2024 @ 10:00 am            Patient verbalizes understanding of instructions and care plan provided today and agrees to view in MyChart. Active MyChart status and patient understanding of how to access instructions and care plan via MyChart confirmed with patient.     The patient has been provided with contact information for the care management team and has been advised to call with any health related questions or concerns.   Please call the care guide team at [phone] if you need to cancel or reschedule your appointment.    Please call 1-800-273-TALK (toll free, 24 hour hotline) if you are experiencing a Mental Health or Behavioral Health Crisis or need someone to talk to.  Antoria Lanza A. Gordy RN, BA, [facility], CRRN [place]  [facility] RN Care Manager Direct Dial: [phone]  Fax: [phone]

## 2024-07-17 NOTE — Patient Outreach (Signed)
 Complex Care Management   Visit Note  07/14/2024  Name:  Charles [name] MRN: [id number] DOB: August 01, 1951  Situation: Referral received for Complex Care Management related to Hypertension, DM2, and recent complaints of Chest Pain which is being followed up by Cardiologist. I obtained verbal consent from patient.  Visit completed with patient  on the phone  Background:   Past Medical History:  Diagnosis Date   Abnormal carotid ultrasound 07/2015   mild bilateral stenosis, repeat in 1 year.  Dr. Mona   Diabetes mellitus without complication (HCC)    Gout    Hepatitis C antibody test positive 05/2015   prior infection, resolved   History of cardiovascular stress test 9/16   Lexiscan , EF 55%, no ischemia, Dr. Mona   Hyperlipidemia    Hypertension    Noncompliance    Obesity    PVD (peripheral vascular disease) (HCC) 07/2015   normal ABIs, Dr. Mona    Assessment: Patient Reported Symptoms:  Cognitive Cognitive Status: Normal speech and language skills, Alert and oriented to person, place, and time, Insightful and able to interpret abstract concepts Cognitive/Intellectual Conditions Management [RPT]: None reported or documented in medical history or problem list   Health Maintenance Behaviors: Annual physical exam, Healthy diet Health Facilitated by: Healthy diet, Rest  Neurological Neurological Review of Symptoms: No symptoms reported    HEENT HEENT Symptoms Reported: No symptoms reported HEENT Management Strategies: Routine screening, Coping strategies, Adequate rest HEENT Self-Management Outcome: 4 (good) Vision problem(s)  Cardiovascular Cardiovascular Symptoms Reported: Other: (Has had c/o chest pain and BPs trending upward - PCP referred pt to Cardiologist whom he saw on 07/13/24) Does patient have uncontrolled Hypertension?: Yes Is patient checking Blood Pressure at home?: Yes Patient's Recent BP reading at home: Has had c/o chest pain and BPs trending upward - PCP referred  pt to Cardiologist whom he saw on 07/13/24 Cardiovascular Management Strategies: Medication therapy, Routine screening, Coping strategies, Adequate rest, Activity, Weight management, Diet modification Do You Have a Working Readable Scale?: Yes Cardiovascular Comment: Saw Dr. Michele on 7/24 for c/o recent chest pain and BPs trending upwards despite RX Avapro  daily dose doubled by PCP - Chest CT w/ contrast was ordered by Cardiologist (scheduled for 07/21/24 at 1pm)  Respiratory Respiratory Symptoms Reported: No symptoms reported    Endocrine Endocrine Symptoms Reported: No symptoms reported Is patient diabetic?: Yes Is patient checking blood sugars at home?: Yes List most recent blood sugar readings, include date and time of day: A1c at goal <7 (03/29/24 = 6.3) Endocrine Self-Management Outcome: 4 (good)  Gastrointestinal Gastrointestinal Symptoms Reported: No symptoms reported Gastrointestinal Management Strategies: Diet modification Gastrointestinal Self-Management Outcome: 4 (good) Nutrition Risk Screen (CP): No indicators present  Genitourinary Genitourinary Symptoms Reported: No symptoms reported    Integumentary Integumentary Symptoms Reported: No symptoms reported    Musculoskeletal Musculoskelatal Symptoms Reviewed: No symptoms reported        Psychosocial Psychosocial Symptoms Reported: Anxiety - if selected complete GAD, Other, Report of significant loss, deaths, abandonment, traumatic incidents Other Psychosocial Conditions: Patient voiced significant stress involving paying bills, especially  medical bills - also states he has been on the [employer] for two years and states that he is very frustrated that other people who live in the same place as he does have rental vouchers but he does not, states he needs help with this. Offered to refer him to a BSW to talk about his concerns - he readily agreed. Referral made.   Major  Change/Loss/Stressor/Fears  (CP): Medical condition, self, Resources Quality of Family Relationships: helpful Do you feel physically threatened by others?: No      05/04/2024    8:41 PM  Depression screen PHQ 2/9  Decreased Interest 0  Down, Depressed, Hopeless 0  PHQ - 2 Score 0    There were no vitals filed for this visit.  Medications Reviewed Today     Reviewed by [name] LABOR, RN (Registered Nurse) on 07/14/24 at 1134  Med List Status: <None>   Medication Order Taking? Sig Documenting Provider Last Dose Status Informant  Accu-Chek FastClix Lancets MISC [id number] Yes TEST BLOOD SUGARS TWO TIMES DAILY. [place], [name], MD  Active   ACCU-CHEK GUIDE TEST test strip [id number] Yes TEST BLOOD SUGAR 1 TO 2 TIMES DAILY [place], [name], MD  Active   albuterol  (VENTOLIN  HFA) 108 [phone] Base) MCG/ACT inhaler [id number] Yes Inhale 2 puffs into the lungs every 6 (six) hours as needed for wheezing or shortness of breath. [place], [name], MD  Active   Alcohol Swabs (B-D SINGLE USE SWABS REGULAR) PADS [id number]  1 Device by Does not apply route daily. [place], [name], MD  Active   allopurinol  (ZYLOPRIM ) 300 MG tablet [id number] Yes TAKE 1 TABLET EVERY DAY [place], [name], MD  Active   amLODipine  (NORVASC ) 10 MG tablet [id number] Yes TAKE 1 TABLET EVERY DAY [place], [name], MD  Active   aspirin  81 MG tablet [id number] Yes Take 81 mg by mouth every evening.  [provider]  Active Self, Pharmacy Records  empagliflozin  (JARDIANCE ) 25 MG TABS tablet [id number] Yes Take 1 tablet (25 mg total) by mouth daily before breakfast. [place], [name], MD  Active            Med Note [name], Brier Reid A   Thu Sep 02, 2023 10:39 AM) PAP  irbesartan  (AVAPRO ) 300 MG tablet [id number] Yes Take 1 tablet (300 mg total) by mouth daily. [place], [name], MD  Active   metoprolol  succinate (TOPROL -XL) 50 MG 24 hr tablet [id number] Yes TAKE 1 TABLET EVERY DAY WITH A MEAL OR IMMEDIATELY FOLLOWING A MEAL [place], [name], MD  Active   metoprolol   tartrate (LOPRESSOR ) 50 MG tablet [id number] Yes Take 1 tablet (50 mg total) by mouth 2 (two) times daily. Take 90-120 minutes prior to scan. HOLD if systolic blood pressure (top number) is less than 100 and/ or heart rate is less than 55. STOP this medication and continue your original Metoprolol  Succinate (Toprol -XL) once CT scan is completed. Tolia, Sunit, DO  Active   rosuvastatin  (CRESTOR ) 20 MG tablet [id number] Yes TAKE 1 TABLET EVERY DAY [place], [name], MD  Active   Semaglutide , 1 MG/DOSE, 4 MG/3ML SOPN [id number] Yes Inject 1 mg as directed once a week. Use voucher do not bill insurance BIN [phone] PCN CNRX ID [id number] Group [id number] [place], [name], MD  Active             Recommendation:   Specialty provider follow-up Chest CT w/ contrast scheduled for 07/21/24 at 1pm. Referral to: BSW for SDOH needs - Patient voiced significant stress involving paying bills, especially [place] medical bills - also states he has been on the [employer] for two years and states that he is very frustrated that other people who live in the same place as he does have rental vouchers but he does not, states he needs help with this. Offered to refer him to a BSW to talk  about his concerns - he readily agreed. Referral was completed.  Follow Up Plan:   Telephone follow up appointment date/time:  07/25/24 at [facility] A. Gordy RN, BA, [facility], CRRN   [facility] Population Health RN Care Manager Direct Dial: [phone]  Fax: [phone]

## 2024-07-17 NOTE — Progress Notes (Signed)
 Complex Care Management Note  Care Guide Note 07/17/2024 Name: TAYSHAWN PURNELL MRN: [id number] DOB: Jun 26, 1951  Curtistine JONELLE Lesches is a 73 y.o. year old male who sees [place], Dickey MATSU, MD for primary care. I reached out to Curtistine JONELLE Lesches by phone today to offer complex care management services.  Mr. Lofton was given information about Complex Care Management services today including:   The Complex Care Management services include support from the care team which includes your Nurse Care Manager, Clinical Social Worker, or Pharmacist.  The Complex Care Management team is here to help remove barriers to the health concerns and goals most important to you. Complex Care Management services are voluntary, and the patient may decline or stop services at any time by request to their care team member.   Complex Care Management Consent Status: Patient agreed to services and verbal consent obtained.   Follow up plan:  Telephone appointment with complex care management team member scheduled for:  07/18/2024  Encounter Outcome:  Patient Scheduled  Thedford Franks, CMA [place]  [facility], [place] [facility] Guide Direct Dial: [phone]  Fax: [phone] Website: [email or web address]

## 2024-07-18 ENCOUNTER — Other Ambulatory Visit (HOSPITAL_COMMUNITY)

## 2024-07-18 ENCOUNTER — Encounter (HOSPITAL_COMMUNITY): Payer: Self-pay

## 2024-07-18 ENCOUNTER — Encounter: Payer: Self-pay | Admitting: Cardiology

## 2024-07-20 ENCOUNTER — Other Ambulatory Visit: Payer: Self-pay

## 2024-07-20 NOTE — Patient Instructions (Signed)
 Visit Information  Thank you for taking time to visit with me today. Please don't hesitate to contact me if I can be of assistance to you before our next scheduled appointment.  Our next appointment is by telephone on 07/27/24 at 10am Please call the care guide team at [phone] if you need to cancel or reschedule your appointment.   Following is a copy of your care plan:   Goals Addressed             This Visit's Progress    BSW VBCI Social Work Care Plan       Problems:   Housing , Transportation, and Medicaid application  CSW Clinical Goal(s):   Over the next 7 days the Patient will await call from community resources.  Interventions:  Social Determinants of Health in Patient with CKD Stage 3, DMII, and HTN: SDOH assessments completed: Housing , Transportation, and Medicaid application Evaluation of current treatment plan related to unmet needs SW t/c Medicaid-[place], [place], [facility] and left message for a return call to patient.  Patient will contact [place] [employer] regarding upcoming charges.    Patient Goals/Self-Care Activities:  Patient will await follow up call from community resources.  Plan:   Telephone follow up appointment with care management team member scheduled for:  07/27/24 at 10am.        Please call 911 if you are experiencing a Mental Health or Behavioral Health Crisis or need someone to talk to.  Patient verbalizes understanding of instructions and care plan provided today and agrees to view in MyChart. Active MyChart status and patient understanding of how to access instructions and care plan via MyChart confirmed with patient.     Tillman Gardener, BSW [place]  [facility], [facility] Social Worker Direct Dial: [phone]  Fax: [phone] Website: [email or web address]

## 2024-07-20 NOTE — Patient Outreach (Signed)
 Complex Care Management   Visit Note  07/20/2024  Name:  Charles [name] MRN: [id number] DOB: 05-25-1951  Situation: Referral received for Complex Care Management related to SDOH Barriers:  Transportation Housing Medicaid application patient I obtained verbal consent from Patient.  Visit completed with patient  on the phone  Background:   Past Medical History:  Diagnosis Date   Abnormal carotid ultrasound 07/2015   mild bilateral stenosis, repeat in 1 year.  Dr. Mona   Diabetes mellitus without complication (HCC)    Gout    Hepatitis C antibody test positive 05/2015   prior infection, resolved   History of cardiovascular stress test 9/16   Lexiscan , EF 55%, no ischemia, Dr. Mona   Hyperlipidemia    Hypertension    Noncompliance    Obesity    PVD (peripheral vascular disease) (HCC) 07/2015   normal ABIs, Dr. Mona    Assessment:  Patient reports he has been waiting for 2 years for Housing Authority and has not been updated on his status.  Patient has upcoming bills from [facility] due to copay and wants better insurance.  Patient is over income for Foodstamps and can't get to food banks due to lack of transportation.  Patient not eligible for SCAT because he can ride the bus and he does not have coverage with his insurance.  Patient needs education on the bus system.  SW referred patient to contact [place] once he receives his bill to make payment arrangements.  SW t/c Medicaid-[place], [place], [facility] and left message for a return call to patient. Patient will contact Medicare to consider a different plan to change during open enrollment.  SDOH Interventions    Flowsheet Row Patient Outreach Telephone from 07/20/2024 in [place] POPULATION HEALTH DEPARTMENT Patient Outreach from 06/27/2024 in [place] POPULATION HEALTH DEPARTMENT Telephone from 06/13/2024 in [place] POPULATION HEALTH DEPARTMENT Patient Outreach from 06/06/2024 in [place] POPULATION HEALTH DEPARTMENT Care  Coordination from 03/02/2024 in [facility] Community Care Coordination Care Coordination from 11/11/2023 in [facility] Care Coordination  SDOH Interventions        Food Insecurity Interventions Other (Comment)  [Family sometimes helps when he runs out] [employer] Provided, Assist with [employer] Provided, Assist with [employer] banks in the area , and also I will send food stamp application] Intervention Not Indicated Intervention Not Indicated Intervention Not Indicated  Housing Interventions Intervention Not Indicated Intervention Not Indicated -- -- -- Intervention Not Indicated  Transportation Interventions Other (Comment)  [Family helps sometimes, Gisele and will call for [place] infor] AMB Referral  [Care Guide Jeanine handling transportation intervention] --  [employer] access] Intervention Not Indicated Intervention Not Indicated Intervention Not Indicated  Utilities Interventions Intervention Not Indicated Intervention Not Indicated -- Intervention Not Indicated -- Intervention Not Indicated  Financial Strain Interventions Intervention Not Indicated -- -- -- -- --  Social Connections Interventions -- -- Programmer, applications Provided  [employer] to patient on senior centers] -- -- --  Health Literacy Interventions -- -- -- Intervention Not Indicated -- --      Recommendation:   None  Follow Up Plan:   Telephone follow up appointment date/time:  07/27/24 at 10am  Tillman Gardener, BSW [place]  [facility], [facility] Psychiatric Social Worker Direct Dial: [phone]  Fax: [phone] Website: [email or web address]

## 2024-07-21 ENCOUNTER — Ambulatory Visit (HOSPITAL_COMMUNITY)
Admission: RE | Admit: 2024-07-21 | Discharge: 2024-07-21 | Disposition: A | Discharge status: Home / Self Care | Source: Ambulatory Visit | Attending: Cardiology | Admitting: Cardiology

## 2024-07-21 ENCOUNTER — Encounter (HOSPITAL_COMMUNITY): Payer: Self-pay

## 2024-07-21 DIAGNOSIS — R072 Precordial pain: Secondary | ICD-10-CM

## 2024-07-21 MED ORDER — IOHEXOL 350 MG/ML SOLN: 100.0000 mL | Freq: Once | INTRAVENOUS | Status: DC | PRN

## 2024-07-25 ENCOUNTER — Other Ambulatory Visit: Payer: Self-pay

## 2024-07-27 ENCOUNTER — Other Ambulatory Visit: Payer: Self-pay

## 2024-07-27 ENCOUNTER — Telehealth: Payer: Self-pay | Admitting: Cardiology

## 2024-07-27 DIAGNOSIS — I10 Essential (primary) hypertension: Secondary | ICD-10-CM

## 2024-07-27 DIAGNOSIS — E1122 Type 2 diabetes mellitus with diabetic chronic kidney disease: Secondary | ICD-10-CM

## 2024-07-27 DIAGNOSIS — I700 Atherosclerosis of aorta: Secondary | ICD-10-CM

## 2024-07-27 DIAGNOSIS — R072 Precordial pain: Secondary | ICD-10-CM

## 2024-07-27 NOTE — Patient Instructions (Signed)
 Visit Information  Thank you for taking time to visit with me today. Please don't hesitate to contact me if I can be of assistance to you before our next scheduled appointment.  Your next care management appointment is by telephone on 08/03/24 at 10:30am   Please call the care guide team at [phone] if you need to cancel, schedule, or reschedule an appointment.   Please call 911 if you are experiencing a Mental Health or Behavioral Health Crisis or need someone to talk to.  Tillman Gardener, BSW [place]  [facility], [facility] Social Worker Direct Dial: [phone]  Fax: [phone] Website: [email or web address]

## 2024-07-27 NOTE — Patient Outreach (Signed)
 Complex Care Management   Visit Note  07/27/2024  Name:  Charles [name] MRN: [id number] DOB: 23-Jul-1951  Situation: Referral received for Complex Care Management related to SDOH Barriers:  Transportation Housing [facility] waiting list Medicaid application I obtained verbal consent from Patient.  Visit completed with patient  on the phone  Background:   Past Medical History:  Diagnosis Date   Abnormal carotid ultrasound 07/2015   mild bilateral stenosis, repeat in 1 year.  Dr. Mona   Diabetes mellitus without complication (HCC)    Gout    Hepatitis C antibody test positive 05/2015   prior infection, resolved   History of cardiovascular stress test 9/16   Lexiscan , EF 55%, no ischemia, Dr. Mona   Hyperlipidemia    Hypertension    Noncompliance    Obesity    PVD (peripheral vascular disease) (HCC) 07/2015   normal ABIs, Dr. Mona    Assessment:  Patient reports that he has not received an update from [place], [facility] or [facility] Medicaid staff.  SW t/c Medicaid-[place], [place] and left message for a return call to patient.  SW t/c [facility] and confirmed patient is still on the waiting list since 12/2021, but needs to update his address with Veronica Ratlif [phone].  SW t/c Ms. Ratlif and left a message to contact patient.  SDOH Interventions    Flowsheet Row Patient Outreach Telephone from 07/20/2024 in [place] POPULATION HEALTH DEPARTMENT Patient Outreach from 06/27/2024 in [place] POPULATION HEALTH DEPARTMENT Telephone from 06/13/2024 in [place] POPULATION HEALTH DEPARTMENT Patient Outreach from 06/06/2024 in [place] POPULATION HEALTH DEPARTMENT Care Coordination from 03/02/2024 in [facility] Community Care Coordination Care Coordination from 11/11/2023 in [facility] [employer] [facility] Care Coordination  SDOH Interventions        Food Insecurity Interventions Other (Comment)  [Family sometimes helps when he runs out] [employer] Provided, Assist with [employer] Provided, Assist with [employer] banks in the area , and also I will send food stamp application] Intervention Not Indicated Intervention Not Indicated Intervention Not Indicated  Housing Interventions Intervention Not Indicated Intervention Not Indicated -- -- -- Intervention Not Indicated  Transportation Interventions Other (Comment)  [Family helps sometimes, Gisele and will call for [place] infor] AMB Referral  [Care Guide Jeanine handling transportation intervention] --  [employer] access] Intervention Not Indicated Intervention Not Indicated Intervention Not Indicated  Utilities Interventions Intervention Not Indicated Intervention Not Indicated -- Intervention Not Indicated -- Intervention Not Indicated  Financial Strain Interventions Intervention Not Indicated -- -- -- -- --  Social Connections Interventions -- -- Programmer, applications Provided  [employer] to patient on senior centers] -- -- --  Health Literacy Interventions -- -- -- Intervention Not Indicated -- --    Recommendation:   None  Follow Up Plan:   Telephone follow up appointment date/time:  08/03/24 at 10:30am  Tillman Gardener, BSW   [facility], [facility] Social Worker Direct Dial: [phone]  Fax: [phone] Website: [email or web address]

## 2024-07-27 NOTE — Progress Notes (Signed)
   Dr. Michele stated he will reach out to pt to consent the pt for cardiac PET/CT and place order.

## 2024-07-27 NOTE — Telephone Encounter (Signed)
 During his last office visit in July 2025 I had discussed undergoing coronary CTA to further evaluate his precordial pain given his risk factors.  However due to IV access issues coronary CTA was normal performed.  Recommended considering a repeat study at the hospital which has more advanced IV access team but patient refuses.  The alternative discussed with family close cardiac PET/CT, later exercise or pharmacological stress test, stress echo, or GXT.  Given his pretest probability would favor cardiac PET/CT for nuclear stress test.  Patient is willing to proceed forward with cardiac PET/CT but would like to have the IV in the hand that have clear vein access. I has asked him to discuss his wishes with the scheduler when called to arrange the study.   Informed Consent   Shared Decision Making/Informed Consent The risks [chest pain, shortness of breath, cardiac arrhythmias, dizziness, blood pressure fluctuations, myocardial infarction, stroke/transient ischemic attack, nausea, vomiting, allergic reaction, radiation exposure, metallic taste sensation and life-threatening complications (estimated to be 1 in 10,000)], benefits (risk stratification, diagnosing coronary artery disease, treatment guidance) and alternatives of a cardiac PET stress test were discussed in detail with Charles [name] and he agrees to proceed.     In the interim, educated him on seeking medical attention sooner by going to the closest ER via EMS if the symptoms increase in intensity, frequency, duration, or has typical chest pain as discussed in the office.  Patient verbalized understanding.  Orders Placed This Encounter  Procedures   NM PET CT CARDIAC PERFUSION MULTI W/ABSOLUTE BLOODFLOW    If indicated for the ordered procedure, I authorize the administration of a radiopharmaceutical per Radiology protocol:   Yes    Preferred Imaging Location:   [facility]   Cardiac Stress Test: Informed Consent Details:  Physician/Practitioner Attestation; Transcribe to consent form and obtain patient signature    Physician/Practitioner attestation of informed consent for procedure/surgical case:   I, the physician/practitioner, attest that I have discussed with the patient the benefits, risks, side effects, alternatives, likelihood of achieving goals and potential problems during recovery for the procedure that I have provided informed consent.    Procedure:   Cardiac PET/CT    Indication/Reason:   Precordial pain     ICD-10-CM   1. Precordial pain  R07.2 NM PET CT CARDIAC PERFUSION MULTI W/ABSOLUTE BLOODFLOW    Cardiac Stress Test: Informed Consent Details: Physician/Practitioner Attestation; Transcribe to consent form and obtain patient signature    2. Atherosclerosis of aorta (HCC)  I70.0 NM PET CT CARDIAC PERFUSION MULTI W/ABSOLUTE BLOODFLOW    Cardiac Stress Test: Informed Consent Details: Physician/Practitioner Attestation; Transcribe to consent form and obtain patient signature    3. Type 2 diabetes mellitus with stage 3a chronic kidney disease, without long-term current use of insulin  (HCC)  E11.22    N18.31     4. Essential hypertension  I10      Charles Markin, DO, FACC 4:45 PM

## 2024-08-01 ENCOUNTER — Other Ambulatory Visit: Payer: Self-pay | Admitting: Family Medicine

## 2024-08-01 DIAGNOSIS — E1159 Type 2 diabetes mellitus with other circulatory complications: Secondary | ICD-10-CM

## 2024-08-03 ENCOUNTER — Other Ambulatory Visit: Payer: Self-pay

## 2024-08-03 NOTE — Patient Outreach (Addendum)
 Complex Care Management   Visit Note  08/03/2024  Name:  Charles [name] MRN: [id number] DOB: 03-22-1951  Situation: Referral received for Complex Care Management related to SDOH Barriers:  Transportation Housing [facility] waiting list Medicaid application I obtained verbal consent from Patient.  Visit completed with patient  on the phone  Background:   Past Medical History:  Diagnosis Date   Abnormal carotid ultrasound 07/2015   mild bilateral stenosis, repeat in 1 year.  Dr. Mona   Diabetes mellitus without complication (HCC)    Gout    Hepatitis C antibody test positive 05/2015   prior infection, resolved   History of cardiovascular stress test 9/16   Lexiscan , EF 55%, no ischemia, Dr. Mona   Hyperlipidemia    Hypertension    Noncompliance    Obesity    PVD (peripheral vascular disease) (HCC) 07/2015   normal ABIs, Dr. Mona    Assessment:  Patient reports no update from [facility], [facility] or [place]. SW t/c [employer] and staff assisted with route information and payment details. SW t/c [facility] and spoke to Ms. Ratlif to update patients address and contact number. SW will follow up with [facility] to request a call back for Medicaid application.  SW t/c Medicaid worker Joi and she agreed to call patient by 08/04/24 to start the application process.  SDOH Interventions    Flowsheet Row Patient Outreach Telephone from 07/20/2024 in [place] POPULATION HEALTH DEPARTMENT Patient Outreach from 06/27/2024 in [place] POPULATION HEALTH DEPARTMENT Telephone from 06/13/2024 in [place] POPULATION HEALTH DEPARTMENT Patient Outreach from 06/06/2024 in [place] POPULATION HEALTH DEPARTMENT Care Coordination from 03/02/2024 in [facility] Care Coordination Care Coordination from 11/11/2023 in [facility] Care Coordination  SDOH Interventions        Food Insecurity Interventions Other (Comment)  [Family sometimes helps when he runs out] [employer] Provided,  Assist with [employer] Provided, Assist with [employer] banks in the area , and also I will send food stamp application] Intervention Not Indicated Intervention Not Indicated Intervention Not Indicated  Housing Interventions Intervention Not Indicated Intervention Not Indicated -- -- -- Intervention Not Indicated  Transportation Interventions Other (Comment)  [Family helps sometimes, Gisele and will call for [employer] infor] AMB Referral  [Care Guide Jeanine handling transportation intervention] --  [employer] access] Intervention Not Indicated Intervention Not Indicated Intervention Not Indicated  Utilities Interventions Intervention Not Indicated Intervention Not Indicated -- Intervention Not Indicated -- Intervention Not Indicated  Financial Strain Interventions Intervention Not Indicated -- -- -- -- --  Social Connections Interventions -- -- Programmer, applications Provided  [employer] to patient on senior centers] -- -- --  Health Literacy Interventions -- -- -- Intervention Not Indicated -- --      Recommendation:   None  Follow Up Plan:   Telephone follow up appointment date/time:  08/07/24 at 3pm  Tillman Gardener, BSW   [facility], [facility] Social Worker Direct Dial: [phone]  Fax: [phone] Website: [email or web address]

## 2024-08-03 NOTE — Patient Instructions (Signed)
 Visit Information  Thank you for taking time to visit with me today. Please don't hesitate to contact me if I can be of assistance to you before our next scheduled appointment.  Your next care management appointment is by telephone on 08/07/24 at 3pm    Please call the care guide team at [phone] if you need to cancel, schedule, or reschedule an appointment.   Please call 911 if you are experiencing a Mental Health or Behavioral Health Crisis or need someone to talk to.  Tillman Gardener, BSW [place]  [facility], [facility] Social Worker Direct Dial: [phone]  Fax: [phone] Website: [email or web address]

## 2024-08-07 ENCOUNTER — Other Ambulatory Visit: Payer: Self-pay

## 2024-08-07 NOTE — Patient Instructions (Signed)
 Visit Information  Thank you for taking time to visit with me today. Please don't hesitate to contact me if I can be of assistance to you before our next scheduled appointment.  Your next care management appointment is by telephone on 08/11/24 at 10am   Please call the care guide team at [phone] if you need to cancel, schedule, or reschedule an appointment.   Please call 911 if you are experiencing a Mental Health or Behavioral Health Crisis or need someone to talk to.  Tillman Gardener, BSW [place]  [facility], [facility] Social Worker Direct Dial: [phone]  Fax: [phone] Website: [email or web address]

## 2024-08-07 NOTE — Patient Outreach (Signed)
 Complex Care Management   Visit Note  08/07/2024  Name:  Charles [name] MRN: [id number] DOB: Mar 16, 1951  Situation: Referral received for Complex Care Management related to SDOH Barriers:  Medicaid I obtained verbal consent from Patient.  Visit completed with patient  on the phone  Background:   Past Medical History:  Diagnosis Date   Abnormal carotid ultrasound 07/2015   mild bilateral stenosis, repeat in 1 year.  Dr. Mona   Diabetes mellitus without complication (HCC)    Gout    Hepatitis C antibody test positive 05/2015   prior infection, resolved   History of cardiovascular stress test 9/16   Lexiscan , EF 55%, no ischemia, Dr. Mona   Hyperlipidemia    Hypertension    Noncompliance    Obesity    PVD (peripheral vascular disease) (HCC) 07/2015   normal ABIs, Dr. Mona    Assessment:  Patient reports he did not receive a call from [facility].  SW t/c Medicaid and spoke to [place]. Appointment is rescheduled for 08/08/24 at 8:30am to complete Medicaid application.SABRA   SDOH Interventions    Flowsheet Row Patient Outreach Telephone from 07/20/2024 in [place] POPULATION HEALTH DEPARTMENT Patient Outreach from 06/27/2024 in [place] POPULATION HEALTH DEPARTMENT Telephone from 06/13/2024 in [place] POPULATION HEALTH DEPARTMENT Patient Outreach from 06/06/2024 in [place] POPULATION HEALTH DEPARTMENT Care Coordination from 03/02/2024 in [facility] Care Coordination Care Coordination from 11/11/2023 in [facility] Care Coordination  SDOH Interventions        Food Insecurity Interventions Other (Comment)  [Family sometimes helps when he runs out] [employer] Provided, Assist with [employer] Provided, Assist with [employer] banks in the area , and also I will send food stamp application] Intervention Not Indicated Intervention Not Indicated Intervention Not Indicated  Housing Interventions  Intervention Not Indicated Intervention Not Indicated -- -- -- Intervention Not Indicated  Transportation Interventions Other (Comment)  [Family helps sometimes, Gisele and will call for [place] infor] AMB Referral  [Care Guide Jeanine handling transportation intervention] --  [employer] access] Intervention Not Indicated Intervention Not Indicated Intervention Not Indicated  Utilities Interventions Intervention Not Indicated Intervention Not Indicated -- Intervention Not Indicated -- Intervention Not Indicated  Financial Strain Interventions Intervention Not Indicated -- -- -- -- --  Social Connections Interventions -- -- Programmer, applications Provided  [employer] to patient on senior centers] -- -- --  Health Literacy Interventions -- -- -- Intervention Not Indicated -- --    Recommendation:   None  Follow Up Plan:   Telephone follow up appointment date/time:  08/11/24 at 10am.  Tillman Gardener, BSW [place]  [facility], [facility] Social Worker Direct Dial: [phone]  Fax: [phone] Website: [email or web address]

## 2024-08-11 ENCOUNTER — Other Ambulatory Visit: Payer: Self-pay

## 2024-08-11 NOTE — Patient Instructions (Signed)

## 2024-08-11 NOTE — Patient Outreach (Signed)
 Complex Care Management   Visit Note  08/11/2024  Name:  Charles [name] MRN: [id number] DOB: Sep 01, 1951  Situation: Referral received for Complex Care Management related to SDOH Barriers:  Medicaid I obtained verbal consent from Patient.  Visit completed with Patient  on the phone  Background:   Past Medical History:  Diagnosis Date   Abnormal carotid ultrasound 07/2015   mild bilateral stenosis, repeat in 1 year.  Dr. Mona   Diabetes mellitus without complication (HCC)    Gout    Hepatitis C antibody test positive 05/2015   prior infection, resolved   History of cardiovascular stress test 9/16   Lexiscan , EF 55%, no ischemia, Dr. Mona   Hyperlipidemia    Hypertension    Noncompliance    Obesity    PVD (peripheral vascular disease) (HCC) 07/2015   normal ABIs, Dr. Mona    Assessment:  Patient reports he received a call from Medicaid to apply by the phone.  Patient completed the Medicaid application and was not eligible based on income. Patient has a $10,000 deductible. Patient reports no addition unmet needs and agreed to contact the provider to be scheduled if needed in the future.  SDOH Interventions    Flowsheet Row Patient Outreach Telephone from 07/20/2024 in [place] POPULATION HEALTH DEPARTMENT Patient Outreach from 06/27/2024 in [place] POPULATION HEALTH DEPARTMENT Telephone from 06/13/2024 in [place] POPULATION HEALTH DEPARTMENT Patient Outreach from 06/06/2024 in [place] POPULATION HEALTH DEPARTMENT Care Coordination from 03/02/2024 in [facility] Care Coordination Care Coordination from 11/11/2023 in [facility] Care Coordination  SDOH Interventions        Food Insecurity Interventions Other (Comment)  [Family sometimes helps when he runs out] [employer] Provided, Assist with [employer] Provided, Assist with [employer] banks in the area , and also I will send food  stamp application] Intervention Not Indicated Intervention Not Indicated Intervention Not Indicated  Housing Interventions Intervention Not Indicated Intervention Not Indicated -- -- -- Intervention Not Indicated  Transportation Interventions Other (Comment)  [Family helps sometimes, Gisele and will call for [place] infor] AMB Referral  [Care Guide Jeanine handling transportation intervention] --  [employer] access] Intervention Not Indicated Intervention Not Indicated Intervention Not Indicated  Utilities Interventions Intervention Not Indicated Intervention Not Indicated -- Intervention Not Indicated -- Intervention Not Indicated  Financial Strain Interventions Intervention Not Indicated -- -- -- -- --  Social Connections Interventions -- -- Programmer, applications Provided  [employer] to patient on senior centers] -- -- --  Health Literacy Interventions -- -- -- Intervention Not Indicated -- --      Recommendation:   none  Follow Up Plan:   Patient has met all care management goals. Care Management case will be closed. Patient has been provided contact information should new needs arise.   Tillman Gardener, BSW [place]  [facility] Social Worker Direct Dial: [phone]  Fax: [phone] Website: [email or web address]

## 2024-08-16 ENCOUNTER — Telehealth: Payer: Self-pay

## 2024-08-16 NOTE — Progress Notes (Signed)
   08/16/2024  Patient ID: Charles [name], male   DOB: [birth year], 73 y.o.   MRN: [id number]  Received refill request from [facility] for patient's enrolled products.  Sent in 4 month order for Ozempic  1mg . Confirmation fax received.  [name], PharmD Clinical Pharmacist [phone]

## 2024-08-22 ENCOUNTER — Ambulatory Visit (HOSPITAL_COMMUNITY)
Admission: RE | Admit: 2024-08-22 | Discharge: 2024-08-22 | Disposition: A | Discharge status: Home / Self Care | Source: Ambulatory Visit | Attending: Internal Medicine | Admitting: Internal Medicine

## 2024-08-22 DIAGNOSIS — R072 Precordial pain: Secondary | ICD-10-CM | POA: Diagnosis not present

## 2024-08-22 LAB — ECHOCARDIOGRAM COMPLETE
Area-P 1/2: 2.9 cm2
S' Lateral: 2.8 cm

## 2024-08-26 ENCOUNTER — Ambulatory Visit: Payer: Self-pay | Admitting: Cardiology

## 2024-09-07 ENCOUNTER — Ambulatory Visit (INDEPENDENT_AMBULATORY_CARE_PROVIDER_SITE_OTHER): Admitting: Family Medicine

## 2024-09-07 DIAGNOSIS — Z0000 Encounter for general adult medical examination without abnormal findings: Secondary | ICD-10-CM

## 2024-09-07 NOTE — Patient Instructions (Signed)
 I really enjoyed getting to talk with you today! I am available on Tuesdays and Thursdays for virtual visits if you have any questions or concerns, or if I can be of any further assistance.   CHECKLIST FROM ANNUAL WELLNESS VISIT:  -Follow up (please call to schedule if not scheduled after visit):   -yearly for annual wellness visit with primary care office  Here is a list of your preventive care/health maintenance measures and the plan for each if any are due:  PLAN For any measures below that may be due:   Can get vaccines at the pharmacy. Please let us  know if you do so that we can update your record. Thanks!  Health Maintenance  Topic Date Due   Zoster Vaccines- Shingrix  (2 of 2) 11/10/2022   OPHTHALMOLOGY EXAM  03/27/2023   Influenza Vaccine  07/21/2024   Medicare Annual Wellness (AWV)  08/04/2024   COVID-19 Vaccine (10 - 2025-26 season) 08/21/2024   HEMOGLOBIN A1C  09/28/2024   FOOT EXAM  11/23/2024   Diabetic kidney evaluation - Urine ACR  04/19/2025   Diabetic kidney evaluation - eGFR measurement  07/03/2025   Colonoscopy  07/30/2025   Pneumococcal Vaccine: 50+ Years  Completed   Hepatitis C Screening  Completed   HPV VACCINES  Aged Out   Meningococcal B Vaccine  Aged Out   DTaP/Tdap/Td  Discontinued   Hepatitis B Vaccines 19-59 Average Risk  Discontinued   Fecal DNA (Cologuard)  Discontinued    -See a dentist at least yearly  -Get your eyes checked and then per your eye specialist's recommendations  -Other issues addressed today:   -I have included below further information regarding a healthy whole foods based diet, physical activity guidelines for adults, stress management and opportunities for social connections. I hope you find this information useful.    -----------------------------------------------------------------------------------------------------------------------------------------------------------------------------------------------------------------------------------------------------------    NUTRITION: -eat real food: lots of colorful vegetables (half the plate) and fruits -5-7 servings of vegetables and fruits per day (fresh or steamed is best), exp. 2 servings of vegetables with lunch and dinner and 2 servings of fruit per day. Berries and greens such as kale and collards are great choices.  -consume on a regular basis:  fresh fruits, fresh veggies, fish, nuts, seeds, healthy oils (such as olive oil, avocado oil), whole grains (make sure for bread/pasta/crackers/etc., that the first ingredient on label contains the word whole), legumes. -can eat small amounts of dairy and lean meat (no larger than the palm of your hand), but avoid processed meats such as ham, bacon, lunch meat, etc. -drink water -try to avoid fast food and pre-packaged foods, processed meat, ultra processed foods/beverages (donuts, candy, etc.) -most experts advise limiting sodium to < 2300mg  per day, should limit further is any chronic conditions such as high blood pressure, heart disease, diabetes, etc. The American Heart Association advised that < 1500mg  is is ideal -try to avoid foods/beverages that contain any ingredients with names you do not recognize  -try to avoid foods/beverages  with added sugar or sweeteners/sweets  -try to avoid sweet drinks (including diet drinks): soda, juice, Gatorade, sweet tea, power drinks, diet drinks -try to avoid white rice, white bread, pasta (unless whole grain)  EXERCISE GUIDELINES FOR ADULTS: -if you wish to increase your physical activity, do so gradually and with the approval of your doctor -STOP and seek medical care immediately if you have any chest pain, chest discomfort or trouble breathing when starting or  increasing exercise  -move and stretch your body,  legs, feet and arms when sitting for long periods -Physical activity guidelines for optimal health in adults: -get at least 150 minutes per week of moderate exercise (can talk, but not sing); this is about 20-30 minutes of sustained activity 5-7 days per week or two 10-15 minute episodes of sustained activity 5-7 days per week -do some muscle building/resistance training/strength training at least 2 days per week  -balance exercises 3+ days per week:   Stand somewhere where you have something sturdy to hold onto if you lose balance    1) lift up on toes, then back down, start with 5x per day and work up to 20x   2) stand and lift one leg straight out to the side so that foot is a few inches of the floor, start with 5x each side and work up to 20x each side   3) stand on one foot, start with 5 seconds each side and work up to 20 seconds on each side  If you need ideas or help with getting more active:  -Silver sneakers https://tools.silversneakers.com  -Walk with a Doc: http://www.duncan-williams.com/  -try to include resistance (weight lifting/strength building) and balance exercises twice per week: or the following link for ideas: http://castillo-powell.com/  BuyDucts.dk  STRESS MANAGEMENT: -can try meditating, or just sitting quietly with deep breathing while intentionally relaxing all parts of your body for 5 minutes daily -if you need further help with stress, anxiety or depression please follow up with your primary doctor or contact the wonderful folks at [facility]: [phone]  SOCIAL CONNECTIONS: -options in [place] if you wish to engage in more social and exercise related activities:  -Silver sneakers https://tools.silversneakers.com  -Walk with a Doc: http://www.duncan-williams.com/  -Check out the [facility] Active Adults 50+  section on the [place] of [employer] (hiking clubs, book clubs, cards and games, chess, exercise classes, aquatic classes and much more) - see the website for details: https://www.[place].gov/departments/parks-recreation/active-adults50  -YouTube has lots of exercise videos for different ages and abilities as well  -[facility] (a variety of indoor and outdoor inperson activities for adults). [phone]. [address]  -Virtual Online Classes (a variety of topics): see seniorplanet.org or call [phone]  -consider volunteering at a school, hospice center, church, senior center or elsewhere   FOR IMPROVED SLEEP AND TO RESET YOUR SLEEP SCHEDULE:  []  Schedule sleep counseling(cognitive behavioral therapy).   [facility] is a good option.   Call for appointment: [phone]  []  Exercise 30 minutes daily. Some people do better with exercise in the morning, other do better exercising later in the day.  []  Avoid caffeine and alcohol - particularly in the evenings. For some people even a little alcohol can impair sleep.   []  Go to bed and wake up at the same time everyday (within a 30 minute window). When you get up in the morning for your designated wake time - turn on lights and open curtains right away. This will help to set your internal sleep clock.   []  Keep bedroom cool, dark and quiet - if you have to get up at night make sure there is no white light from street lamps, night lights, lights, clock, devices etc. that enters your eyes. Instead, use red light flashlight or night lights and avoid looking directly at the light while up.   []  Set 1-2 hour bedtime routine, dim lights, avoid screens (phones, computers, TVs, etc during this time), consider sleepytime tea, warm bath or shower, avoid alcohol and  caffeine  []  Reserve bed for sleep - do not read, watch TV, look at phone or device, etc., in bed.  []  If you toss and turn for more  then 10-15 minutes, get out of bed and list or journal thoughts or do quiet activity (not screen-time, no tv, phone, computer) then go back to bed. Repeat as needed. Try not to worry about when you will eventually fall asleep.  []  Some people find that a half dose of benadryl, melatonin, tylenol  pm or unisom on a few nights per week is helpful initially for a few weeks.  []  Seek help for any depression or anxiety.  [] Prescription strength sleep medications should only be used in severe cases of insomnia if other measures fail and should be used sparingly.  I hope you are feeling better soon! Follow up with your doctor in 3-4 weeks or sooner if your symptoms worsen or new concerns arise.   ADVANCED HEALTHCARE DIRECTIVES:  [place] Advanced Directives assistance:   ExpressWeek.com.cy  Everyone should have advanced health care directives in place. This is so that you get the care you want, should you ever be in a situation where you are unable to make your own medical decisions.   From the [place] Advanced Directive Website: Advance Health Care Directives are legal documents in which you give written instructions about your health care if, in the future, you cannot speak for yourself.   A health care power of attorney allows you to name a person you trust to make your health care decisions if you cannot make them yourself. A declaration of a desire for a natural death (or living will) is document, which states that you desire not to have your life prolonged by extraordinary measures if you have a terminal or incurable illness or if you are in a vegetative state. An advance instruction for mental health treatment makes a declaration of instructions, information and preferences regarding your mental health treatment. It also states that you are aware that the advance instruction authorizes a mental health treatment provider to act according to your  wishes. It may also outline your consent or refusal of mental health treatment. A declaration of an anatomical gift allows anyone over the age of 37 to make a gift by will, organ donor card or other document.   Please see the following website or an elder law attorney for forms, FAQs and for completion of advanced directives: [place]  Print production planner Health Care Directives Advance Health Care Directives (http://guzman.[email or web address]/)  Or copy and paste the following to your web browser: [email or web address]

## 2024-09-07 NOTE — Progress Notes (Signed)
 PATIENT CHECK-IN and HEALTH RISK ASSESSMENT QUESTIONNAIRE:  -completed by phone/video for upcoming Medicare Preventive Visit    Pre-Visit Check-in: 1)Vitals (height, wt, BP, etc) - record in vitals section for visit on day of visit Request home vitals (wt, BP, etc.) and enter into vitals, THEN update Vital Signs SmartPhrase below at the top of the HPI. See below.  2)Review and Update Medications, Allergies PMH, Surgeries, Social history in Epic 3)Hospitalizations in the last year with date/reason? n  4)Review and Update Care Team (patient's specialists) in Epic 5) Complete PHQ9 in Epic  6) Complete Fall Screening in Epic 7)Review all Health Maintenance Due and order if not done.  Medicare Wellness Patient Questionnaire:  Answer theses question about your habits: How often do you have a drink containing alcohol?n How many drinks containing alcohol do you have on a typical day when you are drinking?na How often do you have six or more drinks on one occasion?na Have you ever smoked?y Quit date if applicable? 1996  How many packs a day do/did you smoke? 1ppd Do you use smokeless tobacco?n Do you use an illicit drugs?n On average, how many days per week do you engage in moderate to strenuous exercise (like a brisk walk)?no On average, how many minutes do you engage in exercise at this level?n/a Typical Diet: feels like diet is so so, eats whatever is available. Eggs, milk, poultry, bread, [place], beef  Beverages: water  Answer theses question about your everyday activities: Can you perform most household chores?some with cleaning bathroom Are you deaf or have significant trouble hearing?some Do you feel that you have a problem with memory?no Do you feel safe at home?y Last dentist visit?only has 2 teeth 8. Do you have any difficulty performing your everyday activities?some cleaning Are you having any difficulty walking, taking medications on your own, and or difficulty managing daily  home needs?n Do you have difficulty walking or climbing stairs?some if a lot of stairs need railing Do you have difficulty dressing or bathing?n Do you have difficulty doing errands alone such as visiting a doctor's office or shopping?n - does not have car Do you currently have any difficulty preparing food and eating?n Do you currently have any difficulty using the toilet?n Do you have any difficulty managing your finances?n Do you have any difficulties with housekeeping of managing your housekeeping?n   Do you have Advanced Directives in place (Living Will, Healthcare Power or Attorney)? no   Last eye Exam and location? [facility] - can't afford visits right now, he has checked with billing.   Do you currently use prescribed or non-prescribed narcotic or opioid pain medications?n  Do you have a history or close family history of breast, ovarian, tubal or peritoneal cancer or a family member with BRCA (breast cancer susceptibility 1 and 2) gene mutations?see pmh/FH   ----------------------------------------------------------------------------------------------------------------------------------------------------------------------------------------------------------------------  Because this visit was a virtual/telehealth visit, some criteria may be missing or patient reported. Any vitals not documented were not able to be obtained and vitals that have been documented are patient reported.    MEDICARE ANNUAL PREVENTIVE CARE VISIT WITH PROVIDER (Welcome to Medicare, initial annual wellness or annual wellness exam)  Virtual Visit via Video Note  I connected with Charles [name] on 09/07/24  by a video enabled telemedicine application and verified that I am speaking with the correct person using two identifiers.  Location patient: home Location provider:work or home office Persons participating in the virtual visit: patient, provider  Concerns and/or follow up today:  no  concerns.   See HM section in Epic for other details of completed HM.    ROS: negative for report of fevers, unintentional weight loss, vision changes, vision loss, hearing loss or change, chest pain, sob, hemoptysis, melena, hematochezia, hematuria, falls, bleeding or bruising, thoughts of suicide or self harm, memory loss  Patient-completed extensive health risk assessment - reviewed and discussed with the patient: See Health Risk Assessment completed with patient prior to the visit either above or in recent phone note. This was reviewed in detailed with the patient today and appropriate recommendations, orders and referrals were placed as needed per Summary below and patient instructions.   Review of Medical History: -PMH, PSH, Family History and current specialty and care providers reviewed and updated and listed below   Patient Care Team: [place], [name], MD as PCP - General (Family Medicine) Michele Richardson, DO as PCP - Cardiology (Cardiology) Mona Vinie BROCKS, MD as Consulting Physician (Cardiology) Lionell Jon DEL, [facility] (Pharmacist) [name] LABOR, RN as [facility] Care Management   Past Medical History:  Diagnosis Date   Abnormal carotid ultrasound 07/2015   mild bilateral stenosis, repeat in 1 year.  Dr. Mona   Diabetes mellitus without complication (HCC)    Gout    Hepatitis C antibody test positive 05/2015   prior infection, resolved   History of cardiovascular stress test 9/16   Lexiscan , EF 55%, no ischemia, Dr. Mona   Hyperlipidemia    Hypertension    Noncompliance    Obesity    PVD (peripheral vascular disease) (HCC) 07/2015   normal ABIs, Dr. Mona    Past Surgical History:  Procedure Laterality Date   BALLOON ANGIOPLASTY, ARTERY N/A also placed stent   COLONOSCOPY  2016   Dr. Rollin    Social History   Socioeconomic History   Marital status: Divorced    Spouse name: Not on file   Number of children: Not on file   Years of education: Not on file    Highest education level: 10th grade  Occupational History   Not on file  Tobacco Use   Smoking status: Former    Current packs/day: 0.00    Average packs/day: 1 pack/day for 30.0 years (30.0 ttl pk-yrs)    Types: Cigarettes    Start date: 07/30/1965    Quit date: 07/31/1995    Years since quitting: 29.1   Smokeless tobacco: Never  Vaping Use   Vaping status: Never Used  Substance and Sexual Activity   Alcohol use: No    Alcohol/week: 0.0 standard drinks of alcohol   Drug use: No   Sexual activity: Not on file  Other Topics Concern   Not on file  Social History Narrative   Not on file   Social Drivers of Health   Financial Resource Strain: High Risk (07/20/2024)   Overall Financial Resource Strain (CARDIA)    Difficulty of Paying Living Expenses: Hard  Food Insecurity: Food Insecurity Present (07/20/2024)   Hunger Vital Sign    Worried About Running Out of Food in the Last Year: Sometimes true    Ran Out of Food in the Last Year: Sometimes true  Transportation Needs: Unmet Transportation Needs (07/20/2024)   PRAPARE - Transportation    Lack of Transportation (Medical): Yes    Lack of Transportation (Non-Medical): Yes  Physical Activity: Insufficiently Active (06/09/2024)   Exercise Vital Sign    Days of Exercise per Week: 3 days    Minutes of Exercise per Session: 20 min  Stress: Stress Concern Present (06/09/2024)   [employer] of Occupational Health - Occupational Stress Questionnaire    Feeling of Stress: To some extent  Social Connections: Socially Isolated (06/09/2024)   Social Connection and Isolation Panel    Frequency of Communication with Friends and Family: Once a week    Frequency of Social Gatherings with Friends and Family: Never    Attends Religious Services: Never    Database administrator or Organizations: No    Attends Engineer, structural: Not on file    Marital Status: Divorced  Intimate Partner Violence: Not At Risk (07/20/2024)    Humiliation, Afraid, Rape, and Kick questionnaire    Fear of Current or Ex-Partner: No    Emotionally Abused: No    Physically Abused: No    Sexually Abused: No    Family History  Problem Relation Age of Onset   Asthma Mother    Heart attack Mother    Hypertension Mother    Hyperlipidemia Mother    Hypertension Father    Diabetes Father    HIV Sister    Liver disease Brother    Lung cancer Brother    Hypertension Brother     Current Outpatient Medications on File Prior to Visit  Medication Sig Dispense Refill   Accu-Chek FastClix Lancets MISC TEST BLOOD SUGARS TWO TIMES DAILY. 204 each 3   ACCU-CHEK GUIDE TEST test strip TEST BLOOD SUGAR 1 TO 2 TIMES DAILY 200 strip 3   albuterol  (VENTOLIN  HFA) 108 (90 Base) MCG/ACT inhaler Inhale 2 puffs into the lungs every 6 (six) hours as needed for wheezing or shortness of breath. 8 g 1   allopurinol  (ZYLOPRIM ) 300 MG tablet TAKE 1 TABLET EVERY DAY 90 tablet 3   amLODipine  (NORVASC ) 10 MG tablet TAKE 1 TABLET EVERY DAY 90 tablet 3   aspirin  81 MG tablet Take 81 mg by mouth every evening.      irbesartan  (AVAPRO ) 300 MG tablet Take 1 tablet (300 mg total) by mouth daily. 90 tablet 1   JARDIANCE  25 MG TABS tablet TAKE ONE TABLET BY MOUTH DAILY 90 tablet 2   metoprolol  succinate (TOPROL -XL) 50 MG 24 hr tablet TAKE 1 TABLET EVERY DAY WITH A MEAL OR IMMEDIATELY FOLLOWING A MEAL 90 tablet 3   metoprolol  tartrate (LOPRESSOR ) 50 MG tablet Take 1 tablet (50 mg total) by mouth 2 (two) times daily. Take 90-120 minutes prior to scan. HOLD if systolic blood pressure (top number) is less than 100 and/ or heart rate is less than 55. STOP this medication and continue your original Metoprolol  Succinate (Toprol -XL) once CT scan is completed. 14 tablet 0   rosuvastatin  (CRESTOR ) 20 MG tablet TAKE 1 TABLET EVERY DAY 90 tablet 3   Semaglutide , 1 MG/DOSE, 4 MG/3ML SOPN Inject 1 mg as directed once a week. Use voucher do not bill insurance BIN [phone] PCN CNRX ID  [id number] Group [id number] 3 mL 0   No current facility-administered medications on file prior to visit.    Allergies  Allergen Reactions   Penicillins Other (See Comments)    Per pt blacked out after injection as a child.  Did it involve swelling of the face/tongue/throat, SOB, or low BP? N Did it involve sudden or severe rash/hives, skin peeling, or any reaction on the inside of your mouth or nose? N Did you need to seek medical attention at a hospital or doctor's office? N When did it last happen?   Several Decades Ago  If all above answers are "NO", may proceed with cephalosporin use.        Physical Exam Vitals requested from patient and listed below if patient had equipment and was able to obtain at home for this virtual visit: There were no vitals filed for this visit. Estimated body mass index is 41 kg/m as calculated from the following:   Height as of 07/13/24: 5' 5 (1.651 m).   Weight as of 07/13/24: 246 lb 6.4 oz (111.8 kg).  EKG (optional): deferred due to virtual visit  GENERAL: alert, oriented, no acute distress detected; full vision exam deferred due to pandemic and/or virtual encounter  HEENT: atraumatic, conjunttiva clear, no obvious abnormalities on inspection of external nose and ears  NECK: normal movements of the head and neck  LUNGS: on inspection no signs of respiratory distress, breathing rate appears normal, no obvious gross SOB, gasping or wheezing  CV: no obvious cyanosis  MS: moves all visible extremities without noticeable abnormality  PSYCH/NEURO: pleasant and cooperative, no obvious depression or anxiety, speech and thought processing grossly intact, Cognitive function grossly intact  Flowsheet Row Video Visit from [facility]/05/2023 in [facility]  PHQ-9 Total Score 3        09/07/2024   11:27 AM 05/04/2024    8:41 PM 03/02/2024   10:39 AM 11/11/2023   10:09 AM 08/05/2023   10:08 AM  Depression screen PHQ 2/9   Decreased Interest 0 0 0 0 0  Down, Depressed, Hopeless 1 0 0 0 0  PHQ - 2 Score 1 0 0 0 0  Altered sleeping     1  Tired, decreased energy     1  Change in appetite     1  Feeling bad or failure about yourself      0  Trouble concentrating     0  Moving slowly or fidgety/restless     0  Suicidal thoughts     0  PHQ-9 Score     3       08/05/2023   10:08 AM 05/04/2024    8:38 PM 06/06/2024    9:12 PM 06/28/2024   12:23 AM 09/07/2024   11:25 AM  Fall Risk  Falls in the past year? 0 0 0 0 0  Was there an injury with Fall? 0 0   0  Fall Risk Category Calculator 0 0   0  Fall risk Follow up Falls evaluation completed Falls prevention discussed;Education provided;Falls evaluation completed        SUMMARY AND PLAN:  Encounter for Medicare annual wellness exam  Discussed applicable health maintenance/preventive health measures and advised and referred or ordered per patient preferences: -discussed vaccines due recs/risks, he usually gets at the pharmacy. Requested he obtain record so that we can update record. -discussed eye exam Health Maintenance  Topic Date Due   Zoster Vaccines- Shingrix  (2 of 2) 11/10/2022   OPHTHALMOLOGY EXAM  03/27/2023   Influenza Vaccine  07/21/2024   COVID-19 Vaccine (10 - 2025-26 season) 08/21/2024   HEMOGLOBIN A1C  09/28/2024   FOOT EXAM  11/23/2024   Diabetic kidney evaluation - Urine ACR  04/19/2025   Diabetic kidney evaluation - eGFR measurement  07/03/2025   Colonoscopy  07/30/2025   Medicare Annual Wellness (AWV)  09/07/2025   Pneumococcal Vaccine: 50+ Years  Completed   Hepatitis C Screening  Completed   HPV VACCINES  Aged Out   Meningococcal B Vaccine  Aged Out   DTaP/Tdap/Td  Discontinued   Hepatitis B Vaccines 19-59 Average Risk  Discontinued   Fecal DNA (Cologuard)  Discontinued     Education and counseling on the following was provided based on the above review of health and a plan/checklist for the patient, along with additional  information discussed, was provided for the patient in the patient instructions :  -provided info on advanced directives - see pt insturctions -Provided counseling and plan for difficulty hearing, discussed options for audiology eval -discussed sleep and provided counseling, further info provided in pt instructions -Provided counseling and plan for increased risk of falling if applicable per above screening. Reviewed and demonstrated safe balance exercises that can be done at home to improve balance and discussed exercise guidelines for adults with include balance exercises at least 3 days per week.  -Advised and counseled on a healthy lifestyle - including the importance of a healthy diet, regular physical activity, social connections and stress management. -Reviewed patient's current diet. Advised and counseled on a whole foods based healthy diet. A summary of a healthy diet was provided in the Patient Instructions.  -reviewed patient's current physical activity level and discussed exercise guidelines for adults. Discussed community resources and ideas for safe exercise at home to assist in meeting exercise guideline recommendations in a safe and healthy way. Advised to obtain permission for change in exercise from cardiology before initiating changes.  -Advise yearly dental visits at minimum and regular eye exams   Follow up: see patient instructions   Patient Instructions  I really enjoyed getting to talk with you today! I am available on Tuesdays and Thursdays for virtual visits if you have any questions or concerns, or if I can be of any further assistance.   CHECKLIST FROM ANNUAL WELLNESS VISIT:  -Follow up (please call to schedule if not scheduled after visit):   -yearly for annual wellness visit with primary care office  Here is a list of your preventive care/health maintenance measures and the plan for each if any are due:  PLAN For any measures below that may be due:   Can get  vaccines at the pharmacy. Please let us  know if you do so that we can update your record. Thanks!  Health Maintenance  Topic Date Due   Zoster Vaccines- Shingrix  (2 of 2) 11/10/2022   OPHTHALMOLOGY EXAM  03/27/2023   Influenza Vaccine  07/21/2024   Medicare Annual Wellness (AWV)  08/04/2024   COVID-19 Vaccine (10 - 2025-26 season) 08/21/2024   HEMOGLOBIN A1C  09/28/2024   FOOT EXAM  11/23/2024   Diabetic kidney evaluation - Urine ACR  04/19/2025   Diabetic kidney evaluation - eGFR measurement  07/03/2025   Colonoscopy  07/30/2025   Pneumococcal Vaccine: 50+ Years  Completed   Hepatitis C Screening  Completed   HPV VACCINES  Aged Out   Meningococcal B Vaccine  Aged Out   DTaP/Tdap/Td  Discontinued   Hepatitis B Vaccines 19-59 Average Risk  Discontinued   Fecal DNA (Cologuard)  Discontinued    -See a dentist at least yearly  -Get your eyes checked and then per your eye specialist's recommendations  -Other issues addressed today:   -I have included below further information regarding a healthy whole foods based diet, physical activity guidelines for adults, stress management and opportunities for social connections. I hope you find this information useful.   -----------------------------------------------------------------------------------------------------------------------------------------------------------------------------------------------------------------------------------------------------------    NUTRITION: -eat real food: lots of colorful vegetables (half the plate) and fruits -5-7 servings of vegetables and fruits per day (fresh  or steamed is best), exp. 2 servings of vegetables with lunch and dinner and 2 servings of fruit per day. Berries and greens such as kale and collards are great choices.  -consume on a regular basis:  fresh fruits, fresh veggies, fish, nuts, seeds, healthy oils (such as olive oil, avocado oil), whole grains (make sure for  bread/pasta/crackers/etc., that the first ingredient on label contains the word whole), legumes. -can eat small amounts of dairy and lean meat (no larger than the palm of your hand), but avoid processed meats such as ham, bacon, lunch meat, etc. -drink water -try to avoid fast food and pre-packaged foods, processed meat, ultra processed foods/beverages (donuts, candy, etc.) -most experts advise limiting sodium to < 2300mg  per day, should limit further is any chronic conditions such as high blood pressure, heart disease, diabetes, etc. The American Heart Association advised that < 1500mg  is is ideal -try to avoid foods/beverages that contain any ingredients with names you do not recognize  -try to avoid foods/beverages  with added sugar or sweeteners/sweets  -try to avoid sweet drinks (including diet drinks): soda, juice, Gatorade, sweet tea, power drinks, diet drinks -try to avoid white rice, white bread, pasta (unless whole grain)  EXERCISE GUIDELINES FOR ADULTS: -if you wish to increase your physical activity, do so gradually and with the approval of your doctor -STOP and seek medical care immediately if you have any chest pain, chest discomfort or trouble breathing when starting or increasing exercise  -move and stretch your body, legs, feet and arms when sitting for long periods -Physical activity guidelines for optimal health in adults: -get at least 150 minutes per week of moderate exercise (can talk, but not sing); this is about 20-30 minutes of sustained activity 5-7 days per week or two 10-15 minute episodes of sustained activity 5-7 days per week -do some muscle building/resistance training/strength training at least 2 days per week  -balance exercises 3+ days per week:   Stand somewhere where you have something sturdy to hold onto if you lose balance    1) lift up on toes, then back down, start with 5x per day and work up to 20x   2) stand and lift one leg straight out to the side so  that foot is a few inches of the floor, start with 5x each side and work up to 20x each side   3) stand on one foot, start with 5 seconds each side and work up to 20 seconds on each side  If you need ideas or help with getting more active:  -Silver sneakers https://tools.silversneakers.com  -Walk with a Doc: http://www.duncan-williams.com/  -try to include resistance (weight lifting/strength building) and balance exercises twice per week: or the following link for ideas: http://castillo-powell.com/  BuyDucts.dk  STRESS MANAGEMENT: -can try meditating, or just sitting quietly with deep breathing while intentionally relaxing all parts of your body for 5 minutes daily -if you need further help with stress, anxiety or depression please follow up with your primary doctor or contact the wonderful folks at [facility]: [phone]  SOCIAL CONNECTIONS: -options in [place] if you wish to engage in more social and exercise related activities:  -Silver sneakers https://tools.silversneakers.com  -Walk with a Doc: http://www.duncan-williams.com/  -Check out the [facility] Active Adults 50+ section on the [place] of [employer] (hiking clubs, book clubs, cards and games, chess, exercise classes, aquatic classes and much more) - see the website for details: https://www.[place].gov/departments/parks-recreation/active-adults50  -YouTube has lots of exercise videos for different ages  and abilities as well  -[employer] (a variety of indoor and outdoor inperson activities for adults). [phone]. [address]  -Virtual Online Classes (a variety of topics): see seniorplanet.org or call [phone]  -consider volunteering at a school, hospice center, church, senior center or elsewhere   FOR IMPROVED SLEEP AND TO RESET YOUR SLEEP SCHEDULE:  []  Schedule sleep  counseling(cognitive behavioral therapy).   [facility] is a good option.   Call for appointment: [phone]  []  Exercise 30 minutes daily. Some people do better with exercise in the morning, other do better exercising later in the day.  []  Avoid caffeine and alcohol - particularly in the evenings. For some people even a little alcohol can impair sleep.   []  Go to bed and wake up at the same time everyday (within a 30 minute window). When you get up in the morning for your designated wake time - turn on lights and open curtains right away. This will help to set your internal sleep clock.   []  Keep bedroom cool, dark and quiet - if you have to get up at night make sure there is no white light from street lamps, night lights, lights, clock, devices etc. that enters your eyes. Instead, use red light flashlight or night lights and avoid looking directly at the light while up.   []  Set 1-2 hour bedtime routine, dim lights, avoid screens (phones, computers, TVs, etc during this time), consider sleepytime tea, warm bath or shower, avoid alcohol and caffeine  []  Reserve bed for sleep - do not read, watch TV, look at phone or device, etc., in bed.  []  If you toss and turn for more then 10-15 minutes, get out of bed and list or journal thoughts or do quiet activity (not screen-time, no tv, phone, computer) then go back to bed. Repeat as needed. Try not to worry about when you will eventually fall asleep.  []  Some people find that a half dose of benadryl, melatonin, tylenol  pm or unisom on a few nights per week is helpful initially for a few weeks.  []  Seek help for any depression or anxiety.  [] Prescription strength sleep medications should only be used in severe cases of insomnia if other measures fail and should be used sparingly.  I hope you are feeling better soon! Follow up with your doctor in 3-4 weeks or sooner if your symptoms worsen or new concerns arise.   ADVANCED  HEALTHCARE DIRECTIVES:  [place] Advanced Directives assistance:   ExpressWeek.com.cy  Everyone should have advanced health care directives in place. This is so that you get the care you want, should you ever be in a situation where you are unable to make your own medical decisions.   From the [place] Advanced Directive Website: Advance Health Care Directives are legal documents in which you give written instructions about your health care if, in the future, you cannot speak for yourself.   A health care power of attorney allows you to name a person you trust to make your health care decisions if you cannot make them yourself. A declaration of a desire for a natural death (or living will) is document, which states that you desire not to have your life prolonged by extraordinary measures if you have a terminal or incurable illness or if you are in a vegetative state. An advance instruction for mental health treatment makes a declaration of instructions, information and preferences regarding your mental health treatment. It also states that you  are aware that the advance instruction authorizes a mental health treatment provider to act according to your wishes. It may also outline your consent or refusal of mental health treatment. A declaration of an anatomical gift allows anyone over the age of 47 to make a gift by will, organ donor card or other document.   Please see the following website or an elder law attorney for forms, FAQs and for completion of advanced directives: [place]  Print production planner Health Care Directives Advance Health Care Directives ([email or web address]  Or copy and paste the following to your web browser: [email or web address]           Charles JONELLE Cramp, DO

## 2024-09-25 ENCOUNTER — Other Ambulatory Visit

## 2024-09-25 ENCOUNTER — Other Ambulatory Visit: Payer: Self-pay

## 2024-09-25 DIAGNOSIS — J45909 Unspecified asthma, uncomplicated: Secondary | ICD-10-CM

## 2024-09-25 DIAGNOSIS — E1159 Type 2 diabetes mellitus with other circulatory complications: Secondary | ICD-10-CM

## 2024-09-25 DIAGNOSIS — I10 Essential (primary) hypertension: Secondary | ICD-10-CM

## 2024-09-25 DIAGNOSIS — R0602 Shortness of breath: Secondary | ICD-10-CM

## 2024-09-25 MED ORDER — ALBUTEROL SULFATE HFA 108 (90 BASE) MCG/ACT IN AERS: 2.0000 | INHALATION_SPRAY | Freq: Four times a day (QID) | RESPIRATORY_TRACT | 1 refills | Status: AC | PRN

## 2024-09-25 NOTE — Progress Notes (Signed)
 09/25/2024 Name: Charles [name] MRN: [id number] DOB: 03/20/1951  Chief Complaint  Patient presents with   Medication Management   Diabetes   Hypertension    ERHARD [name] is a 73 y.o. year old male who presented for a telephone visit.   They were referred to the pharmacist by their PCP for assistance in managing diabetes.    Subjective:  Care Team: Primary Care Provider: [place], [name], MD ; Next Scheduled Visit: 11/06/24  Medication Access/Adherence  Current Pharmacy:  [facility] - [place], [place] - [address] [place] [address] Phone: [phone] Fax: [phone]  [id number] - [place], [place] - [address] AT CORNER OF [facility] [address] [place] [address] Phone: [phone] Fax: [phone]  [facility] - [place], [place] - [address] [place] [address] Phone: [phone] Fax: [phone]   Patient reports affordability concerns with their medications: No  Patient reports access/transportation concerns to their pharmacy: No Patient reports adherence concerns with their medications:  No     Diabetes:  Current medications: Ozempic  1mg , Jardiance  25mg  Medications tried in the past: Glimepiride , Trulicity , Metformin , Actos   Current glucose readings: Reporting controlled sugar readings Checking sugars once daily, fasting   Patient denies hypoglycemic s/sx including dizziness, shakiness, sweating. Patient denies hyperglycemic symptoms including polyuria, polydipsia, polyphagia, nocturia, neuropathy, blurred vision.  Current medication access support: Jardiance  25mg  through [facility], Ozempic  1mg  through [facility]  Hypertension:  Current medications: Irbesartan  150mg  once daily, Amlodipine  10mg  daily, Metoprolol  XL 50mg  daily Medications previously tried: Lisinopril , Losartan -hydrochlorothiazide , spironolactone   Patient has a validated,  automated, upper arm home BP cuff Current blood pressure readings readings: stopped checking at home, has not checked in close to 1 month  Patient denies hypotensive s/sx including dizziness, lightheadedness.  Patient denies hypertensive symptoms including headache, chest pain, shortness of breath   Objective:  Lab Results  Component Value Date   HGBA1C 6.3 03/29/2024    Lab Results  Component Value Date   CREATININE 1.32 07/03/2024   BUN 16 07/03/2024   NA 139 07/03/2024   K 3.5 07/03/2024   CL 105 07/03/2024   CO2 23 07/03/2024    Lab Results  Component Value Date   CHOL 121 11/24/2023   HDL 39.80 11/24/2023   LDLCALC 55 11/24/2023   TRIG 132.0 11/24/2023   CHOLHDL 3 11/24/2023    Medications Reviewed Today     Reviewed by Lionell Jon DEL, RPH (Pharmacist) on 09/25/24 at 1148  Med List Status: <None>   Medication Order Taking? Sig Documenting Provider Last Dose Status Informant  Accu-Chek FastClix Lancets MISC [id number]  TEST BLOOD SUGARS TWO TIMES DAILY. Charles, [name], MD  Active   ACCU-CHEK GUIDE TEST test strip [id number]  TEST BLOOD SUGAR 1 TO 2 TIMES DAILY Charles, [name], MD  Active   albuterol  (VENTOLIN  HFA) 108 [phone] Base) MCG/ACT inhaler [id number] Yes Inhale 2 puffs into the lungs every 6 (six) hours as needed for wheezing or shortness of breath. Charles, [name], MD  Active   allopurinol  (ZYLOPRIM ) 300 MG tablet [id number] Yes TAKE 1 TABLET EVERY DAY Charles, [name], MD  Active   amLODipine  (NORVASC ) 10 MG tablet [id number] Yes TAKE 1 TABLET EVERY DAY Charles, [name], MD  Active   aspirin  81 MG tablet [id number] Yes Take 81 mg by mouth every evening.  [provider]  Active Self, Pharmacy Records  irbesartan  (AVAPRO ) 300 MG tablet [id number]  Yes Take 1 tablet (300 mg total) by mouth daily. Charles, [name], MD  Active   JARDIANCE  25 MG TABS tablet [id number] Yes TAKE ONE TABLET BY MOUTH DAILY Charles, [name], MD  Active   metoprolol  succinate (TOPROL -XL)  50 MG 24 hr tablet [id number] Yes TAKE 1 TABLET EVERY DAY WITH A MEAL OR IMMEDIATELY FOLLOWING A MEAL Charles, [name], MD  Active   metoprolol  tartrate (LOPRESSOR ) 50 MG tablet [id number]  Take 1 tablet (50 mg total) by mouth 2 (two) times daily. Take 90-120 minutes prior to scan. HOLD if systolic blood pressure (top number) is less than 100 and/ or heart rate is less than 55. STOP this medication and continue your original Metoprolol  Succinate (Toprol -XL) once CT scan is completed.  Patient not taking: Reported on 09/25/2024   Michele Richardson, DO  Active   rosuvastatin  (CRESTOR ) 20 MG tablet [id number] Yes TAKE 1 TABLET EVERY DAY Charles, [name], MD  Active   Semaglutide , 1 MG/DOSE, 4 MG/3ML SOPN [id number] Yes Inject 1 mg as directed once a week. Use voucher do not bill insurance BIN [phone] PCN CNRX ID [id number] Group [id number] Charles, [name], MD  Active               Assessment/Plan:   Diabetes: - Currently controlled - Reviewed long term cardiovascular and renal outcomes of uncontrolled blood sugar - Reviewed goal A1c, goal fasting, and goal 2 hour post prandial glucose -Continue current medication therapy for now. Informed patient that Ozempic  PAP is ending for medicare patients in 2026. Counseled that at that time, can attempt to get through insurance at pharmacy, if not affordable, will need to consider alternatives  Hypertension: - Currently uncontrolled - Reviewed long term cardiovascular and renal outcomes of uncontrolled blood pressure - Reviewed appropriate blood pressure monitoring technique and reviewed goal blood pressure. Recommended to check home blood pressure and heart rate once to twice daily - Recommend to continue current medication therapy for now, Bring log of BP readings to PCP appt, consider addition of thiazide diuretic if still elevated -Will coordinate Jardiance  2026 renewal via mailed application with [facility]   Follow Up Plan:  1 month  [name],  PharmD Clinical Pharmacist [phone]

## 2024-09-26 ENCOUNTER — Encounter (HOSPITAL_COMMUNITY): Admission: RE | Admit: 2024-09-26 | Source: Ambulatory Visit

## 2024-10-07 ENCOUNTER — Other Ambulatory Visit: Payer: Self-pay | Admitting: Family Medicine

## 2024-10-07 DIAGNOSIS — E118 Type 2 diabetes mellitus with unspecified complications: Secondary | ICD-10-CM

## 2024-10-14 ENCOUNTER — Other Ambulatory Visit: Payer: Self-pay | Admitting: Family Medicine

## 2024-10-14 DIAGNOSIS — E118 Type 2 diabetes mellitus with unspecified complications: Secondary | ICD-10-CM

## 2024-11-01 ENCOUNTER — Other Ambulatory Visit

## 2024-11-01 DIAGNOSIS — I10 Essential (primary) hypertension: Secondary | ICD-10-CM

## 2024-11-01 DIAGNOSIS — E1159 Type 2 diabetes mellitus with other circulatory complications: Secondary | ICD-10-CM

## 2024-11-01 NOTE — Progress Notes (Signed)
 11/01/2024 Name: Charles [name] MRN: [id number] DOB: Feb 21, 1951  Chief Complaint  Patient presents with   Medication Management   Diabetes   Hypertension    Charles [name] is a 73 y.o. year old male who presented for a telephone visit.   They were referred to the pharmacist by their PCP for assistance in managing diabetes.    Subjective:  Care Team: Primary Care Provider: Jordan, [name], MD ; Next Scheduled Visit: 11/06/24  Medication Access/Adherence  Current Pharmacy:  [facility] - [place], [place] - [address] [place] [address] Phone: [phone] Fax: [phone]  [id number] - [place], [place] - [address] AT CORNER OF [facility] [address] [place] [address] Phone: [phone] Fax: [phone]  [facility] - [place], [place] - [address] [place] [address] Phone: [phone] Fax: [phone]   Patient reports affordability concerns with their medications: No  Patient reports access/transportation concerns to their pharmacy: No Patient reports adherence concerns with their medications:  No     Diabetes:  Current medications: Ozempic  1mg , Jardiance  25mg  Medications tried in the past: Glimepiride , Trulicity , Metformin , Actos   Current glucose readings: Reporting controlled sugar readings Checking sugars once daily, fasting   Patient denies hypoglycemic s/sx including dizziness, shakiness, sweating. Patient denies hyperglycemic symptoms including polyuria, polydipsia, polyphagia, nocturia, neuropathy, blurred vision.  Current medication access support: Jardiance  25mg  through [facility], Ozempic  1mg  through [facility]  Aware Novo program ending, wants assistance with insurance plan selection  Hypertension:  Current medications: Irbesartan  150mg  once daily, Amlodipine  10mg  daily, Metoprolol  XL 50mg  daily Medications previously tried: Lisinopril ,  Losartan -hydrochlorothiazide , spironolactone   Patient has a validated, automated, upper arm home BP cuff Current blood pressure readings readings: not able to review today, sees PCP next week  Patient denies hypotensive s/sx including dizziness, lightheadedness.  Patient denies hypertensive symptoms including headache, chest pain, shortness of breath   Objective:  Lab Results  Component Value Date   HGBA1C 6.3 03/29/2024    Lab Results  Component Value Date   CREATININE 1.32 07/03/2024   BUN 16 07/03/2024   NA 139 07/03/2024   K 3.5 07/03/2024   CL 105 07/03/2024   CO2 23 07/03/2024    Lab Results  Component Value Date   CHOL 121 11/24/2023   HDL 39.80 11/24/2023   LDLCALC 55 11/24/2023   TRIG 132.0 11/24/2023   CHOLHDL 3 11/24/2023    Medications Reviewed Today     Reviewed by Lionell Jon DEL, RPH (Pharmacist) on 11/01/24 at 1031  Med List Status: <None>   Medication Order Taking? Sig Documenting Provider Last Dose Status Informant  Accu-Chek FastClix Lancets MISC [id number]  TEST BLOOD SUGARS TWO TIMES DAILY. Jordan, [name], MD  Active   ACCU-CHEK GUIDE TEST test strip [id number]  TEST BLOOD SUGAR 1 TO 2 TIMES DAILY Jordan, [name], MD  Active   albuterol  (VENTOLIN  HFA) 108 [id number] Base) MCG/ACT inhaler [id number]  Inhale 2 puffs into the lungs every 6 (six) hours as needed for wheezing or shortness of breath. Jordan, [name], MD  Active   allopurinol  (ZYLOPRIM ) 300 MG tablet [id number]  TAKE 1 TABLET EVERY DAY Jordan, [name], MD  Active   amLODipine  (NORVASC ) 10 MG tablet [id number]  TAKE 1 TABLET EVERY DAY Jordan, [name], MD  Active   aspirin  81 MG tablet [id number]  Take 81 mg by mouth every evening.  [provider]  Active Self, Pharmacy  Records  irbesartan  (AVAPRO ) 300 MG tablet [id number]  Take 1 tablet (300 mg total) by mouth daily. Jordan, [name], MD  Active   JARDIANCE  25 MG TABS tablet [id number]  TAKE ONE TABLET BY MOUTH DAILY Jordan, [name], MD   Active   metoprolol  succinate (TOPROL -XL) 50 MG 24 hr tablet [id number]  TAKE 1 TABLET EVERY DAY WITH A MEAL OR IMMEDIATELY FOLLOWING A MEAL Jordan, [name], MD  Active   metoprolol  tartrate (LOPRESSOR ) 50 MG tablet [id number]  Take 1 tablet (50 mg total) by mouth 2 (two) times daily. Take 90-120 minutes prior to scan. HOLD if systolic blood pressure (top number) is less than 100 and/ or heart rate is less than 55. STOP this medication and continue your original Metoprolol  Succinate (Toprol -XL) once CT scan is completed.  Patient not taking: Reported on 09/25/2024   Michele Richardson, DO  Active   rosuvastatin  (CRESTOR ) 20 MG tablet [id number]  TAKE 1 TABLET EVERY DAY Jordan, [name], MD  Active   Semaglutide , 1 MG/DOSE, 4 MG/3ML SOPN [id number]  Inject 1 mg as directed once a week. Use voucher do not bill insurance BIN [phone] PCN CNRX ID [id number] Group [id number] Jordan, [name], MD  Active               Assessment/Plan:   Diabetes: - Currently controlled - Reviewed long term cardiovascular and renal outcomes of uncontrolled blood sugar - Reviewed goal A1c, goal fasting, and goal 2 hour post prandial glucose -Continue current medication therapy for now. Informed patient that Ozempic  PAP is ending for medicare patients in 2026. Counseled that at that time, can attempt to get through insurance at pharmacy, if not affordable, will need to consider alternatives -Provided info for [facility]. Also provided patient with number for social services as he had medicaid questions, and provided number to [facility] billing as patient had questions regarding a [facility] bill  Hypertension: - Currently uncontrolled - Reviewed long term cardiovascular and renal outcomes of uncontrolled blood pressure - Reviewed appropriate blood pressure monitoring technique and reviewed goal blood pressure. Recommended to check home blood pressure and heart rate once to twice daily - Recommend to continue current medication therapy for  now, Bring log of BP readings to PCP appt, consider addition of thiazide diuretic if still elevated    Follow Up Plan:  11/06/24 for Jardiance  PAP renewal  [name], PharmD Clinical Pharmacist [phone]

## 2024-11-04 ENCOUNTER — Other Ambulatory Visit: Payer: Self-pay | Admitting: Family Medicine

## 2024-11-04 DIAGNOSIS — I10 Essential (primary) hypertension: Secondary | ICD-10-CM

## 2024-11-06 ENCOUNTER — Ambulatory Visit

## 2024-11-06 ENCOUNTER — Ambulatory Visit (INDEPENDENT_AMBULATORY_CARE_PROVIDER_SITE_OTHER): Admitting: Family Medicine

## 2024-11-06 ENCOUNTER — Encounter: Payer: Self-pay | Admitting: Family Medicine

## 2024-11-06 VITALS — BP 132/84 | HR 75 | Temp 98.5°F | Resp 16 | Ht 65.0 in | Wt 251.8 lb

## 2024-11-06 DIAGNOSIS — E1122 Type 2 diabetes mellitus with diabetic chronic kidney disease: Secondary | ICD-10-CM | POA: Diagnosis not present

## 2024-11-06 DIAGNOSIS — Z7985 Long-term (current) use of injectable non-insulin antidiabetic drugs: Secondary | ICD-10-CM

## 2024-11-06 DIAGNOSIS — I10 Essential (primary) hypertension: Secondary | ICD-10-CM

## 2024-11-06 DIAGNOSIS — Z6841 Body Mass Index (BMI) 40.0 and over, adult: Secondary | ICD-10-CM

## 2024-11-06 DIAGNOSIS — F1921 Other psychoactive substance dependence, in remission: Secondary | ICD-10-CM | POA: Diagnosis not present

## 2024-11-06 DIAGNOSIS — E876 Hypokalemia: Secondary | ICD-10-CM

## 2024-11-06 DIAGNOSIS — N1831 Chronic kidney disease, stage 3a: Secondary | ICD-10-CM

## 2024-11-06 DIAGNOSIS — E1159 Type 2 diabetes mellitus with other circulatory complications: Secondary | ICD-10-CM

## 2024-11-06 DIAGNOSIS — E785 Hyperlipidemia, unspecified: Secondary | ICD-10-CM | POA: Diagnosis not present

## 2024-11-06 DIAGNOSIS — Z7984 Long term (current) use of oral hypoglycemic drugs: Secondary | ICD-10-CM

## 2024-11-06 LAB — COMPREHENSIVE METABOLIC PANEL WITH GFR
ALT: 27 U/L (ref 0–53)
AST: 27 U/L (ref 0–37)
Albumin: 4.4 g/dL (ref 3.5–5.2)
Alkaline Phosphatase: 121 U/L — ABNORMAL HIGH (ref 39–117)
BUN: 16 mg/dL (ref 6–23)
CO2: 21 meq/L (ref 19–32)
Calcium: 9.2 mg/dL (ref 8.4–10.5)
Chloride: 102 meq/L (ref 96–112)
Creatinine, Ser: 1.23 mg/dL (ref 0.40–1.50)
GFR: 58.51 mL/min — ABNORMAL LOW (ref >60.00)
Glucose, Bld: 119 mg/dL — ABNORMAL HIGH (ref 70–99)
Potassium: 3.9 meq/L (ref 3.5–5.1)
Sodium: 138 meq/L (ref 135–145)
Total Bilirubin: 0.6 mg/dL (ref 0.2–1.2)
Total Protein: 7.6 g/dL (ref 6.0–8.3)

## 2024-11-06 LAB — MICROALBUMIN / CREATININE URINE RATIO
Creatinine,U: 43.4 mg/dL
Microalb Creat Ratio: UNDETERMINED mg/g (ref 0.0–30.0)
Microalb, Ur: 0.7 mg/dL (ref 0.0–1.9)

## 2024-11-06 LAB — LIPID PANEL
Cholesterol: 127 mg/dL (ref 0–200)
HDL: 47.4 mg/dL (ref >39.00)
LDL Cholesterol: 66 mg/dL (ref 0–99)
NonHDL: 79.87
Total CHOL/HDL Ratio: 3
Triglycerides: 70 mg/dL (ref 0.0–149.0)
VLDL: 14 mg/dL (ref 0.0–40.0)

## 2024-11-06 LAB — POCT GLYCOSYLATED HEMOGLOBIN (HGB A1C): Hemoglobin A1C: 6.9 % — AB (ref 4.0–5.6)

## 2024-11-06 NOTE — Assessment & Plan Note (Signed)
,   Abilities: BMI 41, hyperlipidemia, GERD.

## 2024-11-06 NOTE — Assessment & Plan Note (Signed)
 Problem has been stable, last creatinine 1.3 and eGFR 53.88 06/2024. Continue adequate hydration, low-salt diet, and avoidance of NSAIDs. Adequate BP and glucose control. Currently on Jardiance  25 mg daily and irbesartan  300 mg daily.

## 2024-11-06 NOTE — Progress Notes (Signed)
   11/06/2024  Patient ID: Charles [name], Charles [name]   DOB: [birth year], 73 y.o.   MRN: [id number]  Patient presented in office to complete his portion of the Jardiance  [employer] renewal PAP application and provided requested proof of income.  Completed, faxed into company, pending decision.  [name], PharmD Clinical Pharmacist [phone]

## 2024-11-06 NOTE — Assessment & Plan Note (Signed)
 Hemoglobin A1c today 6.9, still at goal, last visit we discontinued glimepiride . Continue Ozempic  1 mg weekly and Jardiance  25 mg daily. Continue appropriate foot care and regular eye exams. He is going to try to resume regular physical activity and be more adherent to dietary recommendations. Continue monitoring BS regularly. Follow-up in 3 to 4 months.

## 2024-11-06 NOTE — Assessment & Plan Note (Signed)
 BP today otherwise adequately controlled.  Reporting SBP's 140s at home but he he believes his BP monitor is not accurate. For now continue amlodipine  10 mg daily, irbesartan  300 mg daily, and metoprolol  succinate 50 mg daily. Low-salt diet also recommended. Instructed to bring BP monitor to the office next time he has an appointment. We discussed possible complications of elevated BP.

## 2024-11-06 NOTE — Patient Instructions (Addendum)
 A few things to remember from today's visit:  Type 2 diabetes mellitus with vascular disease (HCC) - Plan: POC HgB A1c, Microalbumin / creatinine urine ratio, Comprehensive metabolic panel with GFR  Essential hypertension - Plan: Comprehensive metabolic panel with GFR  Stage 3a chronic kidney disease (HCC) - Plan: Microalbumin / creatinine urine ratio, Comprehensive metabolic panel with GFR  Hypokalemia  Hyperlipidemia, unspecified hyperlipidemia type - Plan: Lipid panel  No changes today. Bring your blood pressure monitor next visit.  If you need refills for medications you take chronically, please call your pharmacy. Do not use My Chart to request refills or for acute issues that need immediate attention. If you send a my chart message, it may take a few days to be addressed, specially if I am not in the office.  Please be sure medication list is accurate. If a new problem present, please set up appointment sooner than planned today.

## 2024-11-06 NOTE — Assessment & Plan Note (Signed)
 Weight has been otherwise stable. Encouraged consistency with following a healthful diet and engaging in regular physical activity.  Due to chronic pain, exercise can be challenging, recommend walking and plays while watching TV during commercials.

## 2024-11-06 NOTE — Progress Notes (Unsigned)
 Chief Complaint  Patient presents with   Medical Management of Chronic Issues   Discussed the use of AI scribe software for clinical note transcription with the patient, who gave verbal consent to proceed. History of Present Illness Charles [name] is a 73 year old male with past medical history significant for aortic atherosclerosis, hypertension, DM 2, GERD, hyperlipidemia, and chronic pain here today for chronic disease management. Last seen on 06/12/2024. Since his last visit he has followed with cardiologist, Dr. Michele, seen on 07/13/2024. Nuclear stress test was not done because difficult vein access; which he attributes to past drug use. His cardiologist advised him to go to the emergency room if he experiences chest pain.  He also notes that he had cataract surgery on his right eye in March/2025 and has a follow-up appointment in December to assess the need for surgery on the other eye.  Hypertension and CKD III: He has been experiencing elevated blood pressure readings at home, with recent measurements including 148/93 mmHg on October 6th, 142/90 mmHg on October 14th, 146/90 mmHg on October 26th, and 146/89 mmHg on November 1st. A particularly high reading of 180/100 mmHg was noted after using mouthwash this morning, which he questions the accuracy of. Re-checked and it did improved.   He is currently taking amlodipine  10 mg daily, irbesartan  300 mg daily, and metoprolol  succinate 50 mg daily.  Hydrochlorothiazide  in the past, discontinued due to low potassium levels.  He reports increased consumption of Cheetos and decreased physical activity due to aches and pains. Negative for unusual or severe headache, visual changes, exertional chest pain, dyspnea,  focal weakness, or edema. Has not noted foam in urine or decrease urine output.  Lab Results  Component Value Date   NA 139 07/03/2024   CL 105 07/03/2024   K 3.5 07/03/2024   CO2 23 07/03/2024   BUN 16 07/03/2024    CREATININE 1.32 07/03/2024   GFR 53.89 (L) 07/03/2024   CALCIUM  9.7 07/03/2024   ALBUMIN 4.4 11/24/2023   GLUCOSE 109 (H) 07/03/2024   Diabetes Mellitus II: Dx'ed around 2011  Currently on Jardiance  25 mg daily and Ozempic  1 mg weekly.  He is getting Ozempic  through patient assistance program but expresses concern about the cost of Ozempic  as he will no longer receive it for free next year. BS's levels are typically around 120-130 mg/dL before breakfast, with occasional readings as high as 150 mg/dL.   Negative for symptoms of hypoglycemia, polyuria, polydipsia, numbness extremities, foot ulcers/trauma  Lab Results  Component Value Date   HGBA1C 6.3 03/29/2024   Lab Results  Component Value Date   MICROALBUR 2.3 (H) 04/19/2024   Hyperlipidemia: He is on rosuvastatin  20 mg daily. Lab Results  Component Value Date   CHOL 121 11/24/2023   HDL 39.80 11/24/2023   LDLCALC 55 11/24/2023   TRIG 132.0 11/24/2023   CHOLHDL 3 11/24/2023   Lab Results  Component Value Date   ALT 27 11/24/2023   AST 26 11/24/2023   ALKPHOS 117 11/24/2023   BILITOT 0.5 11/24/2023   Review of Systems  Constitutional:  Negative for activity change, appetite change and fever.  HENT:  Negative for sore throat.   Respiratory:  Negative for cough and wheezing.   Gastrointestinal:  Negative for abdominal pain, nausea and vomiting.  Genitourinary:  Negative for dysuria and hematuria.  Musculoskeletal:  Positive for arthralgias.  Skin:  Negative for rash.  Neurological:  Negative for syncope.  Psychiatric/Behavioral:  Negative for confusion  and hallucinations.   See other pertinent positives and negatives in HPI.  Current Outpatient Medications on File Prior to Visit  Medication Sig Dispense Refill   Accu-Chek FastClix Lancets MISC TEST BLOOD SUGARS TWO TIMES DAILY. 204 each 3   ACCU-CHEK GUIDE TEST test strip TEST BLOOD SUGAR 1 TO 2 TIMES DAILY 200 strip 3   albuterol  (VENTOLIN  HFA) 108 (90 Base) MCG/ACT  inhaler Inhale 2 puffs into the lungs every 6 (six) hours as needed for wheezing or shortness of breath. 8 g 1   allopurinol  (ZYLOPRIM ) 300 MG tablet TAKE 1 TABLET EVERY DAY 90 tablet 3   amLODipine  (NORVASC ) 10 MG tablet TAKE 1 TABLET EVERY DAY 90 tablet 3   aspirin  81 MG tablet Take 81 mg by mouth every evening.      JARDIANCE  25 MG TABS tablet TAKE ONE TABLET BY MOUTH DAILY 90 tablet 2   metoprolol  succinate (TOPROL -XL) 50 MG 24 hr tablet TAKE 1 TABLET EVERY DAY WITH A MEAL OR IMMEDIATELY FOLLOWING A MEAL 90 tablet 3   rosuvastatin  (CRESTOR ) 20 MG tablet TAKE 1 TABLET EVERY DAY 90 tablet 3   Semaglutide , 1 MG/DOSE, 4 MG/3ML SOPN Inject 1 mg as directed once a week. Use voucher do not bill insurance BIN [phone] PCN CNRX ID [id number] Group [id number] 3 mL 0   irbesartan  (AVAPRO ) 300 MG tablet TAKE 1 TABLET EVERY DAY (DOSE INCREASE) 90 tablet 3   No current facility-administered medications on file prior to visit.    Past Medical History:  Diagnosis Date   Abnormal carotid ultrasound 07/2015   mild bilateral stenosis, repeat in 1 year.  Dr. Mona   Diabetes mellitus without complication (HCC)    Gout    Hepatitis C antibody test positive 05/2015   prior infection, resolved   History of cardiovascular stress test 9/16   Lexiscan , EF 55%, no ischemia, Dr. Mona   Hyperlipidemia    Hypertension    Noncompliance    Obesity    PVD (peripheral vascular disease) 07/2015   normal ABIs, Dr. Mona    Allergies  Allergen Reactions   Penicillins Other (See Comments)    Per pt blacked out after injection as a child.  Did it involve swelling of the face/tongue/throat, SOB, or low BP? N Did it involve sudden or severe rash/hives, skin peeling, or any reaction on the inside of your mouth or nose? N Did you need to seek medical attention at a hospital or doctor's office? N When did it last happen?   Several Decades Ago    If all above answers are "NO", may proceed with cephalosporin use.      Social History   Socioeconomic History   Marital status: Divorced    Spouse name: Not on file   Number of children: Not on file   Years of education: Not on file   Highest education level: 10th grade  Occupational History   Not on file  Tobacco Use   Smoking status: Former    Current packs/day: 0.00    Average packs/day: 1 pack/day for 30.0 years (30.0 ttl pk-yrs)    Types: Cigarettes    Start date: 07/30/1965    Quit date: 07/31/1995    Years since quitting: 29.2   Smokeless tobacco: Never  Vaping Use   Vaping status: Never Used  Substance and Sexual Activity   Alcohol use: No    Alcohol/week: 0.0 standard drinks of alcohol   Drug use: No   Sexual activity: Not on  file  Other Topics Concern   Not on file  Social History Narrative   Not on file   Social Drivers of Health   Financial Resource Strain: High Risk (07/20/2024)   Overall Financial Resource Strain (CARDIA)    Difficulty of Paying Living Expenses: Hard  Food Insecurity: Food Insecurity Present (07/20/2024)   Hunger Vital Sign    Worried About Running Out of Food in the Last Year: Sometimes true    Ran Out of Food in the Last Year: Sometimes true  Transportation Needs: Unmet Transportation Needs (07/20/2024)   PRAPARE - Administrator, Civil Service (Medical): Yes    Lack of Transportation (Non-Medical): Yes  Physical Activity: Insufficiently Active (06/09/2024)   Exercise Vital Sign    Days of Exercise per Week: 3 days    Minutes of Exercise per Session: 20 min  Stress: Stress Concern Present (06/09/2024)   [employer] of Occupational Health - Occupational Stress Questionnaire    Feeling of Stress: To some extent  Social Connections: Socially Isolated (06/09/2024)   Social Connection and Isolation Panel    Frequency of Communication with Friends and Family: Once a week    Frequency of Social Gatherings with Friends and Family: Never    Attends Religious Services: Never    Loss Adjuster, Chartered or Organizations: No    Attends Engineer, Structural: Not on file    Marital Status: Divorced    Today's Vitals   11/06/24 0848  BP: 132/84  Pulse: 75  Resp: 16  Temp: 98.5 F (36.9 C)  TempSrc: Oral  SpO2: 97%  Weight: 251 lb 12.8 oz (114.2 kg)  Height: 5' 5 (1.651 m)   Wt Readings from Last 3 Encounters:  11/06/24 251 lb 12.8 oz (114.2 kg)  07/13/24 246 lb 6.4 oz (111.8 kg)  06/12/24 250 lb 2 oz (113.5 kg)   Body mass index is 41.9 kg/m.  Physical Exam Vitals and nursing note reviewed.  Constitutional:      General: He is not in acute distress.    Appearance: He is well-developed.  HENT:     Head: Normocephalic and atraumatic.     Mouth/Throat:     Mouth: Mucous membranes are moist.     Pharynx: Uvula midline.  Eyes:     Conjunctiva/sclera: Conjunctivae normal.  Cardiovascular:     Rate and Rhythm: Normal rate and regular rhythm.     Pulses:          Dorsalis pedis pulses are 2+ on the right side and 2+ on the left side.     Heart sounds: No murmur heard. Pulmonary:     Effort: Pulmonary effort is normal. No respiratory distress.     Breath sounds: Normal breath sounds.  Abdominal:     Palpations: Abdomen is soft. There is no hepatomegaly or mass.     Tenderness: There is no abdominal tenderness.  Musculoskeletal:     Thoracic back: No tenderness.     Lumbar back: No tenderness or bony tenderness.     Right lower leg: No edema.     Left lower leg: No edema.  Lymphadenopathy:     Cervical: No cervical adenopathy.  Skin:    General: Skin is warm.     Findings: No erythema or rash.  Neurological:     Mental Status: He is alert and oriented to person, place, and time.     Cranial Nerves: No cranial nerve deficit.     Gait:  Gait normal.  Psychiatric:        Mood and Affect: Mood and affect normal.    ASSESSMENT AND PLAN:  Charles [name] was seen today for medical management of chronic issues.  Diagnoses and all orders for this  visit: Orders Placed This Encounter  Procedures   Microalbumin / creatinine urine ratio   Lipid panel   Comprehensive metabolic panel with GFR   POC HgB A1c   Lab Results  Component Value Date   HGBA1C 6.9 (A) 11/06/2024   Lab Results  Component Value Date   MICROALBUR 0.7 11/06/2024   MICROALBUR 2.3 (H) 04/19/2024   Lab Results  Component Value Date   CHOL 127 11/06/2024   HDL 47.40 11/06/2024   LDLCALC 66 11/06/2024   TRIG 70.0 11/06/2024   CHOLHDL 3 11/06/2024   Lab Results  Component Value Date   NA 138 11/06/2024   CL 102 11/06/2024   K 3.9 11/06/2024   CO2 21 11/06/2024   BUN 16 11/06/2024   CREATININE 1.23 11/06/2024   GFR 58.51 (L) 11/06/2024   CALCIUM  9.2 11/06/2024   ALBUMIN 4.4 11/06/2024   GLUCOSE 119 (H) 11/06/2024   Lab Results  Component Value Date   ALT 27 11/06/2024   AST 27 11/06/2024   ALKPHOS 121 (H) 11/06/2024   BILITOT 0.6 11/06/2024   Type 2 diabetes mellitus with vascular disease (HCC) Assessment & Plan: Hemoglobin A1c today 6.9, still at goal, last visit we discontinued glimepiride . Continue Ozempic  1 mg weekly and Jardiance  25 mg daily. Continue appropriate foot care and regular eye exams. He is going to try to resume regular physical activity and be more adherent to dietary recommendations. Continue monitoring BS regularly. Follow-up in 3 to 4 months.  Orders: -     POCT glycosylated hemoglobin (Hb A1C) -     Microalbumin / creatinine urine ratio; Future -     Comprehensive metabolic panel with GFR; Future  Stage 3a chronic kidney disease (HCC) Assessment & Plan: Problem has been stable, last creatinine 1.3 and eGFR 53.88 06/2024. Continue adequate hydration, low-salt diet, and avoidance of NSAIDs. Adequate BP and glucose control. Currently on Jardiance  25 mg daily and irbesartan  300 mg daily.  Orders: -     Microalbumin / creatinine urine ratio; Future -     Comprehensive metabolic panel with GFR; Future  Hyperlipidemia,  unspecified hyperlipidemia type Assessment & Plan: Last LDL 55 in 11/2023. Continue rosuvastatin  20 mg daily. Low-fat diet also recommended. Further recommendation will be given according to lipid panel result.  Orders: -     Lipid panel; Future  Essential hypertension Assessment & Plan: BP today otherwise adequately controlled.  Reporting SBP's 140s at home but he he believes his BP monitor is not accurate. For now continue amlodipine  10 mg daily, irbesartan  300 mg daily, and metoprolol  succinate 50 mg daily. Low-salt diet also recommended. Instructed to bring BP monitor to the office next time he has an appointment. We discussed possible complications of elevated BP.  Orders: -     Comprehensive metabolic panel with GFR; Future  Morbid obesity with BMI of 40.0-44.9, adult [facility]) Assessment & Plan: Weight has been otherwise stable. Encouraged consistency with following a healthful diet and engaging in regular physical activity.  Due to chronic pain, exercise can be challenging, recommend walking and plays while watching TV during commercials.   Drug dependence, in remission (HCC)  Problem was present during teenager years, has not used any drug since then.  I  personally spent a total of 42 minutes in the care of the patient today including preparing to see the patient, getting/reviewing separately obtained history, performing a medically appropriate exam/evaluation, counseling and educating, placing orders, and documenting clinical information in the EHR.  Return in about 4 months (around 03/06/2025) for chronic problems.  Kimora Stankovic, MD [facility]. [place] office.

## 2024-11-07 NOTE — Assessment & Plan Note (Signed)
 Last LDL 55 in 11/2023. Continue rosuvastatin  20 mg daily. Low-fat diet also recommended. Further recommendation will be given according to lipid panel result.

## 2024-11-09 ENCOUNTER — Ambulatory Visit: Payer: Self-pay | Admitting: Family Medicine

## 2024-11-09 ENCOUNTER — Encounter: Payer: Self-pay | Admitting: Family Medicine

## 2024-11-13 ENCOUNTER — Telehealth: Payer: Self-pay

## 2024-11-13 NOTE — Progress Notes (Signed)
   11/13/2024  Patient ID: Charles [name], male   DOB: 04-09-1951, 73 y.o.   MRN: [id number]  Received notification from [facility] that patient's Jardiance  PAP has been renewed through 12/20/25.  Charles [name], PharmD Clinical Pharmacist [phone]

## 2024-11-29 ENCOUNTER — Encounter: Payer: Self-pay | Admitting: Cardiology

## 2024-12-28 ENCOUNTER — Other Ambulatory Visit: Payer: Self-pay | Admitting: Family Medicine

## 2024-12-28 DIAGNOSIS — E785 Hyperlipidemia, unspecified: Secondary | ICD-10-CM

## 2024-12-28 DIAGNOSIS — I10 Essential (primary) hypertension: Secondary | ICD-10-CM
